# Patient Record
Sex: Male | Born: 1965
Health system: Southern US, Community
[De-identification: ages and names within clinical notes are randomized; demographics above are authoritative.]

## PROBLEM LIST (undated history)

## (undated) DIAGNOSIS — I517 Cardiomegaly: Secondary | ICD-10-CM

## (undated) DIAGNOSIS — I499 Cardiac arrhythmia, unspecified: Secondary | ICD-10-CM

## (undated) DIAGNOSIS — E785 Hyperlipidemia, unspecified: Secondary | ICD-10-CM

## (undated) DIAGNOSIS — K219 Gastro-esophageal reflux disease without esophagitis: Secondary | ICD-10-CM

## (undated) DIAGNOSIS — G4733 Obstructive sleep apnea (adult) (pediatric): Secondary | ICD-10-CM

## (undated) DIAGNOSIS — R7611 Nonspecific reaction to tuberculin skin test without active tuberculosis: Secondary | ICD-10-CM

## (undated) DIAGNOSIS — T7840XA Allergy, unspecified, initial encounter: Secondary | ICD-10-CM

## (undated) DIAGNOSIS — I10 Essential (primary) hypertension: Secondary | ICD-10-CM

## (undated) DIAGNOSIS — D649 Anemia, unspecified: Secondary | ICD-10-CM

## (undated) DIAGNOSIS — J069 Acute upper respiratory infection, unspecified: Secondary | ICD-10-CM

## (undated) DIAGNOSIS — I639 Cerebral infarction, unspecified: Secondary | ICD-10-CM

## (undated) DIAGNOSIS — E669 Obesity, unspecified: Secondary | ICD-10-CM

## (undated) DIAGNOSIS — K2960 Other gastritis without bleeding: Secondary | ICD-10-CM

## (undated) DIAGNOSIS — R51 Headache: Secondary | ICD-10-CM

## (undated) DIAGNOSIS — N289 Disorder of kidney and ureter, unspecified: Secondary | ICD-10-CM

## (undated) DIAGNOSIS — G4730 Sleep apnea, unspecified: Secondary | ICD-10-CM

## (undated) HISTORY — DX: Obesity, unspecified: E66.9

## (undated) HISTORY — PX: UPPER GASTROINTESTINAL ENDOSCOPY: SHX188

## (undated) HISTORY — DX: Gastro-esophageal reflux disease without esophagitis: K21.9

## (undated) HISTORY — DX: Acute upper respiratory infection, unspecified: J06.9

## (undated) HISTORY — DX: Obstructive sleep apnea (adult) (pediatric): G47.33

## (undated) HISTORY — DX: Disorder of kidney and ureter, unspecified: N28.9

## (undated) HISTORY — DX: Hyperlipidemia, unspecified: E78.5

## (undated) HISTORY — PX: HEMORRHOID SURGERY: SHX153

## (undated) HISTORY — DX: Other gastritis without bleeding: K29.60

## (undated) HISTORY — DX: Nonspecific reaction to tuberculin skin test without active tuberculosis: R76.11

## (undated) HISTORY — DX: Anemia, unspecified: D64.9

## (undated) HISTORY — DX: Essential (primary) hypertension: I10

## (undated) HISTORY — DX: Sleep apnea, unspecified: G47.30

## (undated) HISTORY — DX: Cardiomegaly: I51.7

## (undated) HISTORY — DX: Allergy, unspecified, initial encounter: T78.40XA

## (undated) HISTORY — PX: COLONOSCOPY: SHX174

---

## 1998-06-19 DIAGNOSIS — R7611 Nonspecific reaction to tuberculin skin test without active tuberculosis: Secondary | ICD-10-CM

## 1998-06-19 HISTORY — DX: Nonspecific reaction to tuberculin skin test without active tuberculosis: R76.11

## 2000-06-11 ENCOUNTER — Emergency Department (HOSPITAL_COMMUNITY): Admission: EM | Admit: 2000-06-11 | Discharge: 2000-06-11 | Payer: Self-pay | Admitting: Emergency Medicine

## 2000-08-06 ENCOUNTER — Emergency Department (HOSPITAL_COMMUNITY): Admission: EM | Admit: 2000-08-06 | Discharge: 2000-08-06 | Payer: Self-pay | Admitting: Emergency Medicine

## 2000-08-07 ENCOUNTER — Encounter: Payer: Self-pay | Admitting: Emergency Medicine

## 2002-03-23 ENCOUNTER — Emergency Department (HOSPITAL_COMMUNITY): Admission: EM | Admit: 2002-03-23 | Discharge: 2002-03-23 | Payer: Self-pay | Admitting: Emergency Medicine

## 2004-08-20 ENCOUNTER — Emergency Department (HOSPITAL_COMMUNITY): Admission: EM | Admit: 2004-08-20 | Discharge: 2004-08-20 | Payer: Self-pay | Admitting: Emergency Medicine

## 2005-01-25 ENCOUNTER — Encounter: Payer: Self-pay | Admitting: Internal Medicine

## 2007-11-12 ENCOUNTER — Encounter: Payer: Self-pay | Admitting: Internal Medicine

## 2007-12-13 ENCOUNTER — Ambulatory Visit: Payer: Self-pay | Admitting: Internal Medicine

## 2007-12-13 DIAGNOSIS — D509 Iron deficiency anemia, unspecified: Secondary | ICD-10-CM | POA: Insufficient documentation

## 2007-12-13 DIAGNOSIS — K648 Other hemorrhoids: Secondary | ICD-10-CM | POA: Insufficient documentation

## 2007-12-13 DIAGNOSIS — K625 Hemorrhage of anus and rectum: Secondary | ICD-10-CM | POA: Insufficient documentation

## 2007-12-13 DIAGNOSIS — K644 Residual hemorrhoidal skin tags: Secondary | ICD-10-CM | POA: Insufficient documentation

## 2007-12-17 ENCOUNTER — Telehealth: Payer: Self-pay | Admitting: Internal Medicine

## 2008-01-28 ENCOUNTER — Ambulatory Visit: Payer: Self-pay | Admitting: Internal Medicine

## 2008-03-02 ENCOUNTER — Ambulatory Visit: Payer: Self-pay | Admitting: Internal Medicine

## 2008-03-02 DIAGNOSIS — I10 Essential (primary) hypertension: Secondary | ICD-10-CM | POA: Insufficient documentation

## 2008-03-02 DIAGNOSIS — K222 Esophageal obstruction: Secondary | ICD-10-CM

## 2008-03-02 DIAGNOSIS — K219 Gastro-esophageal reflux disease without esophagitis: Secondary | ICD-10-CM

## 2010-01-31 ENCOUNTER — Ambulatory Visit: Payer: Self-pay | Admitting: Hematology & Oncology

## 2010-06-18 ENCOUNTER — Emergency Department (HOSPITAL_COMMUNITY)
Admission: EM | Admit: 2010-06-18 | Discharge: 2010-06-19 | Payer: Self-pay | Source: Home / Self Care | Admitting: Emergency Medicine

## 2010-08-29 LAB — POCT I-STAT, CHEM 8
BUN: 10 mg/dL (ref 6–23)
Calcium, Ion: 1.17 mmol/L (ref 1.12–1.32)
Chloride: 103 mEq/L (ref 96–112)
Creatinine, Ser: 1.5 mg/dL (ref 0.4–1.5)
Glucose, Bld: 91 mg/dL (ref 70–99)
HCT: 38 % — ABNORMAL LOW (ref 39.0–52.0)
Hemoglobin: 12.9 g/dL — ABNORMAL LOW (ref 13.0–17.0)
Potassium: 3.8 mEq/L (ref 3.5–5.1)
Sodium: 138 mEq/L (ref 135–145)
TCO2: 30 mmol/L (ref 0–100)

## 2010-11-04 ENCOUNTER — Emergency Department (HOSPITAL_COMMUNITY)
Admission: EM | Admit: 2010-11-04 | Discharge: 2010-11-05 | Disposition: A | Discharge status: Home / Self Care | Payer: BC Managed Care – PPO | Attending: Emergency Medicine | Admitting: Emergency Medicine

## 2010-11-04 DIAGNOSIS — M549 Dorsalgia, unspecified: Secondary | ICD-10-CM | POA: Insufficient documentation

## 2010-11-04 DIAGNOSIS — I10 Essential (primary) hypertension: Secondary | ICD-10-CM | POA: Insufficient documentation

## 2010-11-04 LAB — URINALYSIS, ROUTINE W REFLEX MICROSCOPIC
Bilirubin Urine: NEGATIVE
Hgb urine dipstick: NEGATIVE
Protein, ur: NEGATIVE mg/dL
Urobilinogen, UA: 0.2 mg/dL (ref 0.0–1.0)

## 2011-07-12 ENCOUNTER — Inpatient Hospital Stay: Admit: 2011-07-12 | Payer: Self-pay | Admitting: Otolaryngology

## 2011-07-12 SURGERY — UPPP (UVULOPALATOPHARYNGOPLASTY)
Anesthesia: General

## 2011-08-25 ENCOUNTER — Ambulatory Visit (INDEPENDENT_AMBULATORY_CARE_PROVIDER_SITE_OTHER): Payer: 59 | Admitting: Family Medicine

## 2011-08-25 VITALS — BP 173/92 | HR 51 | Temp 98.3°F | Resp 18 | Ht 71.75 in | Wt 254.8 lb

## 2011-08-25 DIAGNOSIS — D649 Anemia, unspecified: Secondary | ICD-10-CM

## 2011-08-25 DIAGNOSIS — Q649 Congenital malformation of urinary system, unspecified: Secondary | ICD-10-CM

## 2011-08-25 DIAGNOSIS — E785 Hyperlipidemia, unspecified: Secondary | ICD-10-CM

## 2011-08-25 DIAGNOSIS — I10 Essential (primary) hypertension: Secondary | ICD-10-CM

## 2011-08-25 LAB — POCT URINALYSIS DIPSTICK
Bilirubin, UA: NEGATIVE
Blood, UA: NEGATIVE
Glucose, UA: NEGATIVE
Ketones, UA: NEGATIVE
Leukocytes, UA: NEGATIVE
Nitrite, UA: NEGATIVE
Protein, UA: NEGATIVE
Spec Grav, UA: 1.015
Urobilinogen, UA: 0.2
pH, UA: 7

## 2011-08-25 LAB — COMPREHENSIVE METABOLIC PANEL WITH GFR
Albumin: 4.4 g/dL (ref 3.5–5.2)
Alkaline Phosphatase: 47 U/L (ref 39–117)
BUN: 15 mg/dL (ref 6–23)
CO2: 32 meq/L (ref 19–32)
Calcium: 10.6 mg/dL — ABNORMAL HIGH (ref 8.4–10.5)
Chloride: 100 meq/L (ref 96–112)
Glucose, Bld: 93 mg/dL (ref 70–99)
Potassium: 4.9 meq/L (ref 3.5–5.3)
Sodium: 137 meq/L (ref 135–145)
Total Protein: 7.4 g/dL (ref 6.0–8.3)

## 2011-08-25 LAB — COMPREHENSIVE METABOLIC PANEL
ALT: 17 U/L (ref 0–53)
AST: 20 U/L (ref 0–37)
Creat: 1.45 mg/dL — ABNORMAL HIGH (ref 0.50–1.35)
Total Bilirubin: 0.6 mg/dL (ref 0.3–1.2)

## 2011-08-25 LAB — POCT UA - MICROSCOPIC ONLY
Bacteria, U Microscopic: NEGATIVE
Casts, Ur, LPF, POC: NEGATIVE
Crystals, Ur, HPF, POC: NEGATIVE
Mucus, UA: NEGATIVE
WBC, Ur, HPF, POC: NEGATIVE
Yeast, UA: NEGATIVE

## 2011-08-25 LAB — LIPID PANEL
Cholesterol: 214 mg/dL — ABNORMAL HIGH (ref 0–200)
HDL: 45 mg/dL (ref >39)
LDL Cholesterol: 133 mg/dL — ABNORMAL HIGH (ref 0–99)
Total CHOL/HDL Ratio: 4.8 Ratio
Triglycerides: 178 mg/dL — ABNORMAL HIGH (ref <150)
VLDL: 36 mg/dL (ref 0–40)

## 2011-08-25 MED ORDER — HYDROCHLOROTHIAZIDE 12.5 MG PO CAPS: 25.0000 mg | ORAL_CAPSULE | Freq: Every day | ORAL | Status: DC

## 2011-08-25 MED ORDER — METOPROLOL TARTRATE 25 MG PO TABS: 100.0000 mg | ORAL_TABLET | Freq: Two times a day (BID) | ORAL | Status: DC

## 2011-08-25 NOTE — Progress Notes (Signed)
[facility]:  Office Visit  Chief Complaint:  Chief Complaint  Patient presents with  . Medication Check    Pt feels HTN medication is not working    HPI: Raymond Green is a 46 y.o. male who complains of  Medication refills/check. Patient thinks he is on Metoprolol 25 mg  And HCTZ 12.5 mg daily. BP at home is 150/100-110. No SEs.  He also here b/c urine has had foam in it and has bee bright yelllow color in last 1 week. NO UTI sxs.   Past Medical History  Diagnosis Date  . Hypertension   . OSA (obstructive sleep apnea)   . Anemia   . Positive PPD, treated 2000    INH  . Obesity   . Hemorrhoids   . LVH (left ventricular hypertrophy)     Dr. Donnie Aho ( cardiology)  . Kidney insufficiency   . Vitamin d deficiency    History reviewed. No pertinent past surgical history. History   Social History  . Marital Status: Legally Separated    Spouse Name: N/A    Number of Children: N/A  . Years of Education: N/A   Social History Main Topics  . Smoking status: Never Smoker   . Smokeless tobacco: None  . Alcohol Use: Yes     Sometimes  . Drug Use: No  . Sexually Active: Yes   Other Topics Concern  . None   Social History Narrative  . None   Family History  Problem Relation Age of Onset  . Diabetes Mother   . Heart disease Mother   . Hypertension Mother    Allergies  Allergen Reactions  . Lisinopril    Prior to Admission medications   Medication Sig Start Date End Date Taking? Authorizing Provider  calcium-vitamin D 250-100 MG-UNIT per tablet Take 1 tablet by mouth 2 (two) times daily.   Yes Historical Provider, MD  docusate sodium (COLACE) 100 MG capsule Take 100 mg by mouth 2 (two) times daily.   Yes Historical Provider, MD  ferrous gluconate (FERGON) 325 MG tablet Take 325 mg by mouth daily with breakfast.   Yes Historical Provider, MD  hydrochlorothiazide (MICROZIDE) 12.5 MG capsule Take 12.5 mg by mouth daily.   Yes Historical Provider, MD    metoprolol tartrate (LOPRESSOR) 25 MG tablet Take 25 mg by mouth 2 (two) times daily.   Yes Historical Provider, MD     ROS: The patient denies fevers, chills, night sweats, unintentional weight loss, chest pain, palpitations, wheezing, dyspnea on exertion, nausea, vomiting, abdominal pain, dysuria, hematuria, melena, numbness, weakness, or tingling.   All other systems have been reviewed and were otherwise negative with the exception of those mentioned in the HPI and as above.    PHYSICAL EXAM: Filed Vitals:   08/25/11 1010  BP: 173/92  Pulse: 51  Temp: 98.3 F (36.8 C)  Resp: 18   Filed Vitals:   08/25/11 1010  Height: 5' 11.75" (1.822 m)  Weight: 254 lb 12.8 oz (115.577 kg)   Body mass index is 34.80 kg/(m^2).  General: Alert, no acute distress HEENT:  Normocephalic, atraumatic, oropharynx patent.  Cardiovascular:  Regular rate and rhythm, no rubs murmurs or gallops.  No Carotid bruits, radial pulse intact. No pedal edema.  Respiratory: Clear to auscultation bilaterally.  No wheezes, rales, or rhonchi.  No cyanosis, no use of accessory musculature GI: No organomegaly, abdomen is soft and non-tender, positive bowel sounds.  No masses. Skin: No rashes. Neurologic: Facial musculature  symmetric. Psychiatric: Patient is appropriate throughout our interaction. Lymphatic: No cervical lymphadenopathy Musculoskeletal: Gait intact. No CVA tenderness   LABS: Results for orders placed in visit on 08/25/11  POCT URINALYSIS DIPSTICK      Component Value Range   Color, UA yellow     Clarity, UA clear     Glucose, UA negative     Bilirubin, UA negative     Ketones, UA negative     Spec Grav, UA 1.015     Blood, UA negative     pH, UA 7.0     Protein, UA negative     Urobilinogen, UA 0.2     Nitrite, UA negative     Leukocytes, UA Negative    POCT UA - MICROSCOPIC ONLY      Component Value Range   WBC, Ur, HPF, POC negative     RBC, urine, microscopic rare     Bacteria, U  Microscopic negative     Mucus, UA negative     Epithelial cells, urine per micros 0-1     Crystals, Ur, HPF, POC negative     Casts, Ur, LPF, POC negative     Yeast, UA negative       EKG/XRAY:   Primary read interpreted by Dr. Conley Rolls at [facility]   ASSESSMENT/PLAN: Encounter Diagnoses  Name Primary?  . HTN (hypertension) Yes  . Hyperlipidemia   . Urinary anomaly   . Anemia    Changed HTN meds to Metroprolo ER 100 mg BID and also refilled HCTZ 25 mg daily.  UA was normal. Lipid pending.  Check BP and pulse next 2weeks. IF > 140/90 need to f/u sooner. Otherwise f/u in 3 months    Jasmia Angst PHUONG, DO 08/25/2011 11:46 AM

## 2011-08-31 ENCOUNTER — Other Ambulatory Visit: Payer: Self-pay | Admitting: *Deleted

## 2011-08-31 MED ORDER — HYDROCHLOROTHIAZIDE 25 MG PO TABS: 25.0000 mg | ORAL_TABLET | Freq: Every day | ORAL | Status: DC

## 2011-08-31 MED ORDER — METOPROLOL SUCCINATE ER 100 MG PO TB24: 100.0000 mg | ORAL_TABLET | Freq: Every day | ORAL | Status: DC

## 2011-09-27 ENCOUNTER — Encounter (HOSPITAL_COMMUNITY)
Admission: RE | Admit: 2011-09-27 | Discharge: 2011-09-27 | Disposition: A | Discharge status: Home / Self Care | Payer: 59 | Source: Ambulatory Visit | Attending: Anesthesiology | Admitting: Anesthesiology

## 2011-09-27 ENCOUNTER — Encounter (HOSPITAL_COMMUNITY): Payer: Self-pay

## 2011-09-27 ENCOUNTER — Encounter (HOSPITAL_COMMUNITY)
Admission: RE | Admit: 2011-09-27 | Discharge: 2011-09-27 | Disposition: A | Discharge status: Home / Self Care | Payer: 59 | Source: Ambulatory Visit | Attending: Otolaryngology | Admitting: Otolaryngology

## 2011-09-27 ENCOUNTER — Other Ambulatory Visit: Payer: Self-pay | Admitting: Otolaryngology

## 2011-09-27 DIAGNOSIS — Z01810 Encounter for preprocedural cardiovascular examination: Secondary | ICD-10-CM | POA: Insufficient documentation

## 2011-09-27 DIAGNOSIS — Z01812 Encounter for preprocedural laboratory examination: Secondary | ICD-10-CM | POA: Insufficient documentation

## 2011-09-27 DIAGNOSIS — Z538 Procedure and treatment not carried out for other reasons: Secondary | ICD-10-CM | POA: Insufficient documentation

## 2011-09-27 DIAGNOSIS — Z01818 Encounter for other preprocedural examination: Secondary | ICD-10-CM | POA: Insufficient documentation

## 2011-09-27 HISTORY — DX: Headache: R51

## 2011-09-27 LAB — CBC
MCHC: 34.2 g/dL (ref 30.0–36.0)
MCV: 87.5 fL (ref 78.0–100.0)
Platelets: 168 10*3/uL (ref 150–400)
RDW: 12.8 % (ref 11.5–15.5)
WBC: 4.7 10*3/uL (ref 4.0–10.5)

## 2011-09-27 LAB — SURGICAL PCR SCREEN
MRSA, PCR: NEGATIVE
Staphylococcus aureus: NEGATIVE

## 2011-09-27 LAB — BASIC METABOLIC PANEL
CO2: 30 mEq/L (ref 19–32)
Calcium: 10 mg/dL (ref 8.4–10.5)
Creatinine, Ser: 1.46 mg/dL — ABNORMAL HIGH (ref 0.50–1.35)
GFR calc Af Amer: 65 mL/min — ABNORMAL LOW (ref >90)

## 2011-09-27 NOTE — Pre-Procedure Instructions (Addendum)
20 Raymond Green  09/27/2011   Your procedure is scheduled on:  October 04, 2011 (WED)  Report to [facility] at 6:30 AM.  Call this number if you have problems the morning of surgery: [phone]   Remember:   Do not eat food:After Midnight.  May have clear liquids: up to 4 Hours before arrival.  Clear liquids include soda, tea, black coffee, apple or grape juice, broth.  Take these medicines the morning of surgery with A SIP OF WATER: TOPROL 100MG , LOPRESSOR 25MG    Do not wear jewelry, make-up or nail polish.  Do not wear lotions, powders, or perfumes. You may wear deodorant.  Do not shave 48 hours prior to surgery.  Do not bring valuables to the hospital.  Contacts, dentures or bridgework may not be worn into surgery.  Leave suitcase in the car. After surgery it may be brought to your room.  For patients admitted to the hospital, checkout time is 11:00 AM the day of discharge.   Patients discharged the day of surgery will not be allowed to drive home.  Name and phone number of your driver: [name] [phone], [name] [phone]  Special Instructions: CHG Shower Use Special Wash: 1/2 bottle night before surgery and 1/2 bottle morning of surgery.   Please read over the following fact sheets that you were given: Pain Booklet, MRSA Information and Surgical Site Infection Prevention

## 2011-09-27 NOTE — Progress Notes (Addendum)
Called for orders, nurse not in until 0830 or 0900 per April in office, PAT @ 0800.  Labs drawn per anesthesia

## 2011-09-29 ENCOUNTER — Encounter (HOSPITAL_COMMUNITY): Payer: Self-pay | Admitting: Vascular Surgery

## 2011-09-29 NOTE — Consult Note (Addendum)
Anesthesia:  Patient is a 46 year old male posted for a UPPP, septoplasty, tonsillectomy on 10/04/11.  History includes OSA, HTN, anemia, non-smoker, +PDD s/p treatment, CRI, LVH, headaches.  In Epic, his medical visit was with Dr. Hamilton Capri on 3//03/13 for HTN follow-up.  His BP was 173/92 then and his medications were adjusted.  He was instructed to follow-up if his BP were running > 140/90.  His BP at PAT on 09/27/11 was 158/104.    Medications listed are Ca with VIt D, Colace, Fergon, HCTZ 25 mg daily, and metoprolol 100 mg BID.  EKG on 09/27/11 shows SB at 55 bpm, LVH with repolarization abnormality showing T wave inversion in inferior-lateral leads.  The interpreting Cardiologist felt it was not significantly changes, but I do not see any previous EKGs in [place] or Epic.   He was last evaluated by Dr. Viann Fish on 07/22/09 for HTN.  His EKG there done on 06/25/08 looks similar to his EKG done on 09/27/11.  Echo on 07/08/08 showed: Concentric LVH, EF 45-50%, LV function mildly depressed, LA mildly dilated, mild MR/TR.    CXR on 09/27/11 showed no acute cardiopulmonary disease.   Labs noted.  Cr 1.46 (was 1.45 on 08/25/11), CBC WNL.  Glucose 101.  I have left a message for him on his primary phone to call me.  I think he needs to be reevaluated by his PCP preoperatively since his BP appears to still not be well controlled.   Addendum:  09/29/11 1200    I have still not heard from Raymond Green.  He was not at work today.  I did review his Cardiac records and EKG with Anesthesiologist Dr. Jean Rosenthal.  If he is asymptomatic, then she does not feel he would need Cardiac evaluation preoperatively for this procedure.  He will need better BP control prior to proceeding, however.  Once I speak with him, hopefully, he can arrange to see his PCP early next week. Mandy at Dr. Thurmon Fair office updated.   Addendum: 09/29/11 1830  I was finally able to reach Raymond Green.  He reports his PCP is Dr. Robert Bellow, and had  [facility]s [facility].  He will call his PCP office on Monday regarding having his BP re-evaluated.  Of note, he did say that he had forgotten to take his BP medications prior to his PAT appointment.  Addendum: 10/03/11 1045  Left a message for Raymond Green to call me with an update.  However, noted he was seen by a Dr. Milus Glazier at [facility] on 09/30/11.  BP then was 158/94.  His HR was bradycardic at 49.  His BP regimen was changed to Bystolic 20mg  daily.  As before, he had not taken any antihypertensive meds before his PAT appointment, and his follow-up BP on 09/30/11 was reasonable. Dr. Milus Glazier also provided medical clearance.  Anticipate he can proceed.  I updated Mandy at Dr. Thurmon Fair office.

## 2011-09-30 ENCOUNTER — Ambulatory Visit (INDEPENDENT_AMBULATORY_CARE_PROVIDER_SITE_OTHER): Payer: 59 | Admitting: Family Medicine

## 2011-09-30 VITALS — BP 158/94 | HR 49 | Temp 98.2°F | Resp 16 | Ht 72.0 in | Wt 260.0 lb

## 2011-09-30 DIAGNOSIS — Z01818 Encounter for other preprocedural examination: Secondary | ICD-10-CM

## 2011-09-30 DIAGNOSIS — I10 Essential (primary) hypertension: Secondary | ICD-10-CM

## 2011-09-30 MED ORDER — NEBIVOLOL HCL 20 MG PO TABS: 20.0000 mg | ORAL_TABLET | Freq: Every morning | ORAL | Status: DC

## 2011-09-30 NOTE — Progress Notes (Addendum)
46 yo [employer] employee who works with Dr. Leone Payor and plans tonsillectomy with Dr. Annalee Genta.  No active problems, but blood pressure never was controlled adequately with Metoprolol which we tried because of price.  ROS:  No headache, vision prob, sinus congestion, active sorethroat, dysphagia, neck pain, chest pain or SOB, abdominal pain, back pain, difficulty passing water, weakness.  PMHx:  S/P hemorrhoidectomy   No allergies   Hypertension  O:  NAD, friendly and cooperative, alert and showing good judgment HEENT:  Enlarged tonsils, nosae patent, ears normal Neck supple no adenopathy Chest:  Clear Heart: reg, no murmur or gallop Abd:  Soft, no HSM, nontender, no masses Ext: no edema, FROM x 4 extrem, gait normal Results for orders placed during the hospital encounter of 09/27/11  SURGICAL PCR SCREEN      Component Value Range   MRSA, PCR NEGATIVE  NEGATIVE    Staphylococcus aureus NEGATIVE  NEGATIVE   BASIC METABOLIC PANEL      Component Value Range   Sodium 142  135 - 145 (mEq/L)   Potassium 3.8  3.5 - 5.1 (mEq/L)   Chloride 104  96 - 112 (mEq/L)   CO2 30  19 - 32 (mEq/L)   Glucose, Bld 101 (*) 70 - 99 (mg/dL)   BUN 16  6 - 23 (mg/dL)   Creatinine, Ser 1.61 (*) 0.50 - 1.35 (mg/dL)   Calcium 09.6  8.4 - 10.5 (mg/dL)   GFR calc non Af Amer 56 (*) >90 (mL/min)   GFR calc Af Amer 65 (*) >90 (mL/min)  CBC      Component Value Range   WBC 4.7  4.0 - 10.5 (K/uL)   RBC 4.65  4.22 - 5.81 (MIL/uL)   Hemoglobin 13.9  13.0 - 17.0 (g/dL)   HCT 04.5  40.9 - 81.1 (%)   MCV 87.5  78.0 - 100.0 (fL)   MCH 29.9  26.0 - 34.0 (pg)   MCHC 34.2  30.0 - 36.0 (g/dL)   RDW 91.4  78.2 - 95.6 (%)   Platelets 168  150 - 400 (K/uL)     A: cleared for surgery Slightly elevated creatinine of uncertain significance which we will check in 6 months  P: switch to bystolic 20 qd

## 2011-10-03 MED ORDER — DEXAMETHASONE SODIUM PHOSPHATE 10 MG/ML IJ SOLN: 10.0000 mg | Freq: Once | INTRAMUSCULAR | Status: DC

## 2011-10-03 MED ORDER — CEFAZOLIN SODIUM-DEXTROSE 2-3 GM-% IV SOLR: 2.0000 g | INTRAVENOUS | Status: DC

## 2011-10-04 ENCOUNTER — Encounter (HOSPITAL_COMMUNITY): Admission: RE | Payer: Self-pay | Source: Ambulatory Visit

## 2011-10-04 ENCOUNTER — Ambulatory Visit (HOSPITAL_COMMUNITY): Admission: RE | Admit: 2011-10-04 | Payer: 59 | Source: Ambulatory Visit | Admitting: Otolaryngology

## 2011-10-04 SURGERY — UPPP (UVULOPALATOPHARYNGOPLASTY)
Anesthesia: General

## 2011-11-20 ENCOUNTER — Ambulatory Visit (INDEPENDENT_AMBULATORY_CARE_PROVIDER_SITE_OTHER): Payer: 59 | Admitting: Family Medicine

## 2011-11-20 ENCOUNTER — Ambulatory Visit: Payer: 59 | Admitting: Internal Medicine

## 2011-11-20 VITALS — BP 166/103 | HR 64 | Temp 99.0°F | Resp 16 | Ht 71.5 in | Wt 263.0 lb

## 2011-11-20 DIAGNOSIS — M109 Gout, unspecified: Secondary | ICD-10-CM

## 2011-11-20 DIAGNOSIS — H109 Unspecified conjunctivitis: Secondary | ICD-10-CM

## 2011-11-20 DIAGNOSIS — I10 Essential (primary) hypertension: Secondary | ICD-10-CM

## 2011-11-20 MED ORDER — INDOMETHACIN ER 75 MG PO CPCR: 75.0000 mg | ORAL_CAPSULE | Freq: Two times a day (BID) | ORAL | Status: AC

## 2011-11-20 MED ORDER — TOBRAMYCIN 0.3 % OP SOLN: 1.0000 [drp] | OPHTHALMIC | Status: AC

## 2011-11-20 MED ORDER — AMLODIPINE BESYLATE 10 MG PO TABS: 10.0000 mg | ORAL_TABLET | Freq: Every day | ORAL | Status: DC

## 2011-11-20 NOTE — Progress Notes (Signed)
Is a 46 year old gentleman who works at: With Dr. Leone Payor. He comes in with several problems: High blood pressure, left great toe pain, and right pink eye.  He's had high blood pressure for a while and he thinks that the hydrochlorothiazide is drying his mouth out. He continues to take his bystolic.  His left great toe pain cervical last couple days it has gotten worse. Swollen and tender to touch or walk on. He has no history of gout.  He has 2 days of right sticky red eye. He has no change in his vision but he does have some mild discharge or film over his eye. Objective: No acute distress-patient has antalgic gait. Left toe: Swollen tender left great toe and TP joint. Right eye: Normal fundus, normal EOM: Physical and reactive: Injected right sclera with scant exudate at the lid margins of the right eye. Blood pressure recheck 160/110, chest clear, heart regular no murmur or gallop. There is no edema of the extremities.  Assessment: Conjunctivitis, gout, uncontrolled blood pressure  Plan: Check uric acid and metabolic profile Add Norvasc and stop the hydrochlorothiazide for blood pressure Indocin for gout.

## 2011-11-20 NOTE — Patient Instructions (Signed)
Gout Gout is an inflammatory condition (arthritis) caused by a buildup of uric acid crystals in the joints. Uric acid is a chemical that is normally present in the blood. Under some circumstances, uric acid can form into crystals in your joints. This causes joint redness, soreness, and swelling (inflammation). Repeat attacks are common. Over time, uric acid crystals can form into masses (tophi) near a joint, causing disfigurement. Gout is treatable and often preventable. CAUSES  The disease begins with elevated levels of uric acid in the blood. Uric acid is produced by your body when it breaks down a naturally found substance called purines. This also happens when you eat certain foods such as meats and fish. Causes of an elevated uric acid level include:  Being passed down from parent to child (heredity).   Diseases that cause increased uric acid production (obesity, psoriasis, some cancers).   Excessive alcohol use.   Diet, especially diets rich in meat and seafood.   Medicines, including certain cancer-fighting drugs (chemotherapy), diuretics, and aspirin.   Chronic kidney disease. The kidneys are no longer able to remove uric acid well.   Problems with metabolism.  Conditions strongly associated with gout include:  Obesity.   High blood pressure.   High cholesterol.   Diabetes.  Not everyone with elevated uric acid levels gets gout. It is not understood why some people get gout and others do not. Surgery, joint injury, and eating too much of certain foods are some of the factors that can lead to gout. SYMPTOMS   An attack of gout comes on quickly. It causes intense pain with redness, swelling, and warmth in a joint.   Fever can occur.   Often, only one joint is involved. Certain joints are more commonly involved:   Base of the big toe.   Knee.   Ankle.   Wrist.   Finger.  Without treatment, an attack usually goes away in a few days to weeks. Between attacks, you  usually will not have symptoms, which is different from many other forms of arthritis. DIAGNOSIS  Your caregiver will suspect gout based on your symptoms and exam. Removal of fluid from the joint (arthrocentesis) is done to check for uric acid crystals. Your caregiver will give you a medicine that numbs the area (local anesthetic) and use a needle to remove joint fluid for exam. Gout is confirmed when uric acid crystals are seen in joint fluid, using a special microscope. Sometimes, blood, urine, and X-ray tests are also used. TREATMENT  There are 2 phases to gout treatment: treating the sudden onset (acute) attack and preventing attacks (prophylaxis). Treatment of an Acute Attack  Medicines are used. These include anti-inflammatory medicines or steroid medicines.   An injection of steroid medicine into the affected joint is sometimes necessary.   The painful joint is rested. Movement can worsen the arthritis.   You may use warm or cold treatments on painful joints, depending which works best for you.   Discuss the use of coffee, vitamin C, or cherries with your caregiver. These may be helpful treatment options.  Treatment to Prevent Attacks After the acute attack subsides, your caregiver may advise prophylactic medicine. These medicines either help your kidneys eliminate uric acid from your body or decrease your uric acid production. You may need to stay on these medicines for a very long time. The early phase of treatment with prophylactic medicine can be associated with an increase in acute gout attacks. For this reason, during the first few months   of treatment, your caregiver may also advise you to take medicines usually used for acute gout treatment. Be sure you understand your caregiver's directions. You should also discuss dietary treatment with your caregiver. Certain foods such as meats and fish can increase uric acid levels. Other foods such as dairy can decrease levels. Your caregiver  can give you a list of foods to avoid. HOME CARE INSTRUCTIONS   Do not take aspirin to relieve pain. This raises uric acid levels.   Only take over-the-counter or prescription medicines for pain, discomfort, or fever as directed by your caregiver.   Rest the joint as much as possible. When in bed, keep sheets and blankets off painful areas.   Keep the affected joint raised (elevated).   Use crutches if the painful joint is in your leg.   Drink enough water and fluids to keep your urine clear or pale yellow. This helps your body get rid of uric acid. Do not drink alcoholic beverages. They slow the passage of uric acid.   Follow your caregiver's dietary instructions. Pay careful attention to the amount of protein you eat. Your daily diet should emphasize fruits, vegetables, whole grains, and fat-free or low-fat milk products.   Maintain a healthy body weight.  SEEK MEDICAL CARE IF:   You have an oral temperature above 102 F (38.9 C).   You develop diarrhea, vomiting, or any side effects from medicines.   You do not feel better in 24 hours, or you are getting worse.  SEEK IMMEDIATE MEDICAL CARE IF:   Your joint becomes suddenly more tender and you have:   Chills.   An oral temperature above 102 F (38.9 C), not controlled by medicine.  MAKE SURE YOU:   Understand these instructions.   Will watch your condition.   Will get help right away if you are not doing well or get worse.  Document Released: 06/02/2000 Document Revised: 05/25/2011 Document Reviewed: 09/13/2009 Exit[employer] Patient Information 2012 [employer]. 

## 2011-11-21 LAB — COMPREHENSIVE METABOLIC PANEL
ALT: 41 U/L (ref 0–53)
AST: 35 U/L (ref 0–37)
Albumin: 4 g/dL (ref 3.5–5.2)
Alkaline Phosphatase: 60 U/L (ref 39–117)
BUN: 16 mg/dL (ref 6–23)
CO2: 28 mEq/L (ref 19–32)
Calcium: 9.7 mg/dL (ref 8.4–10.5)
Chloride: 105 mEq/L (ref 96–112)
Creat: 1.29 mg/dL (ref 0.50–1.35)
Glucose, Bld: 87 mg/dL (ref 70–99)
Potassium: 4.5 mEq/L (ref 3.5–5.3)
Sodium: 140 mEq/L (ref 135–145)
Total Bilirubin: 0.6 mg/dL (ref 0.3–1.2)
Total Protein: 7.4 g/dL (ref 6.0–8.3)

## 2011-11-21 LAB — URIC ACID: Uric Acid, Serum: 8.3 mg/dL — ABNORMAL HIGH (ref 4.0–7.8)

## 2011-11-29 ENCOUNTER — Telehealth: Payer: Self-pay

## 2011-11-29 DIAGNOSIS — K625 Hemorrhage of anus and rectum: Secondary | ICD-10-CM

## 2011-11-29 NOTE — Telephone Encounter (Signed)
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  Pt wants early morning case.

## 2011-11-29 NOTE — Telephone Encounter (Signed)
Left message on machine to call back  

## 2011-11-30 ENCOUNTER — Other Ambulatory Visit: Payer: Self-pay

## 2011-11-30 MED ORDER — MOVIPREP 100 G PO SOLR: 1.0000 | ORAL | Status: DC

## 2011-11-30 NOTE — Telephone Encounter (Signed)
Pt has been notified and instructed, he was also mailed a copy of his instructions.  The prep was sent to the [facility] pharmacy in error and was resent to [facility].  I called  and cx the rx

## 2011-12-27 ENCOUNTER — Encounter (HOSPITAL_COMMUNITY): Payer: Self-pay

## 2011-12-28 ENCOUNTER — Encounter (HOSPITAL_COMMUNITY): Payer: Self-pay | Admitting: *Deleted

## 2011-12-28 ENCOUNTER — Ambulatory Visit (HOSPITAL_COMMUNITY)
Admission: RE | Admit: 2011-12-28 | Discharge: 2011-12-28 | Disposition: A | Discharge status: Home / Self Care | Payer: 59 | Source: Ambulatory Visit | Attending: Gastroenterology | Admitting: Gastroenterology

## 2011-12-28 ENCOUNTER — Encounter (HOSPITAL_COMMUNITY)
Admission: RE | Disposition: A | Discharge status: Home / Self Care | Payer: Self-pay | Source: Ambulatory Visit | Attending: Gastroenterology

## 2011-12-28 DIAGNOSIS — E669 Obesity, unspecified: Secondary | ICD-10-CM | POA: Insufficient documentation

## 2011-12-28 DIAGNOSIS — G4733 Obstructive sleep apnea (adult) (pediatric): Secondary | ICD-10-CM | POA: Insufficient documentation

## 2011-12-28 DIAGNOSIS — K644 Residual hemorrhoidal skin tags: Secondary | ICD-10-CM | POA: Insufficient documentation

## 2011-12-28 DIAGNOSIS — K625 Hemorrhage of anus and rectum: Secondary | ICD-10-CM | POA: Insufficient documentation

## 2011-12-28 DIAGNOSIS — I10 Essential (primary) hypertension: Secondary | ICD-10-CM | POA: Insufficient documentation

## 2011-12-28 HISTORY — PX: COLONOSCOPY: SHX5424

## 2011-12-28 SURGERY — COLONOSCOPY
Anesthesia: Moderate Sedation

## 2011-12-28 MED ORDER — SODIUM CHLORIDE 0.9 % IV SOLN
INTRAVENOUS | Status: DC
  Administered 2011-12-28: 500 mL via INTRAVENOUS

## 2011-12-28 MED ORDER — FENTANYL CITRATE 0.05 MG/ML IJ SOLN
INTRAMUSCULAR | Status: AC
  Filled 2011-12-28: qty 2

## 2011-12-28 MED ORDER — MIDAZOLAM HCL 10 MG/2ML IJ SOLN
INTRAMUSCULAR | Status: AC
  Filled 2011-12-28: qty 2

## 2011-12-28 MED ORDER — MIDAZOLAM HCL 5 MG/5ML IJ SOLN
INTRAMUSCULAR | Status: DC | PRN
  Administered 2011-12-28: 2 mg via INTRAVENOUS

## 2011-12-28 MED ORDER — FENTANYL CITRATE 0.05 MG/ML IJ SOLN
INTRAMUSCULAR | Status: DC | PRN
  Administered 2011-12-28: 25 ug via INTRAVENOUS

## 2011-12-28 NOTE — H&P (Signed)
  HPI: This is a man with recent rectal bleeding,  Had colonsocopy 2006 was normal    Past Medical History  Diagnosis Date  . Hypertension   . OSA (obstructive sleep apnea)   . Anemia   . Positive PPD, treated 2000    INH  . Obesity   . Hemorrhoids   . LVH (left ventricular hypertrophy)     Dr. Donnie Aho ( cardiology)  . Kidney insufficiency   . Vitamin d deficiency   . Headache     Past Surgical History  Procedure Date  . Hemorrhoid surgery     No current facility-administered medications for this encounter.    Allergies as of 11/30/2011 - Review Complete 11/20/2011  Allergen Reaction Noted  . Lisinopril  08/25/2011    Family History  Problem Relation Age of Onset  . Diabetes Mother   . Heart disease Mother   . Hypertension Mother     History   Social History  . Marital Status: Legally Separated    Spouse Name: N/A    Number of Children: N/A  . Years of Education: N/A   Occupational History  . Not on file.   Social History Main Topics  . Smoking status: Never Smoker   . Smokeless tobacco: Not on file  . Alcohol Use: Yes     Sometimes  . Drug Use: No  . Sexually Active: Yes   Other Topics Concern  . Not on file   Social History Narrative  . No narrative on file      Physical Exam: There were no vitals taken for this visit. Constitutional: generally well-appearing Psychiatric: alert and oriented x3 Abdomen: soft, nontender, nondistended, no obvious ascites, no peritoneal signs, normal bowel sounds     Assessment and plan: 46 y.o. male with normal 2006 colonoscopy, new rectal bleeding  colonsocoyp today

## 2011-12-28 NOTE — Op Note (Signed)
[facility] [address] [place], [place]  [address]  COLONOSCOPY PROCEDURE REPORT  PATIENT:  Raymond Green, Corp  MR#:  [id number] BIRTHDATE:  03-17-1966, 45 yrs. old  GENDER:  male ENDOSCOPIST:  [name], MD PROCEDURE DATE:  12/28/2011 PROCEDURE:  Colonoscopy [address] ASA CLASS:  Class II INDICATIONS:  minor rectal bleeding MEDICATIONS:   Fentanyl 25 mcg IV, Versed 2 mg IV  DESCRIPTION OF PROCEDURE:   After the risks benefits and alternatives of the procedure were thoroughly explained, informed consent was obtained.  Digital rectal exam was performed and revealed no abnormalities.   The Pentax Colonoscope [id number] endoscope was introduced through the anus and advanced to the cecum, which was identified by both the appendix and ileocecal valve, without limitations.  The quality of the prep was good.. The instrument was then slowly withdrawn as the colon was fully examined.<<PROCEDUREIMAGES>> FINDINGS:  A normal appearing cecum, ileocecal valve, and appendiceal orifice were identified. The ascending, hepatic flexure, transverse, splenic flexure, descending, sigmoid colon, and rectum appeared unremarkable (see image1 and image2). External Hemorrhoids were found.   Retroflexed views in the rectum revealed no abnormalities. COMPLICATIONS:  None  ENDOSCOPIC IMPRESSION: 1) Normal colon; no polyps or cancers 2) Small external hemorrhoid  RECOMMENDATIONS: 1) You should continue to follow colorectal cancer screening guidelines for "routine risk" patients with a repeat colonoscopy in 10 years. There is no need for FOBT (stool) testing for at least 5 years. 2) Use over the counter Preparation H as needed for hemorhoidal problems.  REPEAT EXAM:  10 years  ______________________________ [name], MD  n. eSIGNED:   [name] at 12/28/2011 09:58 AM  Kandice Robinsons, [phone]

## 2011-12-28 NOTE — Discharge Instructions (Signed)

## 2011-12-29 ENCOUNTER — Encounter (HOSPITAL_COMMUNITY): Payer: Self-pay

## 2011-12-31 ENCOUNTER — Encounter (HOSPITAL_COMMUNITY): Payer: Self-pay | Admitting: Gastroenterology

## 2012-11-04 ENCOUNTER — Ambulatory Visit (INDEPENDENT_AMBULATORY_CARE_PROVIDER_SITE_OTHER): Payer: 59 | Admitting: Emergency Medicine

## 2012-11-04 VITALS — BP 190/115 | HR 77 | Temp 98.7°F | Resp 18 | Wt 264.0 lb

## 2012-11-04 DIAGNOSIS — M5431 Sciatica, right side: Secondary | ICD-10-CM

## 2012-11-04 DIAGNOSIS — M5430 Sciatica, unspecified side: Secondary | ICD-10-CM

## 2012-11-04 DIAGNOSIS — I10 Essential (primary) hypertension: Secondary | ICD-10-CM

## 2012-11-04 MED ORDER — HYDROCODONE-ACETAMINOPHEN 5-325 MG PO TABS: 1.0000 | ORAL_TABLET | ORAL | Status: DC | PRN

## 2012-11-04 MED ORDER — NEBIVOLOL HCL 20 MG PO TABS: 20.0000 mg | ORAL_TABLET | Freq: Every morning | ORAL | Status: DC

## 2012-11-04 MED ORDER — CYCLOBENZAPRINE HCL 10 MG PO TABS: 10.0000 mg | ORAL_TABLET | Freq: Three times a day (TID) | ORAL | Status: DC | PRN

## 2012-11-04 MED ORDER — NAPROXEN SODIUM 550 MG PO TABS: 550.0000 mg | ORAL_TABLET | Freq: Two times a day (BID) | ORAL | Status: AC

## 2012-11-04 NOTE — Progress Notes (Signed)
[facility] [address] [place] [address] [phone]  Date:  11/04/2012   Name:  Raymond Green   DOB:  1965/10/14   MRN:  [id number]  PCP:  Tally Due, MD    Chief Complaint: Hypertension and Back Pain   History of Present Illness:  Raymond Green is a 47 y.o. very pleasant male patient who presents with the following:  1 week duration of pain.  Started in his low back and has migrated to his right sciatic notch.  Radiates into the lateral right calf. Not associated with numbness, tingling or weakness.  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  No history of injury or overuse.  History of prior back injury.  No history of disc disease or prior MRI.  Has history of HBP and is not on medication currently.  Had angioedema to lisinopril.  Was last on bistolic.  No improvement with over the counter medications or other home remedies. Denies other complaint or health concern today.   Patient Active Problem List   Diagnosis Date Noted  . HYPERTENSION 03/02/2008  . ESOPHAGEAL STRICTURE 03/02/2008  . GERD 03/02/2008  . ANEMIA-IRON DEFICIENCY 12/13/2007  . HEMORRHOIDS-INTERNAL 12/13/2007  . HEMORRHOIDS-EXTERNAL 12/13/2007  . RECTAL BLEEDING 12/13/2007    Past Medical History  Diagnosis Date  . Hypertension   . OSA (obstructive sleep apnea)   . Anemia   . Positive PPD, treated 2000    INH  . Obesity   . Hemorrhoids   . LVH (left ventricular hypertrophy)     Dr. Donnie Aho ( cardiology)  . Kidney insufficiency   . Vitamin D deficiency   . Headache     Past Surgical History  Procedure Laterality Date  . Hemorrhoid surgery    . Colonoscopy  12/28/2011    Procedure: COLONOSCOPY;  Surgeon: [name], MD;  Location: [facility];  Service: Endoscopy;  Laterality: N/A;    History  Substance Use Topics  . Smoking status: Never Smoker   . Smokeless tobacco: Not on file  . Alcohol Use: Yes     Comment: Sometimes     Family History  Problem Relation Age of Onset  . Diabetes Mother   . Heart disease Mother   . Hypertension Mother   . Hypertension Father     Allergies  Allergen Reactions  . Lisinopril     Medication list has been reviewed and updated.  Current Outpatient Prescriptions on File Prior to Visit  Medication Sig Dispense Refill  . amLODipine (NORVASC) 10 MG tablet Take 1 tablet (10 mg total) by mouth daily.  90 tablet  3  . Calcium Carbonate-Vitamin D (CALCIUM 600 + D PO) Take 2 tablets by mouth daily.      . ferrous gluconate (FERGON) 325 MG tablet Take 325 mg by mouth daily with breakfast.      . Nebivolol HCl (BYSTOLIC) 20 MG TABS Take 1 tablet (20 mg total) by mouth every morning.  90 tablet  3   No current facility-administered medications on file prior to visit.    Review of Systems:  As per HPI, otherwise negative.    Physical Examination: Filed Vitals:   11/04/12 1136  BP: 190/115  Pulse: 77  Temp: 98.7 F (37.1 C)  Resp: 18   Filed Vitals:   11/04/12 1136  Weight: 264 lb (119.75 kg)   Body mass index is 36.31 kg/(m^2). Ideal Body Weight:    GEN: WDWN, NAD, Non-toxic,  A & O x 3 HEENT: Atraumatic, Normocephalic. Neck supple. No masses, No LAD. Ears and Nose: No external deformity. CV: RRR, No M/G/R. No JVD. No thrill. No extra heart sounds. PULM: CTA B, no wheezes, crackles, rhonchi. No retractions. No resp. distress. No accessory muscle use. ABD: S, NT, ND, +BS. No rebound. No HSM. EXTR: No c/c/e NEURO Normal gait.  PSYCH: Normally interactive. Conversant. Not depressed or anxious appearing.  Calm demeanor.    Assessment and Plan: Hypertension not compliant with treatment Overdue health maintenance Sciatic neuritis  Resume bistolic Follow up in one month  Anaprox Flexeril Follow up if not improved and plan MRI Signed,  Phillips Odor, MD

## 2012-11-04 NOTE — Patient Instructions (Addendum)
Hypertension As your heart beats, it forces blood through your arteries. This force is your blood pressure. If the pressure is too high, it is called hypertension (HTN) or high blood pressure. HTN is dangerous because you may have it and not know it. High blood pressure may mean that your heart has to work harder to pump blood. Your arteries may be narrow or stiff. The extra work puts you at risk for heart disease, stroke, and other problems.  Blood pressure consists of two numbers, a higher number over a lower, 110/72, for example. It is stated as "110 over 72." The ideal is below 120 for the top number (systolic) and under 80 for the bottom (diastolic). Write down your blood pressure today. You should pay close attention to your blood pressure if you have certain conditions such as:  Heart failure.  Prior heart attack.  Diabetes  Chronic kidney disease.  Prior stroke.  Multiple risk factors for heart disease. To see if you have HTN, your blood pressure should be measured while you are seated with your arm held at the level of the heart. It should be measured at least twice. A one-time elevated blood pressure reading (especially in the Emergency Department) does not mean that you need treatment. There may be conditions in which the blood pressure is different between your right and left arms. It is important to see your caregiver soon for a recheck. Most people have essential hypertension which means that there is not a specific cause. This type of high blood pressure may be lowered by changing lifestyle factors such as:  Stress.  Smoking.  Lack of exercise.  Excessive weight.  Drug/tobacco/alcohol use.  Eating less salt. Most people do not have symptoms from high blood pressure until it has caused damage to the body. Effective treatment can often prevent, delay or reduce that damage. TREATMENT  When a cause has been identified, treatment for high blood pressure is directed at the  cause. There are a large number of medications to treat HTN. These fall into several categories, and your caregiver will help you select the medicines that are best for you. Medications may have side effects. You should review side effects with your caregiver. If your blood pressure stays high after you have made lifestyle changes or started on medicines,   Your medication(s) may need to be changed.  Other problems may need to be addressed.  Be certain you understand your prescriptions, and know how and when to take your medicine.  Be sure to follow up with your caregiver within the time frame advised (usually within two weeks) to have your blood pressure rechecked and to review your medications.  If you are taking more than one medicine to lower your blood pressure, make sure you know how and at what times they should be taken. Taking two medicines at the same time can result in blood pressure that is too low. SEEK IMMEDIATE MEDICAL CARE IF:  You develop a severe headache, blurred or changing vision, or confusion.  You have unusual weakness or numbness, or a faint feeling.  You have severe chest or abdominal pain, vomiting, or breathing problems. MAKE SURE YOU:   Understand these instructions.  Will watch your condition.  Will get help right away if you are not doing well or get worse. Document Released: 06/05/2005 Document Revised: 08/28/2011 Document Reviewed: 01/24/2008 [facility] Patient Information 2013 [place], [place]. Sciatica Sciatica is pain, weakness, numbness, or tingling along the path of the sciatic nerve. The  nerve starts in the lower back and runs down the back of each leg. The nerve controls the muscles in the lower leg and in the back of the knee, while also providing sensation to the back of the thigh, lower leg, and the sole of your foot. Sciatica is a symptom of another medical condition. For instance, nerve damage or certain conditions, such as a herniated disk or  bone spur on the spine, pinch or put pressure on the sciatic nerve. This causes the pain, weakness, or other sensations normally associated with sciatica. Generally, sciatica only affects one side of the body. CAUSES   Herniated or slipped disc.  Degenerative disk disease.  A pain disorder involving the narrow muscle in the buttocks (piriformis syndrome).  Pelvic injury or fracture.  Pregnancy.  Tumor (rare). SYMPTOMS  Symptoms can vary from mild to very severe. The symptoms usually travel from the low back to the buttocks and down the back of the leg. Symptoms can include:  Mild tingling or dull aches in the lower back, leg, or hip.  Numbness in the back of the calf or sole of the foot.  Burning sensations in the lower back, leg, or hip.  Sharp pains in the lower back, leg, or hip.  Leg weakness.  Severe back pain inhibiting movement. These symptoms may get worse with coughing, sneezing, laughing, or prolonged sitting or standing. Also, being overweight may worsen symptoms. DIAGNOSIS  Your caregiver will perform a physical exam to look for common symptoms of sciatica. He or she may ask you to do certain movements or activities that would trigger sciatic nerve pain. Other tests may be performed to find the cause of the sciatica. These may include:  Blood tests.  X-rays.  Imaging tests, such as an MRI or CT scan. TREATMENT  Treatment is directed at the cause of the sciatic pain. Sometimes, treatment is not necessary and the pain and discomfort goes away on its own. If treatment is needed, your caregiver may suggest:  Over-the-counter medicines to relieve pain.  Prescription medicines, such as anti-inflammatory medicine, muscle relaxants, or narcotics.  Applying heat or ice to the painful area.  Steroid injections to lessen pain, irritation, and inflammation around the nerve.  Reducing activity during periods of pain.  Exercising and stretching to strengthen your  abdomen and improve flexibility of your spine. Your caregiver may suggest losing weight if the extra weight makes the back pain worse.  Physical therapy.  Surgery to eliminate what is pressing or pinching the nerve, such as a bone spur or part of a herniated disk. HOME CARE INSTRUCTIONS   Only take over-the-counter or prescription medicines for pain or discomfort as directed by your caregiver.  Apply ice to the affected area for 20 minutes, 3 4 times a day for the first 48 72 hours. Then try heat in the same way.  Exercise, stretch, or perform your usual activities if these do not aggravate your pain.  Attend physical therapy sessions as directed by your caregiver.  Keep all follow-up appointments as directed by your caregiver.  Do not wear high heels or shoes that do not provide proper support.  Check your mattress to see if it is too soft. A firm mattress may lessen your pain and discomfort. SEEK IMMEDIATE MEDICAL CARE IF:   You lose control of your bowel or bladder (incontinence).  You have increasing weakness in the lower back, pelvis, buttocks, or legs.  You have redness or swelling of your back.  You  have a burning sensation when you urinate.  You have pain that gets worse when you lie down or awakens you at night.  Your pain is worse than you have experienced in the past.  Your pain is lasting longer than 4 weeks.  You are suddenly losing weight without reason. MAKE SURE YOU:  Understand these instructions.  Will watch your condition.  Will get help right away if you are not doing well or get worse. Document Released: 05/30/2001 Document Revised: 12/05/2011 Document Reviewed: 10/15/2011 [facility] Patient Information 2013 [place], [place].

## 2012-11-06 NOTE — Progress Notes (Signed)
Reviewed and agree.

## 2013-02-20 ENCOUNTER — Other Ambulatory Visit: Payer: Self-pay | Admitting: Emergency Medicine

## 2013-04-26 IMAGING — CR DG CHEST 2V
2 series · 2 of 2 positions shown · non-contrast
Comparison: None.

CLINICAL DATA: Preop for tonsillectomy.  Turbinate resection.
Sleep apnea.  Nonsmoker.

CHEST - 2 VIEW

[view not recorded (1 of 2)]
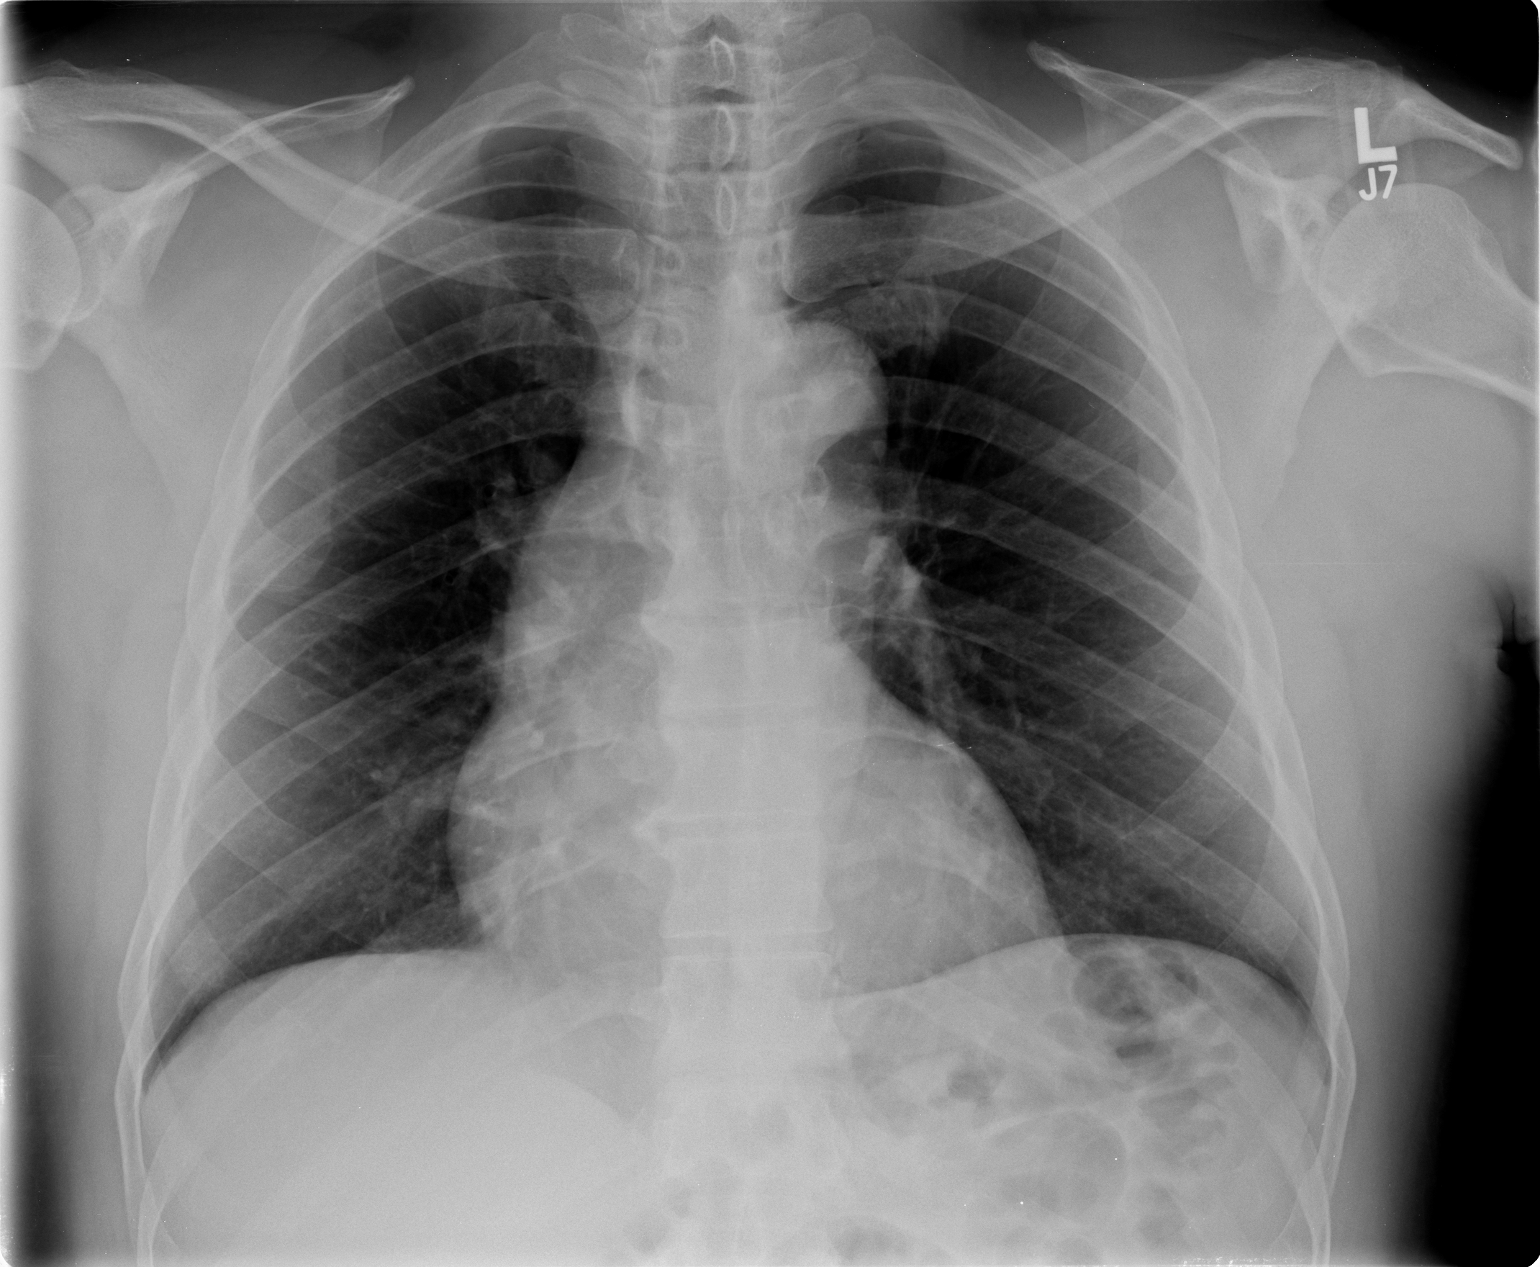

[view not recorded (2 of 2)]
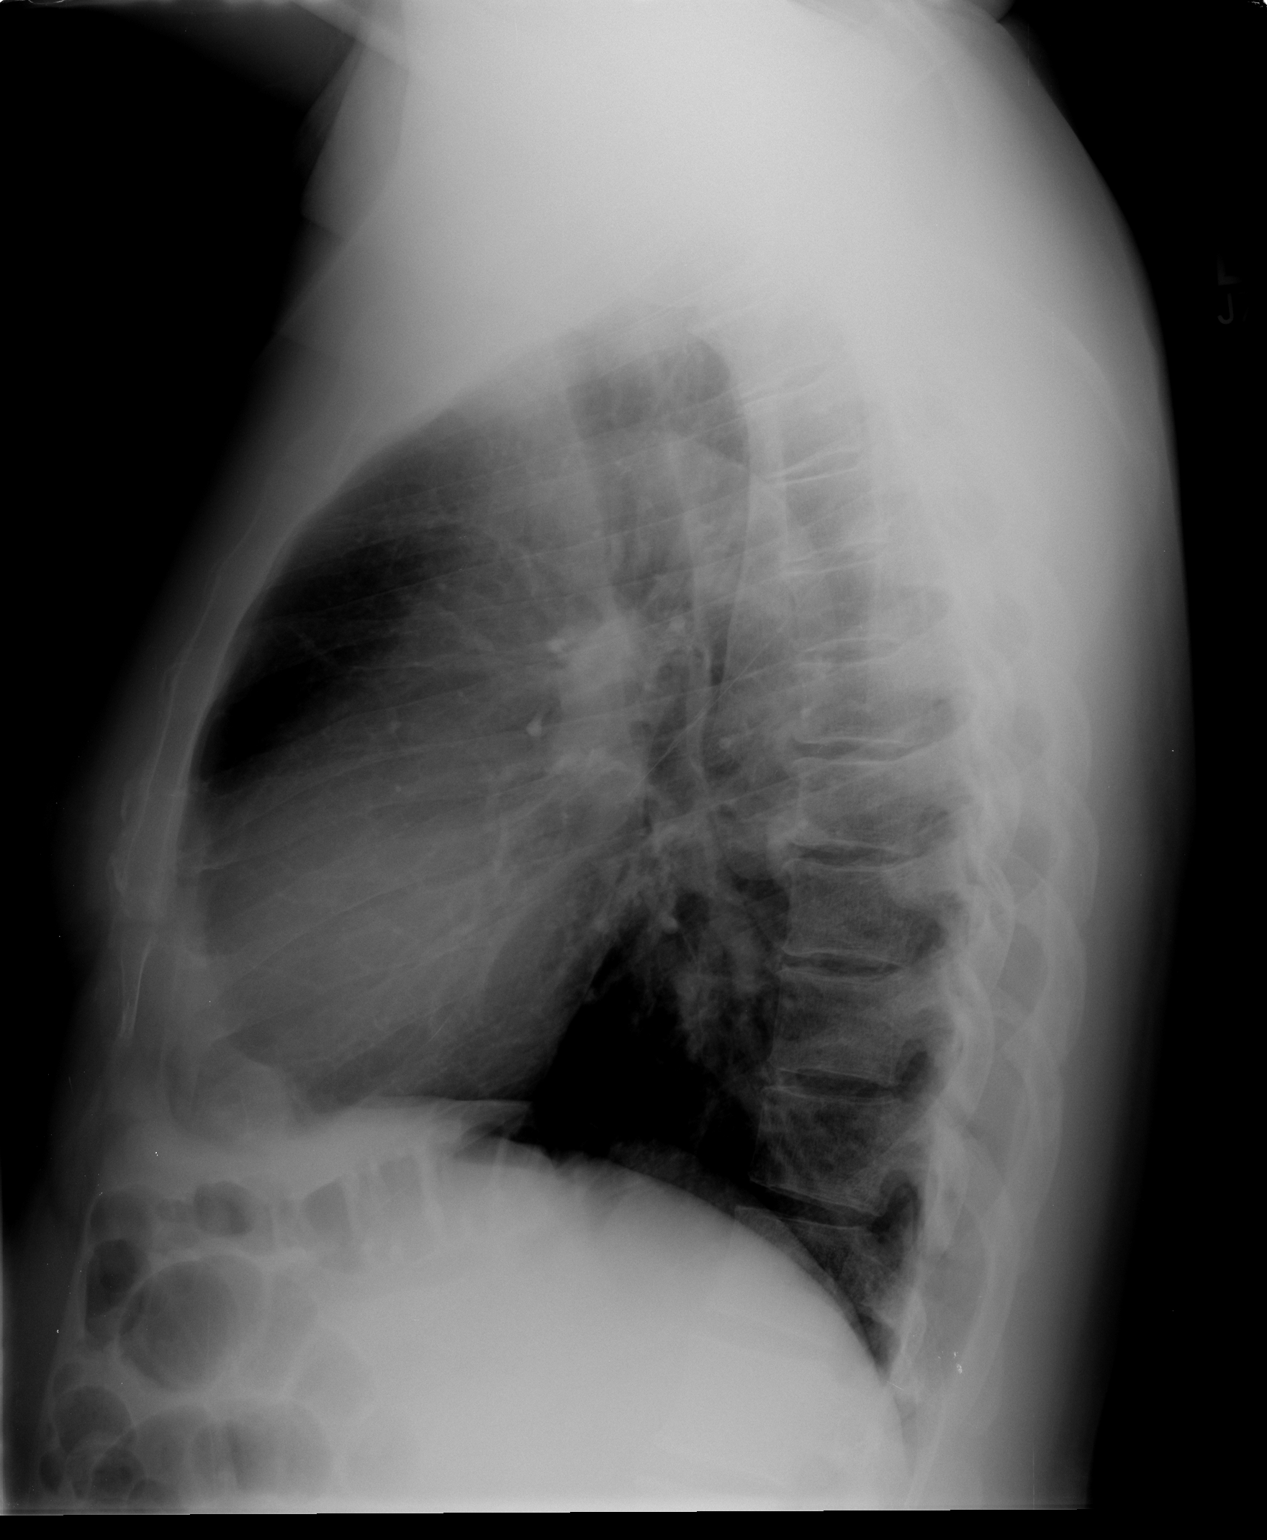

[2 of 2 positions shown; findings below may reference images not displayed]

FINDINGS: Midline trachea.  Normal heart size.  Mildly tortuous
thoracic aorta. No pleural effusion or pneumothorax.  Clear lungs.
IMPRESSION: No acute cardiopulmonary disease.

## 2013-05-12 ENCOUNTER — Ambulatory Visit (INDEPENDENT_AMBULATORY_CARE_PROVIDER_SITE_OTHER): Payer: 59 | Admitting: Emergency Medicine

## 2013-05-12 ENCOUNTER — Ambulatory Visit: Payer: 59

## 2013-05-12 VITALS — BP 146/70 | HR 66 | Temp 98.9°F | Resp 18 | Ht 72.0 in | Wt 272.6 lb

## 2013-05-12 DIAGNOSIS — S139XXA Sprain of joints and ligaments of unspecified parts of neck, initial encounter: Secondary | ICD-10-CM

## 2013-05-12 DIAGNOSIS — M25539 Pain in unspecified wrist: Secondary | ICD-10-CM

## 2013-05-12 DIAGNOSIS — M25532 Pain in left wrist: Secondary | ICD-10-CM

## 2013-05-12 DIAGNOSIS — S63509A Unspecified sprain of unspecified wrist, initial encounter: Secondary | ICD-10-CM

## 2013-05-12 MED ORDER — NAPROXEN SODIUM 550 MG PO TABS: 550.0000 mg | ORAL_TABLET | Freq: Two times a day (BID) | ORAL | Status: DC
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
## 2013-05-12 NOTE — Patient Instructions (Signed)
Wrist Sprain °with Rehab °A sprain is an injury in which a ligament that maintains the proper alignment of a joint is partially or completely torn. The ligaments of the wrist are susceptible to sprains. Sprains are classified into three categories. Grade 1 sprains cause pain, but the tendon is not lengthened. Grade 2 sprains include a lengthened ligament because the ligament is stretched or partially ruptured. With grade 2 sprains there is still function, although the function may be diminished. Grade 3 sprains are characterized by a complete tear of the tendon or muscle, and function is usually impaired. °SYMPTOMS  °· Pain tenderness, inflammation, and/or bruising (contusion) of the injury. °· A "pop" or tear felt and/or heard at the time of injury. °· Decreased wrist function. °CAUSES  °A wrist sprain occurs when a force is placed on one or more ligaments that is greater than it/they can withstand. Common mechanisms of injury include: °· Catching a ball with you hands. °· Repetitive and/ or strenuous extension or flexion of the wrist. °RISK INCREASES WITH: °· Previous wrist injury. °· Contact sports (boxing or wrestling). °· Activities in which falling is common. °· Poor strength and flexibility. °· Improperly fitted or padded protective equipment. °PREVENTION °· Warm up and stretch properly before activity. °· Allow for adequate recovery between workouts. °· Maintain physical fitness: °· Strength, flexibility, and endurance. °· Cardiovascular fitness. °· Protect the wrist joint by limiting its motion with the use of taping, braces, or splints. °· Protect the wrist after injury for 6 to 12 months. °PROGNOSIS  °The prognosis for wrist sprains depends on the degree of injury. Grade 1 sprains require 2 to 6 weeks of treatment. Grade 2 sprains require 6 to 8 weeks of treatment, and grade 3 sprains require up to 12 weeks.  °RELATED COMPLICATIONS  °· Prolonged healing time, if improperly treated or  re-injured. °· Recurrent symptoms that result in a chronic problem. °· Injury to nearby structures (bone, cartilage, nerves, or tendons). °· Arthritis of the wrist. °· Inability to compete in athletics at a high level. °· Wrist stiffness or weakness. °· Progression to a complete rupture of the ligament. °TREATMENT  °Treatment initially involves resting from any activities that aggravate the symptoms, and the use of ice and medications to help reduce pain and inflammation. Your caregiver may recommend immobilizing the wrist for a period of time in order to reduce stress on the ligament and allow for healing. After immobilization it is important to perform strengthening and stretching exercises to help regain strength and a full range of motion. These exercises may be completed at home or with a therapist. Surgery is not usually required for wrist sprains, unless the ligament has been ruptured (grade 3 sprain). °MEDICATION  °· If pain medication is necessary, then nonsteroidal anti-inflammatory medications, such as aspirin and ibuprofen, or other minor pain relievers, such as acetaminophen, are often recommended. °· Do not take pain medication for 7 days before surgery. °· Prescription pain relievers may be given if deemed necessary by your caregiver. Use only as directed and only as much as you need. °HEAT AND COLD °· Cold treatment (icing) relieves pain and reduces inflammation. Cold treatment should be applied for 10 to 15 minutes every 2 to 3 hours for inflammation and pain and immediately after any activity that aggravates your symptoms. Use ice packs or massage the area with a piece of ice (ice massage). °· Heat treatment may be used prior to performing the stretching and strengthening activities prescribed by your   caregiver, physical therapist, or athletic trainer. Use a heat pack or soak your injury in warm water. °SEEK MEDICAL CARE IF: °· Treatment seems to offer no benefit, or the condition worsens. °· Any  medications produce adverse side effects. °EXERCISES °RANGE OF MOTION (ROM) AND STRETCHING EXERCISES - Wrist Sprain  °These exercises may help you when beginning to rehabilitate your injury. Your symptoms may resolve with or without further involvement from your physician, physical therapist or athletic trainer. While completing these exercises, remember:  °· Restoring tissue flexibility helps normal motion to return to the joints. This allows healthier, less painful movement and activity. °· An effective stretch should be held for at least 30 seconds. °· A stretch should never be painful. You should only feel a gentle lengthening or release in the stretched tissue. °RANGE OF MOTION  Wrist Flexion, Active-Assisted °· Extend your right / left elbow with your fingers pointing down.* °· Gently pull the back of your hand towards you until you feel a gentle stretch on the top of your forearm. °· Hold this position for __________ seconds. °Repeat __________ times. Complete this exercise __________ times per day.  °*If directed by your physician, physical therapist or athletic trainer, complete this stretch with your elbow bent rather than extended. °RANGE OF MOTION  Wrist Extension, Active-Assisted °· Extend your right / left elbow and turn your palm upwards.* °· Gently pull your palm/fingertips back so your wrist extends and your fingers point more toward the ground. °· You should feel a gentle stretch on the inside of your forearm. °· Hold this position for __________ seconds. °Repeat __________ times. Complete this exercise __________ times per day. °*If directed by your physician, physical therapist or athletic trainer, complete this stretch with your elbow bent, rather than extended. °RANGE OF MOTION  Supination, Active °· Stand or sit with your elbows at your side. Bend your right / left elbow to 90 degrees. °· Turn your palm upward until you feel a gentle stretch on the inside of your forearm. °· Hold this position  for __________ seconds. Slowly release and return to the starting position. °Repeat __________ times. Complete this stretch __________ times per day.  °RANGE OF MOTION  Pronation, Active °· Stand or sit with your elbows at your side. Bend your right / left elbow to 90 degrees. °· Turn your palm downward until you feel a gentle stretch on the top of your forearm. °· Hold this position for __________ seconds. Slowly release and return to the starting position. °Repeat __________ times. Complete this stretch __________ times per day.  °STRETCH - Wrist Flexion °· Place the back of your right / left hand on a tabletop leaving your elbow slightly bent. Your fingers should point away from your body. °· Gently press the back of your hand down onto the table by straightening your elbow. You should feel a stretch on the top of your forearm. °· Hold this position for __________ seconds. °Repeat __________ times. Complete this stretch __________ times per day.  °STRETCH  Wrist Extension °· Place your right / left fingertips on a tabletop leaving your elbow slightly bent. Your fingers should point backwards. °· Gently press your fingers and palm down onto the table by straightening your elbow. You should feel a stretch on the inside of your forearm. °· Hold this position for __________ seconds. °Repeat __________ times. Complete this stretch __________ times per day.  °STRENGTHENING EXERCISES - Wrist Sprain °These exercises may help you when beginning to rehabilitate your injury.   They may resolve your symptoms with or without further involvement from your physician, physical therapist or athletic trainer. While completing these exercises, remember:  °· Muscles can gain both the endurance and the strength needed for everyday activities through controlled exercises. °· Complete these exercises as instructed by your physician, physical therapist or athletic trainer. Progress with the resistance and repetition exercises only as your  caregiver advises. °STRENGTH Wrist Flexors °· Sit with your right / left forearm palm-up and fully supported. Your elbow should be resting below the height of your shoulder. Allow your wrist to extend over the edge of the surface. °· Loosely holding a __________ weight or a piece of rubber exercise band/tubing, slowly curl your hand up toward your forearm. °· Hold this position for __________ seconds. Slowly lower the wrist back to the starting position in a controlled manner. °Repeat __________ times. Complete this exercise __________ times per day.  °STRENGTH  Wrist Extensors °· Sit with your right / left forearm palm-down and fully supported. Your elbow should be resting below the height of your shoulder. Allow your wrist to extend over the edge of the surface. °· Loosely holding a __________ weight or a piece of rubber exercise band/tubing, slowly curl your hand up toward your forearm. °· Hold this position for __________ seconds. Slowly lower the wrist back to the starting position in a controlled manner. °Repeat __________ times. Complete this exercise __________ times per day.  °STRENGTH - Ulnar Deviators °· Stand with a ____________________ weight in your right / left hand, or sit holding on to the rubber exercise band/tubing with your opposite arm supported. °· Move your wrist so that your pinkie travels toward your forearm and your thumb moves away from your forearm. °· Hold this position for __________ seconds and then slowly lower the wrist back to the starting position. °Repeat __________ times. Complete this exercise __________ times per day °STRENGTH - Radial Deviators °· Stand with a ____________________ weight in your °· right / left hand, or sit holding on to the rubber exercise band/tubing with your arm supported. °· Raise your hand upward in front of you or pull up on the rubber tubing. °· Hold this position for __________ seconds and then slowly lower the wrist back to the starting  position. °Repeat __________ times. Complete this exercise __________ times per day. °STRENGTH  Forearm Supinators °· Sit with your right / left forearm supported on a table, keeping your elbow below shoulder height. Rest your hand over the edge, palm down. °· Gently grip a hammer or a soup ladle. °· Without moving your elbow, slowly turn your palm and hand upward to a "thumbs-up" position. °· Hold this position for __________ seconds. Slowly return to the starting position. °Repeat __________ times. Complete this exercise __________ times per day.  °STRENGTH  Forearm Pronators °· Sit with your right / left forearm supported on a table, keeping your elbow below shoulder height. Rest your hand over the edge, palm up. °· Gently grip a hammer or a soup ladle. °· Without moving your elbow, slowly turn your palm and hand upward to a "thumbs-up" position. °· Hold this position for __________ seconds. Slowly return to the starting position. °Repeat __________ times. Complete this exercise __________ times per day.  °STRENGTH - Grip °· Grasp a tennis ball, a dense sponge, or a large, rolled sock in your hand. °· Squeeze as hard as you can without increasing any pain. °· Hold this position for __________ seconds. Release your grip slowly. °Repeat   __________ times. Complete this exercise __________ times per day.  Document Released: 06/05/2005 Document Revised: 08/28/2011 Document Reviewed: 09/17/2008 [facility] Patient Information 2014 [place], [place]. Cervical Sprain A cervical sprain is an injury in the neck in which the ligaments are stretched or torn. The ligaments are the tissues that hold the bones of the neck (vertebrae) in place.Cervical sprains can range from very mild to very severe. Most cervical sprains get better in 1 to 3 weeks, but it depends on the cause and extent of the injury. Severe cervical sprains can cause the neck vertebrae to be unstable. This can lead to damage of the spinal cord and can result  in serious nervous system problems. Your caregiver will determine whether your cervical sprain is mild or severe. CAUSES  Severe cervical sprains may be caused by:  Contact sport injuries (football, rugby, wrestling, hockey, auto racing, gymnastics, diving, martial arts, boxing).  Motor vehicle collisions.  Whiplash injuries. This means the neck is forcefully whipped backward and forward.  Falls. Mild cervical sprains may be caused by:   Awkward positions, such as cradling a telephone between your ear and shoulder.  Sitting in a chair that does not offer proper support.  Working at a poorly Marketing executive station.  Activities that require looking up or down for long periods of time. SYMPTOMS   Pain, soreness, stiffness, or a burning sensation in the front, back, or sides of the neck. This discomfort may develop immediately after injury or it may develop slowly and not begin for 24 hours or more after an injury.  Pain or tenderness directly in the middle of the back of the neck.  Shoulder or upper back pain.  Limited ability to move the neck.  Headache.  Dizziness.  Weakness, numbness, or tingling in the hands or arms.  Muscle spasms.  Difficulty swallowing or chewing.  Tenderness and swelling of the neck. DIAGNOSIS  Most of the time, your caregiver can diagnose this problem by taking your history and doing a physical exam. Your caregiver will ask about any known problems, such as arthritis in the neck or a previous neck injury. X-rays may be taken to find out if there are any other problems, such as problems with the bones of the neck. However, an X-ray often does not reveal the full extent of a cervical sprain. Other tests such as a computed tomography (CT) scan or magnetic resonance imaging (MRI) may be needed. TREATMENT  Treatment depends on the severity of the cervical sprain. Mild sprains can be treated with rest, keeping the neck in place (immobilization), and  pain medicines. Severe cervical sprains need immediate immobilization and an appointment with an orthopedist or neurosurgeon. Several treatment options are available to help with pain, muscle spasms, and other symptoms. Your caregiver may prescribe:  Medicines, such as pain relievers, numbing medicines, or muscle relaxants.  Physical therapy. This can include stretching exercises, strengthening exercises, and posture training. Exercises and improved posture can help stabilize the neck, strengthen muscles, and help stop symptoms from returning.  A neck collar to be worn for short periods of time. Often, these collars are worn for comfort. However, certain collars may be worn to protect the neck and prevent further worsening of a serious cervical sprain. HOME CARE INSTRUCTIONS   Put ice on the injured area.  Put ice in a plastic bag.  Place a towel between your skin and the bag.  Leave the ice on for 15-20 minutes, 03-04 times a day.  Only take over-the-counter  or prescription medicines for pain, discomfort, or fever as directed by your caregiver.  Keep all follow-up appointments as directed by your caregiver.  Keep all physical therapy appointments as directed by your caregiver.  If a neck collar is prescribed, wear it as directed by your caregiver.  Do not drive while wearing a neck collar.  Make any needed adjustments to your work station to promote good posture.  Avoid positions and activities that make your symptoms worse.  Warm up and stretch before being active to help prevent problems. SEEK MEDICAL CARE IF:   Your pain is not controlled with medicine.  You are unable to decrease your pain medicine over time as planned.  Your activity level is not improving as expected. SEEK IMMEDIATE MEDICAL CARE IF:   You develop any bleeding, stomach upset, or signs of an allergic reaction to your medicine.  Your symptoms get worse.  You develop new, unexplained symptoms.  You  have numbness, tingling, weakness, or paralysis in any part of your body. MAKE SURE YOU:   Understand these instructions.  Will watch your condition.  Will get help right away if you are not doing well or get worse. Document Released: 04/02/2007 Document Revised: 08/28/2011 Document Reviewed: 12/11/2012 [facility] Patient Information 2014 [place], [place].

## 2013-06-23 ENCOUNTER — Ambulatory Visit (INDEPENDENT_AMBULATORY_CARE_PROVIDER_SITE_OTHER): Payer: 59 | Admitting: Family Medicine

## 2013-06-23 VITALS — BP 168/100 | HR 92 | Temp 98.8°F | Resp 18 | Ht 71.0 in | Wt 268.0 lb

## 2013-06-23 DIAGNOSIS — H109 Unspecified conjunctivitis: Secondary | ICD-10-CM

## 2013-06-23 DIAGNOSIS — R05 Cough: Secondary | ICD-10-CM

## 2013-06-23 DIAGNOSIS — R059 Cough, unspecified: Secondary | ICD-10-CM

## 2013-06-23 LAB — POCT INFLUENZA A/B
Influenza A, POC: NEGATIVE
Influenza B, POC: NEGATIVE

## 2013-06-23 MED ORDER — POLYMYXIN B-TRIMETHOPRIM 10000-0.1 UNIT/ML-% OP SOLN: 1.0000 [drp] | OPHTHALMIC | Status: DC

## 2013-06-23 MED ORDER — DOXYCYCLINE HYCLATE 100 MG PO TABS: 100.0000 mg | ORAL_TABLET | Freq: Two times a day (BID) | ORAL | Status: DC

## 2013-06-23 NOTE — Patient Instructions (Signed)
Your flu test is negative, so we are going to treat you with doxycycline for bronchitis.   Your BP is high today- maybe due to your illness.  Take an extra 1/2 bystolic pill for the next week or so.  If your BP remains high we may need to increase your dose for good.    You can use your left- over pain pills as needed for cough Use the eye drops as directed.

## 2013-06-23 NOTE — Progress Notes (Signed)
[facility] [address] [place] [address] [phone]  Date:  06/23/2013   Name:  Raymond Green   DOB:  1966/01/31   MRN:  [id number]  PCP:  Tally Due, MD    Chief Complaint: Cough   History of Present Illness:  Raymond Green is a 48 y.o. very pleasant male patient who presents with the following:  Here with a concern about flu.  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  Generally healthy except for HTN He has had a cough for about one week.   He is coughing up some mucus.   He has not noted a fever.   He feels achy and tired He has pain when he coughs. He does have a ST and runny nose.    No GI symptoms.    He has tried some robitussin He is taking bystolic now for his BP.    He also notes that his eyes are crusty in the morning.  No pain, vision is ok.  He wears glasses.  Would like to have some drops for this.     Patient Active Problem List   Diagnosis Date Noted  . HYPERTENSION 03/02/2008  . ESOPHAGEAL STRICTURE 03/02/2008  . GERD 03/02/2008  . ANEMIA-IRON DEFICIENCY 12/13/2007  . HEMORRHOIDS-INTERNAL 12/13/2007  . HEMORRHOIDS-EXTERNAL 12/13/2007  . RECTAL BLEEDING 12/13/2007    Past Medical History  Diagnosis Date  . Hypertension   . OSA (obstructive sleep apnea)   . Anemia   . Positive PPD, treated 2000    INH  . Obesity   . Hemorrhoids   . LVH (left ventricular hypertrophy)     Dr. Donnie Aho ( cardiology)  . Kidney insufficiency   . Vitamin D deficiency   . GNFAOZHY(865.7)     Past Surgical History  Procedure Laterality Date  . Hemorrhoid surgery    . Colonoscopy  12/28/2011    Procedure: COLONOSCOPY;  Surgeon: [name], MD;  Location: [facility];  Service: Endoscopy;  Laterality: N/A;    History  Substance Use Topics  . Smoking status: Never Smoker   . Smokeless tobacco: Not on file  . Alcohol Use: Yes     Comment: Sometimes    Family History  Problem Relation Age of Onset  . Diabetes Mother    . Heart disease Mother   . Hypertension Mother   . Hypertension Father     Allergies  Allergen Reactions  . Lisinopril     Medication list has been reviewed and updated.  Current Outpatient Prescriptions on File Prior to Visit  Medication Sig Dispense Refill  . BYSTOLIC 20 MG TABS Take 1 tablet (20 mg total) by mouth daily. PATIENT NEEDS OFFICE VISIT FOR ADDITIONAL REFILLS  90 tablet  0  . Calcium Carbonate-Vitamin D (CALCIUM 600 + D PO) Take 2 tablets by mouth daily.      . cyclobenzaprine (FLEXERIL) 10 MG tablet Take 1 tablet (10 mg total) by mouth 3 (three) times daily as needed for muscle spasms.  30 tablet  0  . ferrous gluconate (FERGON) 325 MG tablet Take 325 mg by mouth daily with breakfast.      . naproxen sodium (ANAPROX DS) 550 MG tablet Take 1 tablet (550 mg total) by mouth 2 (two) times daily with a meal.  40 tablet  0  . amLODipine (NORVASC) 10 MG tablet Take 1 tablet (10 mg total) by mouth daily.  90 tablet  3  . HYDROcodone-acetaminophen (NORCO) 5-325  MG per tablet Take 1-2 tablets by mouth every 4 (four) hours as needed for pain.  30 tablet  0   No current facility-administered medications on file prior to visit.    Review of Systems:  As per HPI- otherwise negative.   Physical Examination: Filed Vitals:   06/23/13 1456  BP: 168/100  Pulse: 92  Temp: 98.8 F (37.1 C)  Resp: 18   Filed Vitals:   06/23/13 1456  Height: 5\' 11"  (1.803 m)  Weight: 268 lb (121.564 kg)   Body mass index is 37.39 kg/(m^2). Ideal Body Weight: Weight in (lb) to have BMI = 25: 178.9  GEN: WDWN, NAD, Non-toxic, A & O x 3, obese, looks well HEENT: Atraumatic, Normocephalic. Neck supple. No masses, No LAD.  Bilateral TM wnl, oropharynx normal.  PEERL,EOMI.   Ears and Nose: No external deformity. CV: RRR, No M/G/R. No JVD. No thrill. No extra heart sounds. PULM: CTA B, no wheezes, crackles, rhonchi. No retractions. No resp. distress. No accessory muscle use. ABD: S, NT,  ND EXTR: No c/c/e NEURO Normal gait.  PSYCH: Normally interactive. Conversant. Not depressed or anxious appearing.  Calm demeanor.   Results for orders placed in visit on 06/23/13  POCT INFLUENZA A/B      Result Value Range   Influenza A, POC Negative     Influenza B, POC Negative      Assessment and Plan: Cough - Plan: POCT Influenza A/B, doxycycline (VIBRA-TABS) 100 MG tablet  Conjunctivitis - Plan: trimethoprim-polymyxin b (POLYTRIM) ophthalmic solution  Negative flu test.  As his cough is now a week old will begin tx for bronchitis with doxycycine.   polytrim for possible pinkeye as well BP is elevated.  He will take an extra 1/2 bysolic pill and watch his BP for the next week.    Signed Abbe AmsterdamJessica Copland, MD

## 2013-07-16 DIAGNOSIS — Z0271 Encounter for disability determination: Secondary | ICD-10-CM

## 2013-10-29 ENCOUNTER — Ambulatory Visit (INDEPENDENT_AMBULATORY_CARE_PROVIDER_SITE_OTHER): Payer: 59 | Admitting: Family Medicine

## 2013-10-29 VITALS — BP 180/100 | HR 88 | Temp 98.7°F | Resp 18 | Ht 70.5 in | Wt 262.0 lb

## 2013-10-29 DIAGNOSIS — M109 Gout, unspecified: Secondary | ICD-10-CM

## 2013-10-29 MED ORDER — INDOMETHACIN 50 MG PO CAPS: 50.0000 mg | ORAL_CAPSULE | Freq: Three times a day (TID) | ORAL | Status: DC

## 2013-10-29 NOTE — Progress Notes (Signed)
This chart was scribed for Raymond SidleKurt Lauenstein, MD by Nicholos Johnsenise Iheanachor, Medical Scribe. This patient's care was started at 8:53 PM   Patient ID: Raymond Green MRN: [id number], DOB: 1966/04/16, 48 y.o. Date of Encounter: 10/29/2013, 8:52 PM  Primary Physician: Tally DueGUEST, CHRIS WARREN, MD  Chief Complaint: swollen left big toe  HPI: 48 y.o. year old male with history of gout presents with swollen, sore left big toe. Pain presented this morning. Pt is able to ambulate but pain is present and pace is decreased. States he had gout approximately 6 months ago. 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   Past Medical History  Diagnosis Date   Hypertension    OSA (obstructive sleep apnea)    Anemia    Positive PPD, treated 2000    INH   Obesity    Hemorrhoids    LVH (left ventricular hypertrophy)     Dr. Donnie Ahoilley ( cardiology)   Kidney insufficiency    Vitamin D deficiency    Headache(784.0)      Home Meds: Prior to Admission medications   Medication Sig Start Date End Date Taking? Authorizing Provider  Calcium Carbonate-Vitamin D (CALCIUM 600 + D PO) Take 2 tablets by mouth daily.   Yes Historical Provider, MD  ferrous gluconate (FERGON) 325 MG tablet Take 325 mg by mouth daily with breakfast.   Yes Historical Provider, MD  amLODipine (NORVASC) 10 MG tablet Take 1 tablet (10 mg total) by mouth daily. 11/20/11 11/19/12  Raymond SidleKurt Lauenstein, MD  BYSTOLIC 20 MG TABS Take 1 tablet (20 mg total) by mouth daily. PATIENT NEEDS OFFICE VISIT FOR ADDITIONAL REFILLS 02/20/13   Godfrey PickEleanore E Egan, PA-C  cyclobenzaprine (FLEXERIL) 10 MG tablet Take 1 tablet (10 mg total) by mouth 3 (three) times daily as needed for muscle spasms. 11/04/12   Phillips OdorJeffery Anderson, MD  doxycycline (VIBRA-TABS) 100 MG tablet Take 1 tablet (100 mg total) by mouth 2 (two) times daily. 06/23/13   Pearline CablesJessica C Copland, MD  HYDROcodone-acetaminophen (NORCO) 5-325 MG per tablet Take 1-2 tablets by mouth every 4 (four) hours as needed for pain. 11/04/12    Phillips OdorJeffery Anderson, MD  naproxen sodium (ANAPROX DS) 550 MG tablet Take 1 tablet (550 mg total) by mouth 2 (two) times daily with a meal. 11/04/12 11/04/13  Phillips OdorJeffery Anderson, MD  trimethoprim-polymyxin b (POLYTRIM) ophthalmic solution Place 1 drop into both eyes every 4 (four) hours. 06/23/13   Pearline CablesJessica C Copland, MD    Allergies:  Allergies  Allergen Reactions   Lisinopril     History   Social History   Marital Status: Legally Separated    Spouse Name: N/A    Number of Children: N/A   Years of Education: N/A   Occupational History   Not on file.   Social History Main Topics   Smoking status: Never Smoker    Smokeless tobacco: Not on file   Alcohol Use: Yes     Comment: Sometimes   Drug Use: No   Sexual Activity: Yes   Other Topics Concern   Not on file   Social History Narrative   No narrative on file     Review of Systems: Constitutional: negative for chills, fever, night sweats, weight changes, or fatigue  HEENT: negative for vision changes, hearing loss, congestion, rhinorrhea, ST, epistaxis, or sinus pressure Cardiovascular: negative for chest pain or palpitations Respiratory: negative for hemoptysis, wheezing, shortness of breath, or cough Abdominal: negative for abdominal pain, nausea, vomiting, diarrhea, or constipation Dermatological: negative for rash Neurologic: negative for  headache, dizziness, or syncope All other systems reviewed and are otherwise negative with the exception to those above and in the HPI.   Physical Exam: Blood pressure 180/100, pulse 88, temperature 98.7 F (37.1 C), temperature source Oral, resp. rate 18, height 5' 10.5" (1.791 m), weight 262 lb (118.842 kg), SpO2 97.00%., Body mass index is 37.05 kg/(m^2). General: Well developed, well nourished, in no acute distress. Head: Normocephalic, atraumatic, eyes without discharge, sclera non-icteric, nares are without discharge. Bilateral auditory canals clear, TM's are without  perforation, pearly grey and translucent with reflective cone of light bilaterally. Oral cavity moist, posterior pharynx without exudate, erythema, peritonsillar abscess, or post nasal drip.  Neck: Supple. No thyromegaly. Full ROM. No lymphadenopathy. Lungs: Clear bilaterally to auscultation without wheezes, rales, or rhonchi. Breathing is unlabored. Heart: RRR with S1 S2. No murmurs, rubs, or gallops appreciated. Abdomen: Soft, non-tender, non-distended with normoactive bowel sounds. No hepatomegaly. No rebound/guarding. No obvious abdominal masses. Msk:  Strength and tone normal for age. Extremities/Skin: Warm and dry. No clubbing or cyanosis. No edema. No rashes or suspicious lesions. Tenderness and swelling at the head of the left 1st metatarsal Neuro: Alert and oriented X 3. Moves all extremities spontaneously. Gait is normal. CNII-XII grossly in tact. Psych:  Responds to questions appropriately with a normal affect.     ASSESSMENT AND PLAN:  48 y.o. year old male with Gout - Plan: indomethacin (INDOCIN) 50 MG capsule     Signed, Raymond SidleKurt Lauenstein, MD 10/29/2013 8:52 PM

## 2013-10-29 NOTE — Patient Instructions (Addendum)

## 2013-12-03 ENCOUNTER — Encounter: Payer: Self-pay | Admitting: Family Medicine

## 2013-12-26 ENCOUNTER — Ambulatory Visit (INDEPENDENT_AMBULATORY_CARE_PROVIDER_SITE_OTHER): Payer: 59 | Admitting: Emergency Medicine

## 2013-12-26 VITALS — BP 168/100 | HR 71 | Temp 98.6°F | Resp 16 | Ht 72.0 in | Wt 257.0 lb

## 2013-12-26 DIAGNOSIS — N3000 Acute cystitis without hematuria: Secondary | ICD-10-CM

## 2013-12-26 DIAGNOSIS — I10 Essential (primary) hypertension: Secondary | ICD-10-CM

## 2013-12-26 LAB — COMPREHENSIVE METABOLIC PANEL
ALK PHOS: 50 U/L (ref 39–117)
ALT: 12 U/L (ref 0–53)
AST: 16 U/L (ref 0–37)
Albumin: 4.1 g/dL (ref 3.5–5.2)
BILIRUBIN TOTAL: 0.5 mg/dL (ref 0.2–1.2)
BUN: 14 mg/dL (ref 6–23)
CO2: 28 mEq/L (ref 19–32)
Calcium: 9.7 mg/dL (ref 8.4–10.5)
Chloride: 104 mEq/L (ref 96–112)
Creat: 1.38 mg/dL — ABNORMAL HIGH (ref 0.50–1.35)
GLUCOSE: 80 mg/dL (ref 70–99)
Potassium: 4.8 mEq/L (ref 3.5–5.3)
SODIUM: 141 meq/L (ref 135–145)
TOTAL PROTEIN: 7.4 g/dL (ref 6.0–8.3)

## 2013-12-26 LAB — CBC WITH DIFFERENTIAL/PLATELET
BASOS ABS: 0 10*3/uL (ref 0.0–0.1)
BASOS PCT: 1 % (ref 0–1)
EOS ABS: 0.1 10*3/uL (ref 0.0–0.7)
Eosinophils Relative: 3 % (ref 0–5)
HCT: 39.9 % (ref 39.0–52.0)
Hemoglobin: 13.6 g/dL (ref 13.0–17.0)
Lymphocytes Relative: 49 % — ABNORMAL HIGH (ref 12–46)
Lymphs Abs: 2.2 10*3/uL (ref 0.7–4.0)
MCH: 29.6 pg (ref 26.0–34.0)
MCHC: 34.1 g/dL (ref 30.0–36.0)
MCV: 86.9 fL (ref 78.0–100.0)
MONOS PCT: 5 % (ref 3–12)
Monocytes Absolute: 0.2 10*3/uL (ref 0.1–1.0)
NEUTROS ABS: 1.8 10*3/uL (ref 1.7–7.7)
NEUTROS PCT: 42 % — AB (ref 43–77)
PLATELETS: 159 10*3/uL (ref 150–400)
RBC: 4.59 MIL/uL (ref 4.22–5.81)
RDW: 14.1 % (ref 11.5–15.5)
WBC: 4.4 10*3/uL (ref 4.0–10.5)

## 2013-12-26 LAB — POCT UA - MICROSCOPIC ONLY
CASTS, UR, LPF, POC: NEGATIVE
Crystals, Ur, HPF, POC: NEGATIVE
EPITHELIAL CELLS, URINE PER MICROSCOPY: NEGATIVE
MUCUS UA: POSITIVE
RBC, urine, microscopic: NEGATIVE
YEAST UA: NEGATIVE

## 2013-12-26 LAB — LIPID PANEL
Cholesterol: 199 mg/dL (ref 0–200)
HDL: 45 mg/dL (ref >39)
LDL CALC: 116 mg/dL — AB (ref 0–99)
TRIGLYCERIDES: 190 mg/dL — AB (ref <150)
Total CHOL/HDL Ratio: 4.4 Ratio
VLDL: 38 mg/dL (ref 0–40)

## 2013-12-26 LAB — POCT URINALYSIS DIPSTICK
Bilirubin, UA: NEGATIVE
Glucose, UA: NEGATIVE
Ketones, UA: NEGATIVE
LEUKOCYTES UA: NEGATIVE
NITRITE UA: NEGATIVE
PH UA: 5.5
PROTEIN UA: NEGATIVE
RBC UA: NEGATIVE
Spec Grav, UA: 1.02
UROBILINOGEN UA: 0.2

## 2013-12-26 LAB — TSH: TSH: 1.857 u[IU]/mL (ref 0.350–4.500)

## 2013-12-26 MED ORDER — BYSTOLIC 20 MG PO TABS: 1.0000 | ORAL_TABLET | Freq: Every day | ORAL | Status: DC

## 2013-12-26 MED ORDER — SULFAMETHOXAZOLE-TMP DS 800-160 MG PO TABS: 1.0000 | ORAL_TABLET | Freq: Two times a day (BID) | ORAL | Status: DC

## 2013-12-26 MED ORDER — LOSARTAN POTASSIUM 50 MG PO TABS: 50.0000 mg | ORAL_TABLET | Freq: Every day | ORAL | Status: DC

## 2013-12-26 NOTE — Progress Notes (Signed)
[facility] [address], [place] [address] [phone]  Date:  12/26/2013   Name:  Raymond Green   DOB:  [birth year]   MRN:  [id number]  PCP:  Tally DueGUEST, CHRIS WARREN, MD    Chief Complaint: Back Pain and Medication Refill   History of Present Illness:  Raymond Green is a 48 y.o. very pleasant male patient who presents with the following:  Has one month history of pain in left lower back.  Worse with sitting and straining to stool.  No radiation into extremities.  No dysuria, urgency or frequency or hematuria.  Says pain radiates around the side into the left abdomen.  No nausea, vomiting, or stool change.  Current on colonoscopy, no history of injury or overuse.  No neuro symptoms.   Hypertension not apparently well controlled on brevibloc.  Asymptomatic.  No labs in over two years.  Denies other complaint or health concern today.   Patient Active Problem List   Diagnosis Date Noted  . Gout 10/29/2013  . HYPERTENSION 03/02/2008  . ESOPHAGEAL STRICTURE 03/02/2008  . GERD 03/02/2008  . ANEMIA-IRON DEFICIENCY 12/13/2007  . HEMORRHOIDS-INTERNAL 12/13/2007  . HEMORRHOIDS-EXTERNAL 12/13/2007  . RECTAL BLEEDING 12/13/2007    Past Medical History  Diagnosis Date  . Hypertension   . OSA (obstructive sleep apnea)   . Anemia   . Positive PPD, treated 2000    INH  . Obesity   . Hemorrhoids   . LVH (left ventricular hypertrophy)     Dr. Donnie Ahoilley ( cardiology)  . Kidney insufficiency   . Vitamin D deficiency   . MVHQIONG(295.2Headache(784.0)     Past Surgical History  Procedure Laterality Date  . Hemorrhoid surgery    . Colonoscopy  12/28/2011    Procedure: COLONOSCOPY;  Surgeon: Rachael Feeaniel P Jacobs, MD;  Location: [facility];  Service: Endoscopy;  Laterality: N/A;    History  Substance Use Topics  . Smoking status: Never Smoker   . Smokeless tobacco: Not on file  . Alcohol Use: Yes     Comment: Sometimes    Family History  Problem Relation Age of Onset  .  Diabetes Mother   . Heart disease Mother   . Hypertension Mother   . Hypertension Father     Allergies  Allergen Reactions  . Lisinopril     Medication list has been reviewed and updated.  Current Outpatient Prescriptions on File Prior to Visit  Medication Sig Dispense Refill  . BYSTOLIC 20 MG TABS Take 1 tablet (20 mg total) by mouth daily. PATIENT NEEDS OFFICE VISIT FOR ADDITIONAL REFILLS  90 tablet  0  . amLODipine (NORVASC) 10 MG tablet Take 1 tablet (10 mg total) by mouth daily.  90 tablet  3  . cyclobenzaprine (FLEXERIL) 10 MG tablet Take 1 tablet (10 mg total) by mouth 3 (three) times daily as needed for muscle spasms.  30 tablet  0  . HYDROcodone-acetaminophen (NORCO) 5-325 MG per tablet Take 1-2 tablets by mouth every 4 (four) hours as needed for pain.  30 tablet  0  . indomethacin (INDOCIN) 50 MG capsule Take 1 capsule (50 mg total) by mouth 3 (three) times daily with meals.  30 capsule  1  . trimethoprim-polymyxin b (POLYTRIM) ophthalmic solution Place 1 drop into both eyes every 4 (four) hours.  10 mL  0   No current facility-administered medications on file prior to visit.    Review of Systems:  As per HPI, otherwise negative.  Physical Examination: Filed Vitals:   12/26/13 1000  BP: 168/100  Pulse: 71  Temp: 98.6 F (37 C)  Resp: 16   Filed Vitals:   12/26/13 1000  Height: 6' (1.829 m)  Weight: 257 lb (116.574 kg)   Body mass index is 34.85 kg/(m^2). Ideal Body Weight: Weight in (lb) to have BMI = 25: 183.9  GEN: WDWN, NAD, Non-toxic, A & O x 3 HEENT: Atraumatic, Normocephalic. Neck supple. No masses, No LAD. Ears and Nose: No external deformity. CV: RRR, No M/G/R. No JVD. No thrill. No extra heart sounds. PULM: CTA B, no wheezes, crackles, rhonchi. No retractions. No resp. distress. No accessory muscle use. ABD: S, NT, ND, +BS. No rebound. No HSM. EXTR: No c/c/e NEURO Normal gait.  PSYCH: Normally interactive. Conversant. Not depressed or  anxious appearing.  Calm demeanor.    Assessment and Plan: Hypertension  Add lisinopril Labs UTI Septra  Signed,  Phillips Odor, MD   Results for orders placed in visit on 12/26/13  POCT UA - MICROSCOPIC ONLY      Result Value Ref Range   WBC, Ur, HPF, POC 1-3     RBC, urine, microscopic neg     Bacteria, U Microscopic trace     Mucus, UA pos     Epithelial cells, urine per micros neg     Crystals, Ur, HPF, POC neg     Casts, Ur, LPF, POC neg     Yeast, UA neg    POCT URINALYSIS DIPSTICK      Result Value Ref Range   Color, UA yellow     Clarity, UA clear     Glucose, UA neg     Bilirubin, UA neg     Ketones, UA neg     Spec Grav, UA 1.020     Blood, UA neg     pH, UA 5.5     Protein, UA neg     Urobilinogen, UA 0.2     Nitrite, UA neg     Leukocytes, UA Negative

## 2013-12-26 NOTE — Patient Instructions (Signed)
Hypertension Hypertension, commonly called high blood pressure, is when the force of blood pumping through your arteries is too strong. Your arteries are the blood vessels that carry blood from your heart throughout your body. A blood pressure reading consists of a higher number over a lower number, such as 110/72. The higher number (systolic) is the pressure inside your arteries when your heart pumps. The lower number (diastolic) is the pressure inside your arteries when your heart relaxes. Ideally you want your blood pressure below 120/80. Hypertension forces your heart to work harder to pump blood. Your arteries may become narrow or stiff. Having hypertension puts you at risk for heart disease, stroke, and other problems.  RISK FACTORS Some risk factors for high blood pressure are controllable. Others are not.  Risk factors you cannot control include:   Race. You may be at higher risk if you are African American.  Age. Risk increases with age.  Gender. Men are at higher risk than women before age 45 years. After age 65, women are at higher risk than men. Risk factors you can control include:  Not getting enough exercise or physical activity.  Being overweight.  Getting too much fat, sugar, calories, or salt in your diet.  Drinking too much alcohol. SIGNS AND SYMPTOMS Hypertension does not usually cause signs or symptoms. Extremely high blood pressure (hypertensive crisis) may cause headache, anxiety, shortness of breath, and nosebleed. DIAGNOSIS  To check if you have hypertension, your health care provider will measure your blood pressure while you are seated, with your arm held at the level of your heart. It should be measured at least twice using the same arm. Certain conditions can cause a difference in blood pressure between your right and left arms. A blood pressure reading that is higher than normal on one occasion does not mean that you need treatment. If one blood pressure reading  is high, ask your health care provider about having it checked again. TREATMENT  Treating high blood pressure includes making lifestyle changes and possibly taking medication. Living a healthy lifestyle can help lower high blood pressure. You may need to change some of your habits. Lifestyle changes may include:  Following the DASH diet. This diet is high in fruits, vegetables, and whole grains. It is low in salt, red meat, and added sugars.  Getting at least 2 1/2 hours of brisk physical activity every week.  Losing weight if necessary.  Not smoking.  Limiting alcoholic beverages.  Learning ways to reduce stress. If lifestyle changes are not enough to get your blood pressure under control, your health care provider may prescribe medicine. You may need to take more than one. Work closely with your health care provider to understand the risks and benefits. HOME CARE INSTRUCTIONS  Have your blood pressure rechecked as directed by your health care provider.   Only take medicine as directed by your health care provider. Follow the directions carefully. Blood pressure medicines must be taken as prescribed. The medicine does not work as well when you skip doses. Skipping doses also puts you at risk for problems.   Do not smoke.   Monitor your blood pressure at home as directed by your health care provider. SEEK MEDICAL CARE IF:   You think you are having a reaction to medicines taken.  You have recurrent headaches or feel dizzy.  You have swelling in your ankles.  You have trouble with your vision. SEEK IMMEDIATE MEDICAL CARE IF:  You develop a severe headache or   confusion.  You have unusual weakness, numbness, or feel faint.  You have severe chest or abdominal pain.  You vomit repeatedly.  You have trouble breathing. MAKE SURE YOU:   Understand these instructions.  Will watch your condition.  Will get help right away if you are not doing well or get  worse. Document Released: 06/05/2005 Document Revised: 06/10/2013 Document Reviewed: 03/28/2013 Exit[employer] Patient Information 2015 [employer]. This information is not intended to replace advice given to you by your health care provider. Make sure you discuss any questions you have with your health care provider.  

## 2013-12-27 LAB — PSA: PSA: 0.27 ng/mL (ref <=4.00)

## 2014-01-08 ENCOUNTER — Other Ambulatory Visit: Payer: Self-pay | Admitting: Emergency Medicine

## 2014-01-12 ENCOUNTER — Ambulatory Visit (INDEPENDENT_AMBULATORY_CARE_PROVIDER_SITE_OTHER): Payer: 59 | Admitting: Family Medicine

## 2014-01-12 VITALS — BP 140/86 | HR 50 | Temp 97.5°F | Resp 16 | Ht 71.5 in | Wt 257.6 lb

## 2014-01-12 DIAGNOSIS — Z7189 Other specified counseling: Secondary | ICD-10-CM

## 2014-01-12 DIAGNOSIS — Z23 Encounter for immunization: Secondary | ICD-10-CM

## 2014-01-12 DIAGNOSIS — Z7184 Encounter for health counseling related to travel: Secondary | ICD-10-CM

## 2014-01-12 DIAGNOSIS — I10 Essential (primary) hypertension: Secondary | ICD-10-CM

## 2014-01-12 MED ORDER — BYSTOLIC 20 MG PO TABS: 1.0000 | ORAL_TABLET | Freq: Every day | ORAL | Status: DC

## 2014-01-12 NOTE — Progress Notes (Signed)
[facility] [address], [place] [address] [phone]  Date:  01/12/2014   Name:  Raymond RobinsonsGuillaume Voorhees   DOB:  1965-10-07   MRN:  [id number]  PCP:  Tally DueGUEST, CHRIS WARREN, MD    Chief Complaint: Medication Refill and Immunizations   History of Present Illness:  Raymond Green is a 48 y.o. very pleasant male patient who presents with the following:  [place] be [place] for a month. Would like to get a meningitis shot.   His tetanus is up to date per his report.  [facility] and has an rx for malaria meds and will get his yellow fever shot- they just did not have the meningitis vaccine available.   He has never had a meningitis vaccine and would like to do this today  Added losartan to his meds at last visit.  He has not noted any issues with allergy to this medication.  He is also taking bystolic for his BP  Patient Active Problem List   Diagnosis Date Noted  . Gout 10/29/2013  . HYPERTENSION 03/02/2008  . ESOPHAGEAL STRICTURE 03/02/2008  . GERD 03/02/2008  . ANEMIA-IRON DEFICIENCY 12/13/2007  . HEMORRHOIDS-INTERNAL 12/13/2007  . HEMORRHOIDS-EXTERNAL 12/13/2007  . RECTAL BLEEDING 12/13/2007    Past Medical History  Diagnosis Date  . Hypertension   . OSA (obstructive sleep apnea)   . Anemia   . Positive PPD, treated 2000    INH  . Obesity   . Hemorrhoids   . LVH (left ventricular hypertrophy)     Dr. Donnie Ahoilley ( cardiology)  . Kidney insufficiency   . Vitamin D deficiency   . PPIRJJOA(416.6Headache(784.0)     Past Surgical History  Procedure Laterality Date  . Hemorrhoid surgery    . Colonoscopy  12/28/2011    Procedure: COLONOSCOPY;  Surgeon: Rachael Feeaniel P Jacobs, MD;  Location: [facility];  Service: Endoscopy;  Laterality: N/A;    History  Substance Use Topics  . Smoking status: Never Smoker   . Smokeless tobacco: Not on file  . Alcohol Use: Yes     Comment: Sometimes    Family  History  Problem Relation Age of Onset  . Diabetes Mother   . Heart disease Mother   . Hypertension Mother   . Hypertension Father     Allergies  Allergen Reactions  . Lisinopril     Medication list has been reviewed and updated.  Current Outpatient Prescriptions on File Prior to Visit  Medication Sig Dispense Refill  . cyclobenzaprine (FLEXERIL) 10 MG tablet Take 1 tablet (10 mg total) by mouth 3 (three) times daily as needed for muscle spasms.  30 tablet  0  . losartan (COZAAR) 50 MG tablet Take 1 tablet (50 mg total) by mouth daily.  90 tablet  3  . sulfamethoxazole-trimethoprim (BACTRIM DS) 800-160 MG per tablet Take 1 tablet by mouth 2 (two) times daily.  20 tablet  0  . amLODipine (NORVASC) 10 MG tablet Take 1 tablet (10 mg total) by mouth daily.  90 tablet  3  . BYSTOLIC 20 MG TABS Take 1 tablet (20 mg total) by mouth daily. PATIENT NEEDS OFFICE VISIT FOR ADDITIONAL REFILLS  90 tablet  1  . HYDROcodone-acetaminophen (NORCO) 5-325 MG per tablet Take 1-2 tablets by mouth every 4 (four) hours as needed for pain.  30 tablet  0  . indomethacin (INDOCIN) 50 MG capsule Take 1 capsule (  50 mg total) by mouth 3 (three) times daily with meals.  30 capsule  1  . trimethoprim-polymyxin b (POLYTRIM) ophthalmic solution Place 1 drop into both eyes every 4 (four) hours.  10 mL  0   No current facility-administered medications on file prior to visit.    Review of Systems:  As per HPI- otherwise negative.   Physical Examination: Filed Vitals:   01/12/14 1252  BP: 154/90  Pulse: 50  Temp: 97.5 F (36.4 C)  Resp: 16   Filed Vitals:   01/12/14 1252  Height: 5' 11.5" (1.816 m)  Weight: 257 lb 9.6 oz (116.847 kg)   Body mass index is 35.43 kg/(m^2). Ideal Body Weight: Weight in (lb) to have BMI = 25: 181.4  GEN: WDWN, NAD, Non-toxic, A & O x 3, overweight, looks well HEENT: Atraumatic, Normocephalic. Neck supple. No masses, No LAD. Ears and Nose: No external deformity. CV: RRR,  No M/G/R. No JVD. No thrill. No extra heart sounds. PULM: CTA B, no wheezes, crackles, rhonchi. No retractions. No resp. distress. No accessory muscle use. EXTR: No c/c/e NEURO Normal gait.  PSYCH: Normally interactive. Conversant. Not depressed or anxious appearing.  Calm demeanor.    Assessment and Plan: Essential hypertension - Plan: BYSTOLIC 20 MG TABS  Travel advice encounter - Plan: Meningococcal conjugate vaccine 4-valent IM  menveo today.  Refilled his bystolic He has enough of his other meds Follow-up at [facility] for yellow fever, etc.    Signed Abbe [place], MD

## 2014-01-12 NOTE — Patient Instructions (Signed)
Be sure to get your yellow fever vaccine and malaria medication from the health department.   I send in a year's rx for your bystolic today.  Take care and have a wonderful trip to visit your family!

## 2014-01-21 DIAGNOSIS — Z0271 Encounter for disability determination: Secondary | ICD-10-CM

## 2014-03-19 ENCOUNTER — Ambulatory Visit (INDEPENDENT_AMBULATORY_CARE_PROVIDER_SITE_OTHER): Payer: 59 | Admitting: Physician Assistant

## 2014-03-19 VITALS — BP 162/102 | HR 56 | Temp 98.5°F | Resp 18 | Ht 71.0 in | Wt 261.4 lb

## 2014-03-19 DIAGNOSIS — B9789 Other viral agents as the cause of diseases classified elsewhere: Principal | ICD-10-CM

## 2014-03-19 DIAGNOSIS — J069 Acute upper respiratory infection, unspecified: Secondary | ICD-10-CM

## 2014-03-19 DIAGNOSIS — I10 Essential (primary) hypertension: Secondary | ICD-10-CM

## 2014-03-19 MED ORDER — BENZONATATE 100 MG PO CAPS: 100.0000 mg | ORAL_CAPSULE | Freq: Three times a day (TID) | ORAL | Status: DC | PRN

## 2014-03-19 MED ORDER — GUAIFENESIN ER 1200 MG PO TB12: 1.0000 | ORAL_TABLET | Freq: Two times a day (BID) | ORAL | Status: DC | PRN

## 2014-03-19 MED ORDER — LOSARTAN POTASSIUM 100 MG PO TABS: 100.0000 mg | ORAL_TABLET | Freq: Every day | ORAL | Status: DC

## 2014-03-19 NOTE — Progress Notes (Signed)
   Subjective:    Patient ID: Raymond Green, male    DOB: December 21, 1965, 48 y.o.   MRN: [id number]  Cough Associated symptoms include a sore throat. Pertinent negatives include no chest pain, chills, ear pain, fever or headaches.  This is a 48 year old male with a PMH of hypertension and LVH here today for a chief complaint of cough.  He states the symptoms began with sneezing two days ago while he was at [place] after [place].  This has progressed to a runny-nose, fatigue, cough, and sore throat.  The cough is worse during the day, producing yellow sputum from the back of his throat.  He is unaware of any sick contacts.  He denies fever, headache, photophobia, sinus pressure, ear pain, chills, nausea, vomiting, or diarrhea. He is getting adequate rest and taking his anti-hypertensive medication as prescribed.    Review of Systems  Constitutional: Positive for fatigue. Negative for fever and chills.  HENT: Positive for sneezing and sore throat. Negative for ear pain, facial swelling and sinus pressure.   Respiratory: Positive for cough.   Cardiovascular: Negative for chest pain.  Gastrointestinal: Negative for abdominal pain and diarrhea.  Neurological: Negative for light-headedness and headaches.       Objective:   Physical Exam  Constitutional: He is oriented to person, place, and time.  HENT:  Head: Normocephalic and atraumatic.  Right Ear: Tympanic membrane is not erythematous and not bulging.  Left Ear: Tympanic membrane is not erythematous and not bulging.  Nose: Mucosal edema and rhinorrhea present. Right sinus exhibits no maxillary sinus tenderness and no frontal sinus tenderness. Left sinus exhibits no maxillary sinus tenderness and no frontal sinus tenderness.  Mouth/Throat: No posterior oropharyngeal edema or posterior oropharyngeal erythema.  Eyes: Conjunctivae are normal. Pupils are equal, round, and reactive to light.  Right eye exhibits no discharge. Left eye exhibits no discharge.  Neck: Normal range of motion. Neck supple. No thyromegaly present.  Cardiovascular: Normal rate, regular rhythm and normal heart sounds.   Pulmonary/Chest: Effort normal and breath sounds normal. No respiratory distress. He has no wheezes.  Neurological: He is alert and oriented to person, place, and time.  Skin: Skin is warm and dry.  Psychiatric: He has a normal mood and affect. His behavior is normal. Judgment and thought content normal.   BP 162/102  Pulse 56  Temp(Src) 98.5 F (36.9 C) (Oral)  Resp 18  Ht 5\' 11"  (1.803 m)  Wt 261 lb 6.4 oz (118.57 kg)  BMI 36.47 kg/m2  SpO2 98%       Assessment & Plan:  Viral URI with cough - Plan: Guaifenesin (MUCINEX MAXIMUM STRENGTH) 1200 MG TB12, benzonatate (TESSALON PERLES) 100 MG capsule Symptomatic care discussed with patient.  He will follow up if his symptoms worsen or do not resolve in one week.  Essential hypertension - Patient's HTN is uncontrolled, he is asymptomatic though.  Will increase the losartan tonight.  Patient will recheck in 1 month.  Plan: losartan (COZAAR) 100 MG tablet  Trena PlattStephanie English, PA-C [facility] 10/1/20158:27 PM

## 2014-03-19 NOTE — Progress Notes (Signed)
I was directly involved with the patient's care and agree with the physical, diagnosis and treatment plan.  

## 2014-03-19 NOTE — Patient Instructions (Signed)
Stay hydrated and get plenty of rest. Tylenol or ibuprofen as needed.   We increased your blood pressure medicine today (cozaar).  We want to see you in a month to make sure it is working.

## 2014-03-23 ENCOUNTER — Ambulatory Visit (INDEPENDENT_AMBULATORY_CARE_PROVIDER_SITE_OTHER): Payer: 59 | Admitting: *Deleted

## 2014-03-23 DIAGNOSIS — Z23 Encounter for immunization: Secondary | ICD-10-CM

## 2014-03-30 ENCOUNTER — Ambulatory Visit (INDEPENDENT_AMBULATORY_CARE_PROVIDER_SITE_OTHER): Payer: 59 | Admitting: Internal Medicine

## 2014-03-30 ENCOUNTER — Ambulatory Visit (INDEPENDENT_AMBULATORY_CARE_PROVIDER_SITE_OTHER): Payer: 59

## 2014-03-30 VITALS — BP 136/90 | HR 57 | Temp 98.7°F | Resp 16 | Ht 72.0 in | Wt 259.8 lb

## 2014-03-30 DIAGNOSIS — M10072 Idiopathic gout, left ankle and foot: Secondary | ICD-10-CM

## 2014-03-30 DIAGNOSIS — M79675 Pain in left toe(s): Secondary | ICD-10-CM

## 2014-03-30 DIAGNOSIS — M109 Gout, unspecified: Secondary | ICD-10-CM

## 2014-03-30 LAB — POCT CBC
GRANULOCYTE PERCENT: 63.9 % (ref 37–80)
HEMATOCRIT: 38.3 % — AB (ref 43.5–53.7)
Hemoglobin: 12.9 g/dL — AB (ref 14.1–18.1)
Lymph, poc: 2.4 (ref 0.6–3.4)
MCH, POC: 29.8 pg (ref 27–31.2)
MCHC: 33.7 g/dL (ref 31.8–35.4)
MCV: 88.3 fL (ref 80–97)
MID (cbc): 0.5 (ref 0–0.9)
MPV: 8.7 fL (ref 0–99.8)
POC GRANULOCYTE: 5.2 (ref 2–6.9)
POC LYMPH PERCENT: 30.2 %L (ref 10–50)
POC MID %: 5.9 %M (ref 0–12)
Platelet Count, POC: 170 10*3/uL (ref 142–424)
RBC: 4.34 M/uL — AB (ref 4.69–6.13)
RDW, POC: 14 %
WBC: 8.1 10*3/uL (ref 4.6–10.2)

## 2014-03-30 LAB — POCT SEDIMENTATION RATE: POCT SED RATE: 46 mm/h — AB (ref 0–22)

## 2014-03-30 LAB — URIC ACID: Uric Acid, Serum: 9 mg/dL — ABNORMAL HIGH (ref 4.0–7.8)

## 2014-03-30 MED ORDER — PREDNISONE 20 MG PO TABS: ORAL_TABLET | ORAL | Status: DC

## 2014-03-30 MED ORDER — INDOMETHACIN 50 MG PO CAPS: 50.0000 mg | ORAL_CAPSULE | Freq: Three times a day (TID) | ORAL | Status: DC

## 2014-03-30 NOTE — Progress Notes (Signed)
Subjective:    Patient ID: Raymond Green, male    DOB: [birth year], 48 y.o.   MRN: [id number] This chart was scribed for Ellamae Siaobert Shakai Dolley, MD by Julian HyMorgan Graham, ED Scribe. The patient was seen in Room 4. The patient's care was started at 2:23 PM.   03/30/2014  Toe Pain   Toe Pain    HPI Comments: Raymond Green is a 48 y.o. male who presents to the [facility] complaining of acute, moderate, gradually worsening left, great toe pain onset 8 hours ago. Pt has associated swelling. Pt denies injury or trauma. Pt notes his pain is worsened with bearing weight. Pt has a hx of Gout with his last episode approximately 6 months ago in the left, great toe as well. [facility] and treated with Indocin 50 MG. He denies receiving imaging of his toe. Pt denies family hx of gout. He notes his Gout began when he moved [place] the [place]. Pt denies any other symptoms at this time.   curr prob 1)HTN 2)anemia 3)hemorrhoids bystolic and cozaar  Review of Systems  Constitutional: Negative for fever and chills.  Gastrointestinal: Negative for nausea and vomiting.  Musculoskeletal: Positive for arthralgias and joint swelling.  Skin: Negative for color change.   Works in endoscopy standing  Past Medical History  Diagnosis Date  . Hypertension   . OSA (obstructive sleep apnea)   . Anemia   . Positive PPD, treated 2000    INH  . Obesity   . Hemorrhoids   . LVH (left ventricular hypertrophy)     Dr. Donnie Ahoilley ( cardiology)  . Kidney insufficiency   . Vitamin D deficiency   . WUJWJXBJ(478.2Headache(784.0)    Past Surgical History  Procedure Laterality Date  . Hemorrhoid surgery    . Colonoscopy  12/28/2011    Procedure: COLONOSCOPY;  Surgeon: Rachael Feeaniel P Jacobs, MD;  Location: [facility];  Service: Endoscopy;  Laterality: N/A;   Allergies  Allergen Reactions  . Lisinopril     angioedema        Objective:  Triage Vitals: BP 136/90  Pulse 57  Temp(Src)  98.7 F (37.1 C) (Oral)  Resp 16  Ht 6' (1.829 m)  Wt 259 lb 12.8 oz (117.845 kg)  BMI 35.23 kg/m2  SpO2 100%  Physical Exam  Nursing note and vitals reviewed. Constitutional: He is oriented to person, place, and time. He appears well-developed and well-nourished. No distress.  HENT:  Head: Normocephalic and atraumatic.  Eyes: Conjunctivae and EOM are normal.  Neck: Neck supple.  Cardiovascular: Normal rate.   Pulmonary/Chest: Effort normal. No respiratory distress.  Musculoskeletal: Normal range of motion.  He is swollen and tender over left, great toe PIP. No erythema.  Neurological: He is alert and oriented to person, place, and time.  Skin: Skin is warm and dry.  Psychiatric: He has a normal mood and affect. His behavior is normal.    Results for orders placed in visit on 03/30/14  POCT CBC      Result Value Ref Range   WBC 8.1  4.6 - 10.2 K/uL   Lymph, poc 2.4  0.6 - 3.4   POC LYMPH PERCENT 30.2  10 - 50 %L   MID (cbc) 0.5  0 - 0.9   POC MID % 5.9  0 - 12 %M   POC Granulocyte 5.2  2 - 6.9   Granulocyte percent 63.9  37 - 80 %G   RBC 4.34 (*)  4.69 - 6.13 M/uL   Hemoglobin 12.9 (*) 14.1 - 18.1 g/dL   HCT, POC 16.138.3 (*) 09.643.5 - 53.7 %   MCV 88.3  80 - 97 fL   MCH, POC 29.8  27 - 31.2 pg   MCHC 33.7  31.8 - 35.4 g/dL   RDW, POC 04.514.0     Platelet Count, POC 170  142 - 424 K/uL   MPV 8.7  0 - 99.8 fL     [facility] reading (PRIMARY) by  Dr. Molly Maduroobert Junius Faucett=mild degen changes at MTP and PIP    Assessment & Plan:  2:29 PM- Patient informed of current plan for treatment and evaluation and agrees with plan at this time. I have completed the patient encounter in its entirety as documented by the scribe, with editing by me where necessary. Bellarose Burtt P. Merla Richesoolittle, M.D. Great toe pain, left - Plan: DG Toe Great Left  Gout, unspecified cause, unspecified chronicity, unspecified site - Plan: POCT CBC, POCT SEDIMENTATION RATE, Uric acid  Will check uric acid and then refresh diet  ideas vs meds  Meds ordered this encounter  Medications  . indomethacin (INDOCIN) 50 MG capsule    Sig: Take 1 capsule (50 mg total) by mouth 3 (three) times daily with meals.    Dispense:  30 capsule    Refill:  2  . predniSONE (DELTASONE) 20 MG tablet    Sig: Take 3 tabs today as single dose    Dispense:  3 tablet    Refill:  0

## 2014-04-06 ENCOUNTER — Encounter: Payer: Self-pay | Admitting: Internal Medicine

## 2014-04-22 ENCOUNTER — Ambulatory Visit (INDEPENDENT_AMBULATORY_CARE_PROVIDER_SITE_OTHER): Payer: 59 | Admitting: Physician Assistant

## 2014-04-22 VITALS — BP 142/84 | HR 55 | Temp 98.4°F | Resp 16 | Ht 72.0 in | Wt 254.8 lb

## 2014-04-22 DIAGNOSIS — J069 Acute upper respiratory infection, unspecified: Secondary | ICD-10-CM

## 2014-04-22 MED ORDER — HYDROCOD POLST-CHLORPHEN POLST 10-8 MG/5ML PO LQCR
5.0000 mL | Freq: Two times a day (BID) | ORAL | Status: DC | PRN
Start: 2014-04-22 — End: 2014-07-13

## 2014-04-22 MED ORDER — BENZONATATE 100 MG PO CAPS: 100.0000 mg | ORAL_CAPSULE | Freq: Three times a day (TID) | ORAL | Status: DC | PRN

## 2014-04-22 NOTE — Progress Notes (Signed)
Subjective:    Patient ID: Raymond Green, male    DOB: 01/28/1966, 48 y.o.   MRN: [id number]  HPI Patient presents with  8 days of hacking cough that is productive with clear phlegm. Cough is worse at night. Throat is irritated and feels like something is stuck in it. Has nasal congestion without rhinorrhea. 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 Denies fever, myalgias, HA, or wheezing. Has seasonal allergies, but no h/o asthma. [place] ago. No URI sx at that time. Has not tried any meds at this time.    Review of Systems  Constitutional: Negative for fever, chills, diaphoresis, activity change, appetite change and fatigue.  HENT: Positive for congestion and sore throat (stuck in throat). Negative for ear discharge, ear pain, postnasal drip, rhinorrhea, sinus pressure and sneezing.   Respiratory: Positive for cough (productive). Negative for choking, chest tightness, shortness of breath and wheezing.   Cardiovascular: Negative for chest pain and palpitations.  Gastrointestinal: Negative for nausea, vomiting and abdominal pain.  Musculoskeletal: Negative for myalgias, neck pain and neck stiffness.  Skin: Negative for rash.  Allergic/Immunologic: Positive for environmental allergies. Negative for food allergies.  Neurological: Negative for light-headedness and headaches.  Hematological: Negative for adenopathy.       Objective:   Physical Exam  Constitutional: He is oriented to person, place, and time. He appears well-developed and well-nourished. No distress.  Blood pressure 142/84, pulse 55, temperature 98.4 F (36.9 C), temperature source Oral, resp. rate 16, height 6' (1.829 m), weight 254 lb 12.8 oz (115.577 kg), SpO2 100 %.   HENT:  Head: Normocephalic and atraumatic.  Right Ear: External ear normal. No drainage, swelling or tenderness. Right ear foreign body: cerumen present.  Left Ear: External ear normal. No drainage,  swelling or tenderness. Tympanic membrane is not injected, not erythematous and not bulging.  Nose: Mucosal edema and rhinorrhea present. No sinus tenderness. Right sinus exhibits no maxillary sinus tenderness and no frontal sinus tenderness. Left sinus exhibits no maxillary sinus tenderness and no frontal sinus tenderness.  Mouth/Throat: Uvula is midline and mucous membranes are normal. Posterior oropharyngeal erythema (minimal) present. No oropharyngeal exudate or posterior oropharyngeal edema.  Eyes: Conjunctivae and EOM are normal. Pupils are equal, round, and reactive to light. Right eye exhibits no discharge. Left eye exhibits no discharge. No scleral icterus.  Neck: Normal range of motion. Neck supple.  Cardiovascular: Normal rate, regular rhythm and normal heart sounds.  Exam reveals no gallop and no friction rub.   No murmur heard. Pulmonary/Chest: Effort normal and breath sounds normal. No respiratory distress. He has no wheezes. He has no rales. He exhibits no tenderness.  Abdominal: Soft. Bowel sounds are normal. There is no tenderness.  Musculoskeletal: He exhibits no edema.  Lymphadenopathy:    He has no cervical adenopathy.  Neurological: He is alert and oriented to person, place, and time.  Skin: Skin is warm and dry. No rash noted. He is not diaphoretic. No erythema. No pallor.        Assessment & Plan:  1. Viral URI with cough - benzonatate (TESSALON PERLES) 100 MG capsule; Take 1-2 capsules (100-200 mg total) by mouth 3 (three) times daily as needed for cough.  Dispense: 40 capsule; Refill: 0 - chlorpheniramine-HYDROcodone (TUSSIONEX PENNKINETIC ER) 10-8 MG/5ML LQCR; Take 5 mLs by mouth every 12 (twelve) hours as needed for cough (cough).  Dispense: 50 mL; Refill: 0 - Mucinex over the counter of congestion. -  Plenty of fluids and rest.   Janan Ridgeishira Danyael Alipio PA-C  [facility] 04/22/2014 5:58 PM

## 2014-04-22 NOTE — Patient Instructions (Signed)
1. Purchase Mucinex over the counter. 2. Get plenty of fluid and rest.  Upper Respiratory Infection, Adult An upper respiratory infection (URI) is also sometimes known as the common cold. The upper respiratory tract includes the nose, sinuses, throat, trachea, and bronchi. Bronchi are the airways leading to the lungs. Most people improve within 1 week, but symptoms can last up to 2 weeks. A residual cough may last even longer.  CAUSES Many different viruses can infect the tissues lining the upper respiratory tract. The tissues become irritated and inflamed and often become very moist. Mucus production is also common. A cold is contagious. You can easily spread the virus to others by oral contact. This includes kissing, sharing a glass, coughing, or sneezing. Touching your mouth or nose and then touching a surface, which is then touched by another person, can also spread the virus. SYMPTOMS  Symptoms typically develop 1 to 3 days after you come in contact with a cold virus. Symptoms vary from person to person. They may include:  Runny nose.  Sneezing.  Nasal congestion.  Sinus irritation.  Sore throat.  Loss of voice (laryngitis).  Cough.  Fatigue.  Muscle aches.  Loss of appetite.  Headache.  Low-grade fever. DIAGNOSIS  You might diagnose your own cold based on familiar symptoms, since most people get a cold 2 to 3 times a year. Your caregiver can confirm this based on your exam. Most importantly, your caregiver can check that your symptoms are not due to another disease such as strep throat, sinusitis, pneumonia, asthma, or epiglottitis. Blood tests, throat tests, and X-rays are not necessary to diagnose a common cold, but they may sometimes be helpful in excluding other more serious diseases. Your caregiver will decide if any further tests are required. RISKS AND COMPLICATIONS  You may be at risk for a more severe case of the common cold if you smoke cigarettes, have chronic  heart disease (such as heart failure) or lung disease (such as asthma), or if you have a weakened immune system. The very young and very old are also at risk for more serious infections. Bacterial sinusitis, middle ear infections, and bacterial pneumonia can complicate the common cold. The common cold can worsen asthma and chronic obstructive pulmonary disease (COPD). Sometimes, these complications can require emergency medical care and may be life-threatening. PREVENTION  The best way to protect against getting a cold is to practice good hygiene. Avoid oral or hand contact with people with cold symptoms. Wash your hands often if contact occurs. There is no clear evidence that vitamin C, vitamin E, echinacea, or exercise reduces the chance of developing a cold. However, it is always recommended to get plenty of rest and practice good nutrition. TREATMENT  Treatment is directed at relieving symptoms. There is no cure. Antibiotics are not effective, because the infection is caused by a virus, not by bacteria. Treatment may include:  Increased fluid intake. Sports drinks offer valuable electrolytes, sugars, and fluids.  Breathing heated mist or steam (vaporizer or shower).  Eating chicken soup or other clear broths, and maintaining good nutrition.  Getting plenty of rest.  Using gargles or lozenges for comfort.  Controlling fevers with ibuprofen or acetaminophen as directed by your caregiver.  Increasing usage of your inhaler if you have asthma. Zinc gel and zinc lozenges, taken in the first 24 hours of the common cold, can shorten the duration and lessen the severity of symptoms. Pain medicines may help with fever, muscle aches, and throat pain. A  variety of non-prescription medicines are available to treat congestion and runny nose. Your caregiver can make recommendations and may suggest nasal or lung inhalers for other symptoms.  HOME CARE INSTRUCTIONS   Only take over-the-counter or  prescription medicines for pain, discomfort, or fever as directed by your caregiver.  Use a warm mist humidifier or inhale steam from a shower to increase air moisture. This may keep secretions moist and make it easier to breathe.  Drink enough water and fluids to keep your urine clear or pale yellow.  Rest as needed.  Return to work when your temperature has returned to normal or as your caregiver advises. You may need to stay home longer to avoid infecting others. You can also use a face mask and careful hand washing to prevent spread of the virus. SEEK MEDICAL CARE IF:   After the first few days, you feel you are getting worse rather than better.  You need your caregiver's advice about medicines to control symptoms.  You develop chills, worsening shortness of breath, or brown or red sputum. These may be signs of pneumonia.  You develop yellow or brown nasal discharge or pain in the face, especially when you bend forward. These may be signs of sinusitis.  You develop a fever, swollen neck glands, pain with swallowing, or white areas in the back of your throat. These may be signs of strep throat. SEEK IMMEDIATE MEDICAL CARE IF:   You have a fever.  You develop severe or persistent headache, ear pain, sinus pain, or chest pain.  You develop wheezing, a prolonged cough, cough up blood, or have a change in your usual mucus (if you have chronic lung disease).  You develop sore muscles or a stiff neck. Document Released: 11/29/2000 Document Revised: 08/28/2011 Document Reviewed: 09/10/2013 [facility] Patient Information 2015 [employer] [place]. This information is not intended to replace advice given to you by your health care provider. Make sure you discuss any questions you have with your health care provider.

## 2014-04-22 NOTE — Progress Notes (Signed)
I have discussed this case with Ms. Mitzi DavenportBrewington, PA-C and agree. If sensation of something stuck in the throat persists, consider evaluation for LPR.

## 2014-06-19 DIAGNOSIS — K2960 Other gastritis without bleeding: Secondary | ICD-10-CM

## 2014-06-19 HISTORY — DX: Other gastritis without bleeding: K29.60

## 2014-07-13 ENCOUNTER — Ambulatory Visit (INDEPENDENT_AMBULATORY_CARE_PROVIDER_SITE_OTHER): Payer: 59

## 2014-07-13 ENCOUNTER — Ambulatory Visit (INDEPENDENT_AMBULATORY_CARE_PROVIDER_SITE_OTHER): Payer: 59 | Admitting: Family Medicine

## 2014-07-13 VITALS — BP 158/100 | HR 75 | Temp 98.2°F | Resp 16 | Ht 72.0 in | Wt 257.0 lb

## 2014-07-13 DIAGNOSIS — R1012 Left upper quadrant pain: Secondary | ICD-10-CM

## 2014-07-13 DIAGNOSIS — G8929 Other chronic pain: Secondary | ICD-10-CM

## 2014-07-13 DIAGNOSIS — I10 Essential (primary) hypertension: Secondary | ICD-10-CM

## 2014-07-13 DIAGNOSIS — R109 Unspecified abdominal pain: Principal | ICD-10-CM

## 2014-07-13 DIAGNOSIS — K5909 Other constipation: Secondary | ICD-10-CM

## 2014-07-13 LAB — POCT CBC
Granulocyte percent: 52 %G (ref 37–80)
HCT, POC: 41.2 % — AB (ref 43.5–53.7)
Hemoglobin: 13.5 g/dL — AB (ref 14.1–18.1)
Lymph, poc: 2.9 (ref 0.6–3.4)
MCH, POC: 29.2 pg (ref 27–31.2)
MCHC: 32.8 g/dL (ref 31.8–35.4)
MCV: 88.9 fL (ref 80–97)
MID (cbc): 0.5 (ref 0–0.9)
MPV: 9.1 fL (ref 0–99.8)
POC Granulocyte: 3.7 (ref 2–6.9)
POC LYMPH PERCENT: 41.2 %L (ref 10–50)
POC MID %: 6.8 %M (ref 0–12)
Platelet Count, POC: 160 10*3/uL (ref 142–424)
RBC: 4.63 M/uL — AB (ref 4.69–6.13)
RDW, POC: 14 %
WBC: 7.1 10*3/uL (ref 4.6–10.2)

## 2014-07-13 LAB — POCT UA - MICROSCOPIC ONLY
Casts, Ur, LPF, POC: NEGATIVE
Crystals, Ur, HPF, POC: NEGATIVE
Mucus, UA: NEGATIVE
Yeast, UA: NEGATIVE

## 2014-07-13 LAB — POCT URINALYSIS DIPSTICK
Bilirubin, UA: NEGATIVE
Blood, UA: NEGATIVE
Glucose, UA: NEGATIVE
Ketones, UA: NEGATIVE
Leukocytes, UA: NEGATIVE
Nitrite, UA: NEGATIVE
Spec Grav, UA: 1.025
Urobilinogen, UA: 0.2
pH, UA: 5.5

## 2014-07-13 MED ORDER — POLYETHYLENE GLYCOL 3350 17 GM/SCOOP PO POWD: 17.0000 g | Freq: Two times a day (BID) | ORAL | Status: DC | PRN

## 2014-07-13 MED ORDER — BYSTOLIC 20 MG PO TABS: 1.0000 | ORAL_TABLET | Freq: Every day | ORAL | Status: DC

## 2014-07-13 NOTE — Addendum Note (Signed)
Addended by: Elvina SidleLAUENSTEIN, Xzayvion Vaeth on: 07/13/2014 06:23 PM   Modules accepted: Orders

## 2014-07-13 NOTE — Progress Notes (Addendum)
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  Cannot lie on left side.  No blood in urine No change in appetitie.  Originally from [place]  Objective:  NAD Chest:  Clear Heart: reg, no murmur Abdomen:  Soft, nontender with no HSM or mass, no CVAT  Results for orders placed or performed in visit on 07/13/14  POCT CBC  Result Value Ref Range   WBC 7.1 4.6 - 10.2 K/uL   Lymph, poc 2.9 0.6 - 3.4   POC LYMPH PERCENT 41.2 10 - 50 %L   MID (cbc) 0.5 0 - 0.9   POC MID % 6.8 0 - 12 %M   POC Granulocyte 3.7 2 - 6.9   Granulocyte percent 52.0 37 - 80 %G   RBC 4.63 (A) 4.69 - 6.13 M/uL   Hemoglobin 13.5 (A) 14.1 - 18.1 g/dL   HCT, POC 96.041.2 (A) 45.443.5 - 53.7 %   MCV 88.9 80 - 97 fL   MCH, POC 29.2 27 - 31.2 pg   MCHC 32.8 31.8 - 35.4 g/dL   RDW, POC 09.814.0 %   Platelet Count, POC 160 142 - 424 K/uL   MPV 9.1 0 - 99.8 fL  POCT urinalysis dipstick  Result Value Ref Range   Color, UA yellow    Clarity, UA clear    Glucose, UA neg    Bilirubin, UA neg    Ketones, UA neg    Spec Grav, UA 1.025    Blood, UA neg    pH, UA 5.5    Protein, UA trace    Urobilinogen, UA 0.2    Nitrite, UA neg    Leukocytes, UA Negative   POCT UA - Microscopic Only  Result Value Ref Range   WBC, Ur, HPF, POC 1-3    RBC, urine, microscopic 0-2    Bacteria, U Microscopic trace    Mucus, UA neg    Epithelial cells, urine per micros 1-3    Crystals, Ur, HPF, POC neg    Casts, Ur, LPF, POC neg    Yeast, UA neg     [facility] reading (PRIMARY) by  Dr. Milus GlazierLauenstein: Moderate stool burden in both ascending and descending colon.  This chart was scribed in my presence and reviewed by me personally.    ICD-9-CM ICD-10-CM   1. Left flank pain, chronic 789.09 R10.12 POCT CBC   338.29 G89.29 Comprehensive metabolic panel     POCT urinalysis dipstick     POCT UA - Microscopic Only     DG Abd Acute W/Chest     polyethylene glycol powder  (GLYCOLAX/MIRALAX) powder  2. Other constipation 564.09 K59.09 polyethylene glycol powder (GLYCOLAX/MIRALAX) powder  3. Essential hypertension 401.9 I10 BYSTOLIC 20 MG TABS     Signed, Elvina SidleKurt Khoen Genet, MD .

## 2014-07-13 NOTE — Patient Instructions (Signed)

## 2014-07-14 LAB — COMPREHENSIVE METABOLIC PANEL
ALT: 14 U/L (ref 0–53)
AST: 19 U/L (ref 0–37)
Albumin: 4.2 g/dL (ref 3.5–5.2)
Alkaline Phosphatase: 47 U/L (ref 39–117)
BUN: 15 mg/dL (ref 6–23)
CO2: 27 mEq/L (ref 19–32)
Calcium: 9.7 mg/dL (ref 8.4–10.5)
Chloride: 104 mEq/L (ref 96–112)
Creat: 1.59 mg/dL — ABNORMAL HIGH (ref 0.50–1.35)
Glucose, Bld: 82 mg/dL (ref 70–99)
Potassium: 4.2 mEq/L (ref 3.5–5.3)
Sodium: 139 mEq/L (ref 135–145)
Total Bilirubin: 0.5 mg/dL (ref 0.2–1.2)
Total Protein: 7.2 g/dL (ref 6.0–8.3)

## 2014-07-30 ENCOUNTER — Encounter: Payer: Self-pay | Admitting: Family Medicine

## 2014-07-30 ENCOUNTER — Ambulatory Visit (INDEPENDENT_AMBULATORY_CARE_PROVIDER_SITE_OTHER): Payer: 59 | Admitting: Family Medicine

## 2014-07-30 VITALS — BP 183/103 | HR 57 | Temp 98.0°F | Resp 16 | Ht 71.0 in | Wt 256.0 lb

## 2014-07-30 DIAGNOSIS — I10 Essential (primary) hypertension: Secondary | ICD-10-CM

## 2014-07-30 DIAGNOSIS — J0140 Acute pansinusitis, unspecified: Secondary | ICD-10-CM

## 2014-07-30 DIAGNOSIS — K921 Melena: Secondary | ICD-10-CM

## 2014-07-30 MED ORDER — AMOXICILLIN 875 MG PO TABS: 875.0000 mg | ORAL_TABLET | Freq: Two times a day (BID) | ORAL | Status: DC

## 2014-07-30 NOTE — Progress Notes (Signed)
This chart was scribed for Elvina Sidle, MD by Tonye Royalty, ED Scribe.  Patient ID: [name], DOB: 1965/09/29, 49 y.o. Date of Encounter: 07/30/2014, 4:07 PM  Primary Physician: Tally Due, MD  Chief Complaint:  Chief Complaint  Patient presents with   Follow-up   Constipation   Hypertension     HPI: 49 y.o. year old male with history below presents for follow up for constipation. He states it has improved after using Miralax every day and he no longer has pain. He states the size of his bowel movements have not increased and are normal. He states he saw some blood in his stool recently, not just on wiping. He states he had hemorrhoid removal in 2001 with Dr. Magnus Ivan. He denies new problems and states his appetite is good.   He also complains of "cold" with onset 1 week ago with sinus congestion. He states he has had associated epistaxis.  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  Past Medical History  Diagnosis Date   Hypertension    OSA (obstructive sleep apnea)    Anemia    Positive PPD, treated 2000    INH   Obesity    Hemorrhoids    LVH (left ventricular hypertrophy)     Dr. Donnie Aho ( cardiology)   Kidney insufficiency    Vitamin D deficiency    Headache(784.0)    Allergy      Home Meds: Prior to Admission medications   Medication Sig Start Date End Date Taking? Authorizing Provider  BYSTOLIC 20 MG TABS Take 1 tablet (20 mg total) by mouth daily. 07/13/14   Elvina Sidle, MD  Guaifenesin [facility] MAXIMUM STRENGTH) 1200 MG TB12 Take 1 tablet (1,200 mg total) by mouth every 12 (twelve) hours as needed. 03/19/14   Collie Siad English, PA  losartan (COZAAR) 100 MG tablet Take 1 tablet (100 mg total) by mouth daily. 03/19/14   Collie Siad English, PA  polyethylene glycol powder (GLYCOLAX/MIRALAX) powder Take 17 g by mouth 2 (two) times daily as needed. 07/13/14   Elvina Sidle, MD    Allergies:  Allergies  Allergen  Reactions   Lisinopril     angioedema    History   Social History   Marital Status: Legally Separated    Spouse Name: N/A   Number of Children: N/A   Years of Education: N/A   Occupational History   Not on file.   Social History Main Topics   Smoking status: Never Smoker    Smokeless tobacco: Never Used   Alcohol Use: Yes     Comment: Sometimes   Drug Use: No   Sexual Activity: Yes   Other Topics Concern   Not on file   Social History Narrative     Review of Systems: Constitutional: negative for chills, fever, night sweats, weight changes, or fatigue  HEENT: negative for vision changes, hearing loss, rhinorrhea, ST, or sinus pressure, positive sinus congestion and epistaxis Cardiovascular: negative for chest pain or palpitations Respiratory: negative for hemoptysis, wheezing, shortness of breath, or cough Abdominal: negative for abdominal pain, nausea, vomiting, diarrhea, or constipation, positive blood in stool Dermatological: negative for rash Neurologic: negative for headache, dizziness, or syncope All other systems reviewed and are otherwise negative with the exception to those above and in the HPI.   Physical Exam: There were no vitals taken for this visit., There is no weight on file to calculate BMI. General: Well developed, well nourished, in no acute distress.  Head: Normocephalic, atraumatic, eyes without discharge, sclera non-icteric, nares are without discharge. Bilateral auditory canals clear, TM's are without perforation, pearly grey and translucent with reflective cone of light bilaterally. Oral cavity moist, posterior pharynx without exudate, erythema, peritonsillar abscess, or post nasal drip. nasal passages are swollen  Neck: Supple. No thyromegaly. Full ROM. No lymphadenopathy. Lungs: Clear bilaterally to auscultation without wheezes, rales, or rhonchi. Breathing is unlabored. Heart: RRR with S1 S2. No murmurs, rubs, or gallops appreciated.  Blood pressured measured at 160/90 manually. Abdomen: Soft, non-tender, non-distended with normoactive bowel sounds. No hepatomegaly. No rebound/guarding. No obvious abdominal masses. Msk:  Strength and tone normal for age. Extremities/Skin: Warm and dry. No clubbing or cyanosis. No edema. No rashes or suspicious lesions. Neuro: Alert and oriented X 3. Moves all extremities spontaneously. Gait is normal. CNII-XII grossly in tact. Psych:  Responds to questions appropriately with a normal affect.    ASSESSMENT AND PLAN:  49 y.o. year old male with  This chart was scribed in my presence and reviewed by me personally.    ICD-9-CM ICD-10-CM   1. Hematochezia 578.1 K92.1 Ambulatory referral to Gastroenterology  2. Subacute pansinusitis 461.8 J01.40 amoxicillin (AMOXIL) 875 MG tablet  3. Essential hypertension 401.9 I10    I'll hold off on changing BP meds until after colonoscopy to avoid hypotensive episode.  Signed, [name], MD 07/30/2014 3:55 PM

## 2014-07-30 NOTE — Patient Instructions (Signed)
Colonoscopy A colonoscopy is an exam to look at the entire large intestine (colon). This exam can help find problems such as tumors, polyps, inflammation, and areas of bleeding. The exam takes about 1 hour.  LET [facility] CARE PROVIDER KNOW ABOUT:   Any allergies you have.  All medicines you are taking, including vitamins, herbs, eye drops, creams, and over-the-counter medicines.  Previous problems you or members of your family have had with the use of anesthetics.  Any blood disorders you have.  Previous surgeries you have had.  Medical conditions you have. RISKS AND COMPLICATIONS  Generally, this is a safe procedure. However, as with any procedure, complications can occur. Possible complications include:  Bleeding.  Tearing or rupture of the colon wall.  Reaction to medicines given during the exam.  Infection (rare). BEFORE THE PROCEDURE   Ask your health care provider about changing or stopping your regular medicines.  You may be prescribed an oral bowel prep. This involves drinking a large amount of medicated liquid, starting the day before your procedure. The liquid will cause you to have multiple loose stools until your stool is almost clear or light green. This cleans out your colon in preparation for the procedure.  Do not eat or drink anything else once you have started the bowel prep, unless your health care provider tells you it is safe to do so.  Arrange for someone to drive you home after the procedure. PROCEDURE   You will be given medicine to help you relax (sedative).  You will lie on your side with your knees bent.  A long, flexible tube with a light and camera on the end (colonoscope) will be inserted through the rectum and into the colon. The camera sends video back to a computer screen as it moves through the colon. The colonoscope also releases carbon dioxide gas to inflate the colon. This helps your health care provider see the area better.  During  the exam, your health care provider may take a small tissue sample (biopsy) to be examined under a microscope if any abnormalities are found.  The exam is finished when the entire colon has been viewed. AFTER THE PROCEDURE   Do not drive for 24 hours after the exam.  You may have a small amount of blood in your stool.  You may pass moderate amounts of gas and have mild abdominal cramping or bloating. This is caused by the gas used to inflate your colon during the exam.  Ask when your test results will be ready and how you will get your results. Make sure you get your test results. Document Released: 06/02/2000 Document Revised: 03/26/2013 Document Reviewed: 02/10/2013 [facility] Patient Information 2015 [place], [place]. This information is not intended to replace advice given to you by your health care provider. Make sure you discuss any questions you have with your health care provider. Sinusitis Sinusitis is redness, soreness, and inflammation of the paranasal sinuses. Paranasal sinuses are air pockets within the bones of your face (beneath the eyes, the middle of the forehead, or above the eyes). In healthy paranasal sinuses, mucus is able to drain out, and air is able to circulate through them by way of your nose. However, when your paranasal sinuses are inflamed, mucus and air can become trapped. This can allow bacteria and other germs to grow and cause infection. Sinusitis can develop quickly and last only a short time (acute) or continue over a long period (chronic). Sinusitis that lasts for more than 12  weeks is considered chronic.  CAUSES  Causes of sinusitis include:  Allergies.  Structural abnormalities, such as displacement of the cartilage that separates your nostrils (deviated septum), which can decrease the air flow through your nose and sinuses and affect sinus drainage.  Functional abnormalities, such as when the small hairs (cilia) that line your sinuses and help remove mucus  do not work properly or are not present. SIGNS AND SYMPTOMS  Symptoms of acute and chronic sinusitis are the same. The primary symptoms are pain and pressure around the affected sinuses. Other symptoms include:  Upper toothache.  Earache.  Headache.  Bad breath.  Decreased sense of smell and taste.  A cough, which worsens when you are lying flat.  Fatigue.  Fever.  Thick drainage from your nose, which often is green and may contain pus (purulent).  Swelling and warmth over the affected sinuses. DIAGNOSIS  Your health care provider will perform a physical exam. During the exam, your health care provider may:  Look in your nose for signs of abnormal growths in your nostrils (nasal polyps).  Tap over the affected sinus to check for signs of infection.  View the inside of your sinuses (endoscopy) using an imaging device that has a light attached (endoscope). If your health care provider suspects that you have chronic sinusitis, one or more of the following tests may be recommended:  Allergy tests.  Nasal culture. A sample of mucus is taken from your nose, sent to a lab, and screened for bacteria.  Nasal cytology. A sample of mucus is taken from your nose and examined by your health care provider to determine if your sinusitis is related to an allergy. TREATMENT  Most cases of acute sinusitis are related to a viral infection and will resolve on their own within 10 days. Sometimes medicines are prescribed to help relieve symptoms (pain medicine, decongestants, nasal steroid sprays, or saline sprays).  However, for sinusitis related to a bacterial infection, your health care provider will prescribe antibiotic medicines. These are medicines that will help kill the bacteria causing the infection.  Rarely, sinusitis is caused by a fungal infection. In theses cases, your health care provider will prescribe antifungal medicine. For some cases of chronic sinusitis, surgery is needed.  Generally, these are cases in which sinusitis recurs more than 3 times per year, despite other treatments. HOME CARE INSTRUCTIONS   Drink plenty of water. Water helps thin the mucus so your sinuses can drain more easily.  Use a humidifier.  Inhale steam 3 to 4 times a day (for example, sit in the bathroom with the shower running).  Apply a warm, moist washcloth to your face 3 to 4 times a day, or as directed by your health care provider.  Use saline nasal sprays to help moisten and clean your sinuses.  Take medicines only as directed by your health care provider.  If you were prescribed either an antibiotic or antifungal medicine, finish it all even if you start to feel better. SEEK IMMEDIATE MEDICAL CARE IF:  You have increasing pain or severe headaches.  You have nausea, vomiting, or drowsiness.  You have swelling around your face.  You have vision problems.  You have a stiff neck.  You have difficulty breathing. MAKE SURE YOU:   Understand these instructions.  Will watch your condition.  Will get help right away if you are not doing well or get worse. Document Released: 06/05/2005 Document Revised: 10/20/2013 Document Reviewed: 06/20/2011 [facility] Patient Information 2015 [place], [place].  This information is not intended to replace advice given to you by your health care provider. Make sure you discuss any questions you have with your health care provider. Hypertension Hypertension, commonly called high blood pressure, is when the force of blood pumping through your arteries is too strong. Your arteries are the blood vessels that carry blood from your heart throughout your body. A blood pressure reading consists of a higher number over a lower number, such as 110/72. The higher number (systolic) is the pressure inside your arteries when your heart pumps. The lower number (diastolic) is the pressure inside your arteries when your heart relaxes. Ideally you want your blood  pressure below 120/80. Hypertension forces your heart to work harder to pump blood. Your arteries may become narrow or stiff. Having hypertension puts you at risk for heart disease, stroke, and other problems.  RISK FACTORS Some risk factors for high blood pressure are controllable. Others are not.  Risk factors you cannot control include:   Race. You may be at higher risk if you are African American.  Age. Risk increases with age.  Gender. Men are at higher risk than women before age 81 years. After age 83, women are at higher risk than men. Risk factors you can control include:  Not getting enough exercise or physical activity.  Being overweight.  Getting too much fat, sugar, calories, or salt in your diet.  Drinking too much alcohol. SIGNS AND SYMPTOMS Hypertension does not usually cause signs or symptoms. Extremely high blood pressure (hypertensive crisis) may cause headache, anxiety, shortness of breath, and nosebleed. DIAGNOSIS  To check if you have hypertension, your health care provider will measure your blood pressure while you are seated, with your arm held at the level of your heart. It should be measured at least twice using the same arm. Certain conditions can cause a difference in blood pressure between your right and left arms. A blood pressure reading that is higher than normal on one occasion does not mean that you need treatment. If one blood pressure reading is high, ask your health care provider about having it checked again. TREATMENT  Treating high blood pressure includes making lifestyle changes and possibly taking medicine. Living a healthy lifestyle can help lower high blood pressure. You may need to change some of your habits. Lifestyle changes may include:  Following the DASH diet. This diet is high in fruits, vegetables, and whole grains. It is low in salt, red meat, and added sugars.  Getting at least 2 hours of brisk physical activity every week.  Losing  weight if necessary.  Not smoking.  Limiting alcoholic beverages.  Learning ways to reduce stress. If lifestyle changes are not enough to get your blood pressure under control, your health care provider may prescribe medicine. You may need to take more than one. Work closely with your health care provider to understand the risks and benefits. HOME CARE INSTRUCTIONS  Have your blood pressure rechecked as directed by your health care provider.   Take medicines only as directed by your health care provider. Follow the directions carefully. Blood pressure medicines must be taken as prescribed. The medicine does not work as well when you skip doses. Skipping doses also puts you at risk for problems.   Do not smoke.   Monitor your blood pressure at home as directed by your health care provider. SEEK MEDICAL CARE IF:   You think you are having a reaction to medicines taken.  You have recurrent headaches  or feel dizzy.  You have swelling in your ankles.  You have trouble with your vision. SEEK IMMEDIATE MEDICAL CARE IF:  You develop a severe headache or confusion.  You have unusual weakness, numbness, or feel faint.  You have severe chest or abdominal pain.  You vomit repeatedly.  You have trouble breathing. MAKE SURE YOU:   Understand these instructions.  Will watch your condition.  Will get help right away if you are not doing well or get worse. Document Released: 06/05/2005 Document Revised: 10/20/2013 Document Reviewed: 03/28/2013 [facility] Patient Information 2015 [place], [place]. This information is not intended to replace advice given to you by your health care provider. Make sure you discuss any questions you have with your health care provider.

## 2014-09-19 ENCOUNTER — Ambulatory Visit (INDEPENDENT_AMBULATORY_CARE_PROVIDER_SITE_OTHER): Payer: 59 | Admitting: Family Medicine

## 2014-09-19 VITALS — BP 146/88 | HR 51 | Temp 98.0°F | Resp 18 | Ht 72.0 in | Wt 253.8 lb

## 2014-09-19 DIAGNOSIS — R51 Headache: Secondary | ICD-10-CM

## 2014-09-19 DIAGNOSIS — R519 Headache, unspecified: Secondary | ICD-10-CM

## 2014-09-19 MED ORDER — AMOXICILLIN 875 MG PO TABS: 875.0000 mg | ORAL_TABLET | Freq: Two times a day (BID) | ORAL | Status: DC

## 2014-09-19 MED ORDER — BUTALBITAL-APAP-CAFFEINE 50-325-40 MG PO TABS: 1.0000 | ORAL_TABLET | Freq: Four times a day (QID) | ORAL | Status: DC | PRN

## 2014-09-19 NOTE — Patient Instructions (Signed)
I believe that the headache you're experiencing results from the recent dental work and a mild infection. It's an unusual history to develop migraines at your age and gender. You do have some redness on the left side of your mouth which would go along with the dental infection.  Please let he know if this is not responding to her medicines

## 2014-09-19 NOTE — Progress Notes (Signed)
Patient ID: Raymond Green, male   DOB: 1965-11-08, 49 y.o.   MRN: [id number]   This chart was scribed for Elvina Sidle, MD by Haywood Pao, ED Scribe at [facility].The patient was seen in exam room 08 and the patient's care was started at 9:36 AM.  Patient ID: Raymond Green MRN: [id number], DOB: 1966-04-26, 49 y.o. Date of Encounter: 09/19/2014, 9:36 AM  Primary Physician: Tally Due, MD  Chief Complaint:  Chief Complaint  Patient presents with  . Headache    Not sure if its a sinus infection or migraine x2 weeks   HPI:  Raymond Green is a 49 y.o. male who presents to [facility] complaining of a new headache ongoing  for 2 weeks. Pt states he has had a headache everyday for the past two weeks. The headache is localized primarily  to the left side. He has trouble sleeping due to the pain, and uses an ice pack for relief. He has recently had dental work and he is unsure if he has an infection. He works in endoscopy. He denies fever.  Past Medical History  Diagnosis Date  . Hypertension   . OSA (obstructive sleep apnea)   . Anemia   . Positive PPD, treated 2000    INH  . Obesity   . Hemorrhoids   . LVH (left ventricular hypertrophy)     Dr. Donnie Aho ( cardiology)  . Kidney insufficiency   . Vitamin D deficiency   . Headache(784.0)   . Allergy     Home Meds: Prior to Admission medications   Medication Sig Start Date End Date Taking? Authorizing Provider  BYSTOLIC 20 MG TABS Take 1 tablet (20 mg total) by mouth daily. 07/13/14  Yes Elvina Sidle, MD  losartan (COZAAR) 100 MG tablet Take 1 tablet (100 mg total) by mouth daily. 03/19/14  Yes Stephanie D English, PA  polyethylene glycol powder (GLYCOLAX/MIRALAX) powder Take 17 g by mouth 2 (two) times daily as needed. 07/13/14  Yes Elvina Sidle, MD  amoxicillin (AMOXIL) 875 MG tablet Take 1 tablet (875 mg total) by mouth 2 (two) times daily. Patient not taking: Reported  on 09/19/2014 07/30/14   Elvina Sidle, MD  Guaifenesin [facility] MAXIMUM STRENGTH) 1200 MG TB12 Take 1 tablet (1,200 mg total) by mouth every 12 (twelve) hours as needed. Patient not taking: Reported on 09/19/2014 03/19/14   Collie Siad English, PA   Allergies:  Allergies  Allergen Reactions  . Lisinopril     angioedema   History   Social History  . Marital Status: Legally Separated    Spouse Name: N/A  . Number of Children: N/A  . Years of Education: N/A   Occupational History  . Not on file.   Social History Main Topics  . Smoking status: Never Smoker   . Smokeless tobacco: Never Used  . Alcohol Use: Yes     Comment: Sometimes  . Drug Use: No  . Sexual Activity: Yes   Other Topics Concern  . Not on file   Social History Narrative    Review of Systems: Constitutional: negative for chills, fever, night sweats, weight changes, or fatigue  HEENT: negative for vision changes, hearing loss, congestion, rhinorrhea, ST, epistaxis, or sinus pressure Cardiovascular: negative for chest pain or palpitations Respiratory: negative for hemoptysis, wheezing, shortness of breath, or cough Abdominal: negative for abdominal pain, nausea, vomiting, diarrhea, or constipation Dermatological: negative for rash Neurologic: negative for dizziness, or syncope.  Positive for headache. All other systems reviewed and are otherwise negative with the exception to those above and in the HPI.  Physical Exam: Blood pressure 146/88, pulse 51, temperature 98 F (36.7 C), temperature source Oral, resp. rate 18, height 6' (1.829 m), weight 253 lb 12.8 oz (115.123 kg), SpO2 100 %., Body mass index is 34.41 kg/(m^2). General: Well developed, well nourished, in no acute distress. Head: Normocephalic, atraumatic, eyes without discharge, sclera non-icteric, nares are without discharge. Bilateral auditory canals clear, TM's are without perforation, pearly grey and translucent with reflective cone of light  bilaterally. Oral cavity moist, posterior pharynx without exudate, erythema, peritonsillar abscess, or post nasal drip.  Neck: Supple. No thyromegaly. Full ROM. No lymphadenopathy. Lungs: Clear bilaterally to auscultation without wheezes, rales, or rhonchi. Breathing is unlabored. Heart: RRR with S1 S2. No murmurs, rubs, or gallops appreciated. Abdomen: Soft, non-tender, non-distended with normoactive bowel sounds. No hepatomegaly. No rebound/guarding. No obvious abdominal masses. Msk:  Strength and tone normal for age. Extremities/Skin: Warm and dry. No clubbing or cyanosis. No edema. No rashes or suspicious lesions. Neuro: Alert and oriented X 3. Moves all extremities spontaneously. Gait is normal. CNII-XII grossly in tact. Psych:  Responds to questions appropriately with a normal affect.    ASSESSMENT AND PLAN:  49 y.o. year old male with headache possibly related to recent dental work  This chart was scribed in my presence and reviewed by me personally.    ICD-9-CM ICD-10-CM   1. Headache, unspecified headache type 784.0 R51 butalbital-acetaminophen-caffeine (FIORICET) 50-325-40 MG per tablet     amoxicillin (AMOXIL) 875 MG tablet     Signed, Elvina SidleKurt Marylou Wages, MD

## 2014-10-06 ENCOUNTER — Other Ambulatory Visit: Payer: Self-pay | Admitting: Gastroenterology

## 2014-10-06 DIAGNOSIS — R1032 Left lower quadrant pain: Secondary | ICD-10-CM

## 2014-10-10 ENCOUNTER — Ambulatory Visit (INDEPENDENT_AMBULATORY_CARE_PROVIDER_SITE_OTHER): Payer: 59 | Admitting: Family Medicine

## 2014-10-10 VITALS — BP 158/102 | HR 73 | Temp 97.5°F | Resp 16 | Ht 71.25 in | Wt 252.6 lb

## 2014-10-10 DIAGNOSIS — M10072 Idiopathic gout, left ankle and foot: Secondary | ICD-10-CM

## 2014-10-10 MED ORDER — INDOMETHACIN 50 MG PO CAPS: 50.0000 mg | ORAL_CAPSULE | Freq: Two times a day (BID) | ORAL | Status: DC

## 2014-10-10 MED ORDER — PREDNISONE 20 MG PO TABS: ORAL_TABLET | ORAL | Status: DC

## 2014-10-10 NOTE — Progress Notes (Signed)
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 He comes in with gout in his left great toe. This began last night. He's had a gout attack in the past, the most recent being 8 months ago.  Patient's past tooth infection has healed but he still hasn't seen a dentist.  Objective: Blood great toe is tender and swollen No acute distress Rest the foot appears normal Oropharynx: No swelling Skin: Mild erythema of the left great toe      ICD-9-CM ICD-10-CM   1. Acute idiopathic gout of left foot 274.01 M10.072 predniSONE (DELTASONE) 20 MG tablet     indomethacin (INDOCIN) 50 MG capsule     Signed, Elvina SidleKurt Amena Dockham, MD

## 2014-10-10 NOTE — Patient Instructions (Signed)

## 2014-10-19 ENCOUNTER — Ambulatory Visit
Admission: RE | Admit: 2014-10-19 | Discharge: 2014-10-19 | Disposition: A | Discharge status: Home / Self Care | Payer: 59 | Source: Ambulatory Visit | Attending: Gastroenterology | Admitting: Gastroenterology

## 2014-10-19 DIAGNOSIS — R1032 Left lower quadrant pain: Secondary | ICD-10-CM

## 2014-10-19 MED ORDER — IOPAMIDOL (ISOVUE-300) INJECTION 61%
100.0000 mL | Freq: Once | INTRAVENOUS | Status: AC | PRN
  Administered 2014-10-19: 100 mL via INTRAVENOUS

## 2014-10-19 MED ORDER — IOPAMIDOL (ISOVUE-300) INJECTION 61%
25.0000 mL | Freq: Once | INTRAVENOUS | Status: AC | PRN
  Administered 2014-10-19: 25 mL via INTRAVENOUS

## 2015-01-02 ENCOUNTER — Ambulatory Visit (INDEPENDENT_AMBULATORY_CARE_PROVIDER_SITE_OTHER): Payer: 59 | Admitting: Family Medicine

## 2015-01-02 VITALS — BP 172/102 | HR 72 | Temp 98.3°F | Resp 17 | Ht 71.5 in | Wt 247.0 lb

## 2015-01-02 DIAGNOSIS — R1012 Left upper quadrant pain: Secondary | ICD-10-CM

## 2015-01-02 DIAGNOSIS — I10 Essential (primary) hypertension: Secondary | ICD-10-CM | POA: Diagnosis not present

## 2015-01-02 DIAGNOSIS — D508 Other iron deficiency anemias: Secondary | ICD-10-CM | POA: Diagnosis not present

## 2015-01-02 DIAGNOSIS — K429 Umbilical hernia without obstruction or gangrene: Secondary | ICD-10-CM

## 2015-01-02 DIAGNOSIS — M79644 Pain in right finger(s): Secondary | ICD-10-CM

## 2015-01-02 DIAGNOSIS — K5901 Slow transit constipation: Secondary | ICD-10-CM | POA: Diagnosis not present

## 2015-01-02 LAB — POCT CBC
Granulocyte percent: 53.4 %G (ref 37–80)
HCT, POC: 40.9 % — AB (ref 43.5–53.7)
Hemoglobin: 13.7 g/dL — AB (ref 14.1–18.1)
Lymph, poc: 2 (ref 0.6–3.4)
MCH, POC: 28.8 pg (ref 27–31.2)
MCHC: 33.5 g/dL (ref 31.8–35.4)
MCV: 85.9 fL (ref 80–97)
MID (cbc): 0.3 (ref 0–0.9)
MPV: 8.5 fL (ref 0–99.8)
POC Granulocyte: 2.6 (ref 2–6.9)
POC LYMPH PERCENT: 41.4 %L (ref 10–50)
POC MID %: 5.2 %M (ref 0–12)
Platelet Count, POC: 172 10*3/uL (ref 142–424)
RBC: 4.76 M/uL (ref 4.69–6.13)
RDW, POC: 13.3 %
WBC: 4.9 10*3/uL (ref 4.6–10.2)

## 2015-01-02 LAB — COMPLETE METABOLIC PANEL WITH GFR
ALT: 12 U/L (ref 0–53)
AST: 20 U/L (ref 0–37)
Albumin: 4.2 g/dL (ref 3.5–5.2)
Alkaline Phosphatase: 49 U/L (ref 39–117)
BUN: 13 mg/dL (ref 6–23)
CO2: 28 mEq/L (ref 19–32)
Calcium: 10 mg/dL (ref 8.4–10.5)
Chloride: 104 mEq/L (ref 96–112)
Creat: 1.49 mg/dL — ABNORMAL HIGH (ref 0.50–1.35)
GFR, Est African American: 63 mL/min
GFR, Est Non African American: 55 mL/min — ABNORMAL LOW
Glucose, Bld: 81 mg/dL (ref 70–99)
Potassium: 4.7 mEq/L (ref 3.5–5.3)
Sodium: 140 mEq/L (ref 135–145)
Total Bilirubin: 0.7 mg/dL (ref 0.2–1.2)
Total Protein: 7.4 g/dL (ref 6.0–8.3)

## 2015-01-02 LAB — POCT URINALYSIS DIPSTICK
Bilirubin, UA: NEGATIVE
Blood, UA: NEGATIVE
Glucose, UA: NEGATIVE
Ketones, UA: NEGATIVE
Leukocytes, UA: NEGATIVE
Nitrite, UA: NEGATIVE
Protein, UA: NEGATIVE
Spec Grav, UA: 1.015
Urobilinogen, UA: 0.2
pH, UA: 7

## 2015-01-02 LAB — POCT UA - MICROSCOPIC ONLY
Casts, Ur, LPF, POC: NEGATIVE
Crystals, Ur, HPF, POC: NEGATIVE
Mucus, UA: NEGATIVE
Yeast, UA: NEGATIVE

## 2015-01-02 LAB — AMYLASE: Amylase: 81 U/L (ref 0–105)

## 2015-01-02 MED ORDER — BYSTOLIC 20 MG PO TABS: 1.0000 | ORAL_TABLET | Freq: Two times a day (BID) | ORAL | Status: DC

## 2015-01-02 MED ORDER — MELOXICAM 7.5 MG PO TABS: 7.5000 mg | ORAL_TABLET | Freq: Every day | ORAL | Status: DC

## 2015-01-02 MED ORDER — RANITIDINE HCL 150 MG PO TABS: 150.0000 mg | ORAL_TABLET | Freq: Two times a day (BID) | ORAL | Status: DC

## 2015-01-02 NOTE — Progress Notes (Signed)
This chart was scribed for Dr. Elvina SidleKurt Pami Wool, MD by Jarvis Morganaylor Ferguson, Medical Scribe. This patient was seen in Room 5 and the patient's care was started at 12:43 PM.   Patient ID: Raymond Green MRN: [id number], DOB: 1966/03/04, 49 y.o. Date of Encounter: 01/02/2015, 1:38 PM  Primary Physician: Tally DueGUEST, CHRIS WARREN, MD  Chief Complaint:  Chief Complaint  Patient presents with  . Follow-up    review test results egd, colonscopy     HPI: 49 y.o. year old male with history below presents to discuss results from his endoscopy and colonscopy. Pt was shown to have gastric ulcer but otherwise normal colonscopy. Pt also had a CT scan done in May. He states he is still having LUQ abdominal pain with no relief. Pt suffers from intermittent constipation and takes Dulcolax with relief.   Pt has had recent stress in the home. He states he is having problems with his wife for about 1 week.   He denies any recent gout flare ups except for possible gout flare up in right ring finger.  Past Medical History  Diagnosis Date  . Hypertension   . OSA (obstructive sleep apnea)   . Anemia   . Positive PPD, treated 2000    INH  . Obesity   . Hemorrhoids   . LVH (left ventricular hypertrophy)     Dr. Donnie Ahoilley ( cardiology)  . Kidney insufficiency   . Vitamin D deficiency   . Headache(784.0)   . Allergy      Home Meds: Prior to Admission medications   Medication Sig Start Date End Date Taking? Authorizing Provider  butalbital-acetaminophen-caffeine (FIORICET) 50-325-40 MG per tablet Take 1-2 tablets by mouth every 6 (six) hours as needed for headache. 09/19/14 09/19/15 Yes Elvina SidleKurt Ashton Sabine, MD  BYSTOLIC 20 MG TABS Take 1 tablet (20 mg total) by mouth daily. 07/13/14  Yes Elvina SidleKurt Tereasa Yilmaz, MD  losartan (COZAAR) 100 MG tablet Take 1 tablet (100 mg total) by mouth daily. 03/19/14  Yes Collie SiadStephanie D English, PA  omeprazole (PRILOSEC) 10 MG capsule Take 10 mg by mouth daily.   Yes Historical Provider, MD    polyethylene glycol powder (GLYCOLAX/MIRALAX) powder Take 17 g by mouth 2 (two) times daily as needed. 07/13/14  Yes Elvina SidleKurt Adanya Sosinski, MD  indomethacin (INDOCIN) 50 MG capsule Take 1 capsule (50 mg total) by mouth 2 (two) times daily with a meal. Patient not taking: Reported on 01/02/2015 10/10/14   Elvina SidleKurt Bubber Rothert, MD    Allergies:  Allergies  Allergen Reactions  . Lisinopril     angioedema    History   Social History  . Marital Status: Married    Spouse Name: N/A  . Number of Children: N/A  . Years of Education: N/A   Occupational History  . Not on file.   Social History Main Topics  . Smoking status: Never Smoker   . Smokeless tobacco: Never Used  . Alcohol Use: Yes     Comment: Sometimes  . Drug Use: No  . Sexual Activity: Yes   Other Topics Concern  . Not on file   Social History Narrative     Review of Systems: Constitutional: negative for chills, fever, night sweats, weight changes, or fatigue  HEENT: negative for vision changes, hearing loss, congestion, rhinorrhea, ST, epistaxis, or sinus pressure Cardiovascular: negative for chest pain or palpitations Respiratory: negative for hemoptysis, wheezing, shortness of breath, or cough Abdominal: positive for abdominal pain and consipation. Negative for nausea, vomiting, or diarrhea. Dermatological: negative for  rash Neurologic: negative for headache, dizziness, or syncope Musk: positive for arthralgias and swelling to right ring finger All other systems reviewed and are otherwise negative with the exception to those above and in the HPI.   Physical Exam: Blood pressure 172/102, pulse 72, temperature 98.3 F (36.8 C), temperature source Oral, resp. rate 17, height 5' 11.5" (1.816 m), weight 247 lb (112.038 kg), SpO2 97 %., Body mass index is 33.97 kg/(m^2). General: Well developed, well nourished, in no acute distress. Head: Normocephalic, atraumatic, eyes without discharge, sclera non-icteric, nares are without  discharge. Bilateral auditory canals clear, TM's are without perforation, pearly grey and translucent with reflective cone of light bilaterally. Oral cavity moist, posterior pharynx without exudate, erythema, peritonsillar abscess, or post nasal drip.  Neck: Supple. No thyromegaly. Full ROM. No lymphadenopathy. Lungs: Clear bilaterally to auscultation without wheezes, rales, or rhonchi. Breathing is unlabored. Heart: RRR with S1 S2. No murmurs, rubs, or gallops appreciated. Abdomen: Soft, RUQ tenderness, non-distended with normoactive bowel sounds. No hepatomegaly. No rebound/guarding. No obvious abdominal masses. Msk:  Strength and tone normal for age. Extremities/Skin: Warm and dry. No clubbing or cyanosis. No edema. No rashes or suspicious lesions. Mild swelling of the right DIP joint ring finger Neuro: Alert and oriented X 3. Moves all extremities spontaneously. Gait is normal. CNII-XII grossly in tact. Psych:  Responds to questions appropriately with a normal affect. BP Recheck: 160/102   Labs: Results for orders placed or performed in visit on 01/02/15  POCT urinalysis dipstick  Result Value Ref Range   Color, UA yellow    Clarity, UA clear    Glucose, UA negative    Bilirubin, UA negative    Ketones, UA negative    Spec Grav, UA 1.015    Blood, UA negative    pH, UA 7.0    Protein, UA negative    Urobilinogen, UA 0.2    Nitrite, UA negative    Leukocytes, UA Negative Negative  POCT UA - Microscopic Only  Result Value Ref Range   WBC, Ur, HPF, POC 0-1    RBC, urine, microscopic 0-1    Bacteria, U Microscopic 1+    Mucus, UA negative    Epithelial cells, urine per micros 0-1    Crystals, Ur, HPF, POC negative    Casts, Ur, LPF, POC negative    Yeast, UA negative   POCT CBC  Result Value Ref Range   WBC 4.9 4.6 - 10.2 K/uL   Lymph, poc 2.0 0.6 - 3.4   POC LYMPH PERCENT 41.4 10 - 50 %L   MID (cbc) 0.3 0 - 0.9   POC MID % 5.2 0 - 12 %M   POC Granulocyte 2.6 2 - 6.9    Granulocyte percent 53.4 37 - 80 %G   RBC 4.76 4.69 - 6.13 M/uL   Hemoglobin 13.7 (A) 14.1 - 18.1 g/dL   HCT, POC 11.9 (A) 14.7 - 53.7 %   MCV 85.9 80 - 97 fL   MCH, POC 28.8 27 - 31.2 pg   MCHC 33.5 31.8 - 35.4 g/dL   RDW, POC 82.9 %   Platelet Count, POC 172 142 - 424 K/uL   MPV 8.5 0 - 99.8 fL      ASSESSMENT AND PLAN:  49 y.o. year old male with persistent left upper quadrant pain   This chart was scribed in my presence and reviewed by me personally.    ICD-9-CM ICD-10-CM   1. LUQ abdominal pain 789.02 R10.12 POCT  urinalysis dipstick     POCT UA - Microscopic Only     COMPLETE METABOLIC PANEL WITH GFR     POCT CBC     Amylase     meloxicam (MOBIC) 7.5 MG tablet     ranitidine (ZANTAC) 150 MG tablet  2. Other iron deficiency anemias 280.8 D50.8   3. Finger pain, right 729.5 M79.644   4. Slow transit constipation 564.01 K59.01   5. Essential hypertension 401.9 I10 BYSTOLIC 20 MG TABS  6. Periumbilical hernia 553.1 K42.9      Signed, Elvina Sidle, MD  Signed, Elvina Sidle, MD 01/02/2015 1:38 PM

## 2015-01-02 NOTE — Patient Instructions (Addendum)
I want you to stop the Nexium. I am switching her medications so that you take Zantac 150 mg twice a day and meloxicam for inflammation. I want you to come back in one week to recheck your pain.  Your blood pressure is elevated so I will you to take the by systolic twice a day now.

## 2015-01-10 ENCOUNTER — Ambulatory Visit (INDEPENDENT_AMBULATORY_CARE_PROVIDER_SITE_OTHER): Payer: 59 | Admitting: Family Medicine

## 2015-01-10 VITALS — BP 140/94 | HR 56 | Temp 98.2°F | Resp 16 | Ht 71.5 in | Wt 251.2 lb

## 2015-01-10 DIAGNOSIS — R1032 Left lower quadrant pain: Secondary | ICD-10-CM

## 2015-01-10 MED ORDER — AMOXICILLIN-POT CLAVULANATE 875-125 MG PO TABS: 1.0000 | ORAL_TABLET | Freq: Two times a day (BID) | ORAL | Status: DC

## 2015-01-10 MED ORDER — HYOSCYAMINE SULFATE 0.125 MG SL SUBL: 0.1250 mg | SUBLINGUAL_TABLET | Freq: Every evening | SUBLINGUAL | Status: DC | PRN

## 2015-01-10 MED ORDER — AMOXICILLIN-POT CLAVULANATE 875-125 MG PO TABS
1.0000 | ORAL_TABLET | Freq: Two times a day (BID) | ORAL | Status: DC
Start: 2015-01-10 — End: 2015-01-10

## 2015-01-10 NOTE — Patient Instructions (Signed)
I want you to let me know by Tuesday if the pain continues to wake you up at night. Take the levsin at night and the augmentin twice a day. If the pain persists, we'll go to an MRI for imaging.

## 2015-01-10 NOTE — Progress Notes (Addendum)
This chart was scribed for Raymond Sidle, MD by [name], medical scribe at [facility].The patient was seen in exam room 2 and the patient's care was started at 1:39 PM.  Patient ID: Raymond Green MRN: [id number], DOB: [birth year], 49 y.o. Date of Encounter: 01/10/2015  Primary Physician: Tally Due, MD  Chief Complaint:  Chief Complaint  Patient presents with   Follow-up    discuss how he is doing on medication    HPI:  Raymond Green is a 49 y.o. male who presents to [facility] for follow-up about his medication.  Pt states that he feels pain on his LUQ after an hour laying down. It's becoming difficult for him to go to sleep at night. He denies any pain when he sits down.  He has been taking the mobic and was wondering if it takes time to kick in. He was taking antibiotics a few months ago for a headache.  His GI doctor, Dr. Dorena Cookey, says to come in to see me first before going back to him.   His right ring finger has been feeling better.   He denies constipation and his BM is regular.   He mentions that things are better with his wife.    Past Medical History  Diagnosis Date   Hypertension    OSA (obstructive sleep apnea)    Anemia    Positive PPD, treated 2000    INH   Obesity    Hemorrhoids    LVH (left ventricular hypertrophy)     Dr. Donnie Aho ( cardiology)   Kidney insufficiency    Vitamin D deficiency    Headache(784.0)    Allergy      Home Meds: Prior to Admission medications   Medication Sig Start Date End Date Taking? Authorizing Provider  butalbital-acetaminophen-caffeine (FIORICET) 50-325-40 MG per tablet Take 1-2 tablets by mouth every 6 (six) hours as needed for headache. 09/19/14 09/19/15  Raymond Sidle, MD  BYSTOLIC 20 MG TABS Take 1 tablet (20 mg total) by mouth 2 (two) times daily. 01/02/15   Raymond Sidle, MD  indomethacin (INDOCIN) 50 MG capsule Take 1 capsule (50 mg  total) by mouth 2 (two) times daily with a meal. Patient not taking: Reported on 01/02/2015 10/10/14   Raymond Sidle, MD  losartan (COZAAR) 100 MG tablet Take 1 tablet (100 mg total) by mouth daily. 03/19/14   Collie Siad English, PA  meloxicam (MOBIC) 7.5 MG tablet Take 1 tablet (7.5 mg total) by mouth daily. 01/02/15   Raymond Sidle, MD  polyethylene glycol powder (GLYCOLAX/MIRALAX) powder Take 17 g by mouth 2 (two) times daily as needed. 07/13/14   Raymond Sidle, MD  ranitidine (ZANTAC) 150 MG tablet Take 1 tablet (150 mg total) by mouth 2 (two) times daily. 01/02/15   Raymond Sidle, MD    Allergies:  Allergies  Allergen Reactions   Lisinopril     angioedema    History   Social History   Marital Status: Married    Spouse Name: N/A   Number of Children: N/A   Years of Education: N/A   Occupational History   Not on file.   Social History Main Topics   Smoking status: Never Smoker    Smokeless tobacco: Never Used   Alcohol Use: Yes     Comment: Sometimes   Drug Use: No   Sexual Activity: Yes   Other Topics Concern   Not on file   Social  History Narrative     Review of Systems: Constitutional: negative for chills, fever, night sweats, weight changes, or fatigue  HEENT: negative for vision changes, hearing loss, congestion, rhinorrhea, ST, epistaxis, or sinus pressure Cardiovascular: negative for chest pain or palpitations Respiratory: negative for hemoptysis, wheezing, shortness of breath, or cough Abdominal: negative for nausea, vomiting, diarrhea, or constipation; positive for abdominal pain (LUQ) Dermatological: negative for rash Neurologic: negative for headache, dizziness, or syncope All other systems reviewed and are otherwise negative with the exception to those above and in the HPI.  Physical Exam: Blood pressure 140/94, pulse 56, temperature 98.2 F (36.8 C), temperature source Oral, resp. rate 16, height 5' 11.5" (1.816 m), weight 251 lb 3.2  oz (113.944 kg), SpO2 98 %., Body mass index is 34.55 kg/(m^2). General: Well developed, well nourished, in no acute distress. Head: Normocephalic, atraumatic, eyes without discharge, sclera non-icteric, nares are without discharge. Bilateral auditory canals clear, TM's are without perforation, pearly grey and translucent with reflective cone of light bilaterally. Oral cavity moist, posterior pharynx without exudate, erythema, peritonsillar abscess, or post nasal drip.  Neck: Supple. No thyromegaly. Full ROM. No lymphadenopathy. Lungs: Clear bilaterally to auscultation without wheezes, rales, or rhonchi. Breathing is unlabored. Heart: RRR with S1 S2. No murmurs, rubs, or gallops appreciated. Abdomen: Soft, minimally tender LLQ, non-distended with normoactive bowel sounds. No hepatomegaly. No rebound/guarding. No obvious abdominal masses. Msk:  Strength and tone normal for age. Extremities/Skin: Warm and dry. No clubbing or cyanosis. No edema. No rashes or suspicious lesions. Neuro: Alert and oriented X 3. Moves all extremities spontaneously. Gait is normal. CNII-XII grossly in tact. Psych:  Responds to questions appropriately with a normal affect.    ASSESSMENT AND PLAN:  49 y.o. year old male with  This chart was scribed in my presence and reviewed by me personally.    ICD-9-CM ICD-10-CM   1. Abdominal pain, left lower quadrant 789.04 R10.32 amoxicillin-clavulanate (AUGMENTIN) 875-125 MG per tablet     hyoscyamine (LEVSIN/SL) 0.125 MG SL tablet     DISCONTINUED: hyoscyamine (LEVSIN/SL) 0.125 MG SL tablet     DISCONTINUED: amoxicillin-clavulanate (AUGMENTIN) 875-125 MG per tablet    I want you to let me know by Tuesday if the pain continues to wake you up at night. Take the levsin at night and the augmentin twice a day. If the pain persists, we'll go to an MRI for imaging.  Signed, Raymond Sidle, MD 01/10/2015 1:39 PM

## 2015-01-12 ENCOUNTER — Telehealth: Payer: Self-pay

## 2015-01-12 DIAGNOSIS — R1012 Left upper quadrant pain: Secondary | ICD-10-CM

## 2015-01-12 NOTE — Telephone Encounter (Signed)
Order for MRI placed

## 2015-01-12 NOTE — Telephone Encounter (Signed)
Patient is requesting MRI for his pain.   [phone]

## 2015-01-12 NOTE — Telephone Encounter (Signed)
Can someone help for Dr. Milus Glazier?

## 2015-01-13 NOTE — Telephone Encounter (Signed)
Called pt to let him know. No vm to leave message.

## 2015-01-19 ENCOUNTER — Ambulatory Visit
Admission: RE | Admit: 2015-01-19 | Discharge: 2015-01-19 | Disposition: A | Discharge status: Home / Self Care | Payer: 59 | Source: Ambulatory Visit | Attending: Physician Assistant | Admitting: Physician Assistant

## 2015-01-19 DIAGNOSIS — R1012 Left upper quadrant pain: Secondary | ICD-10-CM

## 2015-01-19 MED ORDER — GADOBENATE DIMEGLUMINE 529 MG/ML IV SOLN
20.0000 mL | Freq: Once | INTRAVENOUS | Status: AC | PRN
  Administered 2015-01-19: 20 mL via INTRAVENOUS

## 2015-01-20 ENCOUNTER — Encounter: Payer: Self-pay | Admitting: Internal Medicine

## 2015-02-19 ENCOUNTER — Telehealth: Payer: Self-pay

## 2015-02-19 DIAGNOSIS — I10 Essential (primary) hypertension: Secondary | ICD-10-CM

## 2015-02-19 NOTE — Telephone Encounter (Signed)
Spoke with pt, advised him to RTC. Pt agreed.

## 2015-02-19 NOTE — Telephone Encounter (Signed)
Patient is calling to request a refill for his blood pressure medication sent to [facility]. He did say the medication name

## 2015-02-19 NOTE — Telephone Encounter (Signed)
He needs to come in for office visit. I cannot refill this medication because of previous angioedema listed for lisinopril. We need further clarification on this and in general follow up every 6 months for his blood pressure.

## 2015-02-19 NOTE — Telephone Encounter (Signed)
Pt is calling for refill on Cozaar, he states he takes this everyday but it has not been Rx's since 03/2014. Please advise.

## 2015-02-19 NOTE — Telephone Encounter (Signed)
losartan (COZAAR) 100 MG tablet [[id number]]      Order Details    Dose: 100 mg Route: Oral Frequency: Daily   Dispense Quantity:  30 tablet Refills:  0 Fills Remaining:  0          Sig: Take 1 tablet (100 mg total) by mouth daily.         Written Date:  03/19/14 Expiration Date:  03/19/15     Start Date:  03/19/14 End Date:  --     Ordering Provider:  -- Authorizing Provider:  Garnetta Buddy, PA Ordering User:  Garnetta Buddy, PA

## 2015-02-22 ENCOUNTER — Ambulatory Visit (INDEPENDENT_AMBULATORY_CARE_PROVIDER_SITE_OTHER): Payer: 59 | Admitting: Family Medicine

## 2015-02-22 VITALS — BP 140/92 | HR 43 | Temp 98.1°F | Resp 20 | Ht 71.75 in | Wt 245.2 lb

## 2015-02-22 DIAGNOSIS — G8929 Other chronic pain: Secondary | ICD-10-CM | POA: Diagnosis not present

## 2015-02-22 DIAGNOSIS — Z7189 Other specified counseling: Secondary | ICD-10-CM

## 2015-02-22 DIAGNOSIS — R1032 Left lower quadrant pain: Secondary | ICD-10-CM

## 2015-02-22 DIAGNOSIS — Z7185 Encounter for immunization safety counseling: Secondary | ICD-10-CM

## 2015-02-22 DIAGNOSIS — Z23 Encounter for immunization: Secondary | ICD-10-CM | POA: Diagnosis not present

## 2015-02-22 DIAGNOSIS — R059 Cough, unspecified: Secondary | ICD-10-CM

## 2015-02-22 DIAGNOSIS — R05 Cough: Secondary | ICD-10-CM

## 2015-02-22 DIAGNOSIS — R1012 Left upper quadrant pain: Secondary | ICD-10-CM

## 2015-02-22 DIAGNOSIS — R109 Unspecified abdominal pain: Secondary | ICD-10-CM

## 2015-02-22 LAB — COMPLETE METABOLIC PANEL WITH GFR
ALT: 13 U/L (ref 9–46)
AST: 17 U/L (ref 10–40)
Albumin: 4.3 g/dL (ref 3.6–5.1)
Alkaline Phosphatase: 41 U/L (ref 40–115)
BUN: 17 mg/dL (ref 7–25)
CO2: 30 mmol/L (ref 20–31)
Calcium: 10 mg/dL (ref 8.6–10.3)
Chloride: 102 mmol/L (ref 98–110)
Creat: 1.43 mg/dL — ABNORMAL HIGH (ref 0.60–1.35)
GFR, Est African American: 66 mL/min (ref >=60)
GFR, Est Non African American: 57 mL/min — ABNORMAL LOW (ref >=60)
Glucose, Bld: 87 mg/dL (ref 65–99)
Potassium: 4.6 mmol/L (ref 3.5–5.3)
Sodium: 141 mmol/L (ref 135–146)
Total Bilirubin: 0.9 mg/dL (ref 0.2–1.2)
Total Protein: 7.5 g/dL (ref 6.1–8.1)

## 2015-02-22 LAB — POCT URINALYSIS DIPSTICK
Bilirubin, UA: NEGATIVE
Blood, UA: NEGATIVE
Glucose, UA: NEGATIVE
Ketones, UA: NEGATIVE
Leukocytes, UA: NEGATIVE
Nitrite, UA: NEGATIVE
Protein, UA: NEGATIVE
Spec Grav, UA: 1.015
Urobilinogen, UA: 0.2
pH, UA: 7

## 2015-02-22 LAB — POCT UA - MICROSCOPIC ONLY
Bacteria, U Microscopic: NEGATIVE
Casts, Ur, LPF, POC: NEGATIVE
Crystals, Ur, HPF, POC: NEGATIVE
Epithelial cells, urine per micros: NEGATIVE
Mucus, UA: NEGATIVE
RBC, urine, microscopic: NEGATIVE
WBC, Ur, HPF, POC: NEGATIVE
Yeast, UA: NEGATIVE

## 2015-02-22 MED ORDER — AZITHROMYCIN 250 MG PO TABS: ORAL_TABLET | ORAL | Status: DC

## 2015-02-22 NOTE — Patient Instructions (Signed)
Try to limit the Mobic and other anti-inflammatory medicines in the hopes that he'll have less negative effect on the kidneys.

## 2015-02-22 NOTE — Progress Notes (Addendum)
Patient ID: Raymond Green, male   DOB: 02-20-1966, 49 y.o.   MRN: [id number]  This chart was scribed for Elvina Sidle, MD by Charline Bills, ED Scribe. The patient was seen in room 14. Patient's care was started at 11:53 AM.  Patient ID: Raymond Green MRN: [id number], DOB: [birth year], 49 y.o. Date of Encounter: 02/22/2015, 12:13 PM  Primary Physician: Tally Due, MD  Chief Complaint  Patient presents with   Follow-up    follow up with Dr. Elbert Ewings.  flu shot today   Medication Refill    will need refills of cozaar   Cough    cough x 3 weeks---dry cough--at times with yellow mucus    HPI: 49 y.o. year old male with history below presents for a medication refill for cozaar. Pt states that he feels well overall.   Flank Pain Pt reports intermittent left flank pain at night for the past 2 months. Pain is exacerbated with lying on his back. He denies back pain at this time. He reports associated urinary frequency at night as well. He requests a referral to a specialist at this time.   Cough Pt reports intermittent productive cough with yellow mucous for the past 3 weeks. He reports that he had cold-like symptoms a few weeks ago but the cough is still lingering. He denies chest pain or SOB.   Immunizations Pt requests a flu vaccine during this visit.   Past Medical History  Diagnosis Date   Hypertension    OSA (obstructive sleep apnea)    Anemia    Positive PPD, treated 2000    INH   Obesity    Hemorrhoids    LVH (left ventricular hypertrophy)     Dr. Donnie Aho ( cardiology)   Kidney insufficiency    Vitamin D deficiency    Headache(784.0)    Allergy      Home Meds: Prior to Admission medications   Medication Sig Start Date End Date Taking? Authorizing Provider  BYSTOLIC 20 MG TABS Take 1 tablet (20 mg total) by mouth 2 (two) times daily. 01/02/15  Yes Elvina Sidle, MD  hyoscyamine (LEVSIN/SL) 0.125 MG SL tablet Place 1 tablet (0.125 mg total) under  the tongue at bedtime and may repeat dose one time if needed. 01/10/15  Yes Elvina Sidle, MD  losartan (COZAAR) 100 MG tablet Take 1 tablet (100 mg total) by mouth daily. 03/19/14  Yes Stephanie D English, PA  meloxicam (MOBIC) 7.5 MG tablet Take 1 tablet (7.5 mg total) by mouth daily. 01/02/15  Yes Elvina Sidle, MD  polyethylene glycol powder (GLYCOLAX/MIRALAX) powder Take 17 g by mouth 2 (two) times daily as needed. 07/13/14  Yes Elvina Sidle, MD  ranitidine (ZANTAC) 150 MG tablet Take 1 tablet (150 mg total) by mouth 2 (two) times daily. 01/02/15  Yes Elvina Sidle, MD  amoxicillin-clavulanate (AUGMENTIN) 875-125 MG per tablet Take 1 tablet by mouth 2 (two) times daily. Patient not taking: Reported on 02/22/2015 01/10/15   Elvina Sidle, MD  butalbital-acetaminophen-caffeine (FIORICET) [phone] MG per tablet Take 1-2 tablets by mouth every 6 (six) hours as needed for headache. Patient not taking: Reported on 01/10/2015 09/19/14 09/19/15  Elvina Sidle, MD  indomethacin (INDOCIN) 50 MG capsule Take 1 capsule (50 mg total) by mouth 2 (two) times daily with a meal. Patient not taking: Reported on 01/02/2015 10/10/14   Elvina Sidle, MD    Allergies:  Allergies  Allergen Reactions   Lisinopril     angioedema    Social  History   Social History   Marital Status: Married    Spouse Name: N/A   Number of Children: N/A   Years of Education: N/A   Occupational History   Not on file.   Social History Main Topics   Smoking status: Never Smoker    Smokeless tobacco: Never Used   Alcohol Use: 0.0 oz/week    0 Standard drinks or equivalent per week     Comment: Sometimes   Drug Use: No   Sexual Activity: Yes   Other Topics Concern   Not on file   Social History Narrative     Review of Systems: Constitutional: negative for chills, fever, night sweats, weight changes, or fatigue  HEENT: negative for vision changes, hearing loss, congestion, rhinorrhea, ST, epistaxis, or  sinus pressure Cardiovascular: negative for chest pain or palpitations Respiratory: negative for shortness of breath, hemoptysis, wheezing, or cough Abdominal: negative for abdominal pain, nausea, vomiting, diarrhea, or constipation GU: + urinary frequency, + flank pain Dermatological: negative for rash Neurologic: negative for headache, dizziness, or syncope All other systems reviewed and are otherwise negative with the exception to those above and in the HPI.  Physical Exam: Blood pressure 140/92, manual BP during examination: 150/95, pulse 43, temperature 98.1 F (36.7 C), temperature source Oral, resp. rate 20, height 5' 11.75" (1.822 m), weight 245 lb 4 oz (111.245 kg), SpO2 98 %., Body mass index is 33.51 kg/(m^2). General: Well developed, well nourished, in no acute distress. Head: Normocephalic, atraumatic, eyes without discharge, sclera non-icteric, nares are without discharge. Bilateral auditory canals clear, TM's are without perforation, pearly grey and translucent with reflective cone of light bilaterally. Oral cavity moist, posterior pharynx without exudate, erythema, peritonsillar abscess, or post nasal drip.  Neck: Supple. No thyromegaly. Full ROM. No lymphadenopathy. Lungs: Clear bilaterally to auscultation without wheezes, rales, or rhonchi. Breathing is unlabored. Heart: RRR with S1 S2. No murmurs, rubs, or gallops appreciated. Abdomen: Soft, non-distended with normoactive bowel sounds. No hepatomegaly. No rebound/guarding. No obvious abdominal masses. Mildly tender L flank.  Msk:  Strength and tone normal for age.  Extremities/Skin: Warm and dry. No clubbing or cyanosis. No edema. No rashes or suspicious lesions. Neuro: Alert and oriented X 3. Moves all extremities spontaneously. Gait is normal. CNII-XII grossly in tact. Psych:  Responds to questions appropriately with a normal affect.   Labs: Results for orders placed or performed in visit on 02/22/15  POCT urinalysis  dipstick  Result Value Ref Range   Color, UA yellow    Clarity, UA clear    Glucose, UA neg    Bilirubin, UA neg    Ketones, UA neg    Spec Grav, UA 1.015    Blood, UA neg    pH, UA 7.0    Protein, UA neg    Urobilinogen, UA 0.2    Nitrite, UA neg    Leukocytes, UA Negative Negative  POCT UA - Microscopic Only  Result Value Ref Range   WBC, Ur, HPF, POC neg    RBC, urine, microscopic neg    Bacteria, U Microscopic neg    Mucus, UA neg    Epithelial cells, urine per micros neg    Crystals, Ur, HPF, POC neg    Casts, Ur, LPF, POC neg    Yeast, UA neg      ASSESSMENT AND PLAN:  49 y.o. year old male with  1. Abdominal pain, left lower quadrant   2. Left flank pain, chronic   3. Immunization  counseling   4. Cough    This chart was scribed in my presence and reviewed by me personally.    ICD-9-CM ICD-10-CM   1. Abdominal pain, left lower quadrant 789.04 R10.32 POCT urinalysis dipstick     POCT UA - Microscopic Only     COMPLETE METABOLIC PANEL WITH GFR  2. Left flank pain, chronic 789.09 R10.12 POCT urinalysis dipstick   338.29 G89.29 POCT UA - Microscopic Only     COMPLETE METABOLIC PANEL WITH GFR  3. Immunization counseling V65.49 Z71.89 Flu Vaccine QUAD 36+ mos IM  4. Cough 786.2 R05 azithromycin (ZITHROMAX) 250 MG tablet     Signed, Elvina Sidle, MD 02/22/2015 12:13 PM

## 2015-02-22 NOTE — Addendum Note (Signed)
Addended by: Elvina Sidle on: 02/22/2015 12:25 PM   Modules accepted: Orders

## 2015-02-23 ENCOUNTER — Telehealth: Payer: Self-pay

## 2015-02-23 ENCOUNTER — Other Ambulatory Visit: Payer: Self-pay | Admitting: Physician Assistant

## 2015-02-23 DIAGNOSIS — I10 Essential (primary) hypertension: Secondary | ICD-10-CM

## 2015-02-23 MED ORDER — LOSARTAN POTASSIUM 100 MG PO TABS: 100.0000 mg | ORAL_TABLET | Freq: Every day | ORAL | Status: DC

## 2015-02-23 NOTE — Telephone Encounter (Signed)
Dr. Elbert Ewings, pt came in last night for a Rx RF as well as the cough and abd pain. Wants a RF of Losartan. Was just going to go ahead and RF it but pt has Angioedema with  Lisinopril, so was uncomfortable with that. Does he need to RTC again for HTN discussion?

## 2015-02-24 ENCOUNTER — Encounter: Payer: Self-pay | Admitting: Family Medicine

## 2015-02-24 NOTE — Telephone Encounter (Signed)
Spoke with pt, advised message from Dr. L. Pt understood. 

## 2015-02-24 NOTE — Telephone Encounter (Signed)
Losartan is used instead of lisinopril precisely because of the problem of angioedema with lisinopril.  Losartan should be safe and effective.

## 2015-03-04 ENCOUNTER — Telehealth: Payer: Self-pay

## 2015-03-04 NOTE — Telephone Encounter (Signed)
Returned pt cll for lab results. Advised all lab results were normal. Pt stated he received copies of lab results in the mail already. Pt stated he understood all lab results.

## 2015-03-30 ENCOUNTER — Other Ambulatory Visit: Payer: Self-pay

## 2015-03-30 DIAGNOSIS — R1012 Left upper quadrant pain: Secondary | ICD-10-CM

## 2015-03-30 NOTE — Telephone Encounter (Signed)
Dr L, do you want to give pt RFs on his meloxicam?

## 2015-03-31 MED ORDER — MELOXICAM 7.5 MG PO TABS: 7.5000 mg | ORAL_TABLET | Freq: Every day | ORAL | Status: DC

## 2015-04-27 ENCOUNTER — Ambulatory Visit: Payer: 59

## 2015-05-29 ENCOUNTER — Ambulatory Visit (INDEPENDENT_AMBULATORY_CARE_PROVIDER_SITE_OTHER): Payer: 59 | Admitting: Internal Medicine

## 2015-05-29 VITALS — BP 175/90 | HR 41 | Temp 98.2°F | Resp 16 | Ht 71.0 in | Wt 252.4 lb

## 2015-05-29 DIAGNOSIS — R1032 Left lower quadrant pain: Secondary | ICD-10-CM

## 2015-05-29 DIAGNOSIS — M25562 Pain in left knee: Secondary | ICD-10-CM

## 2015-05-29 DIAGNOSIS — R748 Abnormal levels of other serum enzymes: Secondary | ICD-10-CM | POA: Diagnosis not present

## 2015-05-29 DIAGNOSIS — R7989 Other specified abnormal findings of blood chemistry: Secondary | ICD-10-CM | POA: Insufficient documentation

## 2015-05-29 MED ORDER — DICLOFENAC SODIUM 3 % TD GEL: 1.0000 "application " | Freq: Two times a day (BID) | TRANSDERMAL | Status: DC

## 2015-05-29 MED ORDER — CILIDINIUM-CHLORDIAZEPOXIDE 2.5-5 MG PO CAPS: 1.0000 | ORAL_CAPSULE | Freq: Every day | ORAL | Status: DC

## 2015-05-29 NOTE — Patient Instructions (Signed)
Patellar Tendinitis With Rehab  Tendinitis is inflammation of a tendon. Tendonitis of the tendon below the kneecap (patella) is known as patellar tendonitis. Patellar tendonitis is also called jumper's knee. Jumper's knee is a common cause of pain below the kneecap (infrapatellar). Jumper's knee may involve a tear (strain) in the ligament. Strains are classified into three categories. Grade 1 strains cause pain, but the tendon is not lengthened. Grade 2 strains include a lengthened ligament, due to the ligament being stretched or partially ruptured. With grade 2 strains there is still function, although function may be decreased. Grade 3 strains involve a complete tear of the tendon or muscle, and function is usually impaired. Patellar tendon strains are usually grade 1 or 2.   SYMPTOMS   · Pain, tenderness, swelling, warmth, or redness over the patellar tendon (just below the kneecap).  · Pain and loss of strength (sometimes), with forcefully straightening the knee (especially when jumping or rising from a seated or squatting position), or bending the knee completely (squatting or kneeling).  · Crackling sound (crepitation) when the tendon is moved or touched.  CAUSES   Patellar tendonitis is caused by injury to the patellar tendon. The inflammation is the body's healing response. Common causes of injury include:  · Stress from a sudden increase in intensity, frequency, or duration of training.  · Overuse of the thigh muscles (quadriceps) and patellar tendon.  · Direct hit (trauma) to the knee or patellar tendon.  RISK INCREASES WITH:  · Sports that require sudden, explosive quadriceps contraction, such as jumping, quick starts, or kicking.  · Running sports, especially running down hills.  · Poor strength and flexibility of the thigh and knee.  · Flat feet.  PREVENTION  · Warm up and stretch properly before activity.  · Allow for adequate recovery between workouts.  · Maintain physical fitness:    Strength,  flexibility, and endurance.    Cardiovascular fitness.  · Protect the knee joint with taping, protective strapping, bracing, or elastic compression bandage.  · Wear arch supports (orthotics).  PROGNOSIS   If treated properly, patellar tendonitis usually heals within 6 weeks.   RELATED COMPLICATIONS   · Longer healing time if not properly treated or if not given enough time to heal.  · Recurring symptoms if activity is resumed too soon, with overuse, with a direct blow, or when using poor technique.  · If untreated, tendon rupture requiring surgery.  TREATMENT  Treatment first involves the use of ice and medicine to reduce pain and inflammation. The use of strengthening and stretching exercises may help reduce pain with activity. These exercises may be performed at home or with a therapist. Serious cases of tendonitis may require restraining the knee for 10 to 14 days to prevent stress on the tendon and to promote healing. Crutches may be used (uncommon) until you can walk without a limp. For cases in which nonsurgical treatment is unsuccessful, surgery may be advised to remove the inflamed tendon lining (sheath). Surgery is rare, and is only advised after at least 6 months of nonsurgical treatment.  MEDICATION   · If pain medicine is needed, nonsteroidal anti-inflammatory medicines (aspirin and ibuprofen), or other minor pain relievers (acetaminophen), are often advised.  · Do not take pain medicine for 7 days before surgery.  · Prescription pain relievers may be given if your caregiver thinks they are needed. Use only as directed and only as much as you need.  HEAT AND COLD  · Cold treatment (icing)   should be applied for 10 to 15 minutes every 2 to 3 hours for inflammation and pain, and immediately after activity that aggravates your symptoms. Use ice packs or an ice massage.  · Heat treatment may be used before performing stretching and strengthening activities prescribed by your caregiver, physical therapist, or  athletic trainer. Use a heat pack or a warm water soak.  SEEK MEDICAL CARE IF:  · Symptoms get worse or do not improve in 2 weeks, despite treatment.  · New, unexplained symptoms develop. (Drugs used in treatment may produce side effects.)  EXERCISES  RANGE OF MOTION (ROM) AND STRETCHING EXERCISES - Patellar Tendinitis (Jumper's Knee)  These are some of the initial exercises with which you may start your rehabilitation program, until you see your caregiver again or until your symptoms are resolved. Remember:   · Flexible tissue is more tolerant of the stresses placed on it during activities.  · Each stretch should be held for 20 to 30 seconds.  · A gentle stretching sensation should be felt.  STRETCH - Hamstrings, Supine  · Lie on your back. Loop a belt or towel over the ball of your right / left foot.  · Straighten your right / left knee and slowly pull on the belt to raise your leg. Do not allow the right / left knee to bend. Keep your opposite leg flat on the floor.  · Raise the leg until you feel a gentle stretch behind your right / left knee or thigh. Hold this position for __________ seconds.  Repeat __________ times. Complete this stretch __________ times per day.   STRETCH - Hamstrings, Doorway  · Lie on your back with your right / left leg extended and resting on the wall, and the opposite leg flat on the ground through the door. At first, position your bottom farther away from the wall.  · Keep your right / left knee straight. If you feel a stretch behind your knee or thigh, hold this position for __________ seconds.  · If you do not feel a stretch, scoot your bottom closer to the door, and hold __________ seconds.  Repeat __________ times. Complete this stretch __________ times per day.   STRETCH - Hamstrings, Standing  · Stand or sit and extend your right / left leg, placing your foot on a chair or foot stool.  · Keep a slight arch in your low back and your hips straight forward.  · Lead with your chest  and lean forward at the waist until you feel a gentle stretch in the back of your right / left knee or thigh. (When done correctly, this exercise requires leaning only a small distance.)  · Hold this position for __________ seconds.  Repeat __________ times. Complete this stretch __________ times per day.  STRETCH - Adductors, Lunge  · While standing, spread your legs, with your right / left leg behind you.  · Lean away from your right / left leg by bending your opposite knee. You may rest your hands on your thigh for balance.  · You should feel a stretch in your right / left inner thigh. Hold for __________ seconds.  Repeat __________ times. Complete this exercise __________ times per day.   STRENGTHENING EXERCISES - Patellar Tendinitis (Jumper's Knee)  These exercises may help you when beginning to rehabilitate your injury. They may resolve your symptoms with or without further involvement from your physician, physical therapist or athletic trainer. While completing these exercises, remember:   · Muscles   can gain both the endurance and the strength needed for everyday activities through controlled exercises.  · Complete these exercises as instructed by your physician, physical therapist or athletic trainer. Increase the resistance and repetitions only as guided by your caregiver.  STRENGTH - Quadriceps, Isometrics  · Lie on your back with your right / left leg extended and your opposite knee bent.  · Gradually tense the muscles in the front of your right / left thigh. You should see either your kneecap slide up toward your hip or increased dimpling just above the knee. This motion will push the back of the knee down toward the floor, mat, or bed on which you are lying.  · Hold the muscle as tight as you can, without increasing your pain, for __________ seconds.  · Relax the muscles slowly and completely in between each repetition.  Repeat __________ times. Complete this exercise __________ times per day.      STRENGTH - Quadriceps, Short Arcs  · Lie on your back. Place a __________ inch towel roll under your right / left knee, so that the knee bends slightly.  · Raise only your lower leg by tightening the muscles in the front of your thigh. Do not allow your thigh to rise.  · Hold this position for __________ seconds.  Repeat __________ times. Complete this exercise __________ times per day.   OPTIONAL ANKLE WEIGHTS: Begin with ____________________, but DO NOT exceed ____________________. Increase in 1 pound/ 0.5 kilogram increments.  STRENGTH - Quadriceps, Straight Leg Raises   Quality counts! Watch for signs that the quadriceps muscle is working, to be sure you are strengthening the correct muscles and not "cheating" by substituting with healthier muscles.  · Lay on your back with your right / left leg extended and your opposite knee bent.  · Tense the muscles in the front of your right / left thigh. You should see either your kneecap slide up or increased dimpling just above the knee. Your thigh may even shake a bit.  · Tighten these muscles even more and raise your leg 4 to 6 inches off the floor. Hold for __________ seconds.  · Keeping these muscles tense, lower your leg.  · Relax the muscles slowly and completely between each repetition.  Repeat __________ times. Complete this exercise __________ times per day.   STRENGTH - Quadriceps, Squats  · Stand in a door frame so that your feet and knees are in line with the frame.  · Use your hands for balance, not support, on the frame.  · Slowly lower your weight, bending at the hips and knees. Keep your lower legs upright so that they are parallel with the door frame. Squat only within the range that does not increase your knee pain. Never let your hips drop below your knees.  · Slowly return upright, pushing with your legs, not pulling with your hands.  Repeat __________ times. Complete this exercise __________ times per day.   STRENGTH - Quadriceps,  Step-Downs  · Stand on the edge of a step stool or stair. Be prepared to use a countertop or wall for balance, if needed.  · Keeping your right / left knee directly over the middle of your foot, slowly touch your opposite heel to the floor or lower step. Do not go all the way to the floor if your knee pain increases; just go as far as you can without increased discomfort. Use your right / left leg muscles, not gravity to lower your   body weight.  · Slowly push your body weight back up to the starting position.  Repeat __________ times. Complete this exercise __________ times per day.      This information is not intended to replace advice given to you by your health care provider. Make sure you discuss any questions you have with your health care provider.     Document Released: 06/05/2005 Document Revised: 10/20/2014 Document Reviewed: 09/17/2008  Elsevier Interactive Patient Education ©2016 [employer]

## 2015-05-29 NOTE — Progress Notes (Signed)
Subjective:  This chart was scribed for Raymond Siaobert Hannah Strader, MD by [name], Medical Scribe. This patient was seen in Room 1 and the patient's care was started at 11:49 AM.    Patient ID: Raymond Green, male    DOB: [birth year], 49 y.o.   MRN: [id number] Chief Complaint  Patient presents with  . Abdominal Pain    LLQ-radiates to back-occurs mostly a night  . Knee Pain    HPI Raymond Green A Knisley is a 49 y.o. male who [facility] complaining of gradual onset left knee pain that was noticed recently. He informs that he was doing spin classes at [employer]. He states that it hurts when he stands up. It's not too swollen versus the right knee. No past knee problems. No give way. No early morning swelling. All the pain is anteriorly.  He also notes some LLQ abd pain that radiates to his back. When he lays down at night to sleep, the pain keeps recurring. He was previously seen by Dr. Milus GlazierLauenstein for this symptom. [facility] and they didn't find anything significant. He is taking colace currently.  Labs and scans reviewed - underlying disease has been considered and ruled out. He has no new symptoms of hematochezia, diarrhea, genitourinary complaints, lumbar radiculopathy complaints.  Patient Active Problem List   Diagnosis Date Noted  . Gout 10/29/2013  . HYPERTENSION 03/02/2008  . GERD 03/02/2008  . ANEMIA-IRON DEFICIENCY 12/13/2007  . HEMORRHOIDS-EXTERNAL 12/13/2007    Current outpatient prescriptions:  .  BYSTOLIC 20 MG TABS, Take 1 tablet (20 mg total) by mouth 2 (two) times daily., Disp: 180 tablet, Rfl: 3 .  docusate sodium (COLACE) 100 MG capsule, Take 100 mg by mouth daily., Disp: , Rfl:  .  hyoscyamine (LEVSIN/SL) 0.125 MG SL tablet, Place 1 tablet (0.125 mg total) under the tongue at bedtime and may repeat dose one time if needed., Disp: 30 tablet, Rfl: 0 .  losartan (COZAAR) 100 MG tablet, Take 1 tablet (100 mg total) by mouth daily., Disp: 30  tablet, Rfl: 5    Review of Systems  Constitutional: Negative for fever, chills, diaphoresis and fatigue.  Cardiovascular: Negative for chest pain.  Gastrointestinal: Positive for abdominal pain (LLQ). Negative for nausea and vomiting.  Musculoskeletal: Positive for arthralgias (left knee). Negative for myalgias, joint swelling and gait problem.  Skin: Negative for rash and wound.       Objective:   Physical Exam  Constitutional: He is oriented to person, place, and time. He appears well-developed and well-nourished. No distress.  HENT:  Head: Normocephalic and atraumatic.  Eyes: EOM are normal. Pupils are equal, round, and reactive to light.  Neck: Neck supple.  Cardiovascular: Normal rate.   Pulmonary/Chest: Effort normal. No respiratory distress.  Abdominal:  Discomfort in LLQ cannot be caused by exam, there's no lumbar ROM or flank rotation to create discomfort   Musculoskeletal: Normal range of motion.  Left knee: no swelling, but is tender over the infrapatellar tendon with pain in full flexion, patella ballottes freely, no ligament tenderness, or jointline tenderness; palatial  tenderness, no quadriceps tenderness  Neurological: He is alert and oriented to person, place, and time.  Skin: Skin is warm and dry.  Psychiatric: He has a normal mood and affect. His behavior is normal.  Nursing note and vitals reviewed.   BP 175/90 mmHg  Pulse 41  Temp(Src) 98.2 F (36.8 C) (Oral)  Resp 16  Ht 5\' 11"  (1.803 m)  Wt 252 lb 6 oz (114.477 kg)  BMI 35.21 kg/m2  SpO2 98%     Assessment & Plan:  Knee pain, left anterior--infrapatellar tendinitis -Exercises given -Continue working out [employer] after exercise  Elevated serum creatinine since 2013 so will use topical nsaid  LLQ abdominal pain-he has been evaluated extensively without a clear etiology. Because his pain is waking him at night and is on the left side this most likely represents gas collection in the distal colon  with distention and he will be given a trial of Librax at bedtime.  Meds ordered this encounter  Medications  . docusate sodium (COLACE) 100 MG capsule---he has this for 2 weeks    Sig: Take 100 mg by mouth daily.  . Diclofenac Sodium 3 % GEL    Sig: Place 1 application onto the skin 2 (two) times daily.    Dispense:  50 g    Refill:  2  . clidinium-chlordiazePOXIDE (LIBRAX) 5-2.5 MG capsule    Sig: Take 1 capsule by mouth at bedtime. To prevent abdominal pain    Dispense:  30 capsule    Refill:  2      By signing my name below, I, [name], attest that this documentation has been prepared under the direction and in the presence of Raymond Sia, MD. Electronically Signed: [name], Scribe. 05/29/2015 , 11:55 AM .  I have completed the patient encounter in its entirety as documented by the scribe, with editing by me where necessary. Dailey Alberson P. Merla Riches, M.D.

## 2015-06-22 MED FILL — OMEPRAZOLE DR 40 MG CAPSULE: 40 | 30 days supply | Qty: 60 | Fill #0

## 2015-06-30 MED FILL — LOSARTAN POTASSIUM 100 MG T: 100 | 90 days supply | Qty: 90 | Fill #1

## 2015-09-01 ENCOUNTER — Ambulatory Visit (INDEPENDENT_AMBULATORY_CARE_PROVIDER_SITE_OTHER): Payer: 59 | Admitting: Family Medicine

## 2015-09-01 ENCOUNTER — Encounter: Payer: Self-pay | Admitting: Family Medicine

## 2015-09-01 ENCOUNTER — Encounter: Payer: 59 | Admitting: Family Medicine

## 2015-09-01 VITALS — BP 140/100 | HR 61 | Temp 98.1°F | Resp 16 | Ht 71.0 in | Wt 255.6 lb

## 2015-09-01 DIAGNOSIS — G4733 Obstructive sleep apnea (adult) (pediatric): Secondary | ICD-10-CM | POA: Diagnosis not present

## 2015-09-01 DIAGNOSIS — Z0000 Encounter for general adult medical examination without abnormal findings: Secondary | ICD-10-CM | POA: Diagnosis not present

## 2015-09-01 DIAGNOSIS — Z114 Encounter for screening for human immunodeficiency virus [HIV]: Secondary | ICD-10-CM | POA: Diagnosis not present

## 2015-09-01 DIAGNOSIS — Z13220 Encounter for screening for lipoid disorders: Secondary | ICD-10-CM | POA: Diagnosis not present

## 2015-09-01 DIAGNOSIS — I10 Essential (primary) hypertension: Secondary | ICD-10-CM | POA: Diagnosis not present

## 2015-09-01 DIAGNOSIS — Z23 Encounter for immunization: Secondary | ICD-10-CM | POA: Diagnosis not present

## 2015-09-01 DIAGNOSIS — Z125 Encounter for screening for malignant neoplasm of prostate: Secondary | ICD-10-CM

## 2015-09-01 DIAGNOSIS — K219 Gastro-esophageal reflux disease without esophagitis: Secondary | ICD-10-CM | POA: Diagnosis not present

## 2015-09-01 DIAGNOSIS — R7989 Other specified abnormal findings of blood chemistry: Secondary | ICD-10-CM | POA: Diagnosis not present

## 2015-09-01 DIAGNOSIS — R748 Abnormal levels of other serum enzymes: Secondary | ICD-10-CM | POA: Diagnosis not present

## 2015-09-01 DIAGNOSIS — K625 Hemorrhage of anus and rectum: Secondary | ICD-10-CM | POA: Diagnosis not present

## 2015-09-01 DIAGNOSIS — R1032 Left lower quadrant pain: Secondary | ICD-10-CM | POA: Diagnosis not present

## 2015-09-01 DIAGNOSIS — Z1159 Encounter for screening for other viral diseases: Secondary | ICD-10-CM | POA: Diagnosis not present

## 2015-09-01 LAB — LIPID PANEL
CHOLESTEROL: 197 mg/dL (ref 125–200)
HDL: 46 mg/dL (ref >=40)
LDL Cholesterol: 120 mg/dL (ref <130)
Total CHOL/HDL Ratio: 4.3 Ratio (ref <=5.0)
Triglycerides: 155 mg/dL — ABNORMAL HIGH (ref <150)
VLDL: 31 mg/dL — AB (ref <30)

## 2015-09-01 LAB — COMPLETE METABOLIC PANEL WITH GFR
ALT: 15 U/L (ref 9–46)
AST: 21 U/L (ref 10–40)
Albumin: 4.2 g/dL (ref 3.6–5.1)
Alkaline Phosphatase: 54 U/L (ref 40–115)
BUN: 16 mg/dL (ref 7–25)
CHLORIDE: 103 mmol/L (ref 98–110)
CO2: 29 mmol/L (ref 20–31)
Calcium: 10 mg/dL (ref 8.6–10.3)
Creat: 1.5 mg/dL — ABNORMAL HIGH (ref 0.60–1.35)
GFR, Est African American: 62 mL/min (ref >=60)
GFR, Est Non African American: 54 mL/min — ABNORMAL LOW (ref >=60)
GLUCOSE: 96 mg/dL (ref 65–99)
POTASSIUM: 4.5 mmol/L (ref 3.5–5.3)
Sodium: 140 mmol/L (ref 135–146)
Total Bilirubin: 0.5 mg/dL (ref 0.2–1.2)
Total Protein: 7.1 g/dL (ref 6.1–8.1)

## 2015-09-01 MED ORDER — NEBIVOLOL HCL 20 MG PO TABS: 1.0000 | ORAL_TABLET | Freq: Two times a day (BID) | ORAL | Status: DC

## 2015-09-01 MED ORDER — HYDROCHLOROTHIAZIDE 12.5 MG PO CAPS: 12.5000 mg | ORAL_CAPSULE | Freq: Every day | ORAL | Status: DC

## 2015-09-01 MED ORDER — OMEPRAZOLE 40 MG PO CPDR: 40.0000 mg | DELAYED_RELEASE_CAPSULE | Freq: Every day | ORAL | Status: DC

## 2015-09-01 MED ORDER — LOSARTAN POTASSIUM 100 MG PO TABS: 100.0000 mg | ORAL_TABLET | Freq: Every day | ORAL | Status: DC

## 2015-09-01 MED FILL — BYSTOLIC 20 MG TABLET: 20 | 90 days supply | Qty: 180 | Fill #0

## 2015-09-01 MED FILL — HYDROCHLOROTHIAZIDE 12.5 MG: 12.5 | 90 days supply | Qty: 90 | Fill #0

## 2015-09-01 MED FILL — OMEPRAZOLE DR 40 MG CAPSULE: 40 | 90 days supply | Qty: 90 | Fill #0

## 2015-09-01 NOTE — Progress Notes (Signed)
   Subjective:    Patient ID: Raymond Green, male    DOB: [birth year], [age] y.o.   MRN: [id number]  HPI    Review of Systems  Constitutional: Negative.   HENT: Positive for rhinorrhea and sinus pressure.   Eyes: Negative.   Respiratory: Negative.   Cardiovascular: Negative.   Gastrointestinal: Positive for abdominal pain.  Endocrine: Negative.   Genitourinary: Positive for flank pain.  Musculoskeletal: Negative.   Skin: Negative.   Allergic/Immunologic: Positive for environmental allergies and food allergies.  Neurological: Negative.   Hematological: Negative.   Psychiatric/Behavioral: Negative.        Objective:   Physical Exam        Assessment & Plan:

## 2015-09-01 NOTE — Patient Instructions (Addendum)
Continue colace once per day, miralax as needed to keep from becoming constipated. Drink plenty of fluids and fiber in the diet. I will refer you to Dr. Ewing SchleinMagod, but can also follow up with me to determine other workup if needed after visit with Dr. Ewing SchleinMagod. Tylenol if needed.    Start new blood pressure medicine and follow up in next 6 weeks.   I will refer you to sleep studies to discuss sleep apnea repeat testing or treatment options.   Return to the clinic or go to the nearest emergency room if any of your symptoms worsen or new symptoms occur.  Keeping you healthy  Get these tests  Blood pressure- Have your blood pressure checked once a year by your healthcare provider.  Normal blood pressure is 120/80.  Weight- Have your body mass index (BMI) calculated to screen for obesity.  BMI is a measure of body fat based on height and weight. You can also calculate your own BMI at [email or web address]-[email or web address]/.  Cholesterol- Have your cholesterol checked regularly starting at age 50, sooner may be necessary if you have diabetes, high blood pressure, if a family member developed heart diseases at an early age or if you smoke.   Chlamydia, HIV, and other sexual transmitted disease- Get screened each year until the age of 50 then within three months of each new sexual partner.  Diabetes- Have your blood sugar checked regularly if you have high blood pressure, high cholesterol, a family history of diabetes or if you are overweight.  Get these vaccines  Flu shot- Every fall.  Tetanus shot- Every 10 years.  Menactra- Single dose; prevents meningitis.  Take these steps  Don't smoke- If you do smoke, ask your healthcare provider about quitting. For tips on how to quit, go to www.smokefree.gov or call 1-800-QUIT-NOW.  Be physically active- Exercise 5 days a week for at least 30 minutes.  If you are not already physically active start slow and gradually work up to 30 minutes of moderate physical  activity.  Examples of moderate activity include walking briskly, mowing the yard, dancing, swimming bicycling, etc.  Eat a healthy diet- Eat a variety of healthy foods such as fruits, vegetables, low fat milk, low fat cheese, yogurt, lean meats, poultry, fish, beans, tofu, etc.  For more information on healthy eating, go to www.thenutritionsource.org  Drink alcohol in moderation- Limit alcohol intake two drinks or less a day.  Never drink and drive.  Dentist- Brush and floss teeth twice daily; visit your dentis twice a year.  Depression-Your emotional health is as important as your physical health.  If you're feeling down, losing interest in things you normally enjoy please talk with your healthcare provider.  Gun Safety- If you keep a gun in your home, keep it unloaded and with the safety lock on.  Bullets should be stored separately.  Helmet use- Always wear a helmet when riding a motorcycle, bicycle, rollerblading or skateboarding.  Safe sex- If you may be exposed to a sexually transmitted infection, use a condom  Seat belts- Seat bels can save your life; always wear one.  Smoke/Carbon Monoxide detectors- These detectors need to be installed on the appropriate level of your home.  Replace batteries at least once a year.  Skin Cancer- When out in the sun, cover up and use sunscreen SPF 15 or higher.  Violence- If anyone is threatening or hurting you, please tell your healthcare provider.

## 2015-09-01 NOTE — Progress Notes (Signed)
Subjective:  By signing my name below, I, Raven Small, attest that this documentation has been prepared under the direction and in the presence of [name], MD.  Electronically Signed: Andrew Au, ED Scribe. 09/01/2015. 2:40 PM.   Patient ID: Raymond Green, male    DOB: January 06, 1966, 50 y.o.   MRN: [id number]  HPI Chief Complaint  Patient presents with  . Annual Exam  . Medication Refill    Omeprazole 40 mg    HPI Comments: Raymond Green is a 50 y.o. male who presents to the [facility] Annual Exam.  This is a new pt to me. He has been seen by multiple provider here but identified Dr. Perrin Maltese as PCP.   Hx of HTN, GERD, anemia and eval for LLQ abdominal pain and flank pain last year.  Hypertension He takes losartan 100 mg qd and bystolic 50 mg. Pt has been compliant with medication. He does not check BP outside of office.   GERD He is on prilosec 40 mg qd. He has had recurrent LLQ abdominal pain last year. He had CT abdomen by Dr. Madilyn Fireman in 10/2014 without an explanation of pain. An MRI of abdomen in 01/2015 that was unremarkable and did not indicated cause of pain. He was evaluated by GI per note by Dr. Merla Riches on 12/10. He takes Librex at bedtime for possible gas collection with pain at night.   He states Librex prescribed by Dr. Merla Riches did not help with pain. He is still having pain to LLQ. He believes pain is in abdominal muscles. He takes ibuprofen about once week for abdominal pain.   He denies LLQ pain radiating down legs. No back pain or urinary symptoms. He would like a second opinion, specifically to Dr. Doneta Public. He has not been seen by general surgeon.   Pt also reports rectal bleeding consisting of bright red blood. He has spoke to GI about this issue and was prescribed a lotion that he applies to anus. He notices bleeding after BM. He states he is constipated about 1 day every 2 weeks. He denies rectal pain.   Elevated creatinine  Most  recent creatinine 02/2015 1.43. Lower than previous readings in 2016. He has been 1.4 range since 2014.   Cancer screening Colonoscopy: 09/2014 by Dr. Madilyn Fireman single 4mm polyp repeat in 5-10 years Prostate:   Lab Results  Component Value Date   PSA 0.27 12/26/2013   Pt agrees to prostate testing today.   Immunizations  Immunization History  Administered Date(s) Administered  . Influenza,inj,Quad PF,36+ Mos 03/23/2014, 02/22/2015  . Meningococcal Conjugate 01/12/2014   TDAP- pt agrees to getting this today.  Depression Depression screen [facility] 2/9 09/01/2015 05/29/2015 02/22/2015 01/10/2015 01/02/2015  Decreased Interest 0 0 0 0 0  Down, Depressed, Hopeless 0 0 0 0 0  PHQ - 2 Score 0 0 0 0 0   Fall screening: no falls in the past year  Vision  Visual Acuity Screening   Right eye Left eye Both eyes  Without correction:     With correction:   Pt was last seen by optho last years  Dentist He was last seen by dentist 3 years ago.  Exercise Pt does not exercise due to knee pain. He states he is trying to get back into it.   Hx of Sleep apnea He has hx of sleep apnea. He stopped using cpap 5 years ago. He did not like sleeping with mask.  HIV and STI testing Pt agrees to HIV testing today. He does not feel the need fo STI testing.   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   Patient Active Problem List   Diagnosis Date Noted  . Elevated serum creatinine 05/29/2015  . Gout 10/29/2013  . HYPERTENSION 03/02/2008  . GERD 03/02/2008  . ANEMIA-IRON DEFICIENCY 12/13/2007  . HEMORRHOIDS-EXTERNAL 12/13/2007   Past Medical History  Diagnosis Date  . Hypertension   . OSA (obstructive sleep apnea)   . Anemia   . Positive PPD, treated 2000    INH  . Obesity   . Hemorrhoids   . LVH (left ventricular hypertrophy)     Dr. Donnie Ahoilley ( cardiology)  . Kidney insufficiency   . Vitamin D deficiency   . Headache(784.0)   . Allergy    Past Surgical History    Procedure Laterality Date  . Hemorrhoid surgery    . Colonoscopy  12/28/2011    Procedure: COLONOSCOPY;  Surgeon: Rachael Feeaniel P Jacobs, MD;  Location: [facility];  Service: Endoscopy;  Laterality: N/A;   Allergies  Allergen Reactions  . Lisinopril     angioedema   Prior to Admission medications   Medication Sig Start Date End Date Taking? Authorizing Provider  BYSTOLIC 20 MG TABS Take 1 tablet (20 mg total) by mouth 2 (two) times daily. 01/02/15  Yes [name], MD  docusate sodium (COLACE) 100 MG capsule Take 100 mg by mouth daily.   Yes Historical Provider, MD  losartan (COZAAR) 100 MG tablet Take 1 tablet (100 mg total) by mouth daily. 02/23/15  Yes Ofilia NeasMichael L Clark, PA-C  omeprazole (PRILOSEC) 40 MG capsule Take 40 mg by mouth daily.   Yes Historical Provider, MD  clidinium-chlordiazePOXIDE (LIBRAX) 5-2.5 MG capsule Take 1 capsule by mouth at bedtime. To prevent abdominal pain Patient not taking: Reported on 09/01/2015 05/29/15   Tonye Pearsonobert P Doolittle, MD  Diclofenac Sodium 3 % GEL Place 1 application onto the skin 2 (two) times daily. Patient not taking: Reported on 09/01/2015 05/29/15   Tonye Pearsonobert P Doolittle, MD  hyoscyamine (LEVSIN/SL) 0.125 MG SL tablet Place 1 tablet (0.125 mg total) under the tongue at bedtime and may repeat dose one time if needed. Patient not taking: Reported on 09/01/2015 01/10/15   [name], MD   Social History   Social History  . Marital Status: Married    Spouse Name: N/A  . Number of Children: N/A  . Years of Education: N/A   Occupational History  . Endoscopy Technician    Social History Main Topics  . Smoking status: Never Smoker   . Smokeless tobacco: Never Used  . Alcohol Use: No     Comment: Sometimes  . Drug Use: No  . Sexual Activity: Yes   Other Topics Concern  . Not on file   Social History Narrative   Married   Education: College   Exercise: Yes    Review of Systems 13 point ROS reviewed. Positive for rhinorrhea, sinus  pressure, allergy symptoms, abdominal pain and flank pain. Otherwise negative  Objective:   Physical Exam  Constitutional: He is oriented to person, place, and time. He appears well-developed and well-nourished. No distress.  HENT:  Head: Normocephalic and atraumatic.  Eyes: Conjunctivae and EOM are normal.  Neck: Neck supple.  Cardiovascular: Normal rate.   Pulmonary/Chest: Effort normal.  Genitourinary:  One small hemorrhoid at 12 o'clock. no active bleeding. No thrombus.  Prostate: no nodules palpated.   Musculoskeletal: Normal range of  motion.  Neurological: He is alert and oriented to person, place, and time.  Skin: Skin is warm and dry.  Psychiatric: He has a normal mood and affect. His behavior is normal.  Nursing note and vitals reviewed.   Filed Vitals:   09/01/15 1430 09/01/15 1442  BP: 130/100 140/100  Pulse: 61   Temp: 98.1 F (36.7 C)   TempSrc: Oral   Resp: 16   Height:  (1.803 m)   Weight: 255 lb 9.6 oz (115.939 kg)   SpO2: 98%    Assessment & Plan:   NIKO PENSON is a 50 y.o. male Annual physical exam  -anticipatory guidance as below in AVS, screening labs above. Health maintenance items as above in HPI discussed/recommended as applicable.   LLQ abdominal pain - Plan: Ambulatory referral to Gastroenterology, PSA  -Chronic, recurrent, has had significant workup to this point without acute cause. We'll refer to second gastroenterologist for second opinion. RTC/ER precautions if worsening sooner. Discussed bowel regimen including MiraLAX to lessen constipation as this may be partial cause of his symptoms.  Elevated serum creatinine - Plan: COMPLETE METABOLIC PANEL WITH GFR  - Repeat CMP. Avoid nephrotoxins such as NSAIDs as much as possible.  Essential hypertension - Plan: hydrochlorothiazide (MICROZIDE) 12.5 MG capsule, Lipid panel, Nebivolol HCl (BYSTOLIC) 20 MG TABS, losartan (COZAAR) 100 MG tablet  -Decreased control, will add HCTZ 12.5 mg  daily, continue losartan and by bystolic at same doses. Recheck in 6 weeks.   Need for Tdap vaccination - Plan: Tdap vaccine greater than or equal to 7yo IM  Screening for hyperlipidemia  - Lipid panel  BRBPR (bright red blood per rectum) - Plan: Ambulatory referral to Gastroenterology  -Eval with GI as above.  Screening for prostate cancer - Plan: PSA  -We discussed pros and cons of prostate cancer screening, and after this discussion, he chose to have screening done. PSA obtained, and no concerning findings on DRE.   OSA (obstructive sleep apnea) - Plan: Ambulatory referral to Sleep Studies  -Has not been on CPAP for some time. We'll refer back for sleep studies to consider possible repeat sleep test versus restarting cpap.   Screening for HIV (human immunodeficiency virus) - Plan: HIV antibody  Gastroesophageal reflux disease, esophagitis presence not specified - Plan: omeprazole (PRILOSEC) 40 MG capsule  -Continue Prilosec 40 mg daily, eval with GI as above.  Meds ordered this encounter  Medications  . DISCONTD: omeprazole (PRILOSEC) 40 MG capsule    Sig: Take 40 mg by mouth daily.  . hydrochlorothiazide (MICROZIDE) 12.5 MG capsule    Sig: Take 1 capsule (12.5 mg total) by mouth daily.    Dispense:  30 capsule    Refill:  2  . Nebivolol HCl (BYSTOLIC) 20 MG TABS    Sig: Take 1 tablet (20 mg total) by mouth 2 (two) times daily.    Dispense:  180 tablet    Refill:  1  . losartan (COZAAR) 100 MG tablet    Sig: Take 1 tablet (100 mg total) by mouth daily.    Dispense:  30 tablet    Refill:  5  . omeprazole (PRILOSEC) 40 MG capsule    Sig: Take 1 capsule (40 mg total) by mouth daily.    Dispense:  90 capsule    Refill:  1   Patient Instructions  Continue colace once per day, miralax as needed to keep from becoming constipated. Drink plenty of fluids and fiber in the diet. I will refer  you to Dr. [name], but can also follow up with me to determine other workup if needed after  visit with Dr. [name]. Tylenol if needed.    Start new blood pressure medicine and follow up in next 6 weeks.   I will refer you to sleep studies to discuss sleep apnea repeat testing or treatment options.   Return to the clinic or go to the nearest emergency room if any of your symptoms worsen or new symptoms occur.  Keeping you healthy  Get these tests  Blood pressure- Have your blood pressure checked once a year by your healthcare provider.  Normal blood pressure is 120/80.  Weight- Have your body mass index (BMI) calculated to screen for obesity.  BMI is a measure of body fat based on height and weight. You can also calculate your own BMI at https://www.west-esparza.com/.  Cholesterol- Have your cholesterol checked regularly starting at age 13, sooner may be necessary if you have diabetes, high blood pressure, if a family member developed heart diseases at an early age or if you smoke.   Chlamydia, HIV, and other sexual transmitted disease- Get screened each year until the age of 46 then within three months of each new sexual partner.  Diabetes- Have your blood sugar checked regularly if you have high blood pressure, high cholesterol, a family history of diabetes or if you are overweight.  Get these vaccines  Flu shot- Every fall.  Tetanus shot- Every 10 years.  Menactra- Single dose; prevents meningitis.  Take these steps  Don't smoke- If you do smoke, ask your healthcare provider about quitting. For tips on how to quit, go to www.smokefree.gov or call 1-800-QUIT-NOW.  Be physically active- Exercise 5 days a week for at least 30 minutes.  If you are not already physically active start slow and gradually work up to 30 minutes of moderate physical activity.  Examples of moderate activity include walking briskly, mowing the yard, dancing, swimming bicycling, etc.  Eat a healthy diet- Eat a variety of healthy foods such as fruits, vegetables, low fat milk, low fat cheese, yogurt, lean  meats, poultry, fish, beans, tofu, etc.  For more information on healthy eating, go to www.thenutritionsource.org  Drink alcohol in moderation- Limit alcohol intake two drinks or less a day.  Never drink and drive.  Dentist- Brush and floss teeth twice daily; visit your dentis twice a year.  Depression-Your emotional health is as important as your physical health.  If you're feeling down, losing interest in things you normally enjoy please talk with your healthcare provider.  Gun Safety- If you keep a gun in your home, keep it unloaded and with the safety lock on.  Bullets should be stored separately.  Helmet use- Always wear a helmet when riding a motorcycle, bicycle, rollerblading or skateboarding.  Safe sex- If you may be exposed to a sexually transmitted infection, use a condom  Seat belts- Seat bels can save your life; always wear one.  Smoke/Carbon Monoxide detectors- These detectors need to be installed on the appropriate level of your home.  Replace batteries at least once a year.  Skin Cancer- When out in the sun, cover up and use sunscreen SPF 15 or higher.  Violence- If anyone is threatening or hurting you, please tell your healthcare provider.

## 2015-09-02 LAB — HIV ANTIBODY (ROUTINE TESTING W REFLEX): HIV: NONREACTIVE

## 2015-09-02 LAB — PSA: PSA: 0.37 ng/mL (ref <=4.00)

## 2015-09-11 ENCOUNTER — Encounter: Payer: Self-pay | Admitting: *Deleted

## 2015-10-04 ENCOUNTER — Encounter: Payer: Self-pay | Admitting: Neurology

## 2015-10-04 ENCOUNTER — Ambulatory Visit (INDEPENDENT_AMBULATORY_CARE_PROVIDER_SITE_OTHER): Payer: 59 | Admitting: Neurology

## 2015-10-04 VITALS — BP 152/108 | HR 68 | Resp 20 | Ht 72.0 in | Wt 249.0 lb

## 2015-10-04 DIAGNOSIS — G4710 Hypersomnia, unspecified: Secondary | ICD-10-CM | POA: Diagnosis not present

## 2015-10-04 DIAGNOSIS — G4730 Sleep apnea, unspecified: Secondary | ICD-10-CM | POA: Diagnosis not present

## 2015-10-04 DIAGNOSIS — R351 Nocturia: Secondary | ICD-10-CM | POA: Diagnosis not present

## 2015-10-04 DIAGNOSIS — G4726 Circadian rhythm sleep disorder, shift work type: Secondary | ICD-10-CM

## 2015-10-04 DIAGNOSIS — R0683 Snoring: Secondary | ICD-10-CM

## 2015-10-04 NOTE — Patient Instructions (Signed)
Obesity Obesity is having too much body fat and a body mass index (BMI) of 30 or more. BMI is a number that is based on your height and weight. BMI is usually figured out by your doctor during regular wellness visits. The number is an estimate of how much body fat you have. Obesity can happen if you eat more calories than you can burn with exercise or other activity. It can cause major health problems or emergencies.  HOME CARE  Exercise and be active as told by your doctor. Try:  Using stairs when you can.  Parking farther away from store doors.  Gardening, biking, or walking.  Eat healthy foods and drinks that are low in calories. Eat more fruits and vegetables.  Limit fast food, sweets, and snack foods that are made with ingredients that are not natural (processed food).  Eat smaller amounts of food.  Keep a journal and write down what you eat every day. Websites can help with this.  Avoid drinking alcohol. Drink more water and drinks that have no calories.  Take vitamins and dietary pills (supplements) only as told by your doctor.  Try going to weight-loss support groups or classes. These can help to lessen stress. Dietitians and counselors may also help. GET HELP RIGHT AWAY IF:  You have pain or tightness in your chest.  You have trouble breathing or feel short of breath.  You feel weak or have loss of feeling (numbness) in your legs.  You feel confused or have trouble talking.  You have sudden changes in your vision.   This information is not intended to replace advice given to you by your health care provider. Make sure you discuss any questions you have with your health care provider.   Document Released: 08/28/2011 Document Revised: 06/26/2014 Document Reviewed: 08/28/2011 Elsevier Interactive Patient Education 2016 [employer]  

## 2015-10-04 NOTE — Progress Notes (Signed)
SLEEP MEDICINE CLINIC   Provider:  Melvyn Novas, M D  Referring Provider: Jonita Albee, MD Primary Care Physician:  Tally Due, MD  Chief Complaint  Patient presents with  . New Patient (Initial Visit)    has had a sleep study but doesn't remember where or when, snores at night,, was on cpap long time ago, rm 11, alone    HPI:  Raymond Green is a 50 y.o. male , seen here as a referral from Dr Neva Seat, from [facility].  Chief complaint according to patient : Raymond Green reports that about 10 years ago he had a sleep study at the [facility] on [facility], He has recently seen several of sleep [facility] doctors including Dr. Perrin Maltese, and Dr. Merla Riches. Last year he underwent an evaluation for abdominal pain with Dr. Madilyn Fireman. A cause of pain was never identified. He has been evaluated for rectal bleeding by Dr. Merla Riches. Lower left quadrant pain. He asked specifically for second opinion from Dr. Darlen Round. He has a chronically slight elevated creatinine level, he had a colonoscopy in April 2016 with a single 4 mm polyp removal. PSA was normal range, depression score was 0, he is up to date with all his vaccinations.  He has a history of sleep apnea diagnosed about 10 years ago but stopped using CPAP about 5 years ago he never liked sleeping with the mask very much. His wife has remarked upon his tendency to still snore loudly and to stop to breathe at night. He sometimes wakes up with a headache. He does not report waking up with a dry mouth.  Sleep habits are as follows: He usually goes to bed between 11:30 and 12 midnight, but has to rise again at 5:30 AM. He has nocturia about 3 times at night, but reports no trouble going back to sleep. He usually falls asleep on his side but when he wakes up he finds himself in the supine reposition. He sleeps on one pillow only. His bedroom is dark, cool and quiet.  He shares a bedroom with his wife. There are no pets in the household, no children. 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 His usual work hours are from 7 AM to 5:30 PM. He often falls asleep watching TV after returning home from work, but he does not schedule any naps. He has noted that he gets very drowsy during his lunch breaks. There is no possibility for him to use his lunch break for nap however. While  he wakes sometimes with a headache he has never been woken by headaches. He feels chronically sleep deprived. He tried to go to bed at 10 PM he won't sleep through the night.   Sleep medical history and family sleep history:  Father has been a loud snorer, In childhood he was snoring.   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    Review of Systems: Out of a complete 14 system review, the patient complains of only the following symptoms, and all other reviewed systems are negative. Shift worker , snoring, headaches,  Nocturia. Fatigue.   Epworth score  13, Fatigue severity score n/a   , depression score 2   Social History   Social History  . Marital Status: Married    Spouse Name: N/A  . Number of Children:  N/A  . Years of Education: N/A   Occupational History  . Endoscopy Technician    Social History Main Topics  . Smoking status: Never Smoker   . Smokeless tobacco: Never Used  . Alcohol Use: No     Comment: Sometimes  . Drug Use: No  . Sexual Activity: Yes   Other Topics Concern  . Not on file   Social History Narrative   Married   Education: College   Exercise: Yes    Family History  Problem Relation Age of Onset  . Diabetes Mother   . Heart disease Mother   . Hypertension Mother   . Hypertension Father   . Diabetes Sister     Past Medical History  Diagnosis Date  . Hypertension   . OSA (obstructive sleep apnea)   . Anemia   . Positive PPD,  treated 2000    INH  . Obesity   . Hemorrhoids   . LVH (left ventricular hypertrophy)     Dr. Donnie Aho ( cardiology)  . Kidney insufficiency   . Vitamin D deficiency   . Headache(784.0)   . Allergy     Past Surgical History  Procedure Laterality Date  . Hemorrhoid surgery    . Colonoscopy  12/28/2011    Procedure: COLONOSCOPY;  Surgeon: [name], MD;  Location: [facility];  Service: Endoscopy;  Laterality: N/A;    Current Outpatient Prescriptions  Medication Sig Dispense Refill  . docusate sodium (COLACE) 100 MG capsule Take 100 mg by mouth daily.    . hydrochlorothiazide (MICROZIDE) 12.5 MG capsule Take 1 capsule (12.5 mg total) by mouth daily. 30 capsule 2  . losartan (COZAAR) 100 MG tablet Take 1 tablet (100 mg total) by mouth daily. 30 tablet 5  . Nebivolol HCl (BYSTOLIC) 20 MG TABS Take 1 tablet (20 mg total) by mouth 2 (two) times daily. 180 tablet 1  . omeprazole (PRILOSEC) 40 MG capsule Take 1 capsule (40 mg total) by mouth daily. 90 capsule 1   No current facility-administered medications for this visit.    Allergies as of 10/04/2015 - Review Complete 10/04/2015  Allergen Reaction Noted  . Lisinopril  08/25/2011    Vitals: BP 152/108 mmHg  Pulse 68  Resp 20  Ht 6' (1.829 m)  Wt 249 lb (112.946 kg)  BMI 33.76 kg/m2 Last Weight:  Wt Readings from Last 1 Encounters:  10/04/15 249 lb (112.946 kg)   WUJ:WJXB mass index is 33.76 kg/(m^2).     Last Height:   Ht Readings from Last 1 Encounters:  10/04/15 6' (1.829 m)    Physical exam:  General: The patient is awake, alert and appears not in acute distress. The patient is well groomed. Head: Normocephalic, atraumatic. Neck is supple. Mallampati  5,  neck circumference:17. Nasal airflow- right nasion is obstructed,site patent for airflow on the left , TMJ is not  evident . Retrognathia is seen.  Cardiovascular:  Regular rate and rhythm, without  murmurs or carotid bruit, and without distended neck  veins. Respiratory: Lungs are clear to auscultation. Skin:  Without evidence of edema, or rash Trunk: BMI is elevated . The patient's posture is erect .  Neurologic exam : The patient is awake and alert, oriented to place and time.   Memory subjective described as intact.  Attention span & concentration ability appears normal.  Speech is fluent,  without  dysarthria, dysphonia or aphasia.  Mood and affect are appropriate.  Cranial nerves: Pupils are equal and briskly reactive  to light. Funduscopic exam without  evidence of pallor or edema. Extraocular movements  in vertical and horizontal planes intact and without nystagmus. Visual fields by finger perimetry are intact. Hearing to finger rub intact.   Facial sensation intact to fine touch.  Facial motor strength is symmetric and tongue and uvula move midline. Shoulder shrug was symmetrical.   Motor exam: Normal tone, muscle bulk and symmetric strength in all extremities.  Sensory:  Fine touch, pinprick and vibration were tested in all extremities.  Proprioception tested in the upper extremities was normal.  Coordination: Rapid alternating movements in the fingers/hands was normal. Finger-to-nose maneuver  normal without evidence of ataxia, dysmetria or tremor.  Gait and station: Patient walks without assistive device and is able unassisted to climb up to the exam table. Strength within normal limits.  Stance is stable and normal.  Deep tendon reflexes: in the  upper and lower extremities are symmetric and intact. Babinski maneuver response is  downgoing.  The patient was advised of the nature of the diagnosed sleep disorder , the treatment options and risks for general a health and wellness arising from not treating the condition.  I spent more than 45 minutes of face to face time with the patient. Greater than 50% of time was spent in counseling and coordination of care. We have discussed the diagnosis and differential and I answered  the patient's questions.     Assessment:  After physical and neurologic examination, review of laboratory studies,  Personal review of imaging studies, reports of other /same  Imaging studies ,  Results of polysomnography/ neurophysiology testing and pre-existing records as far as provided in visit., my assessment is   My patients spouse has witnessed apneas and snoring to be an ongoing problem. But it seems to be less severe when he sleeps on his side he invariably will turn at one time during the night into the supine sleep position and snoring and apnea resume.  1) Raymond Green have an elevated body mass index, but is not morbidly obese. He is muscular as well. He has a high-grade Mallampati and his uvula is not easily visible. He also has retrognathia. All these conditions are contributing to a higher risk of having obstructive sleep apnea just as he was diagnosed about 10 years ago.  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 This contributes to poor sleep consistency when waking up at night by working day shifts. He has trouble to initiate a sustainable sleep earlier than 11 PM -and the remaining 6 hour of possible sleep time is still fragmented by bathroom breaks.  3) nocturia.    Plan:  Treatment plan and additional workup : Raymond Green needs to undergo a split night polysomnography, we should try to see sleep in supine. To improve nocturnal sleep, I recommend use of melatonin , less than 5 mg OTC, and to begin his bedtime at 11 Pm. Gradually advancing the bed time by 30 minutes week after week may allow him to sleep longer.  CPAP will be fitted and I prefer a nasal pillow for a patient with retrognahia.  Please remember to try to maintain good sleep hygiene, which means: Keep a regular sleep and wake schedule, try not to exercise or have a meal within 2 hours of your bedtime, try to keep your bedroom conducive for sleep, that  is, cool and dark, without light distractors such as an illuminated alarm clock, and  refrain from watching TV right before sleep or in the middle of the night and do not keep the TV or radio on during the night. Also, try not to use or play on electronic devices at bedtime, such as your cell phone, tablet PC or laptop.  If you like to read at bedtime on an electronic device, try to dim the background light as much as possible. Do not eat in the middle of the night.   We will request a sleep study.    We will look for leg twitching and snoring / sleep apnea.   We will call you with the sleep study results and make a follow up appointment if needed.       Porfirio Mylar Natalija Mavis MD  10/04/2015   CC: Dr. [name]

## 2015-10-13 ENCOUNTER — Ambulatory Visit: Payer: 59 | Admitting: Family Medicine

## 2015-10-19 ENCOUNTER — Ambulatory Visit (INDEPENDENT_AMBULATORY_CARE_PROVIDER_SITE_OTHER): Payer: 59 | Admitting: Family Medicine

## 2015-10-19 VITALS — BP 136/94 | HR 60 | Temp 98.5°F | Resp 18 | Wt 252.0 lb

## 2015-10-19 DIAGNOSIS — M62838 Other muscle spasm: Secondary | ICD-10-CM

## 2015-10-19 DIAGNOSIS — M6248 Contracture of muscle, other site: Secondary | ICD-10-CM

## 2015-10-19 DIAGNOSIS — M542 Cervicalgia: Secondary | ICD-10-CM

## 2015-10-19 DIAGNOSIS — I10 Essential (primary) hypertension: Secondary | ICD-10-CM | POA: Diagnosis not present

## 2015-10-19 MED ORDER — HYDROCHLOROTHIAZIDE 12.5 MG PO CAPS: 12.5000 mg | ORAL_CAPSULE | Freq: Every day | ORAL | Status: DC

## 2015-10-19 MED ORDER — CYCLOBENZAPRINE HCL 5 MG PO TABS: ORAL_TABLET | ORAL | Status: DC

## 2015-10-19 NOTE — Patient Instructions (Addendum)
     IF you received an x-ray today, you will [facility] an [facility]. Please contact [facility] at [phone] with questions or concerns regarding your invoice.   IF you received labwork today, you will receive an invoice from [facility]. Please contact [facility] at [phone] with questions or concerns regarding your invoice.   Our billing staff will not be able to assist you with questions regarding bills from these companies.  You will be contacted with the lab results as soon as they are available. The fastest way to get your results is to activate your My Chart account. Instructions are located on the last page of this paperwork. If you have not heard from us regarding the results in 2 weeks, please contact this office.    You likely have a pinched nerve or muscle spasm in the neck. As there is no bony tenderness, we can hold off on x-rays today. Try Tylenol over-the-counter, heat or ice, and gentle range of motion throughout the day, and Flexeril up to every 8 hours. This medicine does cause sedation, see may want to start with it at night. If not improving in the next 1 week, or worsening sooner, return for recheck.  Blood pressures improved today, but still slightly elevated on lower number. Keep a record of your blood pressures outside of the office and if remaining over 140/90, return to discuss further changes. Otherwise follow-up with me in the next 3 months to recheck blood pressure and kidney function.   Return to the clinic or go to the nearest emergency room if any of your symptoms worsen or new symptoms occur.

## 2015-10-19 NOTE — Progress Notes (Signed)
Subjective:    Patient ID: Raymond Green, male    DOB: 19-Nov-1965, 50 y.o.   MRN: [id number]  HPI Chief Complaint  Patient presents with  . Neck Pain    HPI Comments: Raymond Green is a 50 y.o. male who [facility] complaining of right neck pain that began in the morning 5 days ago. The pain is located in top frontal part of his right shoulder. He mentions the pain escalating a day ago. He doesn't know where the pain came from. Pt reports going to the gym in the afternoon 5 days ago. He denies taking any thing to relieve his pain.  He mentions having chronic bloating.  He's here with neck pain. I saw him for physical March 15th. I've noted he had some chronic abdominal pain. With hx of BRBPR but does take PPI QD.  HTN: Has hx of HTN with decreased control last visit and advisted 6 weeks after adding HCTZ 12 mg QD. Additionally has a slightly elevated creatinine that appears overall stable; in March.  Filed Vitals:   10/19/15 1351  BP: 136/94  Pulse: 60  Temp: 98.5 F (36.9 C)  TempSrc: Oral  Resp: 18  Weight: 252 lb (114.306 kg)  SpO2: 100%   Patient Active Problem List   Diagnosis Date Noted  . Elevated serum creatinine 05/29/2015  . Gout 10/29/2013  . HYPERTENSION 03/02/2008  . GERD 03/02/2008  . ANEMIA-IRON DEFICIENCY 12/13/2007  . HEMORRHOIDS-EXTERNAL 12/13/2007   Past Medical History  Diagnosis Date  . Hypertension   . OSA (obstructive sleep apnea)   . Anemia   . Positive PPD, treated 2000    INH  . Obesity   . Hemorrhoids   . LVH (left ventricular hypertrophy)     Dr. Donnie Aho ( cardiology)  . Kidney insufficiency   . Vitamin D deficiency   . Headache(784.0)   . Allergy    Past Surgical History  Procedure Laterality Date  . Hemorrhoid surgery    . Colonoscopy  12/28/2011    Procedure: COLONOSCOPY;  Surgeon: [name], MD;  Location: [facility];  Service: Endoscopy;  Laterality: N/A;   Allergies    Allergen Reactions  . Lisinopril     angioedema   Prior to Admission medications   Medication Sig Start Date End Date Taking? Authorizing Provider  docusate sodium (COLACE) 100 MG capsule Take 100 mg by mouth daily.   Yes Historical Provider, MD  hydrochlorothiazide (MICROZIDE) 12.5 MG capsule Take 1 capsule (12.5 mg total) by mouth daily. 09/01/15  Yes Shade Flood, MD  losartan (COZAAR) 100 MG tablet Take 1 tablet (100 mg total) by mouth daily. 09/01/15  Yes Shade Flood, MD  Nebivolol HCl (BYSTOLIC) 20 MG TABS Take 1 tablet (20 mg total) by mouth 2 (two) times daily. 09/01/15  Yes Shade Flood, MD  omeprazole (PRILOSEC) 40 MG capsule Take 1 capsule (40 mg total) by mouth daily. 09/01/15  Yes Shade Flood, MD   Social History   Social History  . Marital Status: Married    Spouse Name: N/A  . Number of Children: N/A  . Years of Education: N/A   Occupational History  . Endoscopy Technician    Social History Main Topics  . Smoking status: Never Smoker   . Smokeless tobacco: Never Used  . Alcohol Use: No     Comment: Sometimes  . Drug Use: No  . Sexual Activity: Yes  Other Topics Concern  . Not on file   Social History Narrative   Married   Education: College   Exercise: Yes   Review of Systems  Constitutional: Negative for fatigue and unexpected weight change.  Eyes: Negative for visual disturbance.  Respiratory: Negative for cough, chest tightness and shortness of breath.   Cardiovascular: Negative for chest pain, palpitations and leg swelling.  Gastrointestinal: Positive for abdominal pain (chronic abdominal pain). Negative for blood in stool.  Neurological: Negative for dizziness, light-headedness and headaches.    Objective:   Physical Exam  Constitutional: He is oriented to person, place, and time. He appears well-developed and well-nourished.  HENT:  Head: Normocephalic and atraumatic.  Eyes: EOM are normal. Pupils are equal, round, and reactive  to light.  Neck: No JVD present. Carotid bruit is not present.  Tender along right paraspinal muscles into the right trapezius with spasm Minimal tenderness in the left paraspinal muscle No midline bony tenderness in the cervical spine C-Spine ROM with guarding with flexion and extention. Minimal right rotation, aprrox 60 degrees of left rotation. Slight decreased right lateral flexion  Upper strength equal. Right shoulder FROM Full RTC strength  Cardiovascular: Normal rate, regular rhythm and normal heart sounds.   No murmur heard. Pulmonary/Chest: Effort normal and breath sounds normal. He has no rales.  Musculoskeletal: He exhibits no edema.  Neurological: He is alert and oriented to person, place, and time.  Reflex Scores:      Tricep reflexes are 1+ on the right side and 1+ on the left side.      Bicep reflexes are 2+ on the right side and 2+ on the left side.      Brachioradialis reflexes are 2+ on the right side and 2+ on the left side. Reflexes are equal with 2+ at the biceps  Skin: Skin is warm and dry.  Psychiatric: He has a normal mood and affect.  Vitals reviewed.   Assessment & Plan:   Raymond Green is a 50 y.o. male Neck pain on right side - Plan: cyclobenzaprine (FLEXERIL) 5 MG tablet Muscle spasms of neck - Plan: cyclobenzaprine (FLEXERIL) 5 MG tablet  - Suspected neuronal impingement with secondary spasm and cervical spine. Strength intact, reflexes normal/equal. Trial of heat or ice, Flexeril up to every 8 hours, but side effects discussed, range of motion, and RTC precautions.  Essential hypertension - Plan: hydrochlorothiazide (MICROZIDE) 12.5 MG capsule  -Borderline, but overall improved readings. Continue same regimen for now, check outside readings, and RTC precautions if persistently elevated.   Meds ordered this encounter  Medications  . cyclobenzaprine (FLEXERIL) 5 MG tablet    Sig: 1 pill by mouth up to every 8 hours as needed. Start with one pill  by mouth each bedtime as needed due to sedation    Dispense:  15 tablet    Refill:  0  . hydrochlorothiazide (MICROZIDE) 12.5 MG capsule    Sig: Take 1 capsule (12.5 mg total) by mouth daily.    Dispense:  90 capsule    Refill:  1   Patient Instructions       IF you received an x-ray today, you will receive an invoice from [facility]. Please contact [facility] at [phone] with questions or concerns regarding your invoice.   IF you received labwork today, you will receive an invoice from [facility]. Please contact [facility] at [phone] with questions or concerns regarding your invoice.   Our billing staff will not be  able to assist you with questions regarding bills from these companies.  You will be contacted with the lab results as soon as they are available. The fastest way to get your results is to activate your My Chart account. Instructions are located on the last page of this paperwork. If you have not heard from us regarding the results in 2 weeks, please contact this office.    You likely have a pinched nerve or muscle spasm in the neck. As there is no bony tenderness, we can hold off on x-rays today. Try Tylenol over-the-counter, heat or ice, and gentle range of motion throughout the day, and Flexeril up to every 8 hours. This medicine does cause sedation, see may want to start with it at night. If not improving in the next 1 week, or worsening sooner, return for recheck.  Blood pressures improved today, but still slightly elevated on lower number. Keep a record of your blood pressures outside of the office and if remaining over 140/90, return to discuss further changes. Otherwise follow-up with me in the next 3 months to recheck blood pressure and kidney function.   Return to the clinic or go to the nearest emergency room if any of your symptoms worsen or new symptoms occur.     I personally performed the services  described in this documentation, which was scribed in my presence. The recorded information has been reviewed and considered, and addended by me as needed.

## 2015-11-01 ENCOUNTER — Other Ambulatory Visit: Payer: Self-pay | Admitting: Family Medicine

## 2015-11-01 ENCOUNTER — Encounter: Payer: Self-pay | Admitting: Radiology

## 2015-11-01 ENCOUNTER — Ambulatory Visit (INDEPENDENT_AMBULATORY_CARE_PROVIDER_SITE_OTHER): Payer: 59 | Admitting: Family Medicine

## 2015-11-01 VITALS — BP 142/90 | HR 59 | Temp 98.5°F | Resp 16 | Ht 71.0 in | Wt 250.0 lb

## 2015-11-01 DIAGNOSIS — M109 Gout, unspecified: Secondary | ICD-10-CM

## 2015-11-01 LAB — COMPLETE METABOLIC PANEL WITH GFR
ALT: 19 U/L (ref 9–46)
AST: 25 U/L (ref 10–40)
Albumin: 4.6 g/dL (ref 3.6–5.1)
Alkaline Phosphatase: 56 U/L (ref 40–115)
BILIRUBIN TOTAL: 0.7 mg/dL (ref 0.2–1.2)
BUN: 14 mg/dL (ref 7–25)
CHLORIDE: 101 mmol/L (ref 98–110)
CO2: 29 mmol/L (ref 20–31)
CREATININE: 1.62 mg/dL — AB (ref 0.60–1.35)
Calcium: 10.4 mg/dL — ABNORMAL HIGH (ref 8.6–10.3)
GFR, Est African American: 57 mL/min — ABNORMAL LOW (ref >=60)
GFR, Est Non African American: 49 mL/min — ABNORMAL LOW (ref >=60)
GLUCOSE: 92 mg/dL (ref 65–99)
Potassium: 4.7 mmol/L (ref 3.5–5.3)
SODIUM: 139 mmol/L (ref 135–146)
TOTAL PROTEIN: 8 g/dL (ref 6.1–8.1)

## 2015-11-01 LAB — URIC ACID: Uric Acid, Serum: 8.4 mg/dL — ABNORMAL HIGH (ref 4.0–7.8)

## 2015-11-01 MED ORDER — PREDNISONE 20 MG PO TABS: ORAL_TABLET | ORAL | Status: DC

## 2015-11-01 MED ORDER — ALLOPURINOL 100 MG PO TABS: 100.0000 mg | ORAL_TABLET | Freq: Every day | ORAL | Status: DC

## 2015-11-01 MED ORDER — COLCHICINE 0.6 MG PO TABS: ORAL_TABLET | ORAL | Status: DC

## 2015-11-01 MED FILL — COLCHICINE 0.6 MG TABLET: 0.6 | 20 days supply | Qty: 20 | Fill #0

## 2015-11-01 MED FILL — LOSARTAN POTASSIUM 100 MG T: 100 | 90 days supply | Qty: 90 | Fill #0

## 2015-11-01 MED FILL — predniSONE 20 MG TABS: 20 | 6 days supply | Qty: 12 | Fill #0

## 2015-11-01 NOTE — Progress Notes (Signed)
Patient ID: Raymond Green, Raymond Green    DOB: [birth year]  Age: 50 y.o. MRN: [id number]  Chief Complaint  Patient presents with  . 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 He has had several hours of gout in the past, not on any chronic medication for this. Starting Friday he had pain in his left great toe. He is in here for that today. He is scheduled to work Advertising account executive. He does not eat shellfish since he is allergic to it. He is not a beer drinker.  Current allergies, medications, problem list, past/family and social histories reviewed.  Objective:  BP 142/90 mmHg  Pulse 59  Temp(Src) 98.5 F (36.9 C) (Oral)  Resp 16  Ht 5\' 11"  (1.803 m)  Wt 250 lb (113.399 kg)  BMI 34.88 kg/m2  SpO2 99%  No major acute distress. Reviewed his old records and his recent kidney functions were good. He does walk with a limp, and is very tender in his left great toe. It is hot to touch. The MTP joint is swollen.  Assessment & Plan:   Assessment: 1. Acute gouty arthritis       Plan: See instructions.  Orders Placed This Encounter  Procedures  . COMPLETE METABOLIC PANEL WITH GFR  . Uric acid    Meds ordered this encounter  Medications  . colchicine 0.6 MG tablet    Sig: Take 2 initially, then one tonight, then one daily until gout improved    Dispense:  20 tablet    Refill:  1  . predniSONE (DELTASONE) 20 MG tablet    Sig: Take 3 pills today and tomorrow, then 2 daily for 2 days, then 1 daily for 2 days    Dispense:  12 tablet    Refill:  0         Patient Instructions   We will let you know the results of your labs in a few days  Take prednisone 3 pills daily for 2 days, then 2 daily for 2 days, then 1 daily for 2 days take the initial pills today, then take each day after breakfast.  Take colchicine 2 pills initially now, then one tonight, and 1 daily thereafter. This may make your stools a little loose. You can  discontinue these within the painful flare of gout has resolved.  Read the article below on gout. You can look online to find a list of foods that are known to worsen gout. However, shellfish such as shrimp and oysters and clams seem to be one of the worst things.  If the uric acid comes back extremely high, I will be recommending that you begin taking allopurinol 1 daily to try and keep the uric acid lower.  Weight loss would help  Gout Gout is an inflammatory arthritis caused by a buildup of uric acid crystals in the joints. Uric acid is a chemical that is normally present in the blood. When the level of uric acid in the blood is too high it can form crystals that deposit in your joints and tissues. This causes joint redness, soreness, and swelling (inflammation). Repeat attacks are common. Over time, uric acid crystals can form into masses (tophi) near a joint, destroying bone and causing disfigurement. Gout is treatable and often preventable. CAUSES  The disease begins with elevated levels of uric acid in the blood. Uric acid is produced by your body when it breaks  down a naturally found substance called purines. Certain foods you eat, such as meats and fish, contain high amounts of purines. Causes of an elevated uric acid level include:  Being passed down from parent to child (heredity).  Diseases that cause increased uric acid production (such as obesity, psoriasis, and certain cancers).  Excessive alcohol use.  Diet, especially diets rich in meat and seafood.  Medicines, including certain cancer-fighting medicines (chemotherapy), water pills (diuretics), and aspirin.  Chronic kidney disease. The kidneys are no longer able to remove uric acid well.  Problems with metabolism. Conditions strongly associated with gout include:  Obesity.  High blood pressure.  High cholesterol.  Diabetes. Not everyone with elevated uric acid levels gets gout. It is not understood why some people  get gout and others do not. Surgery, joint injury, and eating too much of certain foods are some of the factors that can lead to gout attacks. SYMPTOMS   An attack of gout comes on quickly. It causes intense pain with redness, swelling, and warmth in a joint.  Fever can occur.  Often, only one joint is involved. Certain joints are more commonly involved:  Base of the big toe.  Knee.  Ankle.  Wrist.  Finger. Without treatment, an attack usually goes away in a few days to weeks. Between attacks, you usually will not have symptoms, which is different from many other forms of arthritis. DIAGNOSIS  Your caregiver will suspect gout based on your symptoms and exam. In some cases, tests may be recommended. The tests may include:  Blood tests.  Urine tests.  X-rays.  Joint fluid exam. This exam requires a needle to remove fluid from the joint (arthrocentesis). Using a microscope, gout is confirmed when uric acid crystals are seen in the joint fluid. TREATMENT  There are two phases to gout treatment: treating the sudden onset (acute) attack and preventing attacks (prophylaxis).  Treatment of an Acute Attack.  Medicines are used. These include anti-inflammatory medicines or steroid medicines.  An injection of steroid medicine into the affected joint is sometimes necessary.  The painful joint is rested. Movement can worsen the arthritis.  You may use warm or cold treatments on painful joints, depending which works best for you.  Treatment to Prevent Attacks.  If you suffer from frequent gout attacks, your caregiver may advise preventive medicine. These medicines are started after the acute attack subsides. These medicines either help your kidneys eliminate uric acid from your body or decrease your uric acid production. You may need to stay on these medicines for a very long time.  The early phase of treatment with preventive medicine can be associated with an increase in acute gout  attacks. For this reason, during the first few months of treatment, your caregiver may also advise you to take medicines usually used for acute gout treatment. Be sure you understand your caregiver's directions. Your caregiver may make several adjustments to your medicine dose before these medicines are effective.  Discuss dietary treatment with your caregiver or dietitian. Alcohol and drinks high in sugar and fructose and foods such as meat, poultry, and seafood can increase uric acid levels. Your caregiver or dietitian can advise you on drinks and foods that should be limited. HOME CARE INSTRUCTIONS   Do not take aspirin to relieve pain. This raises uric acid levels.  Only take over-the-counter or prescription medicines for pain, discomfort, or fever as directed by your caregiver.  Rest the joint as much as possible. When in bed, keep sheets  and blankets off painful areas.  Keep the affected joint raised (elevated).  Apply warm or cold treatments to painful joints. Use of warm or cold treatments depends on which works best for you.  Use crutches if the painful joint is in your leg.  Drink enough fluids to keep your urine clear or pale yellow. This helps your body get rid of uric acid. Limit alcohol, sugary drinks, and fructose drinks.  Follow your dietary instructions. Pay careful attention to the amount of protein you eat. Your daily diet should emphasize fruits, vegetables, whole grains, and fat-free or low-fat milk products. Discuss the use of coffee, vitamin C, and cherries with your caregiver or dietitian. These may be helpful in lowering uric acid levels.  Maintain a healthy body weight. SEEK MEDICAL CARE IF:   You develop diarrhea, vomiting, or any side effects from medicines.  You do not feel better in 24 hours, or you are getting worse. SEEK IMMEDIATE MEDICAL CARE IF:   Your joint becomes suddenly more tender, and you have chills or a fever. MAKE SURE YOU:   Understand  these instructions.  Will watch your condition.  Will get help right away if you are not doing well or get worse.   This information is not intended to replace advice given to you by your health care provider. Make sure you discuss any questions you have with your health care provider.   Document Released: 06/02/2000 Document Revised: 06/26/2014 Document Reviewed: 01/17/2012 Elsevier Interactive Patient Education 2016 [employer].     IF you received an x-ray today, you will receive an invoice from [facility]. Please contact [facility] at [phone] with questions or concerns regarding your invoice.   IF you received labwork today, you will receive an invoice from [facility]. Please contact [facility] at [phone] with questions or concerns regarding your invoice.   Our billing staff will not be able to assist you with questions regarding bills from these companies.  You will be contacted with the lab results as soon as they are available. The fastest way to get your results is to activate your My Chart account. Instructions are located on the last page of this paperwork. If you have not heard from [place] regarding the results in 2 weeks, please contact this office.          Return in about 3 weeks (around 11/22/2015).   HOPPER,DAVID, MD 11/01/2015

## 2015-11-01 NOTE — Patient Instructions (Addendum)
We will let you know the results of your labs in a few days  Take prednisone 3 pills daily for 2 days, then 2 daily for 2 days, then 1 daily for 2 days take the initial pills today, then take each day after breakfast.  Take colchicine 2 pills initially now, then one tonight, and 1 daily thereafter. This may make your stools a little loose. You can discontinue these within the painful flare of gout has resolved.  Read the article below on gout. You can look online to find a list of foods that are known to worsen gout. However, shellfish such as shrimp and oysters and clams seem to be one of the worst things.  If the uric acid comes back extremely high, I will be recommending that you begin taking allopurinol 1 daily to try and keep the uric acid lower.  Weight loss would help  Gout Gout is an inflammatory arthritis caused by a buildup of uric acid crystals in the joints. Uric acid is a chemical that is normally present in the blood. When the level of uric acid in the blood is too high it can form crystals that deposit in your joints and tissues. This causes joint redness, soreness, and swelling (inflammation). Repeat attacks are common. Over time, uric acid crystals can form into masses (tophi) near a joint, destroying bone and causing disfigurement. Gout is treatable and often preventable. CAUSES  The disease begins with elevated levels of uric acid in the blood. Uric acid is produced by your body when it breaks down a naturally found substance called purines. Certain foods you eat, such as meats and fish, contain high amounts of purines. Causes of an elevated uric acid level include:  Being passed down from parent to child (heredity).  Diseases that cause increased uric acid production (such as obesity, psoriasis, and certain cancers).  Excessive alcohol use.  Diet, especially diets rich in meat and seafood.  Medicines, including certain cancer-fighting medicines (chemotherapy), water pills  (diuretics), and aspirin.  Chronic kidney disease. The kidneys are no longer able to remove uric acid well.  Problems with metabolism. Conditions strongly associated with gout include:  Obesity.  High blood pressure.  High cholesterol.  Diabetes. Not everyone with elevated uric acid levels gets gout. It is not understood why some people get gout and others do not. Surgery, joint injury, and eating too much of certain foods are some of the factors that can lead to gout attacks. SYMPTOMS   An attack of gout comes on quickly. It causes intense pain with redness, swelling, and warmth in a joint.  Fever can occur.  Often, only one joint is involved. Certain joints are more commonly involved:  Base of the big toe.  Knee.  Ankle.  Wrist.  Finger. Without treatment, an attack usually goes away in a few days to weeks. Between attacks, you usually will not have symptoms, which is different from many other forms of arthritis. DIAGNOSIS  Your caregiver will suspect gout based on your symptoms and exam. In some cases, tests may be recommended. The tests may include:  Blood tests.  Urine tests.  X-rays.  Joint fluid exam. This exam requires a needle to remove fluid from the joint (arthrocentesis). Using a microscope, gout is confirmed when uric acid crystals are seen in the joint fluid. TREATMENT  There are two phases to gout treatment: treating the sudden onset (acute) attack and preventing attacks (prophylaxis).  Treatment of an Acute Attack.  Medicines are used.  These include anti-inflammatory medicines or steroid medicines.  An injection of steroid medicine into the affected joint is sometimes necessary.  The painful joint is rested. Movement can worsen the arthritis.  You may use warm or cold treatments on painful joints, depending which works best for you.  Treatment to Prevent Attacks.  If you suffer from frequent gout attacks, your caregiver may advise preventive  medicine. These medicines are started after the acute attack subsides. These medicines either help your kidneys eliminate uric acid from your body or decrease your uric acid production. You may need to stay on these medicines for a very long time.  The early phase of treatment with preventive medicine can be associated with an increase in acute gout attacks. For this reason, during the first few months of treatment, your caregiver may also advise you to take medicines usually used for acute gout treatment. Be sure you understand your caregiver's directions. Your caregiver may make several adjustments to your medicine dose before these medicines are effective.  Discuss dietary treatment with your caregiver or dietitian. Alcohol and drinks high in sugar and fructose and foods such as meat, poultry, and seafood can increase uric acid levels. Your caregiver or dietitian can advise you on drinks and foods that should be limited. HOME CARE INSTRUCTIONS   Do not take aspirin to relieve pain. This raises uric acid levels.  Only take over-the-counter or prescription medicines for pain, discomfort, or fever as directed by your caregiver.  Rest the joint as much as possible. When in bed, keep sheets and blankets off painful areas.  Keep the affected joint raised (elevated).  Apply warm or cold treatments to painful joints. Use of warm or cold treatments depends on which works best for you.  Use crutches if the painful joint is in your leg.  Drink enough fluids to keep your urine clear or pale yellow. This helps your body get rid of uric acid. Limit alcohol, sugary drinks, and fructose drinks.  Follow your dietary instructions. Pay careful attention to the amount of protein you eat. Your daily diet should emphasize fruits, vegetables, whole grains, and fat-free or low-fat milk products. Discuss the use of coffee, vitamin C, and cherries with your caregiver or dietitian. These may be helpful in lowering uric  acid levels.  Maintain a healthy body weight. SEEK MEDICAL CARE IF:   You develop diarrhea, vomiting, or any side effects from medicines.  You do not feel better in 24 hours, or you are getting worse. SEEK IMMEDIATE MEDICAL CARE IF:   Your joint becomes suddenly more tender, and you have chills or a fever. MAKE SURE YOU:   Understand these instructions.  Will watch your condition.  Will get help right away if you are not doing well or get worse.   This information is not intended to replace advice given to you by your health care provider. Make sure you discuss any questions you have with your health care provider.   Document Released: 06/02/2000 Document Revised: 06/26/2014 Document Reviewed: 01/17/2012 Elsevier Interactive Patient Education 2016 [employer]     IF you received an x-ray today, you will receive an invoice from [facility]. Please contact [facility] at [phone] with questions or concerns regarding your invoice.   IF you received labwork today, you will receive an invoice from [facility]. Please contact [facility] at [phone] with questions or concerns regarding your invoice.   Our billing staff will not be able to assist you with questions  regarding bills from these companies.  You will be contacted with the lab results as soon as they are available. The fastest way to get your results is to activate your My Chart account. Instructions are located on the last page of this paperwork. If you have not heard from [place] regarding the results in 2 weeks, please contact this office.

## 2015-11-02 MED FILL — ALLOPURINOL 100 MG TABLET: 100 | 30 days supply | Qty: 30 | Fill #0

## 2015-11-04 DIAGNOSIS — H524 Presbyopia: Secondary | ICD-10-CM | POA: Diagnosis not present

## 2015-11-04 DIAGNOSIS — H5213 Myopia, bilateral: Secondary | ICD-10-CM | POA: Diagnosis not present

## 2015-11-04 DIAGNOSIS — H52223 Regular astigmatism, bilateral: Secondary | ICD-10-CM | POA: Diagnosis not present

## 2015-11-11 ENCOUNTER — Ambulatory Visit (INDEPENDENT_AMBULATORY_CARE_PROVIDER_SITE_OTHER): Payer: 59 | Admitting: Neurology

## 2015-11-11 DIAGNOSIS — R0683 Snoring: Secondary | ICD-10-CM

## 2015-11-11 DIAGNOSIS — R351 Nocturia: Secondary | ICD-10-CM

## 2015-11-11 DIAGNOSIS — G4710 Hypersomnia, unspecified: Secondary | ICD-10-CM

## 2015-11-11 DIAGNOSIS — G4726 Circadian rhythm sleep disorder, shift work type: Secondary | ICD-10-CM

## 2015-11-11 DIAGNOSIS — G4730 Sleep apnea, unspecified: Secondary | ICD-10-CM | POA: Diagnosis not present

## 2015-11-18 ENCOUNTER — Ambulatory Visit: Payer: 59 | Admitting: Family Medicine

## 2015-11-24 ENCOUNTER — Telehealth: Payer: Self-pay

## 2015-11-24 DIAGNOSIS — G4733 Obstructive sleep apnea (adult) (pediatric): Secondary | ICD-10-CM

## 2015-11-24 NOTE — Telephone Encounter (Signed)
I called pt to discuss sleep study results. No answer, left a message asking him to call me back.    

## 2015-11-29 NOTE — Telephone Encounter (Signed)
I spoke to pt regarding his sleep study results. I advised him that his sleep study revealed severe osa and Dr. Vickey Hugerohmeier recommended starting cpap. Pt says that he already has a cpap at home and wants to use that if he can, and he needs a new mask. I advised him that I would send the order to a DME and they could see if they can set his machine to 9 cm H2O with 1 cm EPR. If they cannot set his machine to this pressure, he will need a new cpap. Pt verbalized understanding. A follow up appt was scheduled for 02/17/16 at 8:30. Pt verbalized understanding. Will send to [facility]. [facility] will not set cpap machines other than the ones that they give to pts.

## 2015-11-29 NOTE — Telephone Encounter (Signed)
Patient is calling back to get the results of his sleep study.

## 2015-12-14 ENCOUNTER — Ambulatory Visit (INDEPENDENT_AMBULATORY_CARE_PROVIDER_SITE_OTHER): Payer: 59 | Admitting: Family Medicine

## 2015-12-14 VITALS — BP 152/92 | HR 49 | Temp 98.5°F | Resp 18 | Ht 71.0 in | Wt 248.0 lb

## 2015-12-14 DIAGNOSIS — J029 Acute pharyngitis, unspecified: Secondary | ICD-10-CM

## 2015-12-14 DIAGNOSIS — R590 Localized enlarged lymph nodes: Secondary | ICD-10-CM

## 2015-12-14 DIAGNOSIS — R599 Enlarged lymph nodes, unspecified: Secondary | ICD-10-CM | POA: Diagnosis not present

## 2015-12-14 LAB — POCT RAPID STREP A (OFFICE): Rapid Strep A Screen: NEGATIVE

## 2015-12-14 MED ORDER — AMOXICILLIN 500 MG PO CAPS: 500.0000 mg | ORAL_CAPSULE | Freq: Three times a day (TID) | ORAL | Status: DC

## 2015-12-14 NOTE — Patient Instructions (Addendum)
IF you received an x-ray today, you will receive an invoice from [facility]. Please contact [facility] at [phone] with questions or concerns regarding your invoice.   IF you received labwork today, you will receive an invoice from [facility]. Please contact [facility] at [phone] with questions or concerns regarding your invoice.   Our billing staff will not be able to assist you with questions regarding bills from these companies.  You will be contacted with the lab results as soon as they are available. The fastest way to get your results is to activate your My Chart account. Instructions are located on the last page of this paperwork. If you have not heard from us regarding the results in 2 weeks, please contact this office.     Your strep test in the office was negative, but can start antibiotics for now until the throat culture returns, as you do appear to have some swollen lymph nodes that may be due to infection. If you're not improving in next 3-4 days, or any worsening sooner, return for recheck. If symptoms are persisting into next week, and may be helpful to see your gastroenterologist. Again return if symptoms are worsening.  Sore Throat A sore throat is pain, burning, irritation, or scratchiness of the throat. There is often pain or tenderness when swallowing or talking. A sore throat may be accompanied by other symptoms, such as coughing, sneezing, fever, and swollen neck glands. A sore throat is often the first sign of another sickness, such as a cold, flu, strep throat, or mononucleosis (commonly known as mono). Most sore throats go away without medical treatment. CAUSES  The most common causes of a sore throat include:  A viral infection, such as a cold, flu, or mono.  A bacterial infection, such as strep throat, tonsillitis, or whooping cough.  Seasonal allergies.  Dryness in the air.  Irritants, such as smoke or  pollution.  Gastroesophageal reflux disease (GERD). HOME CARE INSTRUCTIONS   Only take over-the-counter medicines as directed by your caregiver.  Drink enough fluids to keep your urine clear or pale yellow.  Rest as needed.  Try using throat sprays, lozenges, or sucking on hard candy to ease any pain (if older than 4 years or as directed).  Sip warm liquids, such as broth, herbal tea, or warm water with honey to relieve pain temporarily. You may also eat or drink cold or frozen liquids such as frozen ice pops.  Gargle with salt water (mix 1 tsp salt with 8 oz of water).  Do not smoke and avoid secondhand smoke.  Put a cool-mist humidifier in your bedroom at night to moisten the air. You can also turn on a hot shower and sit in the bathroom with the door closed for 5-10 minutes. SEEK IMMEDIATE MEDICAL CARE IF:  You have difficulty breathing.  You are unable to swallow fluids, soft foods, or your saliva.  You have increased swelling in the throat.  Your sore throat does not get better in 7 days.  You have nausea and vomiting.  You have a fever or persistent symptoms for more than 2-3 days.  You have a fever and your symptoms suddenly get worse. MAKE SURE YOU:   Understand these instructions.  Will watch your condition.  Will get help right away if you are not doing well or get worse.   This information is not intended to replace advice given to you by your health care provider. Make sure  you discuss any questions you have with your health care provider.   Document Released: 07/13/2004 Document Revised: 06/26/2014 Document Reviewed: 02/11/2012 Elsevier Interactive Patient Education [employer] Inc[employer]

## 2015-12-14 NOTE — Progress Notes (Addendum)
Subjective:  By signing my name below, I, Raymond Green, attest that this documentation has been prepared under the direction and in the presence of [name], MD. Electronically Signed: Stann Oresung-Kai Green, Scribe. 12/14/2015 , 6:23 PM .  Patient was seen in Room 3 .   Patient ID: Raymond Green, male    DOB: 15-Apr-1966, 50 y.o.   MRN: [id number] Chief Complaint  Patient presents with  . Dysphagia    Since last week   HPI Raymond Green is a 50 y.o. male   Gout He has history of gout with flare up in May. He was treated with prednisone and colchicine. His last uric acid was 8.4 in May. He was advised to start allopurinol after that flare.   Dysphagia He has a history of GERD and is evaluated by GI in the past for abdominal pain. He is on prilosec 40mg  qd.   He's followed by Dr. Madilyn FiremanHayes and is scheduled for a capsule endoscopy.   Trouble Swallowing Patient states he's been having trouble swallowing since 5 days ago, like a sore throat infection. He has pain when trying to swallow solids and liquids. The pain has not worsened or improved since it started. He is still taking his prilosec daily and denies missing any doses. He denies food getting stuck. He denies any sick contact at home. He's tried drinking tea with lemon and honey without relief. He denies taking any medication for this issue.   Patient Active Problem List   Diagnosis Date Noted  . Elevated serum creatinine 05/29/2015  . Gout 10/29/2013  . HYPERTENSION 03/02/2008  . GERD 03/02/2008  . ANEMIA-IRON DEFICIENCY 12/13/2007  . HEMORRHOIDS-EXTERNAL 12/13/2007   Past Medical History  Diagnosis Date  . Hypertension   . OSA (obstructive sleep apnea)   . Anemia   . Positive PPD, treated 2000    INH  . Obesity   . Hemorrhoids   . LVH (left ventricular hypertrophy)     Dr. Donnie Ahoilley ( cardiology)  . Kidney insufficiency   . Vitamin D deficiency   . Headache(784.0)   . Allergy    Past Surgical History    Procedure Laterality Date  . Hemorrhoid surgery    . Colonoscopy  12/28/2011    Procedure: COLONOSCOPY;  Surgeon: Rachael Feeaniel P Jacobs, MD;  Location: [facility];  Service: Endoscopy;  Laterality: N/A;   Allergies  Allergen Reactions  . Lisinopril     angioedema   Prior to Admission medications   Medication Sig Start Date End Date Taking? Authorizing Provider  allopurinol (ZYLOPRIM) 100 MG tablet Take 1 tablet (100 mg total) by mouth daily. 11/01/15  Yes Peyton Najjaravid H Hopper, MD  docusate sodium (COLACE) 100 MG capsule Take 100 mg by mouth daily.   Yes Historical Provider, MD  hydrochlorothiazide (MICROZIDE) 12.5 MG capsule Take 1 capsule (12.5 mg total) by mouth daily. 10/19/15  Yes Shade FloodJeffrey R Tenzin Edelman, MD  losartan (COZAAR) 100 MG tablet Take 1 tablet (100 mg total) by mouth daily. 09/01/15  Yes Shade FloodJeffrey R Ricki Vanhandel, MD  Nebivolol HCl (BYSTOLIC) 20 MG TABS Take 1 tablet (20 mg total) by mouth 2 (two) times daily. 09/01/15  Yes Shade FloodJeffrey R Tenika Keeran, MD  omeprazole (PRILOSEC) 40 MG capsule Take 1 capsule (40 mg total) by mouth daily. 09/01/15  Yes Shade FloodJeffrey R Sultana Tierney, MD  colchicine 0.6 MG tablet Take 2 initially, then one tonight, then one daily until gout improved Patient not taking: Reported on 12/14/2015 11/01/15   Peyton Najjaravid H Hopper, MD  cyclobenzaprine (FLEXERIL) 5 MG tablet 1 pill by mouth up to every 8 hours as needed. Start with one pill by mouth each bedtime as needed due to sedation Patient not taking: Reported on 11/01/2015 10/19/15   Shade Flood, MD  predniSONE (DELTASONE) 20 MG tablet Take 3 pills today and tomorrow, then 2 daily for 2 days, then 1 daily for 2 days Patient not taking: Reported on 12/14/2015 11/01/15   Peyton Najjar, MD   Social History   Social History  . Marital Status: Married    Spouse Name: N/A  . Number of Children: N/A  . Years of Education: N/A   Occupational History  . Endoscopy Technician    Social History Main Topics  . Smoking status: Never Smoker   . Smokeless tobacco:  Never Used  . Alcohol Use: No     Comment: Sometimes  . Drug Use: No  . Sexual Activity: Yes   Other Topics Concern  . Not on file   Social History Narrative   Married   Education: College   Exercise: Yes   Review of Systems  Constitutional: Negative for fever, chills and fatigue.  HENT: Positive for sore throat and trouble swallowing. Negative for congestion.   Respiratory: Negative for cough, shortness of breath and wheezing.   Gastrointestinal: Negative for nausea, vomiting, diarrhea and constipation.       Objective:   Physical Exam  Constitutional: He is oriented to person, place, and time. He appears well-developed and well-nourished. No distress.  HENT:  Head: Normocephalic and atraumatic.  Mouth/Throat: Posterior oropharyngeal erythema present. No oropharyngeal exudate.  slight erythema in post oropharynx without exudate, somewhat difficult exam  Eyes: EOM are normal. Pupils are equal, round, and reactive to light.  Neck: Neck supple.  Cardiovascular: Normal rate.   Pulmonary/Chest: Effort normal. No respiratory distress.  Musculoskeletal: Normal range of motion.  Lymphadenopathy:       Head (left side): Submandibular adenopathy present.  Left and right anterior nodes both tender, positive submandibular on left  Neurological: He is alert and oriented to person, place, and time.  Skin: Skin is warm and dry.  Psychiatric: He has a normal mood and affect. His behavior is normal.  Nursing note and vitals reviewed.   Filed Vitals:   12/14/15 1722  BP: 152/92  Pulse: 49  Temp: 98.5 F (36.9 C)  TempSrc: Oral  Resp: 18  Height:  (1.803 m)  Weight: 248 lb (112.492 kg)  SpO2: 100%   Results for orders placed or performed in visit on 12/14/15  POCT rapid strep A  Result Value Ref Range   Rapid Strep A Screen Negative Negative      Assessment & Plan:   Raymond Green is a 50 y.o. male Sore throat - Plan: POCT rapid strep A, Culture, Group A Strep,  amoxicillin (AMOXIL) 500 MG capsule  Lymphadenopathy, anterior cervical - Plan: POCT rapid strep A, Culture, Group A Strep, amoxicillin (AMOXIL) 500 MG capsule  Start amoxicillin for now with lymphadenopathy and pharyngitis, but watch for culture. If negative, and improving, consider stopping amoxicillin. Symptomatic care discussed and RTC precautions given.  Meds ordered this encounter  Medications  . amoxicillin (AMOXIL) 500 MG capsule    Sig: Take 1 capsule (500 mg total) by mouth 3 (three) times daily.    Dispense:  30 capsule    Refill:  0   Patient Instructions       IF you received an x-ray today, you will  receive an Econom[facility]voice from [facility]. Please contact [facility] at [phone] with questions or concerns regarding your invoice.   IF you received labwork today, you will receive an invoice from [facility]. Please contact [facility] at [phone] with questions or concerns regarding your invoice.   Our billing staff will not be able to assist you with questions regarding bills from these companies.  You will be contacted with the lab results as soon as they are available. The fastest way to get your results is to activate your My Chart account. Instructions are located on the last page of this paperwork. If you have not heard from us regarding the results in 2 weeks, please contact this office.     Your strep test in the office was negative, but can start antibiotics for now until the throat culture returns, as you do appear to have some swollen lymph nodes that may be due to infection. If you're not improving in next 3-4 days, or any worsening sooner, return for recheck. If symptoms are persisting into next week, and may be helpful to see your gastroenterologist. Again return if symptoms are worsening.  Sore Throat A sore throat is pain, burning, irritation, or scratchiness of the throat. There is often pain or tenderness when  swallowing or talking. A sore throat may be accompanied by other symptoms, such as coughing, sneezing, fever, and swollen neck glands. A sore throat is often the first sign of another sickness, such as a cold, flu, strep throat, or mononucleosis (commonly known as mono). Most sore throats go away without medical treatment. CAUSES  The most common causes of a sore throat include:  A viral infection, such as a cold, flu, or mono.  A bacterial infection, such as strep throat, tonsillitis, or whooping cough.  Seasonal allergies.  Dryness in the air.  Irritants, such as smoke or pollution.  Gastroesophageal reflux disease (GERD). HOME CARE INSTRUCTIONS   Only take over-the-counter medicines as directed by your caregiver.  Drink enough fluids to keep your urine clear or pale yellow.  Rest as needed.  Try using throat sprays, lozenges, or sucking on hard candy to ease any pain (if older than 4 years or as directed).  Sip warm liquids, such as broth, herbal tea, or warm water with honey to relieve pain temporarily. You may also eat or drink cold or frozen liquids such as frozen ice pops.  Gargle with salt water (mix 1 tsp salt with 8 oz of water).  Do not smoke and avoid secondhand smoke.  Put a cool-mist humidifier in your bedroom at night to moisten the air. You can also turn on a hot shower and sit in the bathroom with the door closed for 5-10 minutes. SEEK IMMEDIATE MEDICAL CARE IF:  You have difficulty breathing.  You are unable to swallow fluids, soft foods, or your saliva.  You have increased swelling in the throat.  Your sore throat does not get better in 7 days.  You have nausea and vomiting.  You have a fever or persistent symptoms for more than 2-3 days.  You have a fever and your symptoms suddenly get worse. MAKE SURE YOU:   Understand these instructions.  Will watch your condition.  Will get help right away if you are not doing well or get worse.   This  information is not intended to replace advice given to you by your health care provider. Make sure you discuss any questions you have with your health care  provider.   Document Released: 07/13/2004 Document Revised: 06/26/2014 Document Reviewed: 02/11/2012 Elsevier Interactive Patient Education [employer]     I personally performed the services described in this documentation, which was scribed in my presence. The recorded information has been reviewed and considered, and addended by me as needed.   Signed,   [name], MD [facility].  12/14/2015 6:41 PM

## 2015-12-15 MED FILL — AMOXICILLIN 500 MG CAPSULE: 500 | 10 days supply | Qty: 30 | Fill #0

## 2015-12-16 LAB — CULTURE, GROUP A STREP: ORGANISM ID, BACTERIA: NORMAL

## 2015-12-20 MED FILL — ALLOPURINOL 100 MG TABLET: 100 | 30 days supply | Qty: 30 | Fill #1

## 2015-12-30 ENCOUNTER — Encounter: Payer: Self-pay | Admitting: Family Medicine

## 2015-12-30 ENCOUNTER — Ambulatory Visit (INDEPENDENT_AMBULATORY_CARE_PROVIDER_SITE_OTHER): Payer: 59 | Admitting: Family Medicine

## 2015-12-30 VITALS — BP 135/80 | HR 50 | Temp 98.7°F | Resp 16 | Ht 71.0 in | Wt 249.4 lb

## 2015-12-30 DIAGNOSIS — K219 Gastro-esophageal reflux disease without esophagitis: Secondary | ICD-10-CM

## 2015-12-30 DIAGNOSIS — E663 Overweight: Secondary | ICD-10-CM

## 2015-12-30 DIAGNOSIS — I10 Essential (primary) hypertension: Secondary | ICD-10-CM | POA: Diagnosis not present

## 2015-12-30 DIAGNOSIS — M1009 Idiopathic gout, multiple sites: Secondary | ICD-10-CM | POA: Diagnosis not present

## 2015-12-30 DIAGNOSIS — M109 Gout, unspecified: Secondary | ICD-10-CM

## 2015-12-30 LAB — BASIC METABOLIC PANEL
BUN: 19 mg/dL (ref 7–25)
CALCIUM: 9.4 mg/dL (ref 8.6–10.3)
CO2: 27 mmol/L (ref 20–31)
Chloride: 106 mmol/L (ref 98–110)
Creat: 1.82 mg/dL — ABNORMAL HIGH (ref 0.60–1.35)
GLUCOSE: 65 mg/dL (ref 65–99)
POTASSIUM: 4.1 mmol/L (ref 3.5–5.3)
SODIUM: 140 mmol/L (ref 135–146)

## 2015-12-30 LAB — URIC ACID: URIC ACID, SERUM: 6.9 mg/dL (ref 4.0–8.0)

## 2015-12-30 NOTE — Progress Notes (Signed)
By signing my name below, I, Mesha Guinyard, attest that this documentation has been prepared under the direction and in the presence of [name], MD.  Electronically Signed: Arvilla Market, Medical Scribe. 12/30/2015. 2:48 PM.  Subjective:    Patient ID: Raymond Green, male    DOB: Apr 06, 1966, 50 y.o.   MRN: [id number]  HPI  Chief Complaint  Patient presents with  . Follow-up  . Hypertension  . Medication Refill    HCTZ 12.5 mg, Bystolic 20 mg, Omeprazole 40 mg    HPI Comments: TORRES HARDENBROOK is a 50 y.o. male who presents to the [facility] for follow-up for HTN, and GERD. Pt is concerned about how much weight he could lose.  HTN: Takes Bystolic and HCTZ. Pt denies side affects on this medication- light-headedness, chest pain, dizziness, Pt checks his bp at home and it ranges around 140(sometimes lower)/85 at home.    Lab Results  Component Value Date   CREATININE 1.62* 11/01/2015  This was slightly up from 1.5 in March  GERD: Pt takes 40 mg of Prilosec everyday. Pt reports occasional abdominal pain when he lays on his left side after eating spicy foods. Pt's gastroenterologist is Dr. Madilyn Fireman  Gout: Pt was put on Allopurinol back in May. Pt denies experiencing a recent flare up.Tolerating allopurinol without new side effects.  Patient Active Problem List   Diagnosis Date Noted  . Elevated serum creatinine 05/29/2015  . Gout 10/29/2013  . HYPERTENSION 03/02/2008  . GERD 03/02/2008  . ANEMIA-IRON DEFICIENCY 12/13/2007  . HEMORRHOIDS-EXTERNAL 12/13/2007   Past Medical History  Diagnosis Date  . Hypertension   . OSA (obstructive sleep apnea)   . Anemia   . Positive PPD, treated 2000    INH  . Obesity   . Hemorrhoids   . LVH (left ventricular hypertrophy)     Dr. Donnie Aho ( cardiology)  . Kidney insufficiency   . Vitamin D deficiency   . Headache(784.0)   . Allergy    Past Surgical History  Procedure Laterality Date  . Hemorrhoid  surgery    . Colonoscopy  12/28/2011    Procedure: COLONOSCOPY;  Surgeon: [name], MD;  Location: [facility];  Service: Endoscopy;  Laterality: N/A;   Allergies  Allergen Reactions  . Lisinopril     angioedema   Prior to Admission medications   Medication Sig Start Date End Date Taking? Authorizing Provider  allopurinol (ZYLOPRIM) 100 MG tablet Take 1 tablet (100 mg total) by mouth daily. 11/01/15  Yes Peyton Najjar, MD  amoxicillin (AMOXIL) 500 MG capsule Take 1 capsule (500 mg total) by mouth 3 (three) times daily. 12/14/15  Yes Shade Flood, MD  docusate sodium (COLACE) 100 MG capsule Take 100 mg by mouth daily.   Yes Historical Provider, MD  hydrochlorothiazide (MICROZIDE) 12.5 MG capsule Take 1 capsule (12.5 mg total) by mouth daily. 10/19/15  Yes Shade Flood, MD  losartan (COZAAR) 100 MG tablet Take 1 tablet (100 mg total) by mouth daily. 09/01/15  Yes Shade Flood, MD  Nebivolol HCl (BYSTOLIC) 20 MG TABS Take 1 tablet (20 mg total) by mouth 2 (two) times daily. 09/01/15  Yes Shade Flood, MD  omeprazole (PRILOSEC) 40 MG capsule Take 1 capsule (40 mg total) by mouth daily. 09/01/15  Yes Shade Flood, MD  colchicine 0.6 MG tablet  11/01/15   Historical Provider, MD   Social History   Social History  . Marital Status: Married  Spouse Name: N/A  . Number of Children: N/A  . Years of Education: N/A   Occupational History  . Endoscopy Technician    Social History Main Topics  . Smoking status: Never Smoker   . Smokeless tobacco: Never Used  . Alcohol Use: No     Comment: Sometimes  . Drug Use: No  . Sexual Activity: Yes   Other Topics Concern  . Not on file   Social History Narrative   Married   Education: College   Exercise: Yes   Review of Systems  Constitutional: Negative for fatigue and unexpected weight change.  Eyes: Negative for visual disturbance.  Respiratory: Negative for cough, chest tightness and shortness of breath.     Cardiovascular: Negative for chest pain, palpitations and leg swelling.  Gastrointestinal: Negative for abdominal pain and blood in stool.  Neurological: Negative for dizziness, light-headedness and headaches.   Objective:  BP 135/80 mmHg  Pulse 50  Temp(Src) 98.7 F (37.1 C) (Oral)  Resp 16  Ht  (1.803 m)  Wt 249 lb 6.4 oz (113.127 kg)  BMI 34.80 kg/m2  SpO2 99%  Physical Exam  Constitutional: He is oriented to person, place, and time. He appears well-developed and well-nourished.  HENT:  Head: Normocephalic and atraumatic.  Eyes: EOM are normal. Pupils are equal, round, and reactive to light.  Neck: No JVD present. Carotid bruit is not present.  Cardiovascular: Normal rate, regular rhythm and normal heart sounds.   No murmur heard. Pulmonary/Chest: Effort normal and breath sounds normal. He has no rales.  Musculoskeletal: He exhibits no edema.  Neurological: He is alert and oriented to person, place, and time.  Skin: Skin is warm and dry.  Psychiatric: He has a normal mood and affect.  Vitals reviewed.  Assessment & Plan:   Raymond Green is a 50 y.o. male Essential hypertension - Plan: Basic metabolic panel  -Controlled. No change in medications needed this time.  -DASH diet information given to help with sodium and healthy eating.  Gouty arthritis - Plan: Uric Acid  -Tolerating allopurinol. Check uric acid level, has colchicine if needed for acute flair.  Gastroesophageal reflux disease, esophagitis presence not specified  continue PPI as needed, avoid trigger foods, continue follow-up with gastroenterologist.  Overweight  -Reasonable goals discussed, diet, exercise, can recheck status in 6 months. Consider nutritionist if needed  Meds ordered this encounter  Medications  . colchicine 0.6 MG tablet    Sig:     Refill:  1   Patient Instructions       IF you received an x-ray today, you will receive an invoice from [facility]. Please  contact [facility] at [phone] with questions or concerns regarding your invoice.   IF you received labwork today, you will receive an invoice from [employer]. Please contact [facility] at [phone] with questions or concerns regarding your invoice.   Our billing staff will not be able to assist you with questions regarding bills from these companies.  You will be contacted with the lab results as soon as they are available. The fastest way to get your results is to activate your My Chart account. Instructions are located on the last page of this paperwork. If you have not heard from [place] regarding the results in 2 weeks, please contact this office.    Work on diet changes and activity for weight loss. A goal of 20-25 pounds would be reasonable. Check your blood pressures outside of the office, and  if they remain over 140/90, return for discussion of medication changes.  Continue allopurinol at same dose for gout, if you have flares, return to discuss possible change in medications.  Avoid foods known to cause heartburn as below, and only use heartburn medicine as needed or instructed by your gastroenterologist. I have also included information on the DASH diet to help with salt in the diet to help with blood pressure management. Follow-up with me in 6 months.  Food Choices for Gastroesophageal Reflux Disease, Adult When you have gastroesophageal reflux disease (GERD), the foods you eat and your eating habits are very important. Choosing the right foods can help ease the discomfort of GERD. WHAT GENERAL GUIDELINES DO I NEED TO FOLLOW?  Choose fruits, vegetables, whole grains, low-fat dairy products, and low-fat meat, fish, and poultry.  Limit fats such as oils, salad dressings, butter, nuts, and avocado.  Keep a food diary to identify foods that cause symptoms.  Avoid foods that cause reflux. These may be different for different people.  Eat frequent  small meals instead of three large meals each day.  Eat your meals slowly, in a relaxed setting.  Limit fried foods.  Cook foods using methods other than frying.  Avoid drinking alcohol.  Avoid drinking large amounts of liquids with your meals.  Avoid bending over or lying down until 2-3 hours after eating. WHAT FOODS ARE NOT RECOMMENDED? The following are some foods and drinks that may worsen your symptoms: Vegetables Tomatoes. Tomato juice. Tomato and spaghetti sauce. Chili peppers. Onion and garlic. Horseradish. Fruits Oranges, grapefruit, and lemon (fruit and juice). Meats High-fat meats, fish, and poultry. This includes hot dogs, ribs, ham, sausage, salami, and bacon. Dairy Whole milk and chocolate milk. Sour cream. Cream. Butter. Ice cream. Cream cheese.  Beverages Coffee and tea, with or without caffeine. Carbonated beverages or energy drinks. Condiments Hot sauce. Barbecue sauce.  Sweets/Desserts Chocolate and cocoa. Donuts. Peppermint and spearmint. Fats and Oils High-fat foods, including [place] fries and potato chips. Other Vinegar. Strong spices, such as black pepper, white pepper, red pepper, cayenne, curry powder, cloves, ginger, and chili powder. The items listed above may not be a complete list of foods and beverages to avoid. Contact your dietitian for more information.   This information is not intended to replace advice given to you by your health care provider. Make sure you discuss any questions you have with your health care provider.   Document Released: 06/05/2005 Document Revised: 06/26/2014 Document Reviewed: 04/09/2013 Elsevier Interactive Patient Education 2016 Elsevier Inc. DASH Eating Plan DASH stands for "Dietary Approaches to Stop Hypertension." The DASH eating plan is a healthy eating plan that has been shown to reduce high blood pressure (hypertension). Additional health benefits may include reducing the risk of type 2 diabetes mellitus, heart  disease, and stroke. The DASH eating plan may also help with weight loss. WHAT DO I NEED TO KNOW ABOUT THE DASH EATING PLAN? For the DASH eating plan, you will follow these general guidelines:  Choose foods with a percent daily value for sodium of less than 5% (as listed on the food label).  Use salt-free seasonings or herbs instead of table salt or sea salt.  Check with your health care provider or pharmacist before using salt substitutes.  Eat lower-sodium products, often labeled as "lower sodium" or "no salt added."  Eat fresh foods.  Eat more vegetables, fruits, and low-fat dairy products.  Choose whole grains. Look for the word "whole" as the first word in  the ingredient list.  Choose fish and skinless chicken or [place] more often than red meat. Limit fish, poultry, and meat to 6 oz (170 g) each day.  Limit sweets, desserts, sugars, and sugary drinks.  Choose heart-healthy fats.  Limit cheese to 1 oz (28 g) per day.  Eat more home-cooked food and less restaurant, buffet, and fast food.  Limit fried foods.  Cook foods using methods other than frying.  Limit canned vegetables. If you do use them, rinse them well to decrease the sodium.  When eating at a restaurant, ask that your food be prepared with less salt, or no salt if possible. WHAT FOODS CAN I EAT? Seek help from a dietitian for individual calorie needs. Grains Whole grain or whole wheat bread. Brown rice. Whole grain or whole wheat pasta. Quinoa, bulgur, and whole grain cereals. Low-sodium cereals. Corn or whole wheat flour tortillas. Whole grain cornbread. Whole grain crackers. Low-sodium crackers. Vegetables Fresh or frozen vegetables (raw, steamed, roasted, or grilled). Low-sodium or reduced-sodium tomato and vegetable juices. Low-sodium or reduced-sodium tomato sauce and paste. Low-sodium or reduced-sodium canned vegetables.  Fruits All fresh, canned (in natural juice), or frozen fruits. Meat and Other  Protein Products Ground beef (85% or leaner), grass-fed beef, or beef trimmed of fat. Skinless chicken or [place]. Ground chicken or [place]. Pork trimmed of fat. All fish and seafood. Eggs. Dried beans, peas, or lentils. Unsalted nuts and seeds. Unsalted canned beans. Dairy Low-fat dairy products, such as skim or 1% milk, 2% or reduced-fat cheeses, low-fat ricotta or cottage cheese, or plain low-fat yogurt. Low-sodium or reduced-sodium cheeses. Fats and Oils Tub margarines without trans fats. Light or reduced-fat mayonnaise and salad dressings (reduced sodium). Avocado. Safflower, olive, or canola oils. Natural peanut or almond butter. Other Unsalted popcorn and pretzels. The items listed above may not be a complete list of recommended foods or beverages. Contact your dietitian for more options. WHAT FOODS ARE NOT RECOMMENDED? Grains White bread. White pasta. White rice. Refined cornbread. Bagels and croissants. Crackers that contain trans fat. Vegetables Creamed or fried vegetables. Vegetables in a cheese sauce. Regular canned vegetables. Regular canned tomato sauce and paste. Regular tomato and vegetable juices. Fruits Dried fruits. Canned fruit in light or heavy syrup. Fruit juice. Meat and Other Protein Products Fatty cuts of meat. Ribs, chicken wings, bacon, sausage, bologna, salami, chitterlings, fatback, hot dogs, bratwurst, and packaged luncheon meats. Salted nuts and seeds. Canned beans with salt. Dairy Whole or 2% milk, cream, half-and-half, and cream cheese. Whole-fat or sweetened yogurt. Full-fat cheeses or blue cheese. Nondairy creamers and whipped toppings. Processed cheese, cheese spreads, or cheese curds. Condiments Onion and garlic salt, seasoned salt, table salt, and sea salt. Canned and packaged gravies. Worcestershire sauce. Tartar sauce. Barbecue sauce. Teriyaki sauce. Soy sauce, including reduced sodium. Steak sauce. Fish sauce. Oyster sauce. Cocktail sauce. Horseradish.  Ketchup and mustard. Meat flavorings and tenderizers. Bouillon cubes. Hot sauce. Tabasco sauce. Marinades. Taco seasonings. Relishes. Fats and Oils Butter, stick margarine, lard, shortening, ghee, and bacon fat. Coconut, palm kernel, or palm oils. Regular salad dressings. Other Pickles and olives. Salted popcorn and pretzels. The items listed above may not be a complete list of foods and beverages to avoid. Contact your dietitian for more information. WHERE CAN I FIND MORE INFORMATION? National Heart, Lung, and Blood Institute: CableProm[employer].itwww.nhlbi.nih.gov/health/health-topics/topics/dash/   This information is not intended to replace advice given to you by your health care provider. Make sure you discuss any questions you have with your health  care provider.   Document Released: 05/25/2011 Document Revised: 06/26/2014 Document Reviewed: 04/09/2013 Elsevier Interactive Patient Education [employer]     I personally performed the services described in this documentation, which was scribed in my presence. The recorded information has been reviewed and considered, and addended by me as needed.   Signed,   [name], MD [facility].  01/01/2016 10:02 PM

## 2015-12-30 NOTE — Patient Instructions (Addendum)
IF you received an x-ray today, you will receive an invoice from [facility]. Please contact [facility] at [phone] with questions or concerns regarding your invoice.   IF you received labwork today, you will receive an invoice from [facility]. Please contact [facility] at [phone] with questions or concerns regarding your invoice.   Our billing staff will not be able to assist you with questions regarding bills from these companies.  You will be contacted with the lab results as soon as they are available. The fastest way to get your results is to activate your My Chart account. Instructions are located on the last page of this paperwork. If you have not heard from us regarding the results in 2 weeks, please contact this office.    Work on diet changes and activity for weight loss. A goal of 20-25 pounds would be reasonable. Check your blood pressures outside of the office, and if they remain over 140/90, return for discussion of medication changes.  Continue allopurinol at same dose for gout, if you have flares, return to discuss possible change in medications.  Avoid foods known to cause heartburn as below, and only use heartburn medicine as needed or instructed by your gastroenterologist. I have also included information on the DASH diet to help with salt in the diet to help with blood pressure management. Follow-up with me in 6 months.  Food Choices for Gastroesophageal Reflux Disease, Adult When you have gastroesophageal reflux disease (GERD), the foods you eat and your eating habits are very important. Choosing the right foods can help ease the discomfort of GERD. WHAT GENERAL GUIDELINES DO I NEED TO FOLLOW?  Choose fruits, vegetables, whole grains, low-fat dairy products, and low-fat meat, fish, and poultry.  Limit fats such as oils, salad dressings, butter, nuts, and avocado.  Keep a food diary to identify foods that cause  symptoms.  Avoid foods that cause reflux. These may be different for different people.  Eat frequent small meals instead of three large meals each day.  Eat your meals slowly, in a relaxed setting.  Limit fried foods.  Cook foods using methods other than frying.  Avoid drinking alcohol.  Avoid drinking large amounts of liquids with your meals.  Avoid bending over or lying down until 2-3 hours after eating. WHAT FOODS ARE NOT RECOMMENDED? The following are some foods and drinks that may worsen your symptoms: Vegetables Tomatoes. Tomato juice. Tomato and spaghetti sauce. Chili peppers. Onion and garlic. Horseradish. Fruits Oranges, grapefruit, and lemon (fruit and juice). Meats High-fat meats, fish, and poultry. This includes hot dogs, ribs, ham, sausage, salami, and bacon. Dairy Whole milk and chocolate milk. Sour cream. Cream. Butter. Ice cream. Cream cheese.  Beverages Coffee and tea, with or without caffeine. Carbonated beverages or energy drinks. Condiments Hot sauce. Barbecue sauce.  Sweets/Desserts Chocolate and cocoa. Donuts. Peppermint and spearmint. Fats and Oils High-fat foods, including [place] fries and potato chips. Other Vinegar. Strong spices, such as black pepper, white pepper, red pepper, cayenne, curry powder, cloves, ginger, and chili powder. The items listed above may not be a complete list of foods and beverages to avoid. Contact your dietitian for more information.   This information is not intended to replace advice given to you by your health care provider. Make sure you discuss any questions you have with your health care provider.   Document Released: 06/05/2005 Document Revised: 06/26/2014 Document Reviewed: 04/09/2013 Elsevier Interactive Patient Education 2016 [employer] CareDASH Eating Plan  DASH stands for "Dietary Approaches to Stop Hypertension." The DASH eating plan is a healthy eating plan that has been shown to reduce high blood pressure  (hypertension). Additional health benefits may include reducing the risk of type 2 diabetes mellitus, heart disease, and stroke. The DASH eating plan may also help with weight loss. WHAT DO I NEED TO KNOW ABOUT THE DASH EATING PLAN? For the DASH eating plan, you will follow these general guidelines:  Choose foods with a percent daily value for sodium of less than 5% (as listed on the food label).  Use salt-free seasonings or herbs instead of table salt or sea salt.  Check with your health care provider or pharmacist before using salt substitutes.  Eat lower-sodium products, often labeled as "lower sodium" or "no salt added."  Eat fresh foods.  Eat more vegetables, fruits, and low-fat dairy products.  Choose whole grains. Look for the word "whole" as the first word in the ingredient list.  Choose fish and skinless chicken or [place] more often than red meat. Limit fish, poultry, and meat to 6 oz (170 g) each day.  Limit sweets, desserts, sugars, and sugary drinks.  Choose heart-healthy fats.  Limit cheese to 1 oz (28 g) per day.  Eat more home-cooked food and less restaurant, buffet, and fast food.  Limit fried foods.  Cook foods using methods other than frying.  Limit canned vegetables. If you do use them, rinse them well to decrease the sodium.  When eating at a restaurant, ask that your food be prepared with less salt, or no salt if possible. WHAT FOODS CAN I EAT? Seek help from a dietitian for individual calorie needs. Grains Whole grain or whole wheat bread. Brown rice. Whole grain or whole wheat pasta. Quinoa, bulgur, and whole grain cereals. Low-sodium cereals. Corn or whole wheat flour tortillas. Whole grain cornbread. Whole grain crackers. Low-sodium crackers. Vegetables Fresh or frozen vegetables (raw, steamed, roasted, or grilled). Low-sodium or reduced-sodium tomato and vegetable juices. Low-sodium or reduced-sodium tomato sauce and paste. Low-sodium or  reduced-sodium canned vegetables.  Fruits All fresh, canned (in natural juice), or frozen fruits. Meat and Other Protein Products Ground beef (85% or leaner), grass-fed beef, or beef trimmed of fat. Skinless chicken or [place]. Ground chicken or [place]. Pork trimmed of fat. All fish and seafood. Eggs. Dried beans, peas, or lentils. Unsalted nuts and seeds. Unsalted canned beans. Dairy Low-fat dairy products, such as skim or 1% milk, 2% or reduced-fat cheeses, low-fat ricotta or cottage cheese, or plain low-fat yogurt. Low-sodium or reduced-sodium cheeses. Fats and Oils Tub margarines without trans fats. Light or reduced-fat mayonnaise and salad dressings (reduced sodium). Avocado. Safflower, olive, or canola oils. Natural peanut or almond butter. Other Unsalted popcorn and pretzels. The items listed above may not be a complete list of recommended foods or beverages. Contact your dietitian for more options. WHAT FOODS ARE NOT RECOMMENDED? Grains White bread. White pasta. White rice. Refined cornbread. Bagels and croissants. Crackers that contain trans fat. Vegetables Creamed or fried vegetables. Vegetables in a cheese sauce. Regular canned vegetables. Regular canned tomato sauce and paste. Regular tomato and vegetable juices. Fruits Dried fruits. Canned fruit in light or heavy syrup. Fruit juice. Meat and Other Protein Products Fatty cuts of meat. Ribs, chicken wings, bacon, sausage, bologna, salami, chitterlings, fatback, hot dogs, bratwurst, and packaged luncheon meats. Salted nuts and seeds. Canned beans with salt. Dairy Whole or 2% milk, cream, half-and-half, and cream cheese. Whole-fat or sweetened yogurt. Full-fat cheeses or  blue cheese. Nondairy creamers and whipped toppings. Processed cheese, cheese spreads, or cheese curds. Condiments Onion and garlic salt, seasoned salt, table salt, and sea salt. Canned and packaged gravies. Worcestershire sauce. Tartar sauce. Barbecue sauce. Teriyaki  sauce. Soy sauce, including reduced sodium. Steak sauce. Fish sauce. Oyster sauce. Cocktail sauce. Horseradish. Ketchup and mustard. Meat flavorings and tenderizers. Bouillon cubes. Hot sauce. Tabasco sauce. Marinades. Taco seasonings. Relishes. Fats and Oils Butter, stick margarine, lard, shortening, ghee, and bacon fat. Coconut, palm kernel, or palm oils. Regular salad dressings. Other Pickles and olives. Salted popcorn and pretzels. The items listed above may not be a complete list of foods and beverages to avoid. Contact your dietitian for more information. WHERE CAN I FIND MORE INFORMATION? National Heart, Lung, and Blood Institute: CableProm[employer].it   This information is not intended to replace advice given to you by your health care provider. Make sure you discuss any questions you have with your health care provider.   Document Released: 05/25/2011 Document Revised: 06/26/2014 Document Reviewed: 04/09/2013 Elsevier Interactive Patient Education [employer]

## 2016-01-11 ENCOUNTER — Encounter: Payer: Self-pay | Admitting: Emergency Medicine

## 2016-01-11 ENCOUNTER — Telehealth: Payer: Self-pay | Admitting: Emergency Medicine

## 2016-01-11 NOTE — Telephone Encounter (Signed)
-----   Message from Shade Flood, MD sent at 01/10/2016  4:32 PM EDT ----- Call patient.  Gout tests was normal. Kidney function was slightly lower than readings in the past (creatinine was elevated).  Follow-up in the next 4-6 weeks to recheck kidney function tests, make sure he is staying well hydrated and avoid medicines like Advil, Aleve or other NSAIDs in the meantime.

## 2016-01-12 ENCOUNTER — Telehealth: Payer: Self-pay

## 2016-01-12 DIAGNOSIS — I10 Essential (primary) hypertension: Secondary | ICD-10-CM

## 2016-01-12 NOTE — Telephone Encounter (Signed)
Patient is requesting a refill for actz 12.5mg . [facility]

## 2016-01-13 MED ORDER — HYDROCHLOROTHIAZIDE 12.5 MG PO CAPS: 12.5000 mg | ORAL_CAPSULE | Freq: Every day | ORAL | 0 refills | Status: DC

## 2016-01-13 NOTE — Telephone Encounter (Signed)
Rx sent 

## 2016-02-10 IMAGING — CR DG ABDOMEN ACUTE W/ 1V CHEST
3 series · 3 of 3 positions shown · non-contrast
Comparison: 04/29/2009.

CLINICAL DATA: LEFT flank pain.  Initial encounter.

EXAM:
ACUTE ABDOMEN SERIES (ABDOMEN 2 VIEW & CHEST 1 VIEW)

[PA]
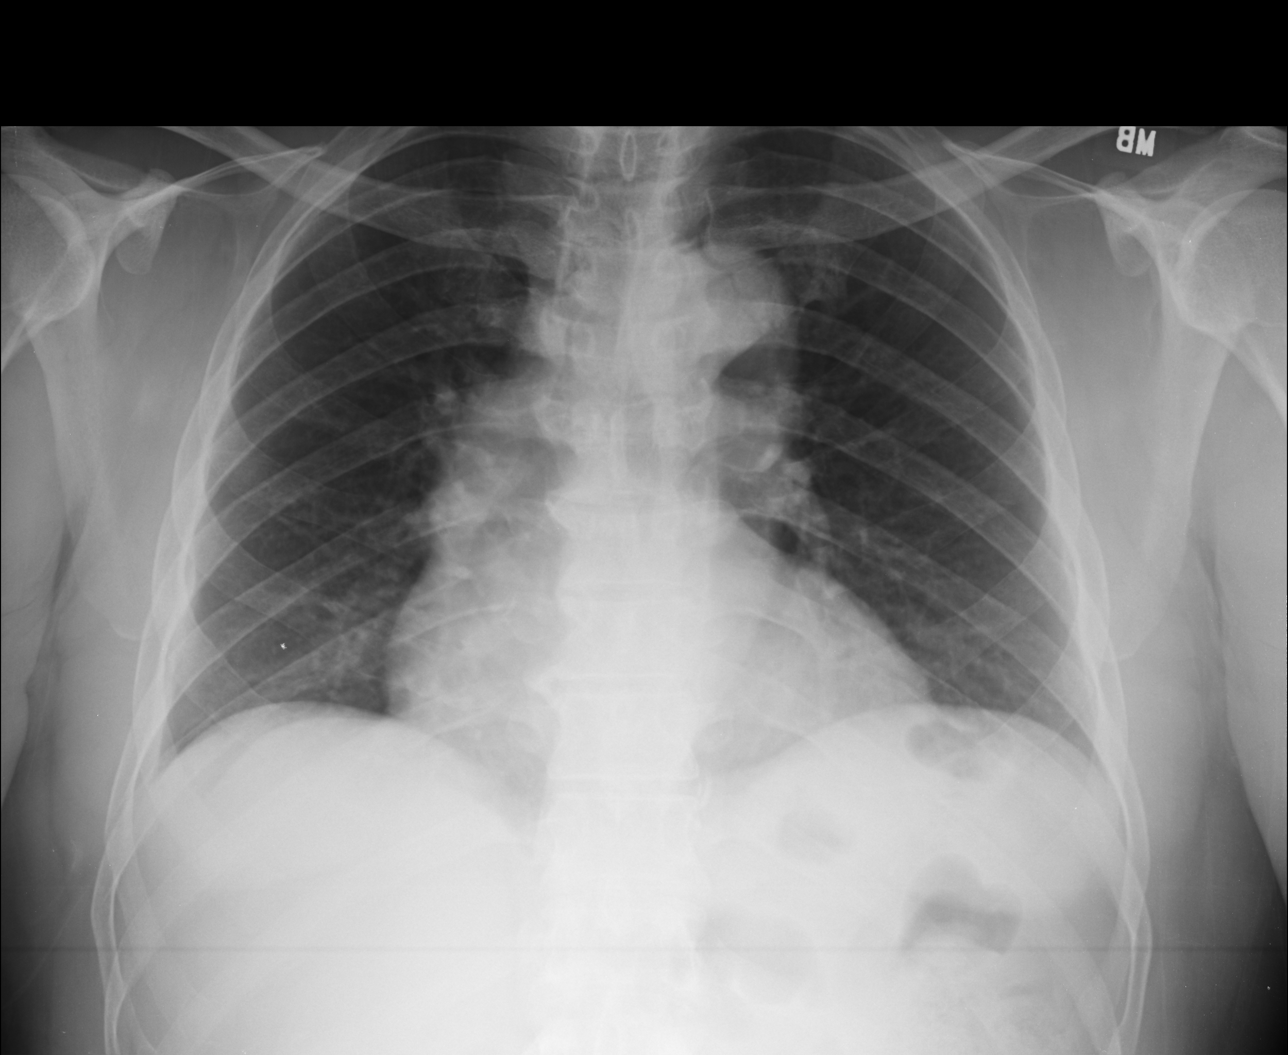

[AP (1 of 2)]
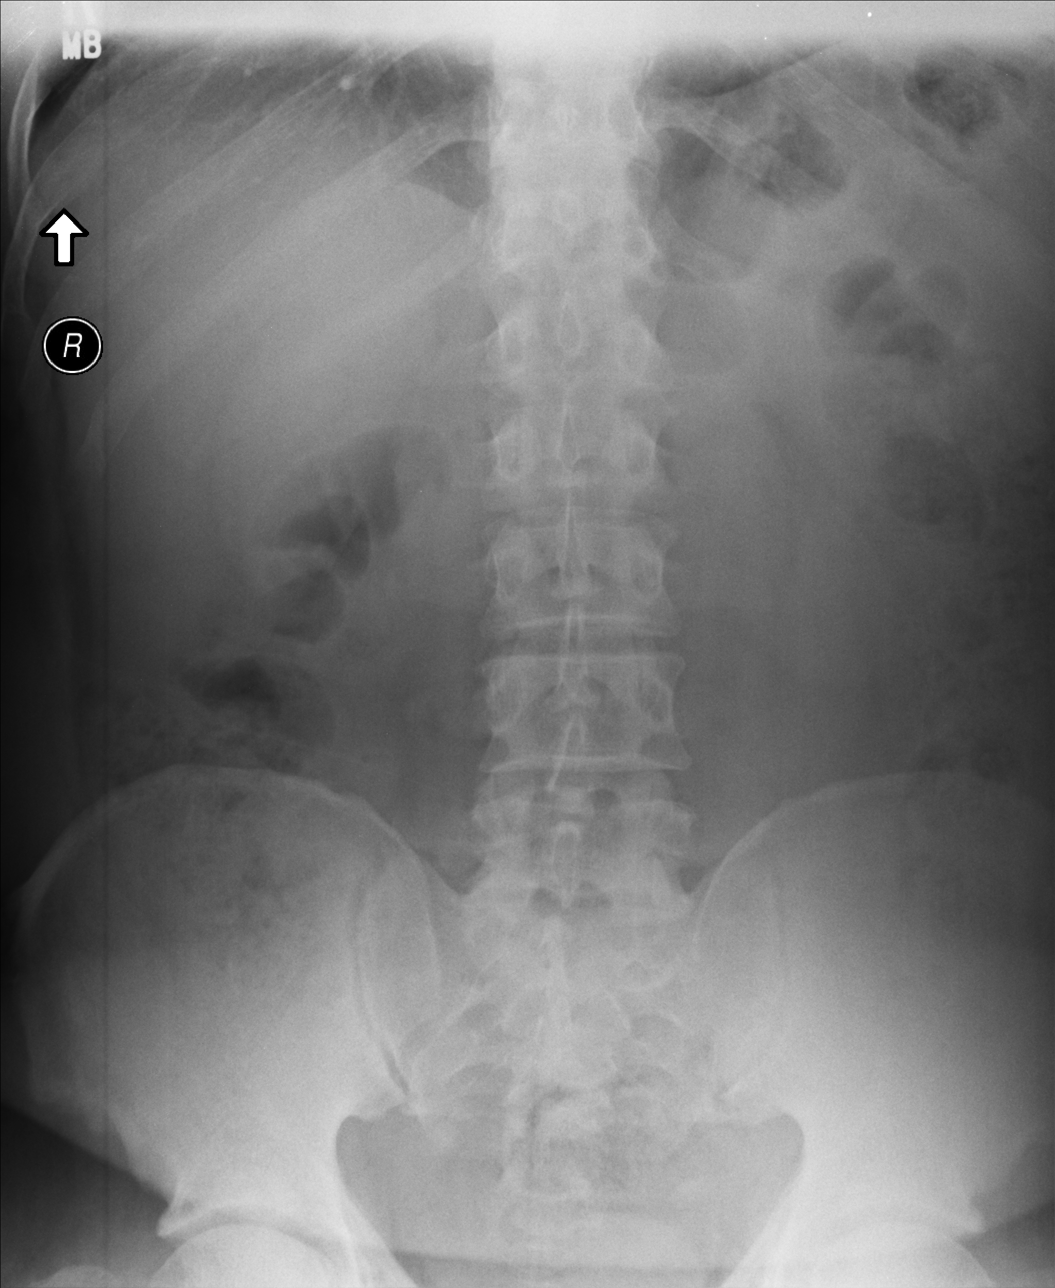

[AP (2 of 2)]
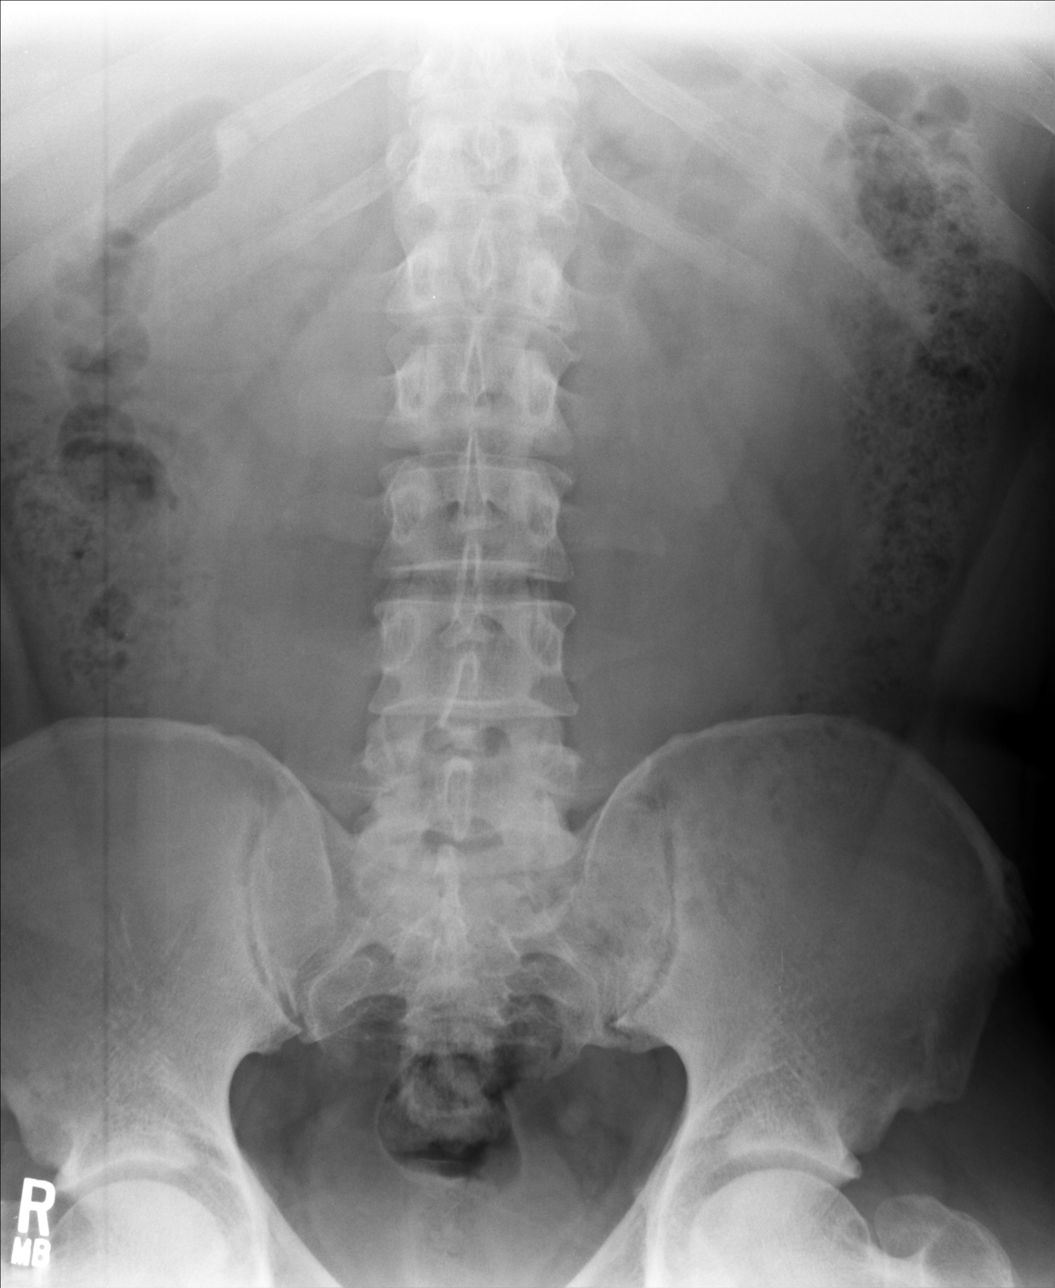

[3 of 3 positions shown; findings below may reference images not displayed]

FINDINGS: There is no evidence of dilated bowel loops or free intraperitoneal
air. No radiopaque calculi or other significant radiographic
abnormality is seen. Both lungs appear clear. Tortuous thoracic
aorta appears unchanged compared to prior exam. Moderate stool
burden. This report agrees with the preliminary assessment.
IMPRESSION: Negative abdominal radiographs.  No acute cardiopulmonary disease.

## 2016-02-11 MED FILL — HYDROCHLOROTHIAZIDE 12.5 MG: 12.5 | 90 days supply | Qty: 90 | Fill #0

## 2016-02-11 MED FILL — ALLOPURINOL 100 MG TABLET: 100 | 30 days supply | Qty: 30 | Fill #2

## 2016-02-17 ENCOUNTER — Ambulatory Visit (INDEPENDENT_AMBULATORY_CARE_PROVIDER_SITE_OTHER): Payer: 59 | Admitting: Neurology

## 2016-02-17 ENCOUNTER — Encounter: Payer: Self-pay | Admitting: Neurology

## 2016-02-17 VITALS — BP 140/88 | HR 48 | Resp 20 | Ht 72.0 in | Wt 246.0 lb

## 2016-02-17 DIAGNOSIS — G4733 Obstructive sleep apnea (adult) (pediatric): Secondary | ICD-10-CM | POA: Diagnosis not present

## 2016-02-17 NOTE — Patient Instructions (Signed)

## 2016-02-17 NOTE — Progress Notes (Signed)
SLEEP MEDICINE CLINIC   Provider:  Melvyn Novasarmen  Tichina Koebel, M D  Referring Provider: Jonita Green, Raymond Green, Green Primary Care Physician:  Raymond Green, Raymond Green  Chief Complaint  Patient presents with  . Follow-up    pt says he never received a new cpap. i called [facility], [facility] has called him 4 times but he has never responded. i again told pt he must call [employer] to get his new cpap.    HPI:  Raymond Green is a 50 y.o. male , seen here as a referral from Raymond Green, from [facility].  Chief complaint according to patient : Raymond Green reports that about 10 years ago he had a sleep study at [facility] on [address], He has recently seen several of [facility] doctors including Raymond Green, and Raymond Green. Last year he underwent an evaluation for abdominal pain with Raymond Green. A cause of pain was never identified. He has been evaluated for rectal bleeding by Raymond Green. Lower left quadrant pain. He asked specifically for second opinion from Raymond Green. He has a chronically slight elevated creatinine level, he had a colonoscopy in April 2016 with a single 4 mm polyp removal. PSA was normal range, depression score was 0, he is up to date with all his vaccinations.  He has a history of sleep apnea diagnosed about 10 years ago but stopped using CPAP about 5 years ago he never liked sleeping with the mask very much. His wife has remarked upon his tendency to still snore loudly and to stop to breathe at night. He sometimes wakes up with a headache. He does not report waking up with a dry mouth.  Sleep habits are as follows: He usually goes to bed between 11:30 and 12 midnight, but has to rise again at 5:30 AM. He has nocturia about 3 times at night, but reports no trouble going back to sleep. He usually falls asleep on his side but when he wakes up he finds himself in the supine reposition. He sleeps on one pillow  only. His bedroom is dark, cool and quiet. He shares a bedroom with his wife. There are no pets in the household, no children. 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 His usual work hours are from 7 AM to 5:30 PM. He often falls asleep watching TV after returning home from work, but he does not schedule any naps. He has noted that he gets very drowsy during his lunch breaks. There is no possibility for him to use his lunch break for nap however. While  he wakes sometimes with a headache he has never been woken by headaches. He feels chronically sleep deprived. He tried to go to bed at 10 PM he won't sleep through the night.   Sleep medical history and family sleep history:  Raymond Green has been a loud snorer, In childhood he was snoring.   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     Interval history from 02/17/2016; see below patient has been diagnosed with severe sleep apnea and is expected to call his durable medical equipment company for supplies and the machine. His AHI was 48.5 and increased during REM sleep to 76.2. 9 cm water was his titration result. This may help HTN, nocturia ( 4-5 times at night ) .   Review of  Systems: Out of a complete 14 system review, the patient complains of only the following symptoms, and all other reviewed systems are negative. Shift worker , snoring, headaches,  Nocturia. Fatigue.   Epworth score  10, Fatigue severity score n/a   , depression score 2   Social History   Social History  . Marital status: Married    Spouse name: N/A  . Number of children: N/A  . Years of education: N/A   Occupational History  . Endoscopy Technician    Social History Main Topics  . Smoking status: Never Smoker  . Smokeless tobacco: Never Used  . Alcohol use No     Comment: Sometimes  . Drug use: No  . Sexual activity: Yes   Other Topics Concern  . Not on file    Social History Narrative   Married   Education: College   Exercise: Yes    Family History  Problem Relation Age of Onset  . Diabetes Mother   . Heart disease Mother   . Hypertension Mother   . Hypertension Raymond Green   . Diabetes Sister     Past Medical History:  Diagnosis Date  . Allergy   . Anemia   . Headache(784.0)   . Hemorrhoids   . Hypertension   . Kidney insufficiency   . LVH (left ventricular hypertrophy)    Raymond. Donnie Aho ( cardiology)  . Obesity   . OSA (obstructive sleep apnea)   . Positive PPD, treated 2000   INH  . Vitamin D deficiency     Past Surgical History:  Procedure Laterality Date  . COLONOSCOPY  12/28/2011   Procedure: COLONOSCOPY;  Surgeon: [name], Green;  Location: [facility];  Service: Endoscopy;  Laterality: N/A;  . HEMORRHOID SURGERY      Current Outpatient Prescriptions  Medication Sig Dispense Refill  . allopurinol (ZYLOPRIM) 100 MG tablet Take 1 tablet (100 mg total) by mouth daily. 30 tablet 6  . docusate sodium (COLACE) 100 MG capsule Take 100 mg by mouth daily.    . hydrochlorothiazide (MICROZIDE) 12.5 MG capsule Take 1 capsule (12.5 mg total) by mouth daily. 90 capsule 0  . losartan (COZAAR) 100 MG tablet Take 1 tablet (100 mg total) by mouth daily. 30 tablet 5  . Nebivolol HCl (BYSTOLIC) 20 MG TABS Take 1 tablet (20 mg total) by mouth 2 (two) times daily. 180 tablet 1  . omeprazole (PRILOSEC) 40 MG capsule Take 1 capsule (40 mg total) by mouth daily. 90 capsule 1   No current facility-administered medications for this visit.     Allergies as of 02/17/2016 - Review Complete 02/17/2016  Allergen Reaction Noted  . Lisinopril  08/25/2011    Vitals: BP 140/88   Pulse (!) 48   Resp 20   Ht 6' (1.829 m)   Wt 246 lb (111.6 kg)   BMI 33.36 kg/m  Last Weight:  Wt Readings from Last 1 Encounters:  02/17/16 246 lb (111.6 kg)   ZOX:WRUE mass index is 33.36 kg/m.     Last Height:   Ht Readings from Last 1 Encounters:   02/17/16 6' (1.829 m)    Physical exam:  General: The patient is awake, alert and appears not in acute distress. The patient is well groomed. Head: Normocephalic, atraumatic. Neck is supple. Mallampati  5,  neck circumference:17. Nasal airflow- right nasion is obstructed,site patent for airflow on the left , TMJ is not  evident . Retrognathia is seen.  Cardiovascular:  Regular rate and  rhythm, without  murmurs or carotid bruit, and without distended neck veins. Respiratory: Lungs are clear to auscultation. Skin:  Without evidence of edema, or rash Trunk: BMI is elevated . The patient's posture is erect .  Neurologic exam : The patient is awake and alert, oriented to place and time.   Speech is fluent,  without  dysarthria, dysphonia or aphasia.  Mood and affect are appropriate.  Cranial nerves: Pupils are equal and briskly reactive to light. Funduscopic exam without  evidence of pallor or edema. Extraocular movements  in vertical and horizontal planes intact and without nystagmus. Visual fields by finger perimetry are intact. Hearing to finger rub intact.   Facial sensation intact to fine touch.  Facial motor strength is symmetric and tongue and uvula move midline. Shoulder shrug was symmetrical.    The patient was advised of the nature of the diagnosed sleep disorder , the treatment options and risks for general a health and wellness arising from not treating the condition.  I spent more than 15 minutes of face to face time with the patient. Greater than 50% of time was spent in counseling and coordination of care. We have discussed the diagnosis and differential and I answered the patient's questions.     Assessment:  After physical and neurologic examination, review of laboratory studies,  Personal review of imaging studies, reports of other /same  Imaging studies ,  Results of polysomnography/ neurophysiology testing and pre-existing records as far as provided in visit., my  assessment is    1) Mr. Marrow underwent a sleep study on 11/11/2015 and was diagnosed with severe apnea at an AHI of 48.5, REM AHI 76.2, oxygen nadir of 81% but no significant prolonged desaturations. He also did not retain CO2. He was titrated to 8 cm water pressure with a resolution of the AHI down to 0.8. He was referred to  [employer] company, and was unable to return several phone calls because of his work schedule. I have asked him today to onto the phone call and get the new machine. Our next visit will only be scheduled after he has used the machine for at least 30 days. Sincerely,  Raymond. Jacklynn Ganong M.D.     Plan:  Treatment plan and additional workup :  Melvyn Novas Green  02/17/2016   CC: Raymond. [name]

## 2016-03-02 MED FILL — BYSTOLIC 20 MG TABLET: 20 | 90 days supply | Qty: 180 | Fill #1

## 2016-03-02 MED FILL — OMEPRAZOLE DR 40 MG CAPSULE: 40 | 90 days supply | Qty: 90 | Fill #1

## 2016-03-08 DIAGNOSIS — G4733 Obstructive sleep apnea (adult) (pediatric): Secondary | ICD-10-CM | POA: Diagnosis not present

## 2016-03-20 ENCOUNTER — Ambulatory Visit: Payer: 59 | Admitting: Adult Health

## 2016-03-22 MED FILL — LOSARTAN POTASSIUM 100 MG T: 100 | 90 days supply | Qty: 90 | Fill #1

## 2016-04-03 ENCOUNTER — Ambulatory Visit (INDEPENDENT_AMBULATORY_CARE_PROVIDER_SITE_OTHER): Payer: 59 | Admitting: Family Medicine

## 2016-04-03 VITALS — BP 120/70 | HR 52 | Temp 98.3°F | Resp 18 | Ht 72.0 in | Wt 246.5 lb

## 2016-04-03 DIAGNOSIS — R7989 Other specified abnormal findings of blood chemistry: Secondary | ICD-10-CM

## 2016-04-03 LAB — COMPLETE METABOLIC PANEL WITH GFR
ALBUMIN: 4.1 g/dL (ref 3.6–5.1)
ALT: 12 U/L (ref 9–46)
AST: 16 U/L (ref 10–40)
Alkaline Phosphatase: 46 U/L (ref 40–115)
BILIRUBIN TOTAL: 0.4 mg/dL (ref 0.2–1.2)
BUN: 21 mg/dL (ref 7–25)
CALCIUM: 10 mg/dL (ref 8.6–10.3)
CHLORIDE: 104 mmol/L (ref 98–110)
CO2: 29 mmol/L (ref 20–31)
CREATININE: 1.48 mg/dL — AB (ref 0.60–1.35)
GFR, EST AFRICAN AMERICAN: 63 mL/min (ref >=60)
GFR, Est Non African American: 55 mL/min — ABNORMAL LOW (ref >=60)
Glucose, Bld: 91 mg/dL (ref 65–99)
Potassium: 5 mmol/L (ref 3.5–5.3)
Sodium: 140 mmol/L (ref 135–146)
TOTAL PROTEIN: 7.1 g/dL (ref 6.1–8.1)

## 2016-04-03 LAB — POC MICROSCOPIC URINALYSIS (UMFC): Mucus: ABSENT

## 2016-04-03 LAB — POCT URINALYSIS DIP (MANUAL ENTRY)
BILIRUBIN UA: NEGATIVE
Blood, UA: NEGATIVE
Glucose, UA: NEGATIVE
Ketones, POC UA: NEGATIVE
LEUKOCYTES UA: NEGATIVE
NITRITE UA: NEGATIVE
PH UA: 6.5
PROTEIN UA: NEGATIVE
Spec Grav, UA: 1.01
Urobilinogen, UA: 0.2

## 2016-04-03 MED FILL — ALLOPURINOL 100 MG TABLET: 100 | 30 days supply | Qty: 30 | Fill #3

## 2016-04-03 NOTE — Patient Instructions (Addendum)
Will follow-up with you regarding lab results.  Continue taking blood pressure medications as prescribed.   IF you received an x-ray today, you will receive an invoice from [facility]. Please contact [facility] at [phone] with questions or concerns regarding your invoice.   IF you received labwork today, you will receive an invoice from [facility]. Please contact [facility] at [phone] with questions or concerns regarding your invoice.   Our billing staff will not be able to assist you with questions regarding bills from these companies.  You will be contacted with the lab results as soon as they are available. The fastest way to get your results is to activate your My Chart account. Instructions are located on the last page of this paperwork. If you have not heard from us regarding the results in 2 weeks, please contact this office.

## 2016-04-03 NOTE — Progress Notes (Signed)
Patient ID: Raymond Green, male    DOB: 26-Sep-1965, 50 y.o.   MRN: [id number]  PCP: Shade Flood, MD  Chief Complaint  Patient presents with  . Labs    Wants to have urine checked     Subjective:   HPI 50 year old presents for follow-up evaluation of kidney function following an elevated creatinine level in July, 2017. He is concerned regarding his kidney function and expresses that he would like to ensure that his kidney are working appropriately.  Denies urinary frequency, weak stream, or poor volume output. Reports a large amount of foam bubbles when urinating. Overall he reports good blood pressure control and denies any side effects of antihypertensive medications.  Denies headaches, dizziness, chest pain.   Social History   Social History  . Marital status: Married    Spouse name: N/A  . Number of children: N/A  . Years of education: N/A   Occupational History  . Endoscopy Technician    Social History Main Topics  . Smoking status: Never Smoker  . Smokeless tobacco: Never Used  . Alcohol use No     Comment: Sometimes  . Drug use: No  . Sexual activity: Yes   Other Topics Concern  . Not on file   Social History Narrative   Married   Education: College   Exercise: Yes    Family History  Problem Relation Age of Onset  . Diabetes Mother   . Heart disease Mother   . Hypertension Mother   . Hypertension Father   . Diabetes Sister    Review of Systems  See HPI   Patient Active Problem List   Diagnosis Date Noted  . Elevated serum creatinine 05/29/2015  . Gout 10/29/2013  . HYPERTENSION 03/02/2008  . GERD 03/02/2008  . ANEMIA-IRON DEFICIENCY 12/13/2007  . HEMORRHOIDS-EXTERNAL 12/13/2007     Prior to Admission medications   Medication Sig Start Date End Date Taking? Authorizing Provider  allopurinol (ZYLOPRIM) 100 MG tablet Take 1 tablet (100 mg total) by mouth daily. 11/01/15  Yes Peyton Najjar, MD  docusate sodium (COLACE) 100 MG  capsule Take 100 mg by mouth daily.   Yes Historical Provider, MD  hydrochlorothiazide (MICROZIDE) 12.5 MG capsule Take 1 capsule (12.5 mg total) by mouth daily. 01/13/16  Yes Shade Flood, MD  losartan (COZAAR) 100 MG tablet Take 1 tablet (100 mg total) by mouth daily. 09/01/15  Yes Shade Flood, MD  Nebivolol HCl (BYSTOLIC) 20 MG TABS Take 1 tablet (20 mg total) by mouth 2 (two) times daily. 09/01/15  Yes Shade Flood, MD  omeprazole (PRILOSEC) 40 MG capsule Take 1 capsule (40 mg total) by mouth daily. 09/01/15  Yes Shade Flood, MD    Allergies  Allergen Reactions  . Lisinopril     angioedema       Objective:  Physical Exam  Constitutional: He is oriented to person, place, and time. He appears well-developed and well-nourished.  HENT:  Head: Normocephalic and atraumatic.  Right Ear: External ear normal.  Left Ear: External ear normal.  Eyes: Conjunctivae and EOM are normal. Pupils are equal, round, and reactive to light.  Neck: Normal range of motion. Neck supple.  Cardiovascular: Normal rate, regular rhythm, normal heart sounds and intact distal pulses.   Pulmonary/Chest: Effort normal and breath sounds normal.  Musculoskeletal: Normal range of motion.  Neurological: He is alert and oriented to person, place, and time.  Skin: Skin is warm and dry.  Psychiatric:  He has a normal mood and affect. His behavior is normal. Judgment and thought content normal.    Vitals:   04/03/16 1215  BP: 120/70  Pulse: (!) 52  Resp: 18  Temp: 98.3 F (36.8 C)   Assessment & Plan:  1. Elevated serum creatinine - COMPLETE METABOLIC PANEL WITH GFR - POCT Microscopic Urinalysis ([facility]) - POCT urinalysis dipstick  Plan:  If creatinine remains the same or is significantly elevated, will refer to nephrology for further evaluation.  Godfrey PickKimberly S. Tiburcio PeaHarris, MSN, FNP-C [facility]

## 2016-04-10 ENCOUNTER — Ambulatory Visit: Payer: 59 | Admitting: Adult Health

## 2016-05-02 ENCOUNTER — Ambulatory Visit (INDEPENDENT_AMBULATORY_CARE_PROVIDER_SITE_OTHER): Payer: 59 | Admitting: Physician Assistant

## 2016-05-02 ENCOUNTER — Encounter: Payer: Self-pay | Admitting: Physician Assistant

## 2016-05-02 VITALS — BP 136/90 | HR 60 | Ht 72.0 in | Wt 238.4 lb

## 2016-05-02 DIAGNOSIS — R109 Unspecified abdominal pain: Secondary | ICD-10-CM

## 2016-05-02 DIAGNOSIS — K5909 Other constipation: Secondary | ICD-10-CM | POA: Diagnosis not present

## 2016-05-02 NOTE — Patient Instructions (Addendum)
  We got an appointment with [facility], [address]  Dr. Antoine PrimasZachary Smith. 05-17-2016 at 2:15 PM.  Arrive at 2:00 PM for a 2:15 PM.   Bring your insurance card, list of medications.    Follow up with Dr. Rob Buntinganiel Jacobs as needed.

## 2016-05-02 NOTE — Progress Notes (Signed)
I agree with the above note, plan 

## 2016-05-02 NOTE — Progress Notes (Signed)
Chief Complaint: Left sided abdominal pain  HPI:  [name] is a 50 year old African-American male with a past medical history of hemorrhoids, hypertension, obstructive sleep apnea and vitamin D deficiency,  who was referred to me by Shade FloodGreene, Jeffrey R, MD for a complaint of ongoing left-sided abdominal pain. The patient has followed with Dr. Christella HartiganJacobs in the past, most recently for a screening colonoscopy on 12/28/11. This was normal other than a small external hemorrhoid.    According to chart review patient had an EGD performed 10/14/14 by Dr. Madilyn FiremanHayes at [facility] which showed a small hiatal hernia, gastric ulcer and gastritis. Patient was started on Omeprazole 40mg  qd. Colonoscopy performed 09/24/14 revealed a 4 mm polyp in the proximal ascending colon and internal hemorrhoids. Repeat was recommended in 5 years. Patient also had an MRI of the abdomen with and without contrast on 01/19/15 which showed no explanation for left upper quadrant pain and was unremarkable. CT of the abdomen and pelvis with contrast on 10/19/14 showed no findings to explain the patient's left lower quadrant pain and a small periumbilical hernia which contained fat. Patient had recent labs completed 04/03/16 which included a CMP revealing a slightly elevated creatinine at 1.48, which is actually improved over the past 3 months.   Today, the patient tells me that he has been experiencing this pain for a year now. He describes that he only feels this pain after going to sleep at night, it will wake him 2-3 hours later if he is laying on his left side. He describes this as a sharp pain which radiates up from his left hip area into his abdomen and around to his back. At that time he will get up and sit by the side of his bed for a while until this pain goes away, he will then get back in bed and sleep on his right side through the rest of the night. Patient has had a negative workup including EGD, colonoscopy, MRI and CT over the past year. The  patient tells me that he has also had a change towards constipation over the past year or more for which he uses a daily Colace. He still tells me he has to strain at least one day a week and will occasionally see some bright red blood with this when wiping. He does have past history of a hemorrhoidectomy in 2013. He denies any changes in activity level or specific activities, exercise, medications, diet or daily routine over the past year since this pain started.   Patient denies fever, chills, melena, weight loss, fatigue, anorexia, nausea, vomiting, heartburn, reflux, dysphagia or increasing gas or bloating.  Past Medical History:  Diagnosis Date  . Allergy   . Anemia   . Headache(784.0)   . Hemorrhoids   . Hypertension   . Kidney insufficiency   . LVH (left ventricular hypertrophy)    Dr. Donnie Ahoilley ( cardiology)  . Obesity   . OSA (obstructive sleep apnea)   . Positive PPD, treated 2000   INH  . Vitamin D deficiency     Past Surgical History:  Procedure Laterality Date  . COLONOSCOPY  12/28/2011   Procedure: COLONOSCOPY;  Surgeon: Rachael Feeaniel P Jacobs, MD;  Location: [facility];  Service: Endoscopy;  Laterality: N/A;  . HEMORRHOID SURGERY      Current Outpatient Prescriptions  Medication Sig Dispense Refill  . allopurinol (ZYLOPRIM) 100 MG tablet Take 1 tablet (100 mg total) by mouth daily. 30 tablet 6  . docusate sodium (COLACE)  100 MG capsule Take 100 mg by mouth daily.    . hydrochlorothiazide (MICROZIDE) 12.5 MG capsule Take 1 capsule (12.5 mg total) by mouth daily. 90 capsule 0  . losartan (COZAAR) 100 MG tablet Take 1 tablet (100 mg total) by mouth daily. 30 tablet 5  . Nebivolol HCl (BYSTOLIC) 20 MG TABS Take 1 tablet (20 mg total) by mouth 2 (two) times daily. 180 tablet 1  . omeprazole (PRILOSEC) 40 MG capsule Take 1 capsule (40 mg total) by mouth daily. 90 capsule 1   No current facility-administered medications for this visit.     Allergies as of 05/02/2016 - Review  Complete 05/02/2016  Allergen Reaction Noted  . Lisinopril  08/25/2011  . Shrimp [shellfish allergy] Nausea And Vomiting 05/02/2016    Family History  Problem Relation Age of Onset  . Diabetes Mother   . Heart disease Mother   . Hypertension Mother   . Hypertension Father   . Diabetes Sister     Social History   Social History  . Marital status: Married    Spouse name: N/A  . Number of children: N/A  . Years of education: N/A   Occupational History  . Endoscopy Technician    Social History Main Topics  . Smoking status: Never Smoker  . Smokeless tobacco: Never Used  . Alcohol use No     Comment: Sometimes  . Drug use: No  . Sexual activity: Yes   Other Topics Concern  . Not on file   Social History Narrative   Married   Education: Automotive engineer   Exercise: Yes    Review of Systems:     Constitutional: No weight loss, fever, chills, weakness or fatigue HEENT: Eyes: No change in vision               Ears, Nose, Throat:  No change in hearing or congestion Skin: No rash or itching Cardiovascular: No chest pain, chest pressure or palpitations   Respiratory: No SOB or cough Gastrointestinal: See HPI and otherwise negative Genitourinary: No dysuria or change in urinary frequency Neurological: No headache, dizziness or syncope Musculoskeletal: No new muscle or joint pain Hematologic: No bleeding or bruising Psychiatric: No history of depression or anxiety   Physical Exam:  Vital signs: BP 136/90 (BP Location: Left Arm, Patient Position: Sitting, Cuff Size: Normal)   Pulse 60   Ht 6' (1.829 m)   Wt 238 lb 6.4 oz (108.1 kg)   BMI 32.33 kg/m   General:   Pleasant African-American male appears to be in NAD, Well developed, Well nourished, alert and cooperative Head:  Normocephalic and atraumatic. Eyes:   PEERL, EOMI. No icterus. Conjunctiva pink. Ears:  Normal auditory acuity. Neck:  Supple Throat: Oral cavity and pharynx without inflammation, swelling or  lesion. Lungs: Respirations even and unlabored. Lungs clear to auscultation bilaterally.   No wheezes, crackles, or rhonchi.  Heart: Normal S1, S2. No MRG. Regular rate and rhythm. No peripheral edema, cyanosis or pallor.  Abdomen:  Soft, nondistended, nontender. No rebound or guarding. Normal bowel sounds. No appreciable masses or hepatomegaly. Rectal:  Not performed.  Msk:  Symmetrical without gross deformities. Peripheral pulses intact.  Extremities:  Without edema, no deformity or joint abnormality. Normal ROM Neurologic:  Alert and  oriented x4;  grossly normal neurologically.  Skin:   Dry and intact without significant lesions or rashes. Psychiatric: Oriented to person, place and time. Demonstrates good judgement and reason without abnormal affect or behaviors.  Most recent LABS  AND IMAGING: CBC    Component Value Date/Time   WBC 4.9 01/02/2015 1312   WBC 4.4 12/26/2013 1104   RBC 4.76 01/02/2015 1312   RBC 4.59 12/26/2013 1104   HGB 13.7 (A) 01/02/2015 1312   HGB 13.6 12/26/2013 1104   HCT 40.9 (A) 01/02/2015 1312   HCT 39.9 12/26/2013 1104   PLT 159 12/26/2013 1104   MCV 85.9 01/02/2015 1312   MCH 28.8 01/02/2015 1312   MCH 29.6 12/26/2013 1104   MCHC 33.5 01/02/2015 1312   MCHC 34.1 12/26/2013 1104   RDW 14.1 12/26/2013 1104   LYMPHSABS 2.2 12/26/2013 1104   MONOABS 0.2 12/26/2013 1104   EOSABS 0.1 12/26/2013 1104   BASOSABS 0.0 12/26/2013 1104    CMP     Component Value Date/Time   NA 140 04/03/2016 1319   K 5.0 04/03/2016 1319   CL 104 04/03/2016 1319   CO2 29 04/03/2016 1319   GLUCOSE 91 04/03/2016 1319   BUN 21 04/03/2016 1319   CREATININE 1.48 (H) 04/03/2016 1319   CALCIUM 10.0 04/03/2016 1319   PROT 7.1 04/03/2016 1319   ALBUMIN 4.1 04/03/2016 1319   AST 16 04/03/2016 1319   ALT 12 04/03/2016 1319   ALKPHOS 46 04/03/2016 1319   BILITOT 0.4 04/03/2016 1319   GFRNONAA 55 (L) 04/03/2016 1319   GFRAA 63 04/03/2016 1319   EXAM: CT ABDOMEN AND PELVIS  WITH CONTRAST 10/19/14  TECHNIQUE: Multidetector CT imaging of the abdomen and pelvis was performed using the standard protocol following bolus administration of intravenous contrast.  CONTRAST:  100 cc Isovue-300.  COMPARISON:  None.  FINDINGS: Lower chest: Lung bases show no acute findings. Heart is enlarged. No pericardial or pleural effusion.  Hepatobiliary: Liver and gallbladder are unremarkable. No biliary ductal dilatation.  Pancreas: Negative.  Spleen: Negative.  Adrenals/Urinary Tract: Adrenal glands are unremarkable. 1.1 cm low-attenuation lesion in the lower pole right kidney is likely a cyst although definitive characterization is difficult due to size and lack of postcontrast imaging. Kidneys are otherwise unremarkable. Ureters are decompressed. Bladder is low in volume, limiting evaluation.  Stomach/Bowel: Stomach, small bowel and colon are unremarkable. Appendix is not readily visualized.  Vascular/Lymphatic: Vascular structures are unremarkable. No pathologically enlarged lymph nodes.  Reproductive: Prostate is visualized.  Other: No free fluid. Mesenteries and peritoneum are unremarkable. Small periumbilical hernia contains fat.  Musculoskeletal: No worrisome lytic or sclerotic lesions.  IMPRESSION: 1. No findings to explain the patient's left lower quadrant pain. 2. Small periumbilical hernia contains fat.   Electronically Signed   By: Leanna Battles M.D.   On: 10/19/2014 10:30  EXAM: MRI ABDOMEN WITHOUT AND WITH CONTRAST 01/19/15  TECHNIQUE: Multiplanar multisequence MR imaging of the abdomen was performed both before and after the administration of intravenous contrast.  CONTRAST:  20mL MULTIHANCE GADOBENATE DIMEGLUMINE 529 MG/ML IV SOLN  COMPARISON:  CT 10/19/2014  FINDINGS: Lower chest:  Lung bases are clear.  Hepatobiliary: No focal hepatic lesion. Enhancing hepatic no biliary duct dilatation the gallbladder is  normal. The common bile duct normal.  Pancreas: Normal pancreatic parenchymal intensity. No ductal dilatation or inflammation.  Spleen: Normal spleen.  Adrenals/urinary tract: Adrenal glands and kidneys are normal.  Stomach/Bowel: Stomach and limited of the small bowel is unremarkable  Vascular/Lymphatic: Abdominal aortic normal caliber. No retroperitoneal periportal lymphadenopathy.  Musculoskeletal: No aggressive osseous lesion .  Incidental note: Patient vomited 45 seconds after contrast injection  IMPRESSION:  1. No explanation for LEFT upper quadrant pain. 2. Unremarkable MRI  of the abdomen.   Electronically Signed   By: Genevive BiStewart  Edmunds M.D.   On: 01/20/2015 09:26   Assessment: 1. Left sided abdominal pain: Per patient this started over a year ago, he has had evaluations including EGD, colonoscopy, CT and MRI of the abdomen as well as lab work since that time, this has all been normal. This pain wakes him from his sleep about 2-3 hours after he goes to bed if he is laying on his left side. Once this wakes him up he sits up for a little while and goes back to sleep on his right side and is able to sleep through the rest of the night. Discussed with the patient that likely this is musculoskeletal in origin, with extensive workup in the past, do not believe this is GI related. Consider sciatic nerve vs referred back pain? 2. Constipation: Patient has at least one day of constipation every week even on Colace once daily, this is a change over the past couple of years, sometimes sees some bright red blood with wiping on days of constipation; likely hemorrhoidal bleeding  Plan: 1. Recommend patient continue Colace daily and add MiraLAX. We did discuss that he can use this up to 4 times a day in order to have regular bowel movements. If he continues to see bright red blood or this increases in amount or frequency and is not related to constipation, would recommend that  he have further evaluation with possible repeat colonoscopy 2. Referred the patient to sports medicine doctor for further evaluation of possible sciatic pain versus referred nerve pain from his back or hip? Would patient benefit from physical therapy? 3. Patient to follow in clinic as needed in the future with Dr. Philis FendtJacobs  [name], PA-C [facility] 05/02/2016, 8:41 AM  Cc: Shade FloodGreene, Jeffrey R, MD

## 2016-05-16 NOTE — Progress Notes (Signed)
Tawana ScaleZach Keane Green D.O. [facility] [address] [place], [place] [address] Phone: [phone] Subjective:    I'm seeing this patient by the request  of:  Shade FloodGREENE,JEFFREY R, MD Raymond MeekerJennifer Lemmon PA  CC: left side pain    OZH:[id number]:Subjective  Raymond FountainGuillaume A Green is a 50 y.o. male coming in with complaint of Left sided abdominal and side pain. Patient has had this for quite some time. Patient has been seen by gastroenterology recently. Patient was referred there for further evaluation. Patient did have an EGD performed on 10/14/2014 that did show a small hiatal hernia as well as gastric ulcer and gastritis. Patient was treated for the ulcer with omeprazole. Patient continued have pain and has even had an evaluation with MRI of the abdomen with and without contrast on 01/19/2015 that was fairly unremarkable for any signs that would give pain. Patient has had also a CT of the abdomen in May 2016 showing a very small. Umbilical hernia. Patient continues to have pain though on the left sign. Seems worse when laying down sometimes. Can wake him up out of sleep. Patient has been told he does have constipation and is treating this with MiraLAX and Colace but he unfortunately continues to have discomfort. Patient does not remember any true injury in the area. Patient states  He has not notice any significant improvement. Still seems to be worse only at night at this time. Not as much severe today with activity. States that it does not have been immediately when he lays down but it takes approximately 1 hour. Only happens when he sleeps.     Past Medical History:  Diagnosis Date  . Allergy   . Anemia   . Headache(784.0)   . Hemorrhoids   . Hypertension   . Kidney insufficiency   . LVH (left ventricular hypertrophy)    Dr. Donnie Ahoilley ( cardiology)  . Obesity   . OSA (obstructive sleep apnea)   . Positive PPD, treated 2000   INH  . Vitamin D deficiency    Past Surgical History:  Procedure Laterality  Date  . COLONOSCOPY  12/28/2011   Procedure: COLONOSCOPY;  Surgeon: Rachael Feeaniel P Jacobs, MD;  Location: [facility];  Service: Endoscopy;  Laterality: N/A;  . HEMORRHOID SURGERY     Social History   Social History  . Marital status: Married    Spouse name: N/A  . Number of children: N/A  . Years of education: N/A   Occupational History  . Endoscopy Technician    Social History Main Topics  . Smoking status: Never Smoker  . Smokeless tobacco: Never Used  . Alcohol use No     Comment: Sometimes  . Drug use: No  . Sexual activity: Yes   Other Topics Concern  . None   Social History Narrative   Married   Education: Automotive engineerCollege   Exercise: Yes   Allergies  Allergen Reactions  . Lisinopril     angioedema  . Shrimp [Shellfish Allergy] Nausea And Vomiting   Family History  Problem Relation Age of Onset  . Diabetes Mother   . Heart disease Mother   . Hypertension Mother   . Hypertension Father   . Diabetes Sister     Past medical history, social, surgical and family history all reviewed in electronic medical record.  No pertanent information unless stated regarding to the chief complaint.   Review of Systems:Review of systems updated and as accurate as of 05/17/16  No headache, visual changes, nausea, vomiting, diarrhea,  constipation, dizziness, abdominal pain, skin rash, fevers, chills, night sweats, weight loss, swollen lymph nodes, body aches, joint swelling, muscle aches, chest pain, shortness of breath, mood changes.   Objective  Blood pressure 134/82, pulse (!) 50, height 6' (1.829 m), weight 237 lb (107.5 kg). Systems examined below as of 05/17/16   General: No apparent distress alert and oriented x3 mood and affect normal, dressed appropriately.  HEENT: Pupils equal, extraocular movements intact Patient does have paleness of the conjunctiva Respiratory: Patient's speak in full sentences and does not appear short of breath  Cardiovascular: No lower extremity edema,  non tender, no erythema  Skin: Warm dry intact with no signs of infection or rash on extremities or on axial skeleton.  Abdomen: Soft nontender No hernias palpated. Bowel sounds positive in all 4 quadrants. Neuro: Cranial nerves II through XII are intact, neurovascularly intact in all extremities with 2+ DTRs and 2+ pulses.  Lymph: No lymphadenopathy of posterior or anterior cervical chain or axillae bilaterally.  Gait normal with good balance and coordination.  MSK:  Non tender with full range of motion and good stability and symmetric strength and tone of shoulders, elbows, wrist, hip, knee and ankles bilaterally.  Back Exam:  Inspection: Unremarkable  Motion: Flexion 45 deg, Extension 15 deg mild tenderness, Side Bending to 35 deg bilaterally,  Rotation to 35 deg bilaterally  SLR laying: Negative  XSLR laying: Negative  Palpable tenderness: Nontender on exam. FABER: negative does have significant tightness bilaterally. Sensory change: Gross sensation intact to all lumbar and sacral dermatomes.  Reflexes: 2+ at both patellar tendons, 2+ at achilles tendons, Babinski's downgoing.  Strength at foot  Plantar-flexion: 5/5 Dorsi-flexion: 5/5 Eversion: 5/5 Inversion: 5/5  Leg strength  Quad: 5/5 Hamstring: 5/5 Hip flexor: 5/5 Hip abductors: 4/5 but symmetric Gait unremarkable.    Impression and Recommendations:     This case required medical decision making of moderate complexity.      Note: This dictation was prepared with Dragon dictation along with smaller phrase technology. Any transcriptional errors that result from this process are unintentional.

## 2016-05-17 ENCOUNTER — Encounter: Payer: Self-pay | Admitting: Family Medicine

## 2016-05-17 ENCOUNTER — Ambulatory Visit (INDEPENDENT_AMBULATORY_CARE_PROVIDER_SITE_OTHER)
Admission: RE | Admit: 2016-05-17 | Discharge: 2016-05-17 | Disposition: A | Discharge status: Home / Self Care | Payer: 59 | Source: Ambulatory Visit | Attending: Family Medicine | Admitting: Family Medicine

## 2016-05-17 ENCOUNTER — Ambulatory Visit (INDEPENDENT_AMBULATORY_CARE_PROVIDER_SITE_OTHER): Payer: 59 | Admitting: Family Medicine

## 2016-05-17 ENCOUNTER — Other Ambulatory Visit (INDEPENDENT_AMBULATORY_CARE_PROVIDER_SITE_OTHER): Payer: 59

## 2016-05-17 VITALS — BP 134/82 | HR 50 | Ht 72.0 in | Wt 237.0 lb

## 2016-05-17 DIAGNOSIS — R1013 Epigastric pain: Secondary | ICD-10-CM

## 2016-05-17 DIAGNOSIS — R1032 Left lower quadrant pain: Secondary | ICD-10-CM

## 2016-05-17 DIAGNOSIS — D509 Iron deficiency anemia, unspecified: Secondary | ICD-10-CM

## 2016-05-17 DIAGNOSIS — M545 Low back pain, unspecified: Secondary | ICD-10-CM

## 2016-05-17 LAB — CBC WITH DIFFERENTIAL/PLATELET
Basophils Absolute: 0 10*3/uL (ref 0.0–0.1)
Basophils Relative: 1.1 % (ref 0.0–3.0)
EOS PCT: 2.7 % (ref 0.0–5.0)
Eosinophils Absolute: 0.1 10*3/uL (ref 0.0–0.7)
HCT: 36.6 % — ABNORMAL LOW (ref 39.0–52.0)
Hemoglobin: 12.4 g/dL — ABNORMAL LOW (ref 13.0–17.0)
LYMPHS ABS: 1.7 10*3/uL (ref 0.7–4.0)
Lymphocytes Relative: 40.5 % (ref 12.0–46.0)
MCHC: 34 g/dL (ref 30.0–36.0)
MCV: 88.2 fl (ref 78.0–100.0)
MONO ABS: 0.5 10*3/uL (ref 0.1–1.0)
Monocytes Relative: 11.7 % (ref 3.0–12.0)
NEUTROS ABS: 1.9 10*3/uL (ref 1.4–7.7)
NEUTROS PCT: 44 % (ref 43.0–77.0)
PLATELETS: 158 10*3/uL (ref 150.0–400.0)
RBC: 4.15 Mil/uL — AB (ref 4.22–5.81)
RDW: 14.3 % (ref 11.5–15.5)
WBC: 4.3 10*3/uL (ref 4.0–10.5)

## 2016-05-17 LAB — FERRITIN: FERRITIN: 32.9 ng/mL (ref 22.0–322.0)

## 2016-05-17 LAB — TSH: TSH: 1.14 u[IU]/mL (ref 0.35–4.50)

## 2016-05-17 LAB — MAGNESIUM: Magnesium: 2.2 mg/dL (ref 1.5–2.5)

## 2016-05-17 LAB — VITAMIN D 25 HYDROXY (VIT D DEFICIENCY, FRACTURES): VITD: 19.47 ng/mL — ABNORMAL LOW (ref 30.00–100.00)

## 2016-05-17 NOTE — Assessment & Plan Note (Signed)
Patient's pain seems to be more exacerbated at night. Seems to be when he is laying on his back. We discussed the possibility of a lumbar radiculopathy but that's usually worse with activity. Spinal stenosis is within the differential but would be the aspects of the lumbar spine which I think will be unlikely. X-rays ordered today to further evaluate. Patient does have a history of sleep apnea there is a potential for patient having also iron deficiency that could cause potential muscle cramping at night. Discussed with patient at great length. Patient is going to have labs done today to further evaluate patient's iron deficiency as well as other laboratory workup for further possibility for cramping. I do not see any significant musculoskeletal component otherwise at this times a no home exercises given. We discussed depending on laboratory findings this can change medical management and we will discuss changes and likely do a trial of treatment for 3 weeks before further evaluation.

## 2016-05-17 NOTE — Patient Instructions (Signed)
Good to see you  I think this could be muscle cramping.  We will get xray of your back and labs downstairs Vitamin C 500mg  with iron containing meals.  Vitamin D 2000 IU daily  I will send you a message when I get the labs back  See me again in 4 weeks.  Happy holidays!

## 2016-05-17 NOTE — Assessment & Plan Note (Signed)
History of iron deficiency previously. Has had difficult he with constipation though with supplementation. We discussed how vitamin C can indirectly increase iron with regular meals. Patient will try this and we will get labs to further evaluate.

## 2016-06-19 NOTE — Progress Notes (Signed)
[facility] [address] [place], [place] [address] Phone: [phone] Subjective:     CC: left side pain f/u   BJY:NWGNFAOZHY  Raymond Green is a 51 y.o. male coming in with complaint of Left sided abdominal and side pain. Patient has had this for quite some time. Patient has been seen by gastroenterology recently. Patient was referred there for further evaluation. Patient did have an EGD performed on 10/14/2014 that did show a small hiatal hernia as well as gastric ulcer and gastritis. Patient was treated for the ulcer with omeprazole. Patient continued have pain and has even had an evaluation with MRI of the abdomen with and without contrast on 01/19/2015 that was fairly unremarkable for any signs that would give pain. Patient has had also a CT of the abdomen in May 2016 showing a very small. Umbilical hernia. Patient continues to have pain though on the left sign.   Patient seen by me one month ago. Seems to have more painful at night when he is laying on his back for possible lumbar radiculopathy or facet arthritis that could be contributing patient was to start over-the-counter medications. Patient states No improvement at this time. Still no pain with regular daily activities. Only has pain when he lays down. States that it takes about one hour of laying down and then unfortunately has severe amount of pain that seems to last minutes. States that is concerning because it is so severe that wakes him up and needs scared to fall back asleep. Denies any fevers chills     Patient did have laboratory workup. Laboratory workup didn't show patient having a anemia as well as low vitamin D Patient did have x-rays. X-rays the patient's lumbar spine show mild degenerative changes of the low low back especially at L5-S1.  Past Medical History:  Diagnosis Date  . Allergy   . Anemia   . Headache(784.0)   . Hemorrhoids   . Hypertension   . Kidney insufficiency   .  LVH (left ventricular hypertrophy)    Dr. Donnie Aho ( cardiology)  . Obesity   . OSA (obstructive sleep apnea)   . Positive PPD, treated 2000   INH  . Vitamin D deficiency    Past Surgical History:  Procedure Laterality Date  . COLONOSCOPY  12/28/2011   Procedure: COLONOSCOPY;  Surgeon: [name], MD;  Location: [facility];  Service: Endoscopy;  Laterality: N/A;  . HEMORRHOID SURGERY     Social History   Social History  . Marital status: Married    Spouse name: N/A  . Number of children: N/A  . Years of education: N/A   Occupational History  . Endoscopy Technician    Social History Main Topics  . Smoking status: Never Smoker  . Smokeless tobacco: Never Used  . Alcohol use No     Comment: Sometimes  . Drug use: No  . Sexual activity: Yes   Other Topics Concern  . None   Social History Narrative   Married   Education: Automotive engineer   Exercise: Yes   Allergies  Allergen Reactions  . Lisinopril     angioedema  . Shrimp [Shellfish Allergy] Nausea And Vomiting   Family History  Problem Relation Age of Onset  . Diabetes Mother   . Heart disease Mother   . Hypertension Mother   . Hypertension Father   . Diabetes Sister     Past medical history, social, surgical and family history all reviewed in  electronic medical record.  No pertanent information unless stated regarding to the chief complaint.   Review of Systems: No headache, visual changes, nausea, vomiting, diarrhea, constipation, dizziness, skin rash, fevers, chills, night sweats, weight loss, swollen lymph nodes, body aches, joint swelling, muscle aches, chest pain, shortness of breath, mood changes.  Marland Kitchen. Positive for abdominal pain   Objective  Blood pressure (!) 142/76, pulse (!) 44, height 6' (1.829 m), weight 242 lb (109.8 kg), SpO2 99 %.   Systems examined below as of 06/20/16 General: NAD A&O x3 mood, affect normal  HEENT: Pupils equal, extraocular movements intact no nystagmus Respiratory: not  short of breath at rest or with speaking Cardiovascular: No lower extremity edema, non tender Skin: Warm dry intact with no signs of infection or rash on extremities or on axial skeleton. Abdomen: Soft nontender, no masses bowel sounds positive in all 4 quadrants. No rebound tenderness. Neuro: Cranial nerves  intact, neurovascularly intact in all extremities with 2+ DTRs and 2+ pulses. Lymph: No lymphadenopathy appreciated today  Gait normal with good balance and coordination.  MSK: Non tender with full range of motion and good stability and symmetric strength and tone of shoulders, elbows, wrist,  knee hips and ankles bilaterally.   Back Exam:  Inspection: Unremarkable  Motion: Flexion 45 deg, Extension 15 deg mild tenderness still noted Side Bending to 35 deg bilaterally,  Rotation to 35 deg bilaterally  SLR laying: Negative  XSLR laying: Negative  Palpable tenderness: Nontender on exam. FABER: negative does have significant tightness bilaterally. Sensory change: Gross sensation intact to all lumbar and sacral dermatomes.  Reflexes: 2+ at both patellar tendons, 2+ at achilles tendons, Babinski's downgoing.  Strength at foot  Plantar-flexion: 5/5 Dorsi-flexion: 5/5 Eversion: 5/5 Inversion: 5/5  Leg strength  Quad: 5/5 Hamstring: 5/5 Hip flexor: 5/5 Hip abductors: 4+/5 but symmetric Gait unremarkable.    Impression and Recommendations:     This case required medical decision making of moderate complexity.      Note: This dictation was prepared with Dragon dictation along with smaller phrase technology. Any transcriptional errors that result from this process are unintentional.

## 2016-06-20 ENCOUNTER — Ambulatory Visit (INDEPENDENT_AMBULATORY_CARE_PROVIDER_SITE_OTHER): Payer: 59 | Admitting: Family Medicine

## 2016-06-20 ENCOUNTER — Encounter: Payer: Self-pay | Admitting: Family Medicine

## 2016-06-20 DIAGNOSIS — R1032 Left lower quadrant pain: Secondary | ICD-10-CM | POA: Diagnosis not present

## 2016-06-20 MED ORDER — AMLODIPINE BESYLATE 5 MG PO TABS: 5.0000 mg | ORAL_TABLET | Freq: Every day | ORAL | 3 refills | Status: DC

## 2016-06-20 MED ORDER — VITAMIN D (ERGOCALCIFEROL) 1.25 MG (50000 UNIT) PO CAPS: 50000.0000 [IU] | ORAL_CAPSULE | ORAL | 0 refills | Status: DC

## 2016-06-20 MED FILL — VIT D2 1.25 MG (50,000 UNIT: 1.25 MG | 84 days supply | Qty: 12 | Fill #0

## 2016-06-20 MED FILL — AMLODIPINE BESYLATE 5 MG TA: 5 | 90 days supply | Qty: 90 | Fill #0

## 2016-06-20 NOTE — Patient Instructions (Addendum)
Good to see you  Happy New Year! I would like to make a couple changes.  Stop the hydrochlorothiazide.  Start amlodipine 5 mg daily  I want to change you to a once weekly vitamin D for next 12 weeks to see if this would help with muscle strength.  I also think we should try an over the counter DHEA 50 mg daily to help with muscle endurance.  See me again in 3 weeks and if not better we will get MRI of your back.

## 2016-06-20 NOTE — Assessment & Plan Note (Signed)
Continues having this pain. Still only when he lays down and 9 after approximately 1 hour. Possible some of his facet arthropathy could be treating. Low hemoglobin and possible mesenteric ischemia but patient does not have any pain when he does some working out. Discuss again status. Patient has had a fairly significant workup but I do feel an MRI of the back will be necessary if he does not respond. Change patient's hydrochlorothiazide in case this is contributing with some dehydration or increasing uric acid reabsorption. Will be started on amlodipine. We'll discuss with primary care provider. We discussed icing regimen. Patient will try him also once weekly vitamin D with him having a low vitamin D level on last exam. Follow-up again in 3 weeks.  Spent  25 minutes with patient face-to-face and had greater than 50% of counseling including as described above in assessment and plan.

## 2016-07-11 ENCOUNTER — Ambulatory Visit: Payer: 59 | Admitting: Family Medicine

## 2016-07-13 ENCOUNTER — Encounter: Payer: Self-pay | Admitting: Family Medicine

## 2016-07-13 ENCOUNTER — Ambulatory Visit (INDEPENDENT_AMBULATORY_CARE_PROVIDER_SITE_OTHER): Payer: 59 | Admitting: Family Medicine

## 2016-07-13 VITALS — BP 126/80 | HR 50 | Ht 72.0 in | Wt 237.0 lb

## 2016-07-13 DIAGNOSIS — M545 Low back pain, unspecified: Secondary | ICD-10-CM | POA: Insufficient documentation

## 2016-07-13 DIAGNOSIS — G8929 Other chronic pain: Secondary | ICD-10-CM

## 2016-07-13 DIAGNOSIS — M5416 Radiculopathy, lumbar region: Secondary | ICD-10-CM | POA: Diagnosis not present

## 2016-07-13 DIAGNOSIS — R1032 Left lower quadrant pain: Secondary | ICD-10-CM

## 2016-07-13 DIAGNOSIS — M5442 Lumbago with sciatica, left side: Secondary | ICD-10-CM | POA: Diagnosis not present

## 2016-07-13 NOTE — Assessment & Plan Note (Signed)
Patient is had this abdominal pain for quite some time. Low back pain and now is having radicular symptoms. I'm concerned the patient's abdominal pain could be referred pain from the lumbar spine as well. X-rays have been ordered and did show some mild arthritic changes. Spent  25 minutes with patient face-to-face and had greater than 50% of counseling including as described above in assessment and plan.

## 2016-07-13 NOTE — Assessment & Plan Note (Addendum)
As stated above. 

## 2016-07-13 NOTE — Progress Notes (Signed)
Raymond Green D.O. [facility] [address] [place], [place] [address] Phone: [phone] Subjective:     CC: left side pain f/u   BJY:NWGNFAOZHYHPI:Subjective  Raymond Green is a 51 y.o. male coming in with complaint of Left sided abdominal and side pain. Patient has had this for quite some time. Patient has been seen by gastroenterology recently. Patient was referred there for further evaluation. Patient did have an EGD performed on 10/14/2014 that did show a small hiatal hernia as well as gastric ulcer and gastritis. Patient was treated for the ulcer with omeprazole. Patient continued have pain and has even had an evaluation with MRI of the abdomen with and without contrast on 01/19/2015 that was fairly unremarkable for any signs that would give pain. Patient has had also a CT of the abdomen in May 2016 showing a very small. Umbilical hernia. Patient continues to have pain though on the left sign.   Patient seen by me one month ago. Seems to have more painful at night when he is laying on his back for possible lumbar radiculopathy or facet arthritis that could be contributing patient was to start over-the-counter medications. Patient was also started on once weekly vitamin D, icing protocol. We did change patient's hypertension medicine in case this is causing and increasing uric acid negative been continuing some cramping in aiding his dehydration. Started on amlodipine. States that he has not had any side effects to the medications. Patient states No significant improvement at this time. Continues to have unfortunately the same discomfort. States that it even woke him up at night. Now radiating down his leg somewhat. Patient states that I'll numbness for one day in the leg but then it seemed to resolve. Patient states it seems to be worsening. Now associated with a little more back pain that is had previously.     Patient did have laboratory workup. Laboratory workup didn't show patient having  a anemia as well as low vitamin D Patient did have x-rays. X-rays the patient's lumbar spine show mild degenerative changes of the low low back especially at L5-S1.  Past Medical History:  Diagnosis Date  . Allergy   . Anemia   . Headache(784.0)   . Hemorrhoids   . Hypertension   . Kidney insufficiency   . LVH (left ventricular hypertrophy)    Dr. Donnie Ahoilley ( cardiology)  . Obesity   . OSA (obstructive sleep apnea)   . Positive PPD, treated 2000   INH  . Vitamin D deficiency    Past Surgical History:  Procedure Laterality Date  . COLONOSCOPY  12/28/2011   Procedure: COLONOSCOPY;  Surgeon: Rachael Feeaniel P Jacobs, MD;  Location: [facility];  Service: Endoscopy;  Laterality: N/A;  . HEMORRHOID SURGERY     Social History   Social History  . Marital status: Married    Spouse name: N/A  . Number of children: N/A  . Years of education: N/A   Occupational History  . Endoscopy Technician    Social History Main Topics  . Smoking status: Never Smoker  . Smokeless tobacco: Never Used  . Alcohol use No     Comment: Sometimes  . Drug use: No  . Sexual activity: Yes   Other Topics Concern  . None   Social History Narrative   Married   Education: Automotive engineerCollege   Exercise: Yes   Allergies  Allergen Reactions  . Lisinopril     angioedema  . Shrimp [Shellfish Allergy] Nausea And Vomiting  Family History  Problem Relation Age of Onset  . Diabetes Mother   . Heart disease Mother   . Hypertension Mother   . Hypertension Father   . Diabetes Sister     Past medical history, social, surgical and family history all reviewed in electronic medical record.  No pertanent information unless stated regarding to the chief complaint.   Review of Systems: No headache, visual changes, nausea, vomiting, diarrhea, constipation, dizziness,  skin rash, fevers, chills, night sweats, weight loss, swollen lymph nodes chest pain, shortness of breath, mood changes.   Marland Kitchen Positive for abdominal painAlso  positive for increased muscle aches   Objective  Blood pressure 126/80, pulse (!) 50, height 6' (1.829 m), weight 237 lb (107.5 kg), SpO2 100 %.   Systems examined below as of 07/13/16 General: NAD A&O x3 mood, affect normal  HEENT: Pupils equal, extraocular movements intact no nystagmus Respiratory: not short of breath at rest or with speaking Cardiovascular: No lower extremity edema, non tender Skin: Warm dry intact with no signs of infection or rash on extremities or on axial skeleton. Abdomen: Soft nontender, no masses bowel sounds positive in all 4 quadrants. No rebound tenderness. Neuro: Cranial nerves  intact, neurovascularly intact in all extremities with 2+ DTRs and 2+ pulses. Lymph: No lymphadenopathy appreciated today  Gait normal with good balance and coordination.  MSK: Non tender with full range of motion and good stability and symmetric strength and tone of shoulders, elbows, wrist,  knee hips and ankles bilaterally.   Back Exam:  Inspection: Unremarkable  Motion: Flexion 25 deg, Extension 15 deg mild tenderness still noted Side Bending to 30 deg bilaterally,  Rotation to 30 deg bilaterally mild worsening symptoms from previous exam SLR laying: Negative but increasing tightness. XSLR laying: Negative  Palpable tenderness: Increasing tenderness in the paraspinal musculature on the left side of the lumbar spine FABER: negative does have significant tightness bilaterally. Sensory change: Gross sensation intact to all lumbar and sacral dermatomes.  Reflexes: 2+ at both patellar tendons, 2+ at achilles tendons, Babinski's downgoing.  Strength at foot  Plantar-flexion: 5/5 Dorsi-flexion: 5/5 Eversion: 5/5 Inversion: 5/5  Leg strength  Quad: 5/5 Hamstring: 5/5 Hip flexor: 5/5 Hip abductors: 4+/5 but symmetric Gait unremarkable.    Impression and Recommendations:     This case required medical decision making of moderate complexity.      Note: This dictation was  prepared with Dragon dictation along with smaller phrase technology. Any transcriptional errors that result from this process are unintentional.

## 2016-07-13 NOTE — Patient Instructions (Addendum)
Good to see you  I am sorry you are not better.  Lets get MRi of your back and pelvis.  Lets see what we find and go from there.  I will write you and give you choices.

## 2016-07-28 MED FILL — ALLOPURINOL 100 MG TABLET: 100 | 30 days supply | Qty: 30 | Fill #4

## 2016-08-01 ENCOUNTER — Encounter: Payer: Self-pay | Admitting: Family Medicine

## 2016-08-13 ENCOUNTER — Other Ambulatory Visit: Payer: 59

## 2016-08-17 ENCOUNTER — Telehealth: Payer: Self-pay | Admitting: Family Medicine

## 2016-08-17 NOTE — Telephone Encounter (Signed)
Raymond Green from [facility] stating clarification on MRI we ordered on 07/13/16. Need more information on location of pain (ex. Sacrum). Please give her a call. He has an appointment on Sunday, 08/20/16. Colon Brancharson

## 2016-08-17 NOTE — Telephone Encounter (Signed)
Spoke to Raymond Green & clarified Dr. Katrinka BlazingSmith is wanting sacrum.

## 2016-08-20 ENCOUNTER — Ambulatory Visit
Admission: RE | Admit: 2016-08-20 | Discharge: 2016-08-20 | Disposition: A | Discharge status: Home / Self Care | Payer: 59 | Source: Ambulatory Visit | Attending: Family Medicine | Admitting: Family Medicine

## 2016-08-20 DIAGNOSIS — M5416 Radiculopathy, lumbar region: Secondary | ICD-10-CM

## 2016-08-20 DIAGNOSIS — M160 Bilateral primary osteoarthritis of hip: Secondary | ICD-10-CM | POA: Diagnosis not present

## 2016-08-20 DIAGNOSIS — M48061 Spinal stenosis, lumbar region without neurogenic claudication: Secondary | ICD-10-CM | POA: Diagnosis not present

## 2016-09-04 ENCOUNTER — Other Ambulatory Visit: Payer: Self-pay | Admitting: Family Medicine

## 2016-09-04 DIAGNOSIS — K219 Gastro-esophageal reflux disease without esophagitis: Secondary | ICD-10-CM

## 2016-10-03 MED FILL — OMEPRAZOLE DR 40 MG CAPSULE: 40 | 90 days supply | Qty: 90 | Fill #0

## 2016-10-10 ENCOUNTER — Ambulatory Visit (INDEPENDENT_AMBULATORY_CARE_PROVIDER_SITE_OTHER): Payer: 59 | Admitting: Physician Assistant

## 2016-10-10 VITALS — BP 152/94 | HR 55 | Temp 97.4°F | Resp 18 | Ht 72.0 in | Wt 242.0 lb

## 2016-10-10 DIAGNOSIS — R7989 Other specified abnormal findings of blood chemistry: Secondary | ICD-10-CM | POA: Diagnosis not present

## 2016-10-10 DIAGNOSIS — J301 Allergic rhinitis due to pollen: Secondary | ICD-10-CM | POA: Diagnosis not present

## 2016-10-10 LAB — POCT URINALYSIS DIP (MANUAL ENTRY)
Bilirubin, UA: NEGATIVE
Blood, UA: NEGATIVE
Glucose, UA: NEGATIVE mg/dL
Ketones, POC UA: NEGATIVE mg/dL
LEUKOCYTES UA: NEGATIVE
NITRITE UA: NEGATIVE
PH UA: 6 (ref 5.0–8.0)
PROTEIN UA: NEGATIVE mg/dL
Spec Grav, UA: >=1.03 — AB (ref 1.010–1.025)
UROBILINOGEN UA: 0.2 U/dL

## 2016-10-10 LAB — POC MICROSCOPIC URINALYSIS (UMFC): MUCUS RE: ABSENT

## 2016-10-10 MED ORDER — CETIRIZINE HCL 10 MG PO TABS: 10.0000 mg | ORAL_TABLET | Freq: Every day | ORAL | 3 refills | Status: DC

## 2016-10-10 MED ORDER — FLUTICASONE PROPIONATE 50 MCG/ACT NA SUSP: 2.0000 | Freq: Every day | NASAL | 0 refills | Status: DC

## 2016-10-10 MED FILL — FLUTICASONE PROP 50 MCG SPR: 50 | 30 days supply | Qty: 16 | Fill #0

## 2016-10-10 NOTE — Patient Instructions (Signed)
For nasal congestion, I would like you to start using flonase and zyrtec daily. The flonase takes a couple of weeks to reach maximum benefit. If you are not having improvement in 5-7 days please return to clinic, if your symptoms worsen, return sooner.  For your creatinine, I will contact you in a couple of days and let you know what the value is. If it is still elevated, I will send in a referral to nephrologist. In the meantime, continue to avoid NSAIDs. Make sure you are staying hydrated with at least 64 oz of water a day. Also, take your bp medication daily. Your bp goal is <140/90.   Allergic Rhinitis Allergic rhinitis is when the mucous membranes in the nose respond to allergens. Allergens are particles in the air that cause your body to have an allergic reaction. This causes you to release allergic antibodies. Through a chain of events, these eventually cause you to release histamine into the blood stream. Although meant to protect the body, it is this release of histamine that causes your discomfort, such as frequent sneezing, congestion, and an itchy, runny nose. What are the causes? Seasonal allergic rhinitis (hay fever) is caused by pollen allergens that may come from grasses, trees, and weeds. Year-round allergic rhinitis (perennial allergic rhinitis) is caused by allergens such as house dust mites, pet dander, and mold spores. What are the signs or symptoms?  Nasal stuffiness (congestion).  Itchy, runny nose with sneezing and tearing of the eyes. How is this diagnosed? Your health care provider can help you determine the allergen or allergens that trigger your symptoms. If you and your health care provider are unable to determine the allergen, skin or blood testing may be used. Your health care provider will diagnose your condition after taking your health history and performing a physical exam. Your health care provider may assess you for other related conditions, such as asthma, pink  eye, or an ear infection. How is this treated? Allergic rhinitis does not have a cure, but it can be controlled by:  Medicines that block allergy symptoms. These may include allergy shots, nasal sprays, and oral antihistamines.  Avoiding the allergen. Hay fever may often be treated with antihistamines in pill or nasal spray forms. Antihistamines block the effects of histamine. There are over-the-counter medicines that may help with nasal congestion and swelling around the eyes. Check with your health care provider before taking or giving this medicine. If avoiding the allergen or the medicine prescribed do not work, there are many new medicines your health care provider can prescribe. Stronger medicine may be used if initial measures are ineffective. Desensitizing injections can be used if medicine and avoidance does not work. Desensitization is when a patient is given ongoing shots until the body becomes less sensitive to the allergen. Make sure you follow up with your health care provider if problems continue. Follow these instructions at home: It is not possible to completely avoid allergens, but you can reduce your symptoms by taking steps to limit your exposure to them. It helps to know exactly what you are allergic to so that you can avoid your specific triggers. Contact a health care provider if:  You have a fever.  You develop a cough that does not stop easily (persistent).  You have shortness of breath.  You start wheezing.  Symptoms interfere with normal daily activities. This information is not intended to replace advice given to you by your health care provider. Make sure you discuss any questions  you have with your health care provider. Document Released: 02/28/2001 Document Revised: 02/04/2016 Document Reviewed: 02/10/2013 Elsevier Interactive Patient Education  2017 [employer].

## 2016-10-10 NOTE — Progress Notes (Signed)
MRN: [id number] DOB: 1965/08/28  Subjective:   Raymond Green is a 51 y.o. male presenting for chief complaint of Sore Throat (x 1week ) and Nasal Congestion .  Reports 1 week history of nasal congestion, rhinorrhea and scratchy throat. Has associated sneezing and itchy watery eyes . Has tried robitussin and cough drops for mild relief. Denies fever, sinus pain, ear pain, sore throat, wheezing, shortness of breath, chest tightness, chest pain, myalgia and cough, night sweats, fatigue, nausea, vomiting, abdominal pain and diarrhea. Has not had sick contact with anyone. Has history of seasonal allergies especially to pollen but has not taken any medication this Spring. No history of asthma. Denies smoking and alcohol use.   Pt would also like to repeat his CMP to evaluate his creatinine. Notes he has been told in the past that he has elevated creatinine.He endorses nocturia (wakes up 3-4 times a night), foam bubbles when urinating, and yellow urine color. Denies oliguria,hematuria, urinary frequency, dysuria, and urinary hesitancy. His last creatinine was 1.48 on 04/03/16. He has never seen a nephrologist for this. He drinks at least 64 oz of water a day. Denies excessive use of NSAIDs. Has hx HTN. No hx of diabetes.   Fuad has a current medication list which includes the following prescription(s): allopurinol, amlodipine, docusate sodium, losartan, nebivolol hcl, omeprazole, vitamin d (ergocalciferol), cetirizine, and fluticasone. Also is allergic to lisinopril and shrimp [shellfish allergy].  Diago  has a past medical history of Allergy; Anemia; Headache(784.0); Hemorrhoids; Hypertension; Kidney insufficiency; LVH (left ventricular hypertrophy); Obesity; OSA (obstructive sleep apnea); Positive PPD, treated (2000); and Vitamin D deficiency. Also  has a past surgical history that includes Hemorrhoid surgery and Colonoscopy (12/28/2011).   Objective:   Vitals: BP (!) 152/94   Pulse  (!) 55   Temp 97.4 F (36.3 C) (Oral)   Resp 18   Ht 6' (1.829 m)   Wt 242 lb (109.8 kg)   SpO2 100%   BMI 32.82 kg/m   Physical Exam  Constitutional: He is oriented to person, place, and time. He appears well-developed and well-nourished.  HENT:  Head: Normocephalic and atraumatic.  Right Ear: Tympanic membrane, external ear and ear canal normal.  Left Ear: Tympanic membrane, external ear and ear canal normal.  Nose: Mucosal edema (prominent on left side, mild on right side) present. Right sinus exhibits no maxillary sinus tenderness and no frontal sinus tenderness. Left sinus exhibits no maxillary sinus tenderness and no frontal sinus tenderness.  Mouth/Throat: Uvula is midline and mucous membranes are normal. Posterior oropharyngeal erythema present.  Eyes: Conjunctivae are normal.  Neck: Normal range of motion.  Cardiovascular: Normal rate, regular rhythm and normal heart sounds.   Pulmonary/Chest: Effort normal and breath sounds normal. He has no wheezes. He has no rales.  Abdominal: There is no CVA tenderness.  Lymphadenopathy:       Head (right side): No submental, no submandibular, no tonsillar, no preauricular, no posterior auricular and no occipital adenopathy present.       Head (left side): No submental, no submandibular, no tonsillar, no preauricular, no posterior auricular and no occipital adenopathy present.    He has no cervical adenopathy.       Right: No supraclavicular adenopathy present.       Left: No supraclavicular adenopathy present.  Neurological: He is alert and oriented to person, place, and time.  Skin: Skin is warm and dry.  Psychiatric: He has a normal mood and affect.  Vitals reviewed.  Results  for orders placed or performed in visit on 10/10/16 (from the past 24 hour(s))  POCT urinalysis dipstick     Status: Abnormal   Collection Time: 10/10/16  4:34 PM  Result Value Ref Range   Color, UA yellow yellow   Clarity, UA clear clear   Glucose, UA  negative negative mg/dL   Bilirubin, UA negative negative   Ketones, POC UA negative negative mg/dL   Spec Grav, UA >=1.030 (A) 1.010 - 1.025   Blood, UA negative negative   pH, UA 6.0 5.0 - 8.0   Protein Ur, POC negative negative mg/dL   Urobilinogen, UA 0.2 0.2 or 1.0 E.U./dL   Nitrite, UA Negative Negative   Leukocytes, UA Negative Negative  POCT Microscopic Urinalysis ([facility])     Status: Abnormal   Collection Time: 10/10/16  4:44 PM  Result Value Ref Range   WBC,UR,HPF,POC None None WBC/hpf   RBC,UR,HPF,POC None None RBC/hpf   Bacteria None None, Too numerous to count   Mucus Absent Absent   Epithelial Cells, UR Per Microscopy Few (A) None, Too numerous to count cells/hpf    Assessment and Plan :  1. Elevated serum creatinine Urine suggests dehydration. Pt instructed to consume at least 64 oz of water daily, avoid NSAIDs, and take bp medication daily. CMP pending, if creatinine remains elevated, consider referral to nephrologist.  - POCT Microscopic Urinalysis ([facility]) - POCT urinalysis dipstick - CMP14+EGFR  2. Allergic rhinitis due to pollen, unspecified seasonality Hx and PE findings suggestive of allergic rhinitis. Given educational info on allergic rhinitis. Instructed to return to clinic if symptoms worsen, do not improve in 5-7 days, or as needed - fluticasone (FLONASE) 50 MCG/ACT nasal spray; Place 2 sprays into both nostrils daily.  Dispense: 16 g; Refill: 0 - cetirizine (ZYRTEC) 10 MG tablet; Take 1 tablet (10 mg total) by mouth daily.  Dispense: 30 tablet; Refill: [place], PA-C  [facility] 10/10/2016 4:48 PM

## 2016-10-11 LAB — CMP14+EGFR
A/G RATIO: 1.6 (ref 1.2–2.2)
ALT: 15 IU/L (ref 0–44)
AST: 21 IU/L (ref 0–40)
Albumin: 4.2 g/dL (ref 3.5–5.5)
Alkaline Phosphatase: 50 IU/L (ref 39–117)
BUN / CREAT RATIO: 13 (ref 9–20)
BUN: 19 mg/dL (ref 6–24)
Bilirubin Total: 0.3 mg/dL (ref 0.0–1.2)
CALCIUM: 9.8 mg/dL (ref 8.7–10.2)
CO2: 27 mmol/L (ref 18–29)
Chloride: 101 mmol/L (ref 96–106)
Creatinine, Ser: 1.45 mg/dL — ABNORMAL HIGH (ref 0.76–1.27)
GFR calc Af Amer: 64 mL/min/{1.73_m2} (ref >59)
GFR, EST NON AFRICAN AMERICAN: 56 mL/min/{1.73_m2} — AB (ref >59)
GLOBULIN, TOTAL: 2.7 g/dL (ref 1.5–4.5)
Glucose: 81 mg/dL (ref 65–99)
POTASSIUM: 4.2 mmol/L (ref 3.5–5.2)
SODIUM: 140 mmol/L (ref 134–144)
Total Protein: 6.9 g/dL (ref 6.0–8.5)

## 2016-10-13 ENCOUNTER — Other Ambulatory Visit: Payer: Self-pay | Admitting: Physician Assistant

## 2016-10-13 DIAGNOSIS — R7989 Other specified abnormal findings of blood chemistry: Secondary | ICD-10-CM

## 2016-10-17 ENCOUNTER — Encounter: Payer: Self-pay | Admitting: Physician Assistant

## 2016-10-18 ENCOUNTER — Encounter: Payer: Self-pay | Admitting: Physician Assistant

## 2016-10-18 ENCOUNTER — Ambulatory Visit (INDEPENDENT_AMBULATORY_CARE_PROVIDER_SITE_OTHER): Payer: 59 | Admitting: Physician Assistant

## 2016-10-18 VITALS — BP 158/86 | HR 52 | Temp 98.6°F | Resp 18 | Ht 72.0 in | Wt 242.4 lb

## 2016-10-18 DIAGNOSIS — M79662 Pain in left lower leg: Secondary | ICD-10-CM

## 2016-10-18 DIAGNOSIS — I10 Essential (primary) hypertension: Secondary | ICD-10-CM

## 2016-10-18 NOTE — Progress Notes (Signed)
Subjective:     Raymond Green is a 51 y.o. male who presents for evaluation of pain of left calf only when he is ambulating for the past 2 weeks. Pain is relieved with rest.  Will occasionally have crmaping senstaion in bilateral legs at night time.  He has not had similar problems in the past. The patient is able to ambulate. Denies acute injury,redness, warmth, swelling, numbness, tingling, itching, or pain in the buttocks region. Has no history of cancer or previous DVT, no recent injury, immobilization, or long term travel.  Risk factors for hypercoagulable state include: obesity. No FH of DVTs. Denies smoking.    Review of Systems Constitutional: negative for chills, fatigue and fevers Respiratory: negative for cough, dyspnea on exertion, pleurisy/chest pain and wheezing Cardiovascular: negative for chest pain, irregular heart beat, lower extremity edema and palpitations Musculoskeletal:negative for back pain, muscle weakness and myalgias     Allergies as of 10/18/2016      Reactions   Lisinopril    angioedema   Shrimp [shellfish Allergy] Nausea And Vomiting      Medication List       Accurate as of 10/18/16  5:25 PM. Always use your most recent med list.          allopurinol 100 MG tablet Commonly known as:  ZYLOPRIM Take 1 tablet (100 mg total) by mouth daily.   amLODipine 5 MG tablet Commonly known as:  NORVASC Take 1 tablet (5 mg total) by mouth daily.   cetirizine 10 MG tablet Commonly known as:  ZYRTEC Take 1 tablet (10 mg total) by mouth daily.   docusate sodium 100 MG capsule Commonly known as:  COLACE Take 100 mg by mouth daily.   fluticasone 50 MCG/ACT nasal spray Commonly known as:  FLONASE Place 2 sprays into both nostrils daily.   losartan 100 MG tablet Commonly known as:  COZAAR Take 1 tablet (100 mg total) by mouth daily.   Nebivolol HCl 20 MG Tabs Commonly known as:  BYSTOLIC Take 1 tablet (20 mg total) by mouth 2 (two) times daily.    omeprazole 40 MG capsule Commonly known as:  PRILOSEC TAKE 1 CAPSULE BY MOUTH DAILY.   Vitamin D (Ergocalciferol) 50000 units Caps capsule Commonly known as:  DRISDOL Take 1 capsule (50,000 Units total) by mouth every 7 (seven) days.       Past Medical History:  Diagnosis Date  . Allergy   . Anemia   . Headache(784.0)   . Hemorrhoids   . Hypertension   . Kidney insufficiency   . LVH (left ventricular hypertrophy)    Dr. Donnie Aho ( cardiology)  . Obesity   . OSA (obstructive sleep apnea)   . Positive PPD, treated 2000   INH  . Vitamin D deficiency    Past Surgical History:  Procedure Laterality Date  . COLONOSCOPY  12/28/2011   Procedure: COLONOSCOPY;  Surgeon: [name], MD;  Location: [facility];  Service: Endoscopy;  Laterality: N/A;  . HEMORRHOID SURGERY     Social History   Social History  . Marital status: Married    Spouse name: N/A  . Number of children: N/A  . Years of education: N/A   Occupational History  . Endoscopy Technician    Social History Main Topics  . Smoking status: Never Smoker  . Smokeless tobacco: Never Used  . Alcohol use No     Comment: Sometimes  . Drug use: No  . Sexual activity: Yes   Other  Topics Concern  . Not on file   Social History Narrative   Married   Education: College   Exercise: Yes      Objective:   Today's Vitals   10/18/16 1557  BP: (!) 158/86  Pulse: (!) 52  Resp: 18  Temp: 98.6 F (37 C)  TempSrc: Oral  SpO2: 99%  Weight: 242 lb 6.4 oz (110 kg)  Height: 6' (1.829 m)      Physical Exam  Constitutional: He is oriented to person, place, and time. He appears well-developed and well-nourished. No distress.  HENT:  Head: Normocephalic and atraumatic.  Eyes: Conjunctivae are normal.  Neck: Normal range of motion.  Cardiovascular: Normal rate, regular rhythm, normal heart sounds and intact distal pulses.   Pulses:      Radial pulses are 2+ on the right side, and 2+ on the left side.        Popliteal pulses are 2+ on the right side, and 2+ on the left side.       Dorsalis pedis pulses are 2+ on the right side, and 2+ on the left side.       Posterior tibial pulses are 2+ on the right side, and 2+ on the left side.  Pulmonary/Chest: Effort normal and breath sounds normal. He has no decreased breath sounds.  Musculoskeletal:       Right lower leg: He exhibits no tenderness and no swelling.       Left lower leg: He exhibits no tenderness and no swelling.  Neurological: He is alert and oriented to person, place, and time.  Skin: Skin is warm and dry.  Small area of palpable tortuous vein noted on posterior aspect of bilateral lower legs Bilateral lower extremities appear shiny and hairless. No pain with palpation of lower extremities. No erythema, warmth, or swelling of lower legs noted. Calf measures 44cm bilaterally.    Psychiatric: He has a normal mood and affect.  Vitals reviewed.   BP Readings from Last 3 Encounters:  10/18/16 (!) 158/86  10/10/16 (!) 152/94  07/13/16 126/80   Wells' Score of -2.  Assessment and Plan:  This case was precepted with Dr. Creta Levin.  1. Pain of left lower leg No concerning findings on physical exam. Hx and PE findings suggestive of intermittent claudication.  Labs pending. Will evaluate for high cholesterol and diabetes. Given strict ED/return precautions.  - Ambulatory referral to Vascular Surgery - Lipid panel; Future - Hemoglobin A1c; Future  2. Essential hypertension Uncontrolled in office. Pt is asymptomatic. Discussed dietary modifications in detail with patient. Also pt was given educational material on DASH diet.   Benjiman Core, PA-C  Primary [facility] at [facility] 10/18/2016 5:14 PM

## 2016-10-18 NOTE — Patient Instructions (Addendum)
Your physical exam is not consistent with a blood clot, which is great news. Your pain seems more consistent with claudication, which can be related to peripheral artery disease. Therefore, I think you should be evaluated by vascular surgery. I have a placed a referral and you should hear from them in 1-2 weeks. Please let me know if they have not called you in 10 days. Also, I would like to check your cholesterol and A1C, please return tomorrow for lab only when you are fasting. Once I have those results, our office will contact you. I have also given you some information on DASH diet which is a great diet to follow when you have high blood pressure. Really focus on your diet and try to start increasing exercise as much as you can. This will help with better control of your high blood pressure.   Worrisome symptoms for a blood clot would include pain that is not relieved with rest, swelling, redness, and warmth to the touch. If you develop any of these symptoms or any other concerning symptoms like chest pain, shortness of breath, or tachycardia please seek care immediately. Thank you for letting me participate in your health and well being.    Intermittent Claudication Intermittent claudication is pain in your leg that occurs when you walk or exercise and goes away when you rest. The pain can occur in one or both legs. What are the causes? Intermittent claudication is caused by the buildup of plaque within the major arteries in the body (atherosclerosis). The plaque, which makes arteries stiff and narrow, prevents enough blood from reaching your leg muscles. The pain occurs when you walk or exercise because your muscles need more blood when you are moving and exercising. What increases the risk? Risk factors include:  A family history of atherosclerosis.  A personal history of stroke or heart disease.  Older age.  Being inactive or overweight.  Smoking cigarettes.  Having another health  condition such as:  Diabetes.  High blood pressure.  High cholesterol. What are the signs or symptoms? Your hip or leg may:  Ache.  Cramp.  Feel tight.  Feel weak.  Feel heavy. Over time, you may feel pain in your calf, thigh, or hip. How is this diagnosed? Your health care provider may diagnose intermittent claudication based on your symptoms and medical history. Your health care provider may also do tests to learn more about your condition. These may include:  Blood tests.  An ultrasound.  Imaging tests such as angiography, magnetic resonance angiography (MRA), and computed tomography angiography (CTA). How is this treated? You may be treated for problems such as:  High blood pressure.  High cholesterol.  Diabetes. Other treatments may include:  Lifestyle changes such as:  Starting an exercise program.  Losing weight.  Quitting smoking.  Medicines to help restore blood flow through your legs.  Blood vessel surgery (angioplasty) to restore blood flow if your intermittent claudication is caused by severe peripheral artery disease. Follow these instructions at home:  Manage any other health conditions you have.  Eat a diet low in saturated fats and calories to maintain a healthy weight.  Quit smoking, if you smoke.  Take medicines only as directed by your health care provider.  If your health care provider recommended an exercise program for you, follow it as directed. Your exercise program may involve:  Walking three or more times a week.  Walking until you have certain symptoms of intermittent claudication.  Resting until symptoms  go away.  Gradually increasing walking time to about 50 minutes a day. Contact a health care provider if: Your condition is not getting better or is getting worse. Get help right away if:  You have chest pain.  You have difficulty breathing.  You develop arm weakness.  You have trouble speaking.  Your face  begins to droop. This information is not intended to replace advice given to you by your health care provider. Make sure you discuss any questions you have with your health care provider. Document Released: 04/07/2004 Document Revised: 11/11/2015 Document Reviewed: 09/11/2013 Elsevier Interactive Patient Education  2017 Elsevier Inc.   DASH Eating Plan DASH stands for "Dietary Approaches to Stop Hypertension." The DASH eating plan is a healthy eating plan that has been shown to reduce high blood pressure (hypertension). It may also reduce your risk for type 2 diabetes, heart disease, and stroke. The DASH eating plan may also help with weight loss. What are tips for following this plan? General guidelines   Avoid eating more than 2,300 mg (milligrams) of salt (sodium) a day. If you have hypertension, you may need to reduce your sodium intake to 1,500 mg a day.  Limit alcohol intake to no more than 1 drink a day for nonpregnant women and 2 drinks a day for men. One drink equals 12 oz of beer, 5 oz of wine, or 1 oz of hard liquor.  Work with your health care provider to maintain a healthy body weight or to lose weight. Ask what an ideal weight is for you.  Get at least 30 minutes of exercise that causes your heart to beat faster (aerobic exercise) most days of the week. Activities may include walking, swimming, or biking.  Work with your health care provider or diet and nutrition specialist (dietitian) to adjust your eating plan to your individual calorie needs. Reading food labels   Check food labels for the amount of sodium per serving. Choose foods with less than 5 percent of the Daily Value of sodium. Generally, foods with less than 300 mg of sodium per serving fit into this eating plan.  To find whole grains, look for the word "whole" as the first word in the ingredient list. Shopping   Buy products labeled as "low-sodium" or "no salt added."  Buy fresh foods. Avoid canned foods and  premade or frozen meals. Cooking   Avoid adding salt when cooking. Use salt-free seasonings or herbs instead of table salt or sea salt. Check with your health care provider or pharmacist before using salt substitutes.  Do not fry foods. Cook foods using healthy methods such as baking, boiling, grilling, and broiling instead.  Cook with heart-healthy oils, such as olive, canola, soybean, or sunflower oil. Meal planning    Eat a balanced diet that includes:  5 or more servings of fruits and vegetables each day. At each meal, try to fill half of your plate with fruits and vegetables.  Up to 6-8 servings of whole grains each day.  Less than 6 oz of lean meat, poultry, or fish each day. A 3-oz serving of meat is about the same size as a deck of cards. One egg equals 1 oz.  2 servings of low-fat dairy each day.  A serving of nuts, seeds, or beans 5 times each week.  Heart-healthy fats. Healthy fats called Omega-3 fatty acids are found in foods such as flaxseeds and coldwater fish, like sardines, salmon, and mackerel.  Limit how much you eat of the  following:  Canned or prepackaged foods.  Food that is high in trans fat, such as fried foods.  Food that is high in saturated fat, such as fatty meat.  Sweets, desserts, sugary drinks, and other foods with added sugar.  Full-fat dairy products.  Do not salt foods before eating.  Try to eat at least 2 vegetarian meals each week.  Eat more home-cooked food and less restaurant, buffet, and fast food.  When eating at a restaurant, ask that your food be prepared with less salt or no salt, if possible. What foods are recommended? The items listed may not be a complete list. Talk with your dietitian about what dietary choices are best for you. Grains  Whole-grain or whole-wheat bread. Whole-grain or whole-wheat pasta. Brown rice. Orpah Cobb. Bulgur. Whole-grain and low-sodium cereals. Pita bread. Low-fat, low-sodium crackers.  Whole-wheat flour tortillas. Vegetables  Fresh or frozen vegetables (raw, steamed, roasted, or grilled). Low-sodium or reduced-sodium tomato and vegetable juice. Low-sodium or reduced-sodium tomato sauce and tomato paste. Low-sodium or reduced-sodium canned vegetables. Fruits  All fresh, dried, or frozen fruit. Canned fruit in natural juice (without added sugar). Meat and other protein foods  Skinless chicken or [place]. Ground chicken or [place]. Pork with fat trimmed off. Fish and seafood. Egg whites. Dried beans, peas, or lentils. Unsalted nuts, nut butters, and seeds. Unsalted canned beans. Lean cuts of beef with fat trimmed off. Low-sodium, lean deli meat. Dairy  Low-fat (1%) or fat-free (skim) milk. Fat-free, low-fat, or reduced-fat cheeses. Nonfat, low-sodium ricotta or cottage cheese. Low-fat or nonfat yogurt. Low-fat, low-sodium cheese. Fats and oils  Soft margarine without trans fats. Vegetable oil. Low-fat, reduced-fat, or light mayonnaise and salad dressings (reduced-sodium). Canola, safflower, olive, soybean, and sunflower oils. Avocado. Seasoning and other foods  Herbs. Spices. Seasoning mixes without salt. Unsalted popcorn and pretzels. Fat-free sweets. What foods are not recommended? The items listed may not be a complete list. Talk with your dietitian about what dietary choices are best for you. Grains  Baked goods made with fat, such as croissants, muffins, or some breads. Dry pasta or rice meal packs. Vegetables  Creamed or fried vegetables. Vegetables in a cheese sauce. Regular canned vegetables (not low-sodium or reduced-sodium). Regular canned tomato sauce and paste (not low-sodium or reduced-sodium). Regular tomato and vegetable juice (not low-sodium or reduced-sodium). Rosita Fire. Olives. Fruits  Canned fruit in a light or heavy syrup. Fried fruit. Fruit in cream or butter sauce. Meat and other protein foods  Fatty cuts of meat. Ribs. Fried meat. Tomasa Blase. Sausage. Bologna and  other processed lunch meats. Salami. Fatback. Hotdogs. Bratwurst. Salted nuts and seeds. Canned beans with added salt. Canned or smoked fish. Whole eggs or egg yolks. Chicken or [place] with skin. Dairy  Whole or 2% milk, cream, and half-and-half. Whole or full-fat cream cheese. Whole-fat or sweetened yogurt. Full-fat cheese. Nondairy creamers. Whipped toppings. Processed cheese and cheese spreads. Fats and oils  Butter. Stick margarine. Lard. Shortening. Ghee. Bacon fat. Tropical oils, such as coconut, palm kernel, or palm oil. Seasoning and other foods  Salted popcorn and pretzels. Onion salt, garlic salt, seasoned salt, table salt, and sea salt. Worcestershire sauce. Tartar sauce. Barbecue sauce. Teriyaki sauce. Soy sauce, including reduced-sodium. Steak sauce. Canned and packaged gravies. Fish sauce. Oyster sauce. Cocktail sauce. Horseradish that you find on the shelf. Ketchup. Mustard. Meat flavorings and tenderizers. Bouillon cubes. Hot sauce and Tabasco sauce. Premade or packaged marinades. Premade or packaged taco seasonings. Relishes. Regular salad dressings. Where to find more  information:  National Heart, Lung, and Blood Institute: PopSteam.is  American Heart Association: www.heart.org Summary  The DASH eating plan is a healthy eating plan that has been shown to reduce high blood pressure (hypertension). It may also reduce your risk for type 2 diabetes, heart disease, and stroke.  With the DASH eating plan, you should limit salt (sodium) intake to 2,300 mg a day. If you have hypertension, you may need to reduce your sodium intake to 1,500 mg a day.  When on the DASH eating plan, aim to eat more fresh fruits and vegetables, whole grains, lean proteins, low-fat dairy, and heart-healthy fats.  Work with your health care provider or diet and nutrition specialist (dietitian) to adjust your eating plan to your individual calorie needs. This information is not intended to replace advice  given to you by your health care provider. Make sure you discuss any questions you have with your health care provider. Document Released: 05/25/2011 Document Revised: 05/29/2016 Document Reviewed: 05/29/2016 Elsevier Interactive Patient Education  2017 [employer].  IF you received an x-ray today, you will receive an invoice from [facility]. Please contact [facility] at [phone] with questions or concerns regarding your invoice.   IF you received labwork today, you will receive an invoice from [place]. Please contact [facility] at [phone] with questions or concerns regarding your invoice.   Our billing staff will not be able to assist you with questions regarding bills from these companies.  You will be contacted with the lab results as soon as they are available. The fastest way to get your results is to activate your My Chart account. Instructions are located on the last page of this paperwork. If you have not heard from [place] regarding the results in 2 weeks, please contact this office.

## 2016-10-19 ENCOUNTER — Other Ambulatory Visit (INDEPENDENT_AMBULATORY_CARE_PROVIDER_SITE_OTHER): Payer: 59 | Admitting: Family Medicine

## 2016-10-19 DIAGNOSIS — M79662 Pain in left lower leg: Secondary | ICD-10-CM

## 2016-10-19 NOTE — Progress Notes (Signed)
Patient not examined - here for labs only

## 2016-10-20 LAB — LIPID PANEL
CHOL/HDL RATIO: 3.6 ratio (ref 0.0–5.0)
CHOLESTEROL TOTAL: 178 mg/dL (ref 100–199)
HDL: 49 mg/dL (ref >39)
LDL CALC: 108 mg/dL — AB (ref 0–99)
Triglycerides: 103 mg/dL (ref 0–149)
VLDL CHOLESTEROL CAL: 21 mg/dL (ref 5–40)

## 2016-10-20 LAB — HEMOGLOBIN A1C
Est. average glucose Bld gHb Est-mCnc: 108 mg/dL
HEMOGLOBIN A1C: 5.4 % (ref 4.8–5.6)

## 2016-10-24 ENCOUNTER — Other Ambulatory Visit: Payer: Self-pay | Admitting: *Deleted

## 2016-10-24 DIAGNOSIS — I739 Peripheral vascular disease, unspecified: Secondary | ICD-10-CM

## 2016-11-06 ENCOUNTER — Other Ambulatory Visit: Payer: Self-pay | Admitting: Family Medicine

## 2016-11-06 DIAGNOSIS — I10 Essential (primary) hypertension: Secondary | ICD-10-CM

## 2016-11-06 DIAGNOSIS — M109 Gout, unspecified: Secondary | ICD-10-CM

## 2016-11-07 MED FILL — ALLOPURINOL 100 MG TABLET: 100 | 90 days supply | Qty: 90 | Fill #0

## 2016-11-07 NOTE — Telephone Encounter (Signed)
10/18/16 last ov 

## 2016-11-15 MED FILL — LOSARTAN POTASSIUM 100 MG T: 100 | 30 days supply | Qty: 30 | Fill #0

## 2016-11-22 DIAGNOSIS — I10 Essential (primary) hypertension: Secondary | ICD-10-CM | POA: Diagnosis not present

## 2016-11-22 DIAGNOSIS — K279 Peptic ulcer, site unspecified, unspecified as acute or chronic, without hemorrhage or perforation: Secondary | ICD-10-CM | POA: Diagnosis not present

## 2016-11-22 DIAGNOSIS — R7989 Other specified abnormal findings of blood chemistry: Secondary | ICD-10-CM | POA: Diagnosis not present

## 2016-11-22 MED FILL — AMLODIPINE BESYLATE 10 MG T: 10 | 90 days supply | Qty: 90 | Fill #0

## 2016-11-24 ENCOUNTER — Other Ambulatory Visit: Payer: Self-pay | Admitting: Nephrology

## 2016-11-24 DIAGNOSIS — I10 Essential (primary) hypertension: Secondary | ICD-10-CM

## 2016-11-27 ENCOUNTER — Encounter: Payer: Self-pay | Admitting: Vascular Surgery

## 2016-12-05 ENCOUNTER — Ambulatory Visit (INDEPENDENT_AMBULATORY_CARE_PROVIDER_SITE_OTHER): Payer: 59 | Admitting: Vascular Surgery

## 2016-12-05 ENCOUNTER — Ambulatory Visit (HOSPITAL_COMMUNITY)
Admission: RE | Admit: 2016-12-05 | Discharge: 2016-12-05 | Disposition: A | Discharge status: Home / Self Care | Payer: 59 | Source: Ambulatory Visit | Attending: Vascular Surgery | Admitting: Vascular Surgery

## 2016-12-05 ENCOUNTER — Encounter: Payer: Self-pay | Admitting: Vascular Surgery

## 2016-12-05 VITALS — BP 142/80 | HR 47 | Temp 98.4°F | Resp 16 | Ht 72.0 in | Wt 241.0 lb

## 2016-12-05 DIAGNOSIS — I739 Peripheral vascular disease, unspecified: Secondary | ICD-10-CM | POA: Insufficient documentation

## 2016-12-05 DIAGNOSIS — H5213 Myopia, bilateral: Secondary | ICD-10-CM | POA: Diagnosis not present

## 2016-12-05 DIAGNOSIS — I70212 Atherosclerosis of native arteries of extremities with intermittent claudication, left leg: Secondary | ICD-10-CM

## 2016-12-05 NOTE — Progress Notes (Signed)
History of Present Illness:  Patient is a 51 y.o. year old male who presents for evaluation of claudication.  He has had left LE calf with ambulation on/off at about 150 yard mark.    The symptoms started about 1 month ago.  He reports no edema and no rest pain.  He has no history of non healing ulcers.  The patient currently describes a cramping sensation in the left lower extremity.    Atherosclerotic risk factors and other medical problems include HTN managed with Cozaar and Nebivolol.  He denise DM and CAD.  Past Medical History:  Diagnosis Date  . Allergy   . Anemia   . Headache(784.0)   . Hemorrhoids   . Hypertension   . Kidney insufficiency   . LVH (left ventricular hypertrophy)    Dr. Donnie Ahoilley ( cardiology)  . Obesity   . OSA (obstructive sleep apnea)   . Positive PPD, treated 2000   INH  . Vitamin D deficiency     Past Surgical History:  Procedure Laterality Date  . COLONOSCOPY  12/28/2011   Procedure: COLONOSCOPY;  Surgeon: Rachael Feeaniel P Jacobs, MD;  Location: [facility];  Service: Endoscopy;  Laterality: N/A;  . HEMORRHOID SURGERY      ROS:   General:  No weight loss, Fever, chills  HEENT: No recent headaches, no nasal bleeding, no visual changes, no sore throat  Neurologic: No dizziness, blackouts, seizures. No recent symptoms of stroke or mini- stroke. No recent episodes of slurred speech, or temporary blindness.  Cardiac: No recent episodes of chest pain/pressure, no shortness of breath at rest.  No shortness of breath with exertion.  Denies history of atrial fibrillation or irregular heartbeat  Vascular: No history of rest pain in feet.  No history of claudication.  No history of non-healing ulcer, No history of DVT   Pulmonary: No home oxygen, no productive cough, no hemoptysis,  No asthma or wheezing  Musculoskeletal:  [ ]  Arthritis, [ ]  Low back pain,  [ ]  Joint pain  Hematologic:No history of hypercoagulable state.  No history of easy bleeding.  No  history of anemia  Gastrointestinal: No hematochezia or melena,  No gastroesophageal reflux, no trouble swallowing  Urinary: [ ]  chronic Kidney disease, [ ]  on HD - [ ]  MWF or [ ]  TTHS, [ ]  Burning with urination, [ ]  Frequent urination, [ ]  Difficulty urinating;   Skin: No rashes  Psychological: No history of anxiety,  No history of depression  Social History Social History  Substance Use Topics  . Smoking status: Never Smoker  . Smokeless tobacco: Never Used  . Alcohol use No     Comment: Sometimes    Family History Family History  Problem Relation Age of Onset  . Diabetes Mother   . Heart disease Mother   . Hypertension Mother   . Hypertension Father   . Diabetes Sister     Allergies  Allergies  Allergen Reactions  . Lisinopril     angioedema  . Shrimp [Shellfish Allergy] Nausea And Vomiting     Current Outpatient Prescriptions  Medication Sig Dispense Refill  . allopurinol (ZYLOPRIM) 100 MG tablet TAKE 1 TABLET BY MOUTH ONCE DAILY 90 tablet 0  . docusate sodium (COLACE) 100 MG capsule Take 100 mg by mouth daily.    Marland Kitchen. losartan (COZAAR) 100 MG tablet TAKE 1 TABLET BY MOUTH ONCE DAILY 30 tablet 5  . Nebivolol HCl (BYSTOLIC) 20 MG TABS Take 1 tablet (20  mg total) by mouth 2 (two) times daily. 180 tablet 1  . omeprazole (PRILOSEC) 40 MG capsule TAKE 1 CAPSULE BY MOUTH DAILY. 90 capsule 1  . vitamin B-12 (CYANOCOBALAMIN) 100 MCG tablet Take 100 mcg by mouth daily.     No current facility-administered medications for this visit.     Physical Examination  Vitals:   12/05/16 1524  BP: (!) 142/80  Pulse: (!) 47  Resp: 16  Temp: 98.4 F (36.9 C)  TempSrc: Oral  SpO2: 97%  Weight: 241 lb (109.3 kg)  Height: 6' (1.829 m)    Body mass index is 32.69 kg/m.  General:  Alert and oriented, no acute distress HEENT: Normal Neck: No bruit or JVD Pulmonary: Clear to auscultation bilaterally Cardiac: Regular Rate and Rhythm without murmur Abdomen: Soft,  non-tender, non-distended, no mass, no scars, palpable aorta with deep palpation Skin: No rash Extremity Pulses:  2+ radial, brachial, femoral, 3+ dorsalis pedis, posterior tibial pulses bilaterally Musculoskeletal: No deformity or edema  Neurologic: Upper and lower extremity motor 5/5 and symmetric  DATA:  ABI's Right Biphasic/Triphasic > 1.0 ABI/TBI Left Triphasic > 1 ABI/TBI   ASSESSMENT:  Normal arterial flow at rest with Intermittent claudication symptoms    PLAN:  He has perfectly palpable lower extremity pulses 3+.  His history is compatible with symptoms of claudication.  We will schedule him for an exercise ABI study to see if his symptoms are reproducible.  If the test is positive we will schedule an angiogram with possible intervention.  We will call him with the test results in the near future.    Thomasena Edis, EMMA MAUREEN PA-C [facility]  The patient was seen in conjunction with Dr. Arbie Cookey today  I have examined the patient, reviewed and agree with above. Totally normal physical exam and noninvasive studies at rest. He does have classic symptoms of lower extremity claudication. I discussed this at length with the patient. Have recommended exercise ankle arm index. Explained that this is normal completely rules out arterial insufficiency as a cause of his symptoms. If he does have an abnormal exercise study, would recommend arteriography for further evaluation. We will schedule his outpatient exercise study at his earliest convenience  Early, Tawanna Cooler, MD 12/05/2016 4:11 PM

## 2016-12-08 ENCOUNTER — Ambulatory Visit (HOSPITAL_COMMUNITY)
Admission: RE | Admit: 2016-12-08 | Discharge: 2016-12-08 | Disposition: A | Discharge status: Home / Self Care | Payer: 59 | Source: Ambulatory Visit | Attending: Vascular Surgery | Admitting: Vascular Surgery

## 2016-12-08 DIAGNOSIS — I70212 Atherosclerosis of native arteries of extremities with intermittent claudication, left leg: Secondary | ICD-10-CM | POA: Diagnosis not present

## 2016-12-08 DIAGNOSIS — I998 Other disorder of circulatory system: Secondary | ICD-10-CM | POA: Diagnosis not present

## 2016-12-08 DIAGNOSIS — R52 Pain, unspecified: Secondary | ICD-10-CM | POA: Diagnosis not present

## 2016-12-12 ENCOUNTER — Telehealth: Payer: Self-pay | Admitting: Vascular Surgery

## 2016-12-12 NOTE — Telephone Encounter (Signed)
Spoke by telephone with patient today. He underwent an exercise arterial study in our office. This revealed a totally normal exercise response to walking. Discussed this with the patient explaining that this since he rules out any arterial insufficiency as a cause of his left leg walking discomfort. I suggested that he discuss this further with his primary care physician if this persistently may require orthopedic evaluation. He will see us on an as-needed basis

## 2016-12-13 ENCOUNTER — Ambulatory Visit
Admission: RE | Admit: 2016-12-13 | Discharge: 2016-12-13 | Disposition: A | Discharge status: Home / Self Care | Payer: 59 | Source: Ambulatory Visit | Attending: Nephrology | Admitting: Nephrology

## 2016-12-13 DIAGNOSIS — I10 Essential (primary) hypertension: Secondary | ICD-10-CM

## 2016-12-13 DIAGNOSIS — N189 Chronic kidney disease, unspecified: Secondary | ICD-10-CM | POA: Diagnosis not present

## 2016-12-21 ENCOUNTER — Other Ambulatory Visit: Payer: Self-pay | Admitting: Family Medicine

## 2016-12-21 DIAGNOSIS — I10 Essential (primary) hypertension: Secondary | ICD-10-CM

## 2016-12-21 MED FILL — AZITHROMYCIN 500 MG TABLET: 500 | 6 days supply | Qty: 6 | Fill #0

## 2016-12-21 MED FILL — ATOVAQUONE-PROGUANIL 250-10: 250-100 | 36 days supply | Qty: 36 | Fill #0

## 2016-12-22 NOTE — Telephone Encounter (Signed)
Request for Bystolic 20 mg to be refilled.  Pt was last seen on 10/18/16 with B. Barnett AbuWiseman.  Pt last refill was with Dr.Greene on 09/01/15 with one refill of 180 tablet ( 1 tablet bid) Please advise

## 2016-12-23 NOTE — Telephone Encounter (Signed)
Needs OV.  

## 2016-12-25 MED FILL — BYSTOLIC 20 MG TABLET: 20 | 30 days supply | Qty: 60 | Fill #0

## 2016-12-25 NOTE — Telephone Encounter (Signed)
mychart message sent to pt about making an appt °

## 2016-12-27 MED FILL — SPIRONOLACTONE 25 MG TABLET: 25 | 30 days supply | Qty: 30 | Fill #0

## 2017-01-01 ENCOUNTER — Ambulatory Visit: Payer: 59 | Admitting: Family Medicine

## 2017-01-01 MED FILL — OMEPRAZOLE DR 40 MG CAPSULE: 40 | 90 days supply | Qty: 90 | Fill #1

## 2017-01-01 MED FILL — LOSARTAN POTASSIUM 100 MG T: 100 | 30 days supply | Qty: 30 | Fill #1

## 2017-01-02 ENCOUNTER — Encounter: Payer: Self-pay | Admitting: Physician Assistant

## 2017-01-02 ENCOUNTER — Ambulatory Visit (INDEPENDENT_AMBULATORY_CARE_PROVIDER_SITE_OTHER): Payer: 59 | Admitting: Physician Assistant

## 2017-01-02 DIAGNOSIS — M109 Gout, unspecified: Secondary | ICD-10-CM | POA: Diagnosis not present

## 2017-01-02 DIAGNOSIS — I10 Essential (primary) hypertension: Secondary | ICD-10-CM | POA: Diagnosis not present

## 2017-01-02 MED ORDER — NEBIVOLOL HCL 20 MG PO TABS: 20.0000 mg | ORAL_TABLET | Freq: Every day | ORAL | 1 refills | Status: DC

## 2017-01-02 MED ORDER — ALLOPURINOL 100 MG PO TABS: 100.0000 mg | ORAL_TABLET | Freq: Every day | ORAL | 3 refills | Status: DC

## 2017-01-02 NOTE — Progress Notes (Signed)
    MRN: [id number] DOB: Feb 02, 1966  Subjective:   Raymond Green is a 51 y.o. male presenting for follow up on medication refill for gout and Hypertension.   1) Gout: has been controlled on allopurinol '100mg'$  x 1 year. Has not a gout outbreak since starting this medication.   2) HTN: Currently managed with bystolic '20mg'$  and cozaar '100mg'$  daily. Patient is checking blood pressure at home, range is 664-403K systolic.Denies lightheadedness, dizziness, chronic headache, double vision, chest pain, shortness of breath, heart racing, palpitations, nausea, vomiting, abdominal pain, hematuria, lower leg swelling. Denies smoking. Denies alcohol use. Denies any other aggravating or relieving factors, no other questions or concerns.  Curlie has a current medication list which includes the following prescription(s): allopurinol, bystolic, docusate sodium, losartan, omeprazole, and vitamin b-12. Also is allergic to lisinopril and shrimp [shellfish allergy].  Tahjay  has a past medical history of Allergy; Anemia; Headache(784.0); Hemorrhoids; Hypertension; Kidney insufficiency; LVH (left ventricular hypertrophy); Obesity; OSA (obstructive sleep apnea); Positive PPD, treated (2000); and Vitamin D deficiency. Also  has a past surgical history that includes Hemorrhoid surgery and Colonoscopy (12/28/2011).   Objective:   Vitals: BP 129/84 (BP Location: Right Arm, Patient Position: Sitting, Cuff Size: Large)   Pulse (!) 55   Temp 98.8 F (37.1 C) (Oral)   Resp 18   Ht 5' 11.1" (1.806 m)   Wt 242 lb 6.4 oz (110 kg)   SpO2 99%   BMI 33.71 kg/m   Physical Exam  Constitutional: He is oriented to person, place, and time. He appears well-developed and well-nourished.  HENT:  Head: Normocephalic and atraumatic.  Eyes: Conjunctivae are normal.  Neck: Normal range of motion.  Cardiovascular: Normal rate, regular rhythm, normal heart sounds and intact distal pulses.   Pulmonary/Chest: Effort normal and  breath sounds normal.  Musculoskeletal:       Right lower leg: He exhibits no swelling.       Left lower leg: He exhibits no swelling.  Neurological: He is alert and oriented to person, place, and time.  Skin: Skin is warm and dry.  Psychiatric: He has a normal mood and affect.  Vitals reviewed.   No results found for this or any previous visit (from the past 24 hour(s)).  Assessment and Plan :  1. Essential hypertension Well controlled if office. Continue monitoring bp outside of office and taking medications as prescribed. Follow up in 6 months for reevaluation.  - CMP14+EGFR  2. Gout of foot, unspecified cause, unspecified chronicity, unspecified laterality Controlled. Continue medication as prescribed.  - allopurinol (ZYLOPRIM) 100 MG tablet; Take 1 tablet (100 mg total) by mouth daily.  Dispense: 90 tablet; Refill: 3 - Uric Acid  Tenna Delaine, PA-C  [facility] at [place] 01/02/2017 11:08 PM

## 2017-01-02 NOTE — Patient Instructions (Addendum)
For high blood pressure and gout, continue medications as prescribed. We will contact you within the next week with your lab results. Plan to follow up in 6 months for reevaluation. Thank you for letting me participate in your health and well being.     IF you received an x-ray today, you will receive an invoice from [facility]. Please contact [facility] at [phone] with questions or concerns regarding your invoice.   IF you received labwork today, you will receive an invoice from [facility]. Please contact [facility] at [phone] with questions or concerns regarding your invoice.   Our billing staff will not be able to assist you with questions regarding bills from these companies.  You will be contacted with the lab results as soon as they are available. The fastest way to get your results is to activate your My Chart account. Instructions are located on the last page of this paperwork. If you have not heard from us regarding the results in 2 weeks, please contact this office.

## 2017-01-03 LAB — URIC ACID: URIC ACID: 6.4 mg/dL (ref 3.7–8.6)

## 2017-01-03 LAB — CMP14+EGFR
A/G RATIO: 1.6 (ref 1.2–2.2)
ALBUMIN: 4.4 g/dL (ref 3.5–5.5)
ALK PHOS: 55 IU/L (ref 39–117)
ALT: 16 IU/L (ref 0–44)
AST: 22 IU/L (ref 0–40)
BILIRUBIN TOTAL: 0.3 mg/dL (ref 0.0–1.2)
BUN / CREAT RATIO: 13 (ref 9–20)
BUN: 17 mg/dL (ref 6–24)
CHLORIDE: 103 mmol/L (ref 96–106)
CO2: 22 mmol/L (ref 20–29)
Calcium: 9.8 mg/dL (ref 8.7–10.2)
Creatinine, Ser: 1.35 mg/dL — ABNORMAL HIGH (ref 0.76–1.27)
GFR calc non Af Amer: 61 mL/min/{1.73_m2} (ref >59)
GFR, EST AFRICAN AMERICAN: 70 mL/min/{1.73_m2} (ref >59)
GLOBULIN, TOTAL: 2.8 g/dL (ref 1.5–4.5)
GLUCOSE: 84 mg/dL (ref 65–99)
POTASSIUM: 4.3 mmol/L (ref 3.5–5.2)
SODIUM: 143 mmol/L (ref 134–144)
Total Protein: 7.2 g/dL (ref 6.0–8.5)

## 2017-01-04 DIAGNOSIS — I10 Essential (primary) hypertension: Secondary | ICD-10-CM | POA: Diagnosis not present

## 2017-05-16 MED FILL — ALLOPURINOL 100 MG TABS: 100 | 90 days supply | Qty: 90 | Fill #0

## 2017-05-16 MED FILL — SPIRONOLACTONE 25 MG TABLET: 25 | 30 days supply | Qty: 30 | Fill #1

## 2017-05-16 MED FILL — LOSARTAN POTASSIUM 100 MG T: 100 | 30 days supply | Qty: 30 | Fill #2

## 2017-05-16 MED FILL — AMLODIPINE BESYLATE 10 MG T: 10 | 90 days supply | Qty: 90 | Fill #1

## 2017-05-18 DIAGNOSIS — R109 Unspecified abdominal pain: Secondary | ICD-10-CM | POA: Diagnosis not present

## 2017-06-07 DIAGNOSIS — N182 Chronic kidney disease, stage 2 (mild): Secondary | ICD-10-CM | POA: Diagnosis not present

## 2017-06-07 DIAGNOSIS — I10 Essential (primary) hypertension: Secondary | ICD-10-CM | POA: Diagnosis not present

## 2017-06-08 MED FILL — FUROSEMIDE 40 MG TABS: 40 | 90 days supply | Qty: 90 | Fill #0

## 2017-06-15 DIAGNOSIS — N182 Chronic kidney disease, stage 2 (mild): Secondary | ICD-10-CM | POA: Diagnosis not present

## 2017-07-09 ENCOUNTER — Other Ambulatory Visit: Payer: Self-pay | Admitting: Physician Assistant

## 2017-07-09 DIAGNOSIS — K219 Gastro-esophageal reflux disease without esophagitis: Secondary | ICD-10-CM

## 2017-07-09 MED FILL — OMEPRAZOLE DR 40 MG CAPSULE: 40 | 90 days supply | Qty: 90 | Fill #0

## 2017-07-09 MED FILL — BYSTOLIC 20 MG TABLET: 20 | 90 days supply | Qty: 90 | Fill #0

## 2017-07-27 ENCOUNTER — Ambulatory Visit (INDEPENDENT_AMBULATORY_CARE_PROVIDER_SITE_OTHER): Payer: 59 | Admitting: Physician Assistant

## 2017-07-27 ENCOUNTER — Other Ambulatory Visit: Payer: Self-pay

## 2017-07-27 ENCOUNTER — Encounter: Payer: Self-pay | Admitting: Physician Assistant

## 2017-07-27 ENCOUNTER — Ambulatory Visit (INDEPENDENT_AMBULATORY_CARE_PROVIDER_SITE_OTHER): Payer: 59

## 2017-07-27 ENCOUNTER — Ambulatory Visit: Payer: 59

## 2017-07-27 VITALS — BP 126/80 | HR 50 | Temp 98.7°F | Resp 16 | Ht 71.0 in | Wt 245.6 lb

## 2017-07-27 DIAGNOSIS — M79672 Pain in left foot: Secondary | ICD-10-CM

## 2017-07-27 DIAGNOSIS — M109 Gout, unspecified: Secondary | ICD-10-CM

## 2017-07-27 DIAGNOSIS — M79671 Pain in right foot: Secondary | ICD-10-CM | POA: Diagnosis not present

## 2017-07-27 DIAGNOSIS — M19071 Primary osteoarthritis, right ankle and foot: Secondary | ICD-10-CM | POA: Diagnosis not present

## 2017-07-27 MED ORDER — COLCHICINE 0.6 MG PO TABS: ORAL_TABLET | ORAL | 1 refills | Status: DC

## 2017-07-27 MED ORDER — PREDNISONE 20 MG PO TABS: ORAL_TABLET | ORAL | 0 refills | Status: DC

## 2017-07-27 MED FILL — COLCHICINE 0.6 MG TABS: 0.6 | 18 days supply | Qty: 20 | Fill #0

## 2017-07-27 MED FILL — predniSONE 20 MG TABS: 20 | 6 days supply | Qty: 12 | Fill #0

## 2017-07-27 NOTE — Patient Instructions (Addendum)
Your x-ray looks great. Start the following medications as directed:  Take prednisone 3 pills daily for 2 days, then 2 daily for 2 days, then 1 daily for 2 days take the initial pills today, then take each day after breakfast.  Take colchicine 2 pills initially now, then one tonight, and 1 daily thereafter. This may make your stools a little loose. You can discontinue these within the painful flare of gout has resolved.   Continue taking Allopurinol during treatment.   Gout flares are reduced with DASH diet. (see below)  DASH Eating Plan DASH stands for "Dietary Approaches to Stop Hypertension." The DASH eating plan is a healthy eating plan that has been shown to reduce high blood pressure (hypertension). It may also reduce your risk for type 2 diabetes, heart disease, and stroke. The DASH eating plan may also help with weight loss. What are tips for following this plan? General guidelines  Avoid eating more than 2,300 mg (milligrams) of salt (sodium) a day. If you have hypertension, you may need to reduce your sodium intake to 1,500 mg a day.  Limit alcohol intake to no more than 1 drink a day for nonpregnant women and 2 drinks a day for men. One drink equals 12 oz of beer, 5 oz of wine, or 1 oz of hard liquor.  Work with your health care provider to maintain a healthy body weight or to lose weight. Ask what an ideal weight is for you.  Get at least 30 minutes of exercise that causes your heart to beat faster (aerobic exercise) most days of the week. Activities may include walking, swimming, or biking.  Work with your health care provider or diet and nutrition specialist (dietitian) to adjust your eating plan to your individual calorie needs. Reading food labels  Check food labels for the amount of sodium per serving. Choose foods with less than 5 percent of the Daily Value of sodium. Generally, foods with less than 300 mg of sodium per serving fit into this eating plan.  To find whole  grains, look for the word "whole" as the first word in the ingredient list. Shopping  Buy products labeled as "low-sodium" or "no salt added."  Buy fresh foods. Avoid canned foods and premade or frozen meals. Cooking  Avoid adding salt when cooking. Use salt-free seasonings or herbs instead of table salt or sea salt. Check with your health care provider or pharmacist before using salt substitutes.  Do not fry foods. Cook foods using healthy methods such as baking, boiling, grilling, and broiling instead.  Cook with heart-healthy oils, such as olive, canola, soybean, or sunflower oil. Meal planning   Eat a balanced diet that includes: ? 5 or more servings of fruits and vegetables each day. At each meal, try to fill half of your plate with fruits and vegetables. ? Up to 6-8 servings of whole grains each day. ? Less than 6 oz of lean meat, poultry, or fish each day. A 3-oz serving of meat is about the same size as a deck of cards. One egg equals 1 oz. ? 2 servings of low-fat dairy each day. ? A serving of nuts, seeds, or beans 5 times each week. ? Heart-healthy fats. Healthy fats called Omega-3 fatty acids are found in foods such as flaxseeds and coldwater fish, like sardines, salmon, and mackerel.  Limit how much you eat of the following: ? Canned or prepackaged foods. ? Food that is high in trans fat, such as fried foods. ?  Food that is high in saturated fat, such as fatty meat. ? Sweets, desserts, sugary drinks, and other foods with added sugar. ? Full-fat dairy products.  Do not salt foods before eating.  Try to eat at least 2 vegetarian meals each week.  Eat more home-cooked food and less restaurant, buffet, and fast food.  When eating at a restaurant, ask that your food be prepared with less salt or no salt, if possible. What foods are recommended? The items listed may not be a complete list. Talk with your dietitian about what dietary choices are best for  you. Grains Whole-grain or whole-wheat bread. Whole-grain or whole-wheat pasta. Brown rice. [place]. Bulgur. Whole-grain and low-sodium cereals. Pita bread. Low-fat, low-sodium crackers. Whole-wheat flour tortillas. Vegetables Fresh or frozen vegetables (raw, steamed, roasted, or grilled). Low-sodium or reduced-sodium tomato and vegetable juice. Low-sodium or reduced-sodium tomato sauce and tomato paste. Low-sodium or reduced-sodium canned vegetables. Fruits All fresh, dried, or frozen fruit. Canned fruit in natural juice (without added sugar). Meat and other protein foods Skinless chicken or [place]. Ground chicken or [place]. Pork with fat trimmed off. Fish and seafood. Egg whites. Dried beans, peas, or lentils. Unsalted nuts, nut butters, and seeds. Unsalted canned beans. Lean cuts of beef with fat trimmed off. Low-sodium, lean deli meat. Dairy Low-fat (1%) or fat-free (skim) milk. Fat-free, low-fat, or reduced-fat cheeses. Nonfat, low-sodium ricotta or cottage cheese. Low-fat or nonfat yogurt. Low-fat, low-sodium cheese. Fats and oils Soft margarine without trans fats. Vegetable oil. Low-fat, reduced-fat, or light mayonnaise and salad dressings (reduced-sodium). Canola, safflower, olive, soybean, and sunflower oils. Avocado. Seasoning and other foods Herbs. Spices. Seasoning mixes without salt. Unsalted popcorn and pretzels. Fat-free sweets. What foods are not recommended? The items listed may not be a complete list. Talk with your dietitian about what dietary choices are best for you. Grains Baked goods made with fat, such as croissants, muffins, or some breads. Dry pasta or rice meal packs. Vegetables Creamed or fried vegetables. Vegetables in a cheese sauce. Regular canned vegetables (not low-sodium or reduced-sodium). Regular canned tomato sauce and paste (not low-sodium or reduced-sodium). Regular tomato and vegetable juice (not low-sodium or reduced-sodium). Angie Fava.  Olives. Fruits Canned fruit in a light or heavy syrup. Fried fruit. Fruit in cream or butter sauce. Meat and other protein foods Fatty cuts of meat. Ribs. Fried meat. Berniece Salines. Sausage. Bologna and other processed lunch meats. Salami. Fatback. Hotdogs. Bratwurst. Salted nuts and seeds. Canned beans with added salt. Canned or smoked fish. Whole eggs or egg yolks. Chicken or [place] with skin. Dairy Whole or 2% milk, cream, and half-and-half. Whole or full-fat cream cheese. Whole-fat or sweetened yogurt. Full-fat cheese. Nondairy creamers. Whipped toppings. Processed cheese and cheese spreads. Fats and oils Butter. Stick margarine. Lard. Shortening. Ghee. Bacon fat. Tropical oils, such as coconut, palm kernel, or palm oil. Seasoning and other foods Salted popcorn and pretzels. Onion salt, garlic salt, seasoned salt, table salt, and sea salt. Worcestershire sauce. Tartar sauce. Barbecue sauce. Teriyaki sauce. Soy sauce, including reduced-sodium. Steak sauce. Canned and packaged gravies. Fish sauce. Oyster sauce. Cocktail sauce. Horseradish that you find on the shelf. Ketchup. Mustard. Meat flavorings and tenderizers. Bouillon cubes. Hot sauce and Tabasco sauce. Premade or packaged marinades. Premade or packaged taco seasonings. Relishes. Regular salad dressings. Where to find more information:  National Heart, Lung, and [place]: https://wilson-eaton.com/  American Heart Association: www.heart.org Summary  The DASH eating plan is a healthy eating plan that has been shown to reduce high blood  pressure (hypertension). It may also reduce your risk for type 2 diabetes, heart disease, and stroke.  With the DASH eating plan, you should limit salt (sodium) intake to 2,300 mg a day. If you have hypertension, you may need to reduce your sodium intake to 1,500 mg a day.  When on the DASH eating plan, aim to eat more fresh fruits and vegetables, whole grains, lean proteins, low-fat dairy, and heart-healthy  fats.  Work with your health care provider or diet and nutrition specialist (dietitian) to adjust your eating plan to your individual calorie needs. This information is not intended to replace advice given to you by your health care provider. Make sure you discuss any questions you have with your health care provider. Document Released: 05/25/2011 Document Revised: 05/29/2016 Document Reviewed: 05/29/2016 Elsevier Interactive Patient Education  [employer].   Thank you for coming in today. I hope you feel we met your needs.  Feel free to call PCP if you have any questions or further requests.  Please consider signing up for MyChart if you do not already have it, as this is a great way to communicate with me.  Best,  Whitney McVey, PA-C   IF you received an x-ray today, you will receive an invoice from [facility]. Please contact [facility] at [phone] with questions or concerns regarding your invoice.   IF you received labwork today, you will receive an invoice from [facility]. Please contact [facility] at [phone] with questions or concerns regarding your invoice.   Our billing staff will not be able to assist you with questions regarding bills from these companies.  You will be contacted with the lab results as soon as they are available. The fastest way to get your results is to activate your My Chart account. Instructions are located on the last page of this paperwork. If you have not heard from [place] regarding the results in 2 weeks, please contact this office.

## 2017-07-27 NOTE — Progress Notes (Signed)
Raymond Green  MRN: [id number] DOB: 1965/12/21  PCP: Shade Flood, MD  Subjective:  Pt is a 52 year old male PMH HTN, iron deficiency anemia, gout, GERD who presents to clinic for left foot pain. Started yesterday morning. 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 Pain is located at joint of big toe. Endorses redness and pain. Denies wound, deformity, weakness.   He is currently taking Allopurinol 100mg  qd.  Last gout flare 2 years ago.  He went to gathering of friends a few weeks ago - ate a lot of meat.  Does not drink beer.   Review of Systems  Constitutional: Negative for chills, diaphoresis, fatigue and fever.  Musculoskeletal: Positive for arthralgias (left foot), gait problem and joint swelling.  Neurological: Negative for weakness and numbness.    Patient Active Problem List   Diagnosis Date Noted  . Low back pain 07/13/2016  . Abdominal pain, left lower quadrant 05/17/2016  . Elevated serum creatinine 05/29/2015  . Gout 10/29/2013  . HYPERTENSION 03/02/2008  . GERD 03/02/2008  . Iron deficiency anemia 12/13/2007  . HEMORRHOIDS-EXTERNAL 12/13/2007    Current Outpatient Medications on File Prior to Visit  Medication Sig Dispense Refill  . allopurinol (ZYLOPRIM) 100 MG tablet Take 1 tablet (100 mg total) by mouth daily. 90 tablet 3  . docusate sodium (COLACE) 100 MG capsule Take 100 mg by mouth daily.    Marland Kitchen losartan (COZAAR) 100 MG tablet TAKE 1 TABLET BY MOUTH ONCE DAILY 30 tablet 5  . Nebivolol HCl (BYSTOLIC) 20 MG TABS Take 1 tablet (20 mg total) by mouth daily. 90 tablet 1  . omeprazole (PRILOSEC) 40 MG capsule TAKE 1 CAPSULE BY MOUTH DAILY. 90 capsule 0  . vitamin B-12 (CYANOCOBALAMIN) 100 MCG tablet Take 100 mcg by mouth daily.     No current facility-administered medications on file prior to visit.     Allergies  Allergen Reactions  . Lisinopril     angioedema  . Shrimp [Shellfish  Allergy] Nausea And Vomiting     Objective:  BP 126/80   Pulse (!) 50   Temp 98.7 F (37.1 C) (Oral)   Resp 16   Ht 5\' 11"  (1.803 m)   Wt 245 lb 9.6 oz (111.4 kg)   SpO2 100%   BMI 34.25 kg/m   Physical Exam  Constitutional: He is oriented to person, place, and time and well-developed, well-nourished, and in no distress. No distress.  Cardiovascular: Normal rate, regular rhythm and normal heart sounds.  Musculoskeletal:       Left foot: There is tenderness, bony tenderness and swelling. There is normal range of motion, no crepitus, no deformity and no laceration.       Feet:  Neurological: He is alert and oriented to person, place, and time. GCS score is 15.  Skin: Skin is warm and dry.  Psychiatric: Mood, memory, affect and judgment normal.  Vitals reviewed.   DG Left foot 07/27/2017  FINDINGS: Negative for fracture. Mild degenerative change in the first MTP joint. No erosion. Soft tissue swelling medial to the first MTP. Degenerative spurring in the midfoot. Pes planus. Mild calcaneal spurring.  IMPRESSION: Mild degenerative change first MTP.  Negative for fracture.  Assessment and Plan :  1. Acute gout of left foot, unspecified cause - predniSONE (DELTASONE) 20 MG tablet; Take 3 PO QAM x2days, 2 PO QAM x2days, 1 PO QAM x2days  Dispense: 12  tablet; Refill: 0 - colchicine 0.6 MG tablet; Take 2 initially, then one tonight, then take one tablet daily until gout improved  Dispense: 20 tablet; Refill: 1 - Pt presents for left foot pain x 2 days after x-ray table rolled over it. X-ray is negative. H/o gout. Currently on chronic allopurinol therapy. Suspect gout flare. Plan to treat with prednisone and colchicine, con't allopurinol. RTC if no improvement. Consider uric acid check.  2. Left foot pain - DG Foot Complete Left; Future  Marco CollieWhitney Orman Matsumura, PA-C  [facility] 07/27/2017 2:58 PM

## 2017-08-14 MED FILL — SPIRONOLACTONE 25 MG TABLET: 25 | 30 days supply | Qty: 30 | Fill #2

## 2017-08-14 MED FILL — LOSARTAN POTASSIUM 100 MG T: 100 | 30 days supply | Qty: 30 | Fill #3

## 2017-08-14 MED FILL — COLCHICINE 0.6 MG TABS: 0.6 | 18 days supply | Qty: 20 | Fill #1

## 2017-10-22 ENCOUNTER — Encounter: Payer: Self-pay | Admitting: Family Medicine

## 2017-10-22 ENCOUNTER — Ambulatory Visit: Payer: 59 | Admitting: Family Medicine

## 2017-10-22 ENCOUNTER — Other Ambulatory Visit: Payer: Self-pay

## 2017-10-22 VITALS — BP 132/76 | HR 71 | Temp 98.5°F | Ht 72.0 in | Wt 246.8 lb

## 2017-10-22 DIAGNOSIS — R109 Unspecified abdominal pain: Secondary | ICD-10-CM

## 2017-10-22 DIAGNOSIS — M109 Gout, unspecified: Secondary | ICD-10-CM | POA: Diagnosis not present

## 2017-10-22 MED ORDER — PREDNISONE 20 MG PO TABS
40.0000 mg | ORAL_TABLET | Freq: Every day | ORAL | 0 refills | Status: DC
Start: 2017-10-22 — End: 2018-05-13

## 2017-10-22 MED ORDER — COLCHICINE 0.6 MG PO TABS: ORAL_TABLET | ORAL | 1 refills | Status: DC

## 2017-10-22 NOTE — Patient Instructions (Addendum)
Try restarting colchicine today - 2 pills once, then 1 more in an hour if pain is not improving. If that foot pain is not improving in next few days, I printed the prescription for prednisone. If you do take prednisone in a few days then stop the colchicine at that time.   I would like to recheck the uric acid at a visit in next 3-4 weeks to decide if higher dose of allopurinol is needed.   For your abdominal pain that still may be related to constipation. Colace as stool softener or miralax if you are unable to have a bowel movement.  Please call Dr. [name] for appointment to decide on next step.  Return to the clinic or go to the nearest emergency room if any of your symptoms worsen or new symptoms occur.  Water and fiber in diet can also help prevent constipation.    Gout Gout is painful swelling that can occur in some of your joints. Gout is a type of arthritis. This condition is caused by having too much uric acid in your body. Uric acid is a chemical that forms when your body breaks down substances called purines. Purines are important for building body proteins. When your body has too much uric acid, sharp crystals can form and build up inside your joints. This causes pain and swelling. Gout attacks can happen quickly and be very painful (acute gout). Over time, the attacks can affect more joints and become more frequent (chronic gout). Gout can also cause uric acid to build up under your skin and inside your kidneys. What are the causes? This condition is caused by too much uric acid in your blood. This can occur because:  Your kidneys do not remove enough uric acid from your blood. This is the most common cause.  Your body makes too much uric acid. This can occur with some cancers and cancer treatments. It can also occur if your body is breaking down too many red blood cells (hemolytic anemia).  You eat too many foods that are high in purines. These foods include organ meats and some  seafood. Alcohol, especially beer, is also high in purines.  A gout attack may be triggered by trauma or stress. What increases the risk? This condition is more likely to develop in people who:  Have a family history of gout.  Are male and middle-aged.  Are male and have gone through menopause.  Are obese.  Frequently drink alcohol, especially beer.  Are dehydrated.  Lose weight too quickly.  Have an organ transplant.  Have lead poisoning.  Take certain medicines, including aspirin, cyclosporine, diuretics, levodopa, and niacin.  Have kidney disease or psoriasis.  What are the signs or symptoms? An attack of acute gout happens quickly. It usually occurs in just one joint. The most common place is the big toe. Attacks often start at night. Other joints that may be affected include joints of the feet, ankle, knee, fingers, wrist, or elbow. Symptoms may include:  Severe pain.  Warmth.  Swelling.  Stiffness.  Tenderness. The affected joint may be very painful to touch.  Shiny, red, or purple skin.  Chills and fever.  Chronic gout may cause symptoms more frequently. More joints may be involved. You may also have white or yellow lumps (tophi) on your hands or feet or in other areas near your joints. How is this diagnosed? This condition is diagnosed based on your symptoms, medical history, and physical exam. You may have tests, such  as:  Blood tests to measure uric acid levels.  Removal of joint fluid with a needle (aspiration) to look for uric acid crystals.  X-rays to look for joint damage.  How is this treated? Treatment for this condition has two phases: treating an acute attack and preventing future attacks. Acute gout treatment may include medicines to reduce pain and swelling, including:  NSAIDs.  Steroids. These are strong anti-inflammatory medicines that can be taken by mouth (orally) or injected into a joint.  Colchicine. This medicine relieves pain  and swelling when it is taken soon after an attack. It can be given orally or through an IV tube.  Preventive treatment may include:  Daily use of smaller doses of NSAIDs or colchicine.  Use of a medicine that reduces uric acid levels in your blood.  Changes to your diet. You may need to see a specialist about healthy eating (dietitian).  Follow these instructions at home: During a Gout Attack  If directed, apply ice to the affected area: ? Put ice in a plastic bag. ? Place a towel between your skin and the bag. ? Leave the ice on for 20 minutes, 2-3 times a day.  Rest the joint as much as possible. If the affected joint is in your leg, you may be given crutches to use.  Raise (elevate) the affected joint above the level of your heart as often as possible.  Drink enough fluids to keep your urine clear or pale yellow.  Take over-the-counter and prescription medicines only as told by your health care provider.  Do not drive or operate heavy machinery while taking prescription pain medicine.  Follow instructions from your health care provider about eating or drinking restrictions.  Return to your normal activities as told by your health care provider. Ask your health care provider what activities are safe for you. Avoiding Future Gout Attacks  Follow a low-purine diet as told by your dietitian or health care provider. Avoid foods and drinks that are high in purines, including liver, kidney, anchovies, asparagus, herring, mushrooms, mussels, and beer.  Limit alcohol intake to no more than 1 drink a day for nonpregnant women and 2 drinks a day for men. One drink equals 12 oz of beer, 5 oz of wine, or 1 oz of hard liquor.  Maintain a healthy weight or lose weight if you are overweight. If you want to lose weight, talk with your health care provider. It is important that you do not lose weight too quickly.  Start or maintain an exercise program as told by your health care  provider.  Drink enough fluids to keep your urine clear or pale yellow.  Take over-the-counter and prescription medicines only as told by your health care provider.  Keep all follow-up visits as told by your health care provider. This is important. Contact a health care provider if:  You have another gout attack.  You continue to have symptoms of a gout attack after10 days of treatment.  You have side effects from your medicines.  You have chills or a fever.  You have burning pain when you urinate.  You have pain in your lower back or belly. Get help right away if:  You have severe or uncontrolled pain.  You cannot urinate. This information is not intended to replace advice given to you by your health care provider. Make sure you discuss any questions you have with your health care provider. Document Released: 06/02/2000 Document Revised: 11/11/2015 Document Reviewed: 03/18/2015 Elsevier Interactive  Patient Education  2018 [employer]  Low-Purine Diet Purines are compounds that affect the level of uric acid in your body. A low-purine diet is a diet that is low in purines. Eating a low-purine diet can prevent the level of uric acid in your body from getting too high and causing gout or kidney stones or both. What do I need to know about this diet?  Choose low-purine foods. Examples of low-purine foods are listed in the next section.  Drink plenty of fluids, especially water. Fluids can help remove uric acid from your body. Try to drink 8-16 cups (1.9-3.8 L) a day.  Limit foods high in fat, especially saturated fat, as fat makes it harder for the body to get rid of uric acid. Foods high in saturated fat include pizza, cheese, ice cream, whole milk, fried foods, and gravies. Choose foods that are lower in fat and lean sources of protein. Use olive oil when cooking as it contains healthy fats that are not high in saturated fat.  Limit alcohol. Alcohol interferes with the  elimination of uric acid from your body. If you are having a gout attack, avoid all alcohol.  Keep in mind that different people's bodies react differently to different foods. You will probably learn over time which foods do or do not affect you. If you discover that a food tends to cause your gout to flare up, avoid eating that food. You can more freely enjoy foods that do not cause problems. If you have any questions about a food item, talk to your dietitian or health care provider. Which foods are low, moderate, and high in purines? The following is a list of foods that are low, moderate, and high in purines. You can eat any amount of the foods that are low in purines. You may be able to have small amounts of foods that are moderate in purines. Ask your health care provider how much of a food moderate in purines you can have. Avoid foods high in purines. Grains  Foods low in purines: Enriched white bread, pasta, rice, cake, cornbread, popcorn.  Foods moderate in purines: Whole-grain breads and cereals, wheat germ, bran, oatmeal. Uncooked oatmeal. Dry wheat bran or wheat germ.  Foods high in purines: Pancakes, [place] toast, biscuits, muffins. Vegetables  Foods low in purines: All vegetables, except those that are moderate in purines.  Foods moderate in purines: Asparagus, cauliflower, spinach, mushrooms, green peas. Fruits  All fruits are low in purines. Meats and other Protein Foods  Foods low in purines: Eggs, nuts, peanut butter.  Foods moderate in purines: 80-90% lean beef, lamb, veal, pork, poultry, fish, eggs, peanut butter, nuts. Crab, lobster, oysters, and shrimp. Cooked dried beans, peas, and lentils.  Foods high in purines: Anchovies, sardines, herring, mussels, tuna, codfish, scallops, trout, and haddock. [place]. Organ meats (such as liver or kidney). [place]. Game meat. Goose. Sweetbreads. Dairy  All dairy foods are low in purines. Low-fat and fat-free dairy products are best  because they are low in saturated fat. Beverages  Drinks low in purines: Water, carbonated beverages, tea, coffee, cocoa.  Drinks moderate in purines: Soft drinks and other drinks sweetened with high-fructose corn syrup. Juices. To find whether a food or drink is sweetened with high-fructose corn syrup, look at the ingredients list.  Drinks high in purines: Alcoholic beverages (such as beer). Condiments  Foods low in purines: Salt, herbs, olives, pickles, relishes, vinegar.  Foods moderate in purines: Butter, margarine, oils, mayonnaise. Fats and Oils  Foods low in purines: All types, except gravies and sauces made with meat.  Foods high in purines: Gravies and sauces made with meat. Other Foods  Foods low in purines: Sugars, sweets, gelatin. Cake. Soups made without meat.  Foods moderate in purines: Meat-based or fish-based soups, broths, or bouillons. Foods and drinks sweetened with high-fructose corn syrup.  Foods high in purines: High-fat desserts (such as ice cream, cookies, cakes, pies, doughnuts, and chocolate). Contact your dietitian for more information on foods that are not listed here. This information is not intended to replace advice given to you by your health care provider. Make sure you discuss any questions you have with your health care provider. Document Released: 09/30/2010 Document Revised: 11/11/2015 Document Reviewed: 05/12/2013 Elsevier Interactive Patient Education  2017 [employer].    IF you received an x-ray today, you will receive an invoice from [facility]. Please contact [facility] at [phone] with questions or concerns regarding your invoice.   IF you received labwork today, you will receive an invoice from [facility]. Please contact [facility] at [phone] with questions or concerns regarding your invoice.   Our billing staff will not be able to assist you with questions regarding bills from these companies.  You will be  contacted with the lab results as soon as they are available. The fastest way to get your results is to activate your My Chart account. Instructions are located on the last page of this paperwork. If you have not heard from [place] regarding the results in 2 weeks, please contact this office.

## 2017-10-22 NOTE — Progress Notes (Signed)
Subjective:  By signing my name below, I, Raymond Green, attest that this documentation has been prepared under the direction and in the presence of Raymond Staggers, MD. Electronically Signed: Stann Green, Scribe. 10/22/2017 , 6:28 PM .  Patient was seen in Room 10 .   Patient ID: Raymond Green, male    DOB: 1965/08/28, 52 y.o.   MRN: [id number] Chief Complaint  Patient presents with  . Gout pain    left big toe (yesterday)  . abdomal pain    left side pain (come and goes for a year)   HPI Raymond Green is a 52 y.o. male  Here for multiple concerns today, left great toe pain and left abdominal pain.   Abdominal pain Patient has had some long standing abdominal pain, seen by Dr. [name]. He did have some low back pain with radicular symptoms at that time. He had xray of lumbar spine with some arthritic changes. I saw patient in 2017 with dysphagia but had been evaluation by GI ([name]) for his abdominal pain in the past. He was treated with Colace and Miralax for constipation. MRI of his abdomen in 2016 without explanation for his LUQ pain at that time.   He states this burning abdominal pain is the same as previous abdominal pain. He notes having occasional hard stools, about once a week. He saw GI, Dr. [name], about 2-3 months ago, and instructed him to take Aleve without relief. He hasn't returned for follow up. He denies nausea, vomiting or heartburn. He reports drinking plenty of water daily.   Left great toe pain Patient did have uric acid of 6.4 in July 2018 with history of gout. He was treated with allopurinol  QD.   Patient states his left great toe pain started yesterday. He's still taking allopurinol  daily, except this morning. He hasn't taken colchicine recently (colchicine was covered previously). His last prednisone was prescribed on Feb 8th for gout. He hasn't taken medication for this flare. He had 1 flare of gout last year. He denies history  of diabetes. He denies drinking beer. He denies any recent BBQ.   Patient Active Problem List   Diagnosis Date Noted  . Low back pain 07/13/2016  . Abdominal pain, left lower quadrant 05/17/2016  . Elevated serum creatinine 05/29/2015  . Gout 10/29/2013  . HYPERTENSION 03/02/2008  . GERD 03/02/2008  . Iron deficiency anemia 12/13/2007  . HEMORRHOIDS-EXTERNAL 12/13/2007   Past Medical History:  Diagnosis Date  . Allergy   . Anemia   . Headache(784.0)   . Hemorrhoids   . Hypertension   . Kidney insufficiency   . LVH (left ventricular hypertrophy)    Dr. Donnie Aho ( cardiology)  . Obesity   . OSA (obstructive sleep apnea)   . Positive PPD, treated 2000   INH  . Vitamin D deficiency    Past Surgical History:  Procedure Laterality Date  . COLONOSCOPY  12/28/2011   Procedure: COLONOSCOPY;  Surgeon: [name], MD;  Location: [facility];  Service: Endoscopy;  Laterality: N/A;  . HEMORRHOID SURGERY     Allergies  Allergen Reactions  . Lisinopril     angioedema  . Shrimp [Shellfish Allergy] Nausea And Vomiting   Prior to Admission medications   Medication Sig Start Date End Date Taking? Authorizing Provider  allopurinol (ZYLOPRIM) 100 MG tablet Take 1 tablet (100 mg total) by mouth daily. 01/02/17  Yes [name], [place] D, PA-C  colchicine 0.6 MG tablet Take 2  initially, then one tonight, then take one tablet daily until gout improved 07/27/17  Yes McVey, [name], PA-C  docusate sodium (COLACE) 100 MG capsule Take 100 mg by mouth daily.   Yes [provider]  losartan (COZAAR) 100 MG tablet TAKE 1 TABLET BY MOUTH ONCE DAILY 11/07/16  Yes [name], [place] D, PA-C  Nebivolol HCl (BYSTOLIC) 20 MG TABS Take 1 tablet (20 mg total) by mouth daily. 01/02/17  Yes [name], [place] D, PA-C  omeprazole (PRILOSEC) 40 MG capsule TAKE 1 CAPSULE BY MOUTH DAILY. 07/09/17  Yes [name], PA-C  predniSONE (DELTASONE) 20 MG tablet Take 3 PO QAM x2days, 2 PO QAM x2days, 1  PO QAM x2days 07/27/17  Yes McVey, [name], PA-C  vitamin B-12 (CYANOCOBALAMIN) 100 MCG tablet Take 100 mcg by mouth daily.    [provider]   Social History   Socioeconomic History  . Marital status: Married    Spouse name: Not on file  . Number of children: Not on file  . Years of education: Not on file  . Highest education level: Not on file  Occupational History  . Occupation: Endoscopy Technician  Social Needs  . Financial resource strain: Not on file  . Food insecurity:    Worry: Not on file    Inability: Not on file  . Transportation needs:    Medical: Not on file    Non-medical: Not on file  Tobacco Use  . Smoking status: Never Smoker  . Smokeless tobacco: Never Used  Substance and Sexual Activity  . Alcohol use: No    Alcohol/week: 0.0 oz  . Drug use: No  . Sexual activity: Yes  Lifestyle  . Physical activity:    Days per week: Not on file    Minutes per session: Not on file  . Stress: Not on file  Relationships  . Social connections:    Talks on phone: Not on file    Gets together: Not on file    Attends religious service: Not on file    Active member of club or organization: Not on file    Attends meetings of clubs or organizations: Not on file    Relationship status: Not on file  . Intimate partner violence:    Fear of current or ex partner: Not on file    Emotionally abused: Not on file    Physically abused: Not on file    Forced sexual activity: Not on file  Other Topics Concern  . Not on file  Social History Narrative   Married   Education: College   Exercise: Yes   Review of Systems  Constitutional: Negative for fatigue and unexpected weight change.  Eyes: Negative for visual disturbance.  Respiratory: Negative for cough, chest tightness and shortness of breath.   Cardiovascular: Negative for chest pain, palpitations and leg swelling.  Gastrointestinal: Positive for abdominal pain (left sided). Negative for blood in stool.    Musculoskeletal: Positive for arthralgias and joint swelling. Negative for gait problem.  Neurological: Negative for dizziness, light-headedness and headaches.       Objective:   Physical Exam  Constitutional: He is oriented to person, place, and time. He appears well-developed and well-nourished. No distress.  HENT:  Head: Normocephalic and atraumatic.  Eyes: Pupils are equal, round, and reactive to light. EOM are normal.  Neck: Neck supple.  Cardiovascular: Normal rate.  Pulmonary/Chest: Effort normal. No respiratory distress.  Abdominal: Soft. He exhibits no distension. There is no tenderness.  Musculoskeletal:  Normal range of motion.  Left great toe: warmth and soft tissue swelling, tenderness of left great toe and MTP; no significant erythema, no wounds seen, no rash, ankle non tender  Neurological: He is alert and oriented to person, place, and time.  Skin: Skin is warm and dry.  Psychiatric: He has a normal mood and affect. His behavior is normal.  Nursing note and vitals reviewed.   Vitals:   10/22/17 1705  BP: 132/76  Pulse: 71  Temp: 98.5 F (36.9 C)  TempSrc: Oral  SpO2: 99%  Weight: 246 lb 12.8 oz (111.9 kg)  Height: 6' (1.829 m)       Assessment & Plan:    GAYLE COLLARD is a 52 y.o. male Abdominal pain, unspecified abdominal location  - longstanding symptoms, without acute changes. Lumbar source possible, but recommend following bak up with GI.  Constipation prevention and otc options discussed in the interim. rtc precautions if acute worsening.   Acute gout of left foot, unspecified cause - Plan: colchicine 0.6 MG tablet, predniSONE (DELTASONE) 20 MG tablet  - colchicine restarted, prednisone printed if not improving, and planned follow up in few weeks to decide on higher dose of allopurinol with uric acid testing.   Meds ordered this encounter  Medications  . colchicine 0.6 MG tablet    Sig: Take 2 initially, then one tonight, then take one tablet  daily until gout improved    Dispense:  20 tablet    Refill:  1  . predniSONE (DELTASONE) 20 MG tablet    Sig: Take 2 tablets (40 mg total) by mouth daily with breakfast.    Dispense:  10 tablet    Refill:  0   Patient Instructions   Try restarting colchicine today - 2 pills once, then 1 more in an hour if pain is not improving. If that foot pain is not improving in next few days, I printed the prescription for prednisone. If you do take prednisone in a few days then stop the colchicine at that time.   I would like to recheck the uric acid at a visit in next 3-4 weeks to decide if higher dose of allopurinol is needed.   For your abdominal pain that still may be related to constipation. Colace as stool softener or miralax if you are unable to have a bowel movement.  Please call Dr. [name] for appointment to decide on next step.  Return to the clinic or go to the nearest emergency room if any of your symptoms worsen or new symptoms occur.  Water and fiber in diet can also help prevent constipation.    Gout Gout is painful swelling that can occur in some of your joints. Gout is a type of arthritis. This condition is caused by having too much uric acid in your body. Uric acid is a chemical that forms when your body breaks down substances called purines. Purines are important for building body proteins. When your body has too much uric acid, sharp crystals can form and build up inside your joints. This causes pain and swelling. Gout attacks can happen quickly and be very painful (acute gout). Over time, the attacks can affect more joints and become more frequent (chronic gout). Gout can also cause uric acid to build up under your skin and inside your kidneys. What are the causes? This condition is caused by too much uric acid in your blood. This can occur because:  Your kidneys do not remove enough uric acid from  your blood. This is the most common cause.  Your body makes too much uric acid. This  can occur with some cancers and cancer treatments. It can also occur if your body is breaking down too many red blood cells (hemolytic anemia).  You eat too many foods that are high in purines. These foods include organ meats and some seafood. Alcohol, especially beer, is also high in purines.  A gout attack may be triggered by trauma or stress. What increases the risk? This condition is more likely to develop in people who:  Have a family history of gout.  Are male and middle-aged.  Are male and have gone through menopause.  Are obese.  Frequently drink alcohol, especially beer.  Are dehydrated.  Lose weight too quickly.  Have an organ transplant.  Have lead poisoning.  Take certain medicines, including aspirin, cyclosporine, diuretics, levodopa, and niacin.  Have kidney disease or psoriasis.  What are the signs or symptoms? An attack of acute gout happens quickly. It usually occurs in just one joint. The most common place is the big toe. Attacks often start at night. Other joints that may be affected include joints of the feet, ankle, knee, fingers, wrist, or elbow. Symptoms may include:  Severe pain.  Warmth.  Swelling.  Stiffness.  Tenderness. The affected joint may be very painful to touch.  Shiny, red, or purple skin.  Chills and fever.  Chronic gout may cause symptoms more frequently. More joints may be involved. You may also have white or yellow lumps (tophi) on your hands or feet or in other areas near your joints. How is this diagnosed? This condition is diagnosed based on your symptoms, medical history, and physical exam. You may have tests, such as:  Blood tests to measure uric acid levels.  Removal of joint fluid with a needle (aspiration) to look for uric acid crystals.  X-rays to look for joint damage.  How is this treated? Treatment for this condition has two phases: treating an acute attack and preventing future attacks. Acute gout  treatment may include medicines to reduce pain and swelling, including:  NSAIDs.  Steroids. These are strong anti-inflammatory medicines that can be taken by mouth (orally) or injected into a joint.  Colchicine. This medicine relieves pain and swelling when it is taken soon after an attack. It can be given orally or through an IV tube.  Preventive treatment may include:  Daily use of smaller doses of NSAIDs or colchicine.  Use of a medicine that reduces uric acid levels in your blood.  Changes to your diet. You may need to see a specialist about healthy eating (dietitian).  Follow these instructions at home: During a Gout Attack  If directed, apply ice to the affected area: ? Put ice in a plastic bag. ? Place a towel between your skin and the bag. ? Leave the ice on for 20 minutes, 2-3 times a day.  Rest the joint as much as possible. If the affected joint is in your leg, you may be given crutches to use.  Raise (elevate) the affected joint above the level of your heart as often as possible.  Drink enough fluids to keep your urine clear or pale yellow.  Take over-the-counter and prescription medicines only as told by your health care provider.  Do not drive or operate heavy machinery while taking prescription pain medicine.  Follow instructions from your health care provider about eating or drinking restrictions.  Return to your normal activities as told  by your health care provider. Ask your health care provider what activities are safe for you. Avoiding Future Gout Attacks  Follow a low-purine diet as told by your dietitian or health care provider. Avoid foods and drinks that are high in purines, including liver, kidney, anchovies, asparagus, herring, mushrooms, mussels, and beer.  Limit alcohol intake to no more than 1 drink a day for nonpregnant women and 2 drinks a day for men. One drink equals 12 oz of beer, 5 oz of wine, or 1 oz of hard liquor.  Maintain a healthy  weight or lose weight if you are overweight. If you want to lose weight, talk with your health care provider. It is important that you do not lose weight too quickly.  Start or maintain an exercise program as told by your health care provider.  Drink enough fluids to keep your urine clear or pale yellow.  Take over-the-counter and prescription medicines only as told by your health care provider.  Keep all follow-up visits as told by your health care provider. This is important. Contact a health care provider if:  You have another gout attack.  You continue to have symptoms of a gout attack after10 days of treatment.  You have side effects from your medicines.  You have chills or a fever.  You have burning pain when you urinate.  You have pain in your lower back or belly. Get help right away if:  You have severe or uncontrolled pain.  You cannot urinate. This information is not intended to replace advice given to you by your health care provider. Make sure you discuss any questions you have with your health care provider. Document Released: 06/02/2000 Document Revised: 11/11/2015 Document Reviewed: 03/18/2015 Elsevier Interactive Patient Education  2018 [employer].  Low-Purine Diet Purines are compounds that affect the level of uric acid in your body. A low-purine diet is a diet that is low in purines. Eating a low-purine diet can prevent the level of uric acid in your body from getting too high and causing gout or kidney stones or both. What do I need to know about this diet?  Choose low-purine foods. Examples of low-purine foods are listed in the next section.  Drink plenty of fluids, especially water. Fluids can help remove uric acid from your body. Try to drink 8-16 cups (1.9-3.8 L) a day.  Limit foods high in fat, especially saturated fat, as fat makes it harder for the body to get rid of uric acid. Foods high in saturated fat include pizza, cheese, ice cream, whole  milk, fried foods, and gravies. Choose foods that are lower in fat and lean sources of protein. Use olive oil when cooking as it contains healthy fats that are not high in saturated fat.  Limit alcohol. Alcohol interferes with the elimination of uric acid from your body. If you are having a gout attack, avoid all alcohol.  Keep in mind that different people's bodies react differently to different foods. You will probably learn over time which foods do or do not affect you. If you discover that a food tends to cause your gout to flare up, avoid eating that food. You can more freely enjoy foods that do not cause problems. If you have any questions about a food item, talk to your dietitian or health care provider. Which foods are low, moderate, and high in purines? The following is a list of foods that are low, moderate, and high in purines. You can eat any amount  of the foods that are low in purines. You may be able to have small amounts of foods that are moderate in purines. Ask your health care provider how much of a food moderate in purines you can have. Avoid foods high in purines. Grains  Foods low in purines: Enriched white bread, pasta, rice, cake, cornbread, popcorn.  Foods moderate in purines: Whole-grain breads and cereals, wheat germ, bran, oatmeal. Uncooked oatmeal. Dry wheat bran or wheat germ.  Foods high in purines: Pancakes, [place] toast, biscuits, muffins. Vegetables  Foods low in purines: All vegetables, except those that are moderate in purines.  Foods moderate in purines: Asparagus, cauliflower, spinach, mushrooms, green peas. Fruits  All fruits are low in purines. Meats and other Protein Foods  Foods low in purines: Eggs, nuts, peanut butter.  Foods moderate in purines: 80-90% lean beef, lamb, veal, pork, poultry, fish, eggs, peanut butter, nuts. Crab, lobster, oysters, and shrimp. Cooked dried beans, peas, and lentils.  Foods high in purines: Anchovies, sardines,  herring, mussels, tuna, codfish, scallops, trout, and haddock. Tomasa Blase. Organ meats (such as liver or kidney). Tripe. Game meat. Goose. Sweetbreads. Dairy  All dairy foods are low in purines. Low-fat and fat-free dairy products are best because they are low in saturated fat. Beverages  Drinks low in purines: Water, carbonated beverages, tea, coffee, cocoa.  Drinks moderate in purines: Soft drinks and other drinks sweetened with high-fructose corn syrup. Juices. To find whether a food or drink is sweetened with high-fructose corn syrup, look at the ingredients list.  Drinks high in purines: Alcoholic beverages (such as beer). Condiments  Foods low in purines: Salt, herbs, olives, pickles, relishes, vinegar.  Foods moderate in purines: Butter, margarine, oils, mayonnaise. Fats and Oils  Foods low in purines: All types, except gravies and sauces made with meat.  Foods high in purines: Gravies and sauces made with meat. Other Foods  Foods low in purines: Sugars, sweets, gelatin. Cake. Soups made without meat.  Foods moderate in purines: Meat-based or fish-based soups, broths, or bouillons. Foods and drinks sweetened with high-fructose corn syrup.  Foods high in purines: High-fat desserts (such as ice cream, cookies, cakes, pies, doughnuts, and chocolate). Contact your dietitian for more information on foods that are not listed here. This information is not intended to replace advice given to you by your health care provider. Make sure you discuss any questions you have with your health care provider. Document Released: 09/30/2010 Document Revised: 11/11/2015 Document Reviewed: 05/12/2013 Elsevier Interactive Patient Education  2017 [employer].    IF you received an x-ray today, you will receive an invoice from [facility]. Please contact [facility] at [phone] with questions or concerns regarding your invoice.   IF you received labwork today, you will receive  an invoice from [facility]. Please contact [facility] at [phone] with questions or concerns regarding your invoice.   Our billing staff will not be able to assist you with questions regarding bills from these companies.  You will be contacted with the lab results as soon as they are available. The fastest way to get your results is to activate your My Chart account. Instructions are located on the last page of this paperwork. If you have not heard from [place] regarding the results in 2 weeks, please contact this office.       I personally performed the services described in this documentation, which was scribed in my presence. The recorded information has been reviewed and considered for accuracy and completeness, addended by  me as needed, and agree with information above.  Signed,   Raymond Staggers, MD [facility].  10/24/17 10:37 PM

## 2017-10-23 MED FILL — COLCHICINE 0.6 MG TABS: 0.6 | 18 days supply | Qty: 20 | Fill #0

## 2017-10-24 ENCOUNTER — Encounter: Payer: Self-pay | Admitting: Family Medicine

## 2017-11-22 ENCOUNTER — Ambulatory Visit (INDEPENDENT_AMBULATORY_CARE_PROVIDER_SITE_OTHER): Payer: 59 | Admitting: Family Medicine

## 2017-11-22 ENCOUNTER — Encounter: Payer: Self-pay | Admitting: Family Medicine

## 2017-11-22 ENCOUNTER — Other Ambulatory Visit: Payer: Self-pay

## 2017-11-22 VITALS — BP 118/74 | HR 55 | Temp 98.5°F | Resp 18 | Ht 72.0 in | Wt 251.6 lb

## 2017-11-22 DIAGNOSIS — M109 Gout, unspecified: Secondary | ICD-10-CM | POA: Diagnosis not present

## 2017-11-22 DIAGNOSIS — R109 Unspecified abdominal pain: Secondary | ICD-10-CM | POA: Diagnosis not present

## 2017-11-22 DIAGNOSIS — G8929 Other chronic pain: Secondary | ICD-10-CM

## 2017-11-22 NOTE — Patient Instructions (Addendum)
I will check the uric acid level again today, and if that is over 6, would recommend increasing your allopurinol to 200 mg each day.  Let me know if there are other questions. See more information below on gout.    Call Dr. [name] for appointment regarding abdominal symptoms.  The MRI previously showed some degenerative changes/arthritis in the hips, but as you are not having pain in that area currently I do not think referral to orthopedist is needed at this time.  If you are having more pain in the pelvis or the hip area, let me know and I am happy to refer you to orthopedics.  Thank you for coming in today.   Gout Gout is painful swelling that can occur in some of your joints. Gout is a type of arthritis. This condition is caused by having too much uric acid in your body. Uric acid is a chemical that forms when your body breaks down substances called purines. Purines are important for building body proteins. When your body has too much uric acid, sharp crystals can form and build up inside your joints. This causes pain and swelling. Gout attacks can happen quickly and be very painful (acute gout). Over time, the attacks can affect more joints and become more frequent (chronic gout). Gout can also cause uric acid to build up under your skin and inside your kidneys. What are the causes? This condition is caused by too much uric acid in your blood. This can occur because:  Your kidneys do not remove enough uric acid from your blood. This is the most common cause.  Your body makes too much uric acid. This can occur with some cancers and cancer treatments. It can also occur if your body is breaking down too many red blood cells (hemolytic anemia).  You eat too many foods that are high in purines. These foods include organ meats and some seafood. Alcohol, especially beer, is also high in purines.  A gout attack may be triggered by trauma or stress. What increases the risk? This condition is more  likely to develop in people who:  Have a family history of gout.  Are male and middle-aged.  Are male and have gone through menopause.  Are obese.  Frequently drink alcohol, especially beer.  Are dehydrated.  Lose weight too quickly.  Have an organ transplant.  Have lead poisoning.  Take certain medicines, including aspirin, cyclosporine, diuretics, levodopa, and niacin.  Have kidney disease or psoriasis.  What are the signs or symptoms? An attack of acute gout happens quickly. It usually occurs in just one joint. The most common place is the big toe. Attacks often start at night. Other joints that may be affected include joints of the feet, ankle, knee, fingers, wrist, or elbow. Symptoms may include:  Severe pain.  Warmth.  Swelling.  Stiffness.  Tenderness. The affected joint may be very painful to touch.  Shiny, red, or purple skin.  Chills and fever.  Chronic gout may cause symptoms more frequently. More joints may be involved. You may also have white or yellow lumps (tophi) on your hands or feet or in other areas near your joints. How is this diagnosed? This condition is diagnosed based on your symptoms, medical history, and physical exam. You may have tests, such as:  Blood tests to measure uric acid levels.  Removal of joint fluid with a needle (aspiration) to look for uric acid crystals.  X-rays to look for joint damage.  How is  this treated? Treatment for this condition has two phases: treating an acute attack and preventing future attacks. Acute gout treatment may include medicines to reduce pain and swelling, including:  NSAIDs.  Steroids. These are strong anti-inflammatory medicines that can be taken by mouth (orally) or injected into a joint.  Colchicine. This medicine relieves pain and swelling when it is taken soon after an attack. It can be given orally or through an IV tube.  Preventive treatment may include:  Daily use of smaller doses  of NSAIDs or colchicine.  Use of a medicine that reduces uric acid levels in your blood.  Changes to your diet. You may need to see a specialist about healthy eating (dietitian).  Follow these instructions at home: During a Gout Attack  If directed, apply ice to the affected area: ? Put ice in a plastic bag. ? Place a towel between your skin and the bag. ? Leave the ice on for 20 minutes, 2-3 times a day.  Rest the joint as much as possible. If the affected joint is in your leg, you may be given crutches to use.  Raise (elevate) the affected joint above the level of your heart as often as possible.  Drink enough fluids to keep your urine clear or pale yellow.  Take over-the-counter and prescription medicines only as told by your health care provider.  Do not drive or operate heavy machinery while taking prescription pain medicine.  Follow instructions from your health care provider about eating or drinking restrictions.  Return to your normal activities as told by your health care provider. Ask your health care provider what activities are safe for you. Avoiding Future Gout Attacks  Follow a low-purine diet as told by your dietitian or health care provider. Avoid foods and drinks that are high in purines, including liver, kidney, anchovies, asparagus, herring, mushrooms, mussels, and beer.  Limit alcohol intake to no more than 1 drink a day for nonpregnant women and 2 drinks a day for men. One drink equals 12 oz of beer, 5 oz of wine, or 1 oz of hard liquor.  Maintain a healthy weight or lose weight if you are overweight. If you want to lose weight, talk with your health care provider. It is important that you do not lose weight too quickly.  Start or maintain an exercise program as told by your health care provider.  Drink enough fluids to keep your urine clear or pale yellow.  Take over-the-counter and prescription medicines only as told by your health care provider.  Keep  all follow-up visits as told by your health care provider. This is important. Contact a health care provider if:  You have another gout attack.  You continue to have symptoms of a gout attack after10 days of treatment.  You have side effects from your medicines.  You have chills or a fever.  You have burning pain when you urinate.  You have pain in your lower back or belly. Get help right away if:  You have severe or uncontrolled pain.  You cannot urinate. This information is not intended to replace advice given to you by your health care provider. Make sure you discuss any questions you have with your health care provider. Document Released: 06/02/2000 Document Revised: 11/11/2015 Document Reviewed: 03/18/2015 Elsevier Interactive Patient Education  2018 [employer]   IF you received an x-ray today, you will receive [facility] invoice [facility]. Please contact [facility] at [phone] with questions or concerns regarding your  invoice.   IF you received labwork today, you will receive an invoice from [facility]. Please contact [facility] at [phone] with questions or concerns regarding your invoice.   Our billing staff will not be able to assist you with questions regarding bills from these companies.  You will be contacted with the lab results as soon as they are available. The fastest way to get your results is to activate your My Chart account. Instructions are located on the last page of this paperwork. If you have not heard from [place] regarding the results in 2 weeks, please contact this office.

## 2017-11-22 NOTE — Progress Notes (Signed)
Subjective:    Patient ID: Raymond Green, male    DOB: 1966-04-14, 52 y.o.   MRN: [id number]  HPI Raymond Green is a 52 y.o. male Presents today for: Chief Complaint  Patient presents with  . Gout    follow up and also labs     Abdominal pain: Discussed with Lasix office visit.  We will send symptoms without any acute changes at that time.  Did discuss possible lumbar source but recommend he follow back up with gastroenterologist.  Did have some possible history of constipation and constipation prevention, over-the-counter treatment options were discussed until he was able to see his gastroenterologist. Has not been able to contact Dr. Ewing SchleinMagod for follow up yet. No new symptoms. Similar episodic discomfort. Wanted to see if specialist needed for arthritis seen on MR pelvis. Normal pelvis bony findings on MRI 08/20/16. Mild to moderate bilateral hip joint degenerative changes for age on that MRI, but denies any hip or pelvic pain. Denies recent constipation.   Gout: Suspected gout of left foot last visit.  Colchicine was restarted, option of prednisone if he was not improving with treatment with colchicine.  Delayed uric acid testing due to acute flare at that time. Lab Results  Component Value Date   LABURIC 6.4 01/02/2017   Restarted colchicine last visit. Pain improved in 3-4 days. Did not need prednisone. 3 gout flairs in past 1 year.  Taking 1 colchicine per day and allopurinol 100mg  per day at this time. No new side effects, denies diarrhea.    Patient Active Problem List   Diagnosis Date Noted  . Low back pain 07/13/2016  . Abdominal pain, left lower quadrant 05/17/2016  . Elevated serum creatinine 05/29/2015  . Gout 10/29/2013  . HYPERTENSION 03/02/2008  . GERD 03/02/2008  . Iron deficiency anemia 12/13/2007  . HEMORRHOIDS-EXTERNAL 12/13/2007   Past Medical History:  Diagnosis Date  . Allergy   . Anemia   . Headache(784.0)   . Hemorrhoids   . Hypertension   .  Kidney insufficiency   . LVH (left ventricular hypertrophy)    Dr. Donnie Ahoilley ( cardiology)  . Obesity   . OSA (obstructive sleep apnea)   . Positive PPD, treated 2000   INH  . Vitamin D deficiency    Past Surgical History:  Procedure Laterality Date  . COLONOSCOPY  12/28/2011   Procedure: COLONOSCOPY;  Surgeon: Rachael Feeaniel P Jacobs, MD;  Location: [facility];  Service: Endoscopy;  Laterality: N/A;  . HEMORRHOID SURGERY     Allergies  Allergen Reactions  . Lisinopril     angioedema  . Shrimp [Shellfish Allergy] Nausea And Vomiting   Prior to Admission medications   Medication Sig Start Date End Date Taking? Authorizing Provider  allopurinol (ZYLOPRIM) 100 MG tablet Take 1 tablet (100 mg total) by mouth daily. 01/02/17  Yes Benjiman CoreWiseman, Brittany D, PA-C  colchicine 0.6 MG tablet Take 2 initially, then one tonight, then take one tablet daily until gout improved 10/22/17  Yes Shade FloodGreene, [place] Phifer R, MD  docusate sodium (COLACE) 100 MG capsule Take 100 mg by mouth daily.   Yes [provider]  losartan (COZAAR) 100 MG tablet TAKE 1 TABLET BY MOUTH ONCE DAILY 11/07/16  Yes Barnett AbuWiseman, [place] D, PA-C  Nebivolol HCl (BYSTOLIC) 20 MG TABS Take 1 tablet (20 mg total) by mouth daily. 01/02/17  Yes Barnett AbuWiseman, [place] D, PA-C  omeprazole (PRILOSEC) 40 MG capsule TAKE 1 CAPSULE BY MOUTH DAILY. 07/09/17  Yes Ofilia Neaslark, Michael L, PA-C  predniSONE (DELTASONE) 20 MG tablet Take 2 tablets (40 mg total) by mouth daily with breakfast. 10/22/17  Yes Shade Flood, MD  vitamin B-12 (CYANOCOBALAMIN) 100 MCG tablet Take 100 mcg by mouth daily.   Yes [provider]   Social History   Socioeconomic History  . Marital status: Married    Spouse name: Not on file  . Number of children: Not on file  . Years of education: Not on file  . Highest education level: Not on file  Occupational History  . Occupation: Endoscopy Technician  Social Needs  . Financial resource strain: Not on file  . Food insecurity:     Worry: Not on file    Inability: Not on file  . Transportation needs:    Medical: Not on file    Non-medical: Not on file  Tobacco Use  . Smoking status: Never Smoker  . Smokeless tobacco: Never Used  Substance and Sexual Activity  . Alcohol use: No    Alcohol/week: 0.0 oz  . Drug use: No  . Sexual activity: Yes  Lifestyle  . Physical activity:    Days per week: Not on file    Minutes per session: Not on file  . Stress: Not on file  Relationships  . Social connections:    Talks on phone: Not on file    Gets together: Not on file    Attends religious service: Not on file    Active member of club or organization: Not on file    Attends meetings of clubs or organizations: Not on file    Relationship status: Not on file  . Intimate partner violence:    Fear of current or ex partner: Not on file    Emotionally abused: Not on file    Physically abused: Not on file    Forced sexual activity: Not on file  Other Topics Concern  . Not on file  Social History Narrative   Married   Education: College   Exercise: Yes    Review of Systems  Gastrointestinal: Positive for abdominal pain. Negative for constipation.  Musculoskeletal: Negative for arthralgias and gait problem.  Skin: Negative for color change.      Objective:   Physical Exam  Constitutional: He appears well-developed and well-nourished. No distress.  Pulmonary/Chest: Effort normal.  Musculoskeletal: Normal range of motion. He exhibits no edema, tenderness or deformity.       Left ankle: Normal. No tenderness.       Left foot: Normal. There is no tenderness, no bony tenderness and no swelling.   Vitals:   11/22/17 1548  BP: 118/74  Pulse: (!) 55  Resp: 18  Temp: 98.5 F (36.9 C)  TempSrc: Oral  SpO2: 100%  Weight: 251 lb 9.6 oz (114.1 kg)  Height: 6' (1.829 m)       Assessment & Plan:  Raymond Green is a 52 y.o. male Gout of left foot, unspecified cause, unspecified chronicity - Plan: Uric  Acid  -Acute gout flareup left foot is now resolved.  Improved with just colchicine.  Due to 3 flares in the past year we will check uric acid and if over 6 would recommend increasing allopurinol to 200 mg per day.  Chronic abdominal pain without acute/recent changes.  Recommend he did follow-up with gastroenterologist.  There was a question regarding arthritis noted on prior MRI.  He describes his pain as abdominal, not pelvic or hip pain.  Deferred referral at this time but if he does  have pain in those areas or with ambulation would recommend evaluation further with orthopedist.   No orders of the defined types were placed in this encounter.  Patient Instructions   I will check the uric acid level again today, and if that is over 6, would recommend increasing your allopurinol to 200 mg each day.  Let me know if there are other questions. See more information below on gout.    Call Dr. [name] for appointment regarding abdominal symptoms.  The MRI previously showed some degenerative changes/arthritis in the hips, but as you are not having pain in that area currently I do not think referral to orthopedist is needed at this time.  If you are having more pain in the pelvis or the hip area, let me know and I am happy to refer you to orthopedics.  Thank you for coming in today.   Gout Gout is painful swelling that can occur in some of your joints. Gout is a type of arthritis. This condition is caused by having too much uric acid in your body. Uric acid is a chemical that forms when your body breaks down substances called purines. Purines are important for building body proteins. When your body has too much uric acid, sharp crystals can form and build up inside your joints. This causes pain and swelling. Gout attacks can happen quickly and be very painful (acute gout). Over time, the attacks can affect more joints and become more frequent (chronic gout). Gout can also cause uric acid to build up under your  skin and inside your kidneys. What are the causes? This condition is caused by too much uric acid in your blood. This can occur because:  Your kidneys do not remove enough uric acid from your blood. This is the most common cause.  Your body makes too much uric acid. This can occur with some cancers and cancer treatments. It can also occur if your body is breaking down too many red blood cells (hemolytic anemia).  You eat too many foods that are high in purines. These foods include organ meats and some seafood. Alcohol, especially beer, is also high in purines.  A gout attack may be triggered by trauma or stress. What increases the risk? This condition is more likely to develop in people who:  Have a family history of gout.  Are male and middle-aged.  Are male and have gone through menopause.  Are obese.  Frequently drink alcohol, especially beer.  Are dehydrated.  Lose weight too quickly.  Have an organ transplant.  Have lead poisoning.  Take certain medicines, including aspirin, cyclosporine, diuretics, levodopa, and niacin.  Have kidney disease or psoriasis.  What are the signs or symptoms? An attack of acute gout happens quickly. It usually occurs in just one joint. The most common place is the big toe. Attacks often start at night. Other joints that may be affected include joints of the feet, ankle, knee, fingers, wrist, or elbow. Symptoms may include:  Severe pain.  Warmth.  Swelling.  Stiffness.  Tenderness. The affected joint may be very painful to touch.  Shiny, red, or purple skin.  Chills and fever.  Chronic gout may cause symptoms more frequently. More joints may be involved. You may also have white or yellow lumps (tophi) on your hands or feet or in other areas near your joints. How is this diagnosed? This condition is diagnosed based on your symptoms, medical history, and physical exam. You may have tests, such as:  Blood tests to measure uric  acid levels.  Removal of joint fluid with a needle (aspiration) to look for uric acid crystals.  X-rays to look for joint damage.  How is this treated? Treatment for this condition has two phases: treating an acute attack and preventing future attacks. Acute gout treatment may include medicines to reduce pain and swelling, including:  NSAIDs.  Steroids. These are strong anti-inflammatory medicines that can be taken by mouth (orally) or injected into a joint.  Colchicine. This medicine relieves pain and swelling when it is taken soon after an attack. It can be given orally or through an IV tube.  Preventive treatment may include:  Daily use of smaller doses of NSAIDs or colchicine.  Use of a medicine that reduces uric acid levels in your blood.  Changes to your diet. You may need to see a specialist about healthy eating (dietitian).  Follow these instructions at home: During a Gout Attack  If directed, apply ice to the affected area: ? Put ice in a plastic bag. ? Place a towel between your skin and the bag. ? Leave the ice on for 20 minutes, 2-3 times a day.  Rest the joint as much as possible. If the affected joint is in your leg, you may be given crutches to use.  Raise (elevate) the affected joint above the level of your heart as often as possible.  Drink enough fluids to keep your urine clear or pale yellow.  Take over-the-counter and prescription medicines only as told by your health care provider.  Do not drive or operate heavy machinery while taking prescription pain medicine.  Follow instructions from your health care provider about eating or drinking restrictions.  Return to your normal activities as told by your health care provider. Ask your health care provider what activities are safe for you. Avoiding Future Gout Attacks  Follow a low-purine diet as told by your dietitian or health care provider. Avoid foods and drinks that are high in purines, including  liver, kidney, anchovies, asparagus, herring, mushrooms, mussels, and beer.  Limit alcohol intake to no more than 1 drink a day for nonpregnant women and 2 drinks a day for men. One drink equals 12 oz of beer, 5 oz of wine, or 1 oz of hard liquor.  Maintain a healthy weight or lose weight if you are overweight. If you want to lose weight, talk with your health care provider. It is important that you do not lose weight too quickly.  Start or maintain an exercise program as told by your health care provider.  Drink enough fluids to keep your urine clear or pale yellow.  Take over-the-counter and prescription medicines only as told by your health care provider.  Keep all follow-up visits as told by your health care provider. This is important. Contact a health care provider if:  You have another gout attack.  You continue to have symptoms of a gout attack after10 days of treatment.  You have side effects from your medicines.  You have chills or a fever.  You have burning pain when you urinate.  You have pain in your lower back or belly. Get help right away if:  You have severe or uncontrolled pain.  You cannot urinate. This information is not intended to replace advice given to you by your health care provider. Make sure you discuss any questions you have with your health care provider. Document Released: 06/02/2000 Document Revised: 11/11/2015 Document Reviewed: 03/18/2015 Elsevier Interactive Patient Education  2018 [employer]   IF you received an x-ray today, you will receive an invoice from [facility]. Please contact [facility] at [phone] with questions or concerns regarding your invoice.   IF you received labwork today, you will receive an invoice from [facility]. Please contact [facility] at [phone] with questions or concerns regarding your invoice.   Our billing staff will not be able to assist you with questions regarding bills from these  companies.  You will be contacted with the lab results as soon as they are available. The fastest way to get your results is to activate your My Chart account. Instructions are located on the last page of this paperwork. If you have not heard from [place] regarding the results in 2 weeks, please contact this office.      Signed,   [name], MD [facility] at [facility].  11/22/17 4:50 PM

## 2017-11-23 LAB — URIC ACID: URIC ACID: 6.7 mg/dL (ref 3.7–8.6)

## 2017-12-10 ENCOUNTER — Other Ambulatory Visit: Payer: Self-pay | Admitting: Physician Assistant

## 2017-12-10 DIAGNOSIS — K219 Gastro-esophageal reflux disease without esophagitis: Secondary | ICD-10-CM

## 2017-12-10 MED FILL — SPIRONOLACTONE 25 MG TABLET: 25 | 30 days supply | Qty: 30 | Fill #3

## 2017-12-10 MED FILL — COLCHICINE 0.6 MG TABS: 0.6 | 18 days supply | Qty: 20 | Fill #1

## 2017-12-10 MED FILL — ALLOPURINOL 100 MG TABLET: 100 | 90 days supply | Qty: 90 | Fill #1

## 2017-12-11 ENCOUNTER — Ambulatory Visit (INDEPENDENT_AMBULATORY_CARE_PROVIDER_SITE_OTHER): Payer: 59 | Admitting: Family Medicine

## 2017-12-11 ENCOUNTER — Encounter: Payer: Self-pay | Admitting: Family Medicine

## 2017-12-11 VITALS — BP 135/72 | HR 51 | Temp 98.2°F | Resp 17 | Ht 72.0 in | Wt 248.0 lb

## 2017-12-11 DIAGNOSIS — Z13220 Encounter for screening for lipoid disorders: Secondary | ICD-10-CM | POA: Diagnosis not present

## 2017-12-11 DIAGNOSIS — Z23 Encounter for immunization: Secondary | ICD-10-CM | POA: Diagnosis not present

## 2017-12-11 DIAGNOSIS — R3989 Other symptoms and signs involving the genitourinary system: Secondary | ICD-10-CM

## 2017-12-11 DIAGNOSIS — R351 Nocturia: Secondary | ICD-10-CM

## 2017-12-11 DIAGNOSIS — Z125 Encounter for screening for malignant neoplasm of prostate: Secondary | ICD-10-CM

## 2017-12-11 DIAGNOSIS — M109 Gout, unspecified: Secondary | ICD-10-CM | POA: Diagnosis not present

## 2017-12-11 DIAGNOSIS — I10 Essential (primary) hypertension: Secondary | ICD-10-CM

## 2017-12-11 DIAGNOSIS — K219 Gastro-esophageal reflux disease without esophagitis: Secondary | ICD-10-CM

## 2017-12-11 DIAGNOSIS — Z0000 Encounter for general adult medical examination without abnormal findings: Secondary | ICD-10-CM

## 2017-12-11 DIAGNOSIS — R350 Frequency of micturition: Secondary | ICD-10-CM | POA: Diagnosis not present

## 2017-12-11 LAB — POCT URINALYSIS DIP (MANUAL ENTRY)
Bilirubin, UA: NEGATIVE
Glucose, UA: NEGATIVE mg/dL
Ketones, POC UA: NEGATIVE mg/dL
Leukocytes, UA: NEGATIVE
Nitrite, UA: NEGATIVE
PH UA: 5.5 (ref 5.0–8.0)
PROTEIN UA: NEGATIVE mg/dL
RBC UA: NEGATIVE
SPEC GRAV UA: 1.01 (ref 1.010–1.025)
UROBILINOGEN UA: 0.2 U/dL

## 2017-12-11 LAB — GLUCOSE, POCT (MANUAL RESULT ENTRY): POC GLUCOSE: 73 mg/dL (ref 70–99)

## 2017-12-11 MED ORDER — ZOSTER VAC RECOMB ADJUVANTED 50 MCG/0.5ML IM SUSR: 0.5000 mL | Freq: Once | INTRAMUSCULAR | 1 refills | Status: AC

## 2017-12-11 MED ORDER — OMEPRAZOLE 40 MG PO CPDR: 40.0000 mg | DELAYED_RELEASE_CAPSULE | Freq: Every day | ORAL | 0 refills | Status: DC

## 2017-12-11 MED FILL — AMLODIPINE BESYLATE 10 MG T: 10 | 90 days supply | Qty: 90 | Fill #0

## 2017-12-11 MED FILL — OMEPRAZOLE 40 MG CPDR: 40 | 90 days supply | Qty: 90 | Fill #0

## 2017-12-11 MED FILL — SHINGRIX 50 MCG SUS: 50 | 1 days supply | Qty: 1 | Fill #0

## 2017-12-11 NOTE — Patient Instructions (Addendum)
I will check a prostate test to see if that may be causing the nighttime urination. See other info below. I will also refer you to urology to repeat the prostate test as it may be slightly enlarged.   Follow up with gastroenterology as planned and discuss omeprazole use at that visit.   Return to the clinic or go to the nearest emergency room if any of your symptoms worsen or new symptoms occur.  Thank you for coming in today.   Keeping you healthy  Get these tests  Blood pressure- Have your blood pressure checked once a year by your healthcare provider.  Normal blood pressure is 120/80  Weight- Have your body mass index (BMI) calculated to screen for obesity.  BMI is a measure of body fat based on height and weight. You can also calculate your own BMI at [email or web address]  Cholesterol- Have your cholesterol checked every year.  Diabetes- Have your blood sugar checked regularly if you have high blood pressure, high cholesterol, have a family history of diabetes or if you are overweight.  Screening for Colon Cancer- Colonoscopy starting at age 52.  Screening may begin sooner depending on your family history and other health conditions. Follow up colonoscopy as directed by your Gastroenterologist.  Screening for Prostate Cancer- Both blood work (PSA) and a rectal exam help screen for Prostate Cancer.  Screening begins at age 52 with African-American men and at age 52 with Caucasian men.  Screening may begin sooner depending on your family history.   Take these medicines  Flu shot- Every fall.  Tetanus- Every 10 years.  Zostavax- Once after the age of 52 to prevent Shingles.  Pneumonia shot- Once after the age of 52; if you are younger than 2065, ask your healthcare provider if you need a Pneumonia shot.  Take these steps  Don't smoke- If you do smoke, talk to your doctor about quitting.  For tips on how to quit, go to www.smokefree.gov or call 1-800-QUIT-NOW.  Be  physically active- Exercise 5 days a week for at least 30 minutes.  If you are not already physically active start slow and gradually work up to 30 minutes of moderate physical activity.  Examples of moderate activity include walking briskly, mowing the yard, dancing, swimming, bicycling, etc.  Eat a healthy diet- Eat a variety of healthy food such as fruits, vegetables, low fat milk, low fat cheese, yogurt, lean meant, poultry, fish, beans, tofu, etc. For more information go to www.thenutritionsource.org  Drink alcohol in moderation- Limit alcohol intake to less than two drinks a day. Never drink and drive.  Dentist- Brush and floss twice daily; visit your dentist twice a year.  Depression- Your emotional health is as important as your physical health. If you're feeling down, or losing interest in things you would normally enjoy please talk to your healthcare provider.  Eye exam- Visit your eye doctor every year.  Safe sex- If you may be exposed to a sexually transmitted infection, use a condom.  Seat belts- Seat belts can save your life; always wear one.  Smoke/Carbon Monoxide detectors- These detectors need to be installed on the appropriate level of your home.  Replace batteries at least once a year.  Skin cancer- When out in the sun, cover up and use sunscreen 15 SPF or higher.  Violence- If anyone is threatening you, please tell your healthcare provider.  Living Will/ Health care power of attorney- Speak with your healthcare provider and family. Urinary Frequency, Adult Urinary frequency  means urinating more often than usual. People with urinary frequency urinate at least 8 times in 24 hours, even if they drink a normal amount of fluid. Although they urinate more often than normal, the total amount of urine produced in a day may be normal. Urinary frequency is also called pollakiuria. What are the causes? This condition may be caused by:  A urinary tract  infection.  Obesity.  Bladder problems, such as bladder stones.  Caffeine or alcohol.  Eating food or drinking fluids that irritate the bladder. These include coffee, tea, soda, artificial sweeteners, citrus, tomato-based foods, and chocolate.  Certain medicines, such as medicines that help the body get rid of extra fluid (diuretics).  Muscle or nerve weakness.  Overactive bladder.  Chronic diabetes.  Interstitial cystitis.  In men, problems with the prostate, such as an enlarged prostate.  In women, pregnancy.  In some cases, the cause may not be known. What increases the risk? This condition is more likely to develop in:  Women who have gone through menopause.  Men with prostate problems.  People with a disease or injury that affects the nerves or spinal cord.  People who have or have had a condition that affects the brain, such as a stroke.  What are the signs or symptoms? Symptoms of this condition include:  Feeling an urgent need to urinate often. The stress and anxiety of needing to find a bathroom quickly can make this urge worse.  Urinating 8 or more times in 24 hours.  Urinating as often as every 1 to 2 hours.  How is this diagnosed? This condition is diagnosed based on your symptoms, your medical history, and a physical exam. You may have tests, such as:  Blood tests.  Urine tests.  Imaging tests, such as X-rays or ultrasounds.  A bladder test.  A test of your neurological system. This is the body system that senses the need to urinate.  A test to check for problems in the urethra and bladder called cystoscopy.  You may also be asked to keep a bladder diary. A bladder diary is a record of what you eat and drink, how often you urinate, and how much you urinate. You may need to see a health care provider who specializes in conditions of the urinary tract (urologist) or kidneys (nephrologist). How is this treated? Treatment for this condition  depends on the cause. Sometimes the condition goes away on its own and treatment is not necessary. If treatment is needed, it may include:  Taking medicine.  Learning exercises that strengthen the muscles that help control urination.  Following a bladder training program. This may include: ? Learning to delay going to the bathroom. ? Double urinating (voiding). This helps if you are not completely emptying your bladder. ? Scheduled voiding.  Making diet changes, such as: ? Avoiding caffeine. ? Drinking fewer fluids, especially alcohol. ? Not drinking in the evening. ? Not having foods or drinks that may irritate the bladder. ? Eating foods that help prevent or ease constipation. Constipation can make this condition worse.  Having the nerves in your bladder stimulated. There are two options for stimulating the nerves to your bladder: ? Outpatient electrical nerve stimulation. This is done by your health care provider. ? Surgery to implant a bladder pacemaker. The pacemaker helps to control the urge to urinate.  Follow these instructions at home:  Keep a bladder diary if told to by your health care provider.  Take over-the-counter and prescription medicines only  as told by your health care provider.  Do any exercises as told by your health care provider.  Follow a bladder training program as told by your health care provider.  Make any recommended diet changes.  Keep all follow-up visits as told by your health care provider. This is important. Contact a health care provider if:  You start urinating more often.  You feel pain or irritation when you urinate.  You notice blood in your urine.  Your urine looks cloudy.  You develop a fever.  You begin vomiting. Get help right away if:  You are unable to urinate. This information is not intended to replace advice given to you by your health care provider. Make sure you discuss any questions you have with your health care  provider. Document Released: 04/01/2009 Document Revised: 07/07/2015 Document Reviewed: 12/30/2014 Elsevier Interactive Patient Education  2018 [employer].    IF you received an x-ray today, you will receive an invoice from [facility]. Please contact [facility] at [phone] with questions or concerns regarding your invoice.   IF you received labwork today, you will receive an invoice from [facility]. Please contact [facility] at [phone] with questions or concerns regarding your invoice.   Our billing staff will not be able to assist you with questions regarding bills from these companies.  You will be contacted with the lab results as soon as they are available. The fastest way to get your results is to activate your My Chart account. Instructions are located on the last page of this paperwork. If you have not heard from [place] regarding the results in 2 weeks, please contact this office.

## 2017-12-11 NOTE — Progress Notes (Signed)
Subjective:  By signing my name below, I, [name], attest that this documentation has been prepared under the direction and in the presence of [name], MD. Electronically Signed: [name], Scribe. 12/11/2017 , 3:50 PM .  Patient was seen in Room 2 .   Patient ID: Raymond Green, male    DOB: October 11, 1965, 52 y.o.   MRN: [id number] Chief Complaint  Patient presents with  . Annual Exam    urine collected    HPI Raymond Green is a 52 y.o. male Here for annual physical. He has a history of gout, HTN with LVH, GERD, and chronic recurrent abdominal pain. He is fasting today.   ROS complaints Abdominal pain: He hasn't contacted Dr. [name] yet, no changes.  Frequent urination: nocturia 3-4x a night for past 3 months. He notes drinking plenty of water during the day, frequent urination during the day as well. Prior to 3 months, nocturia about once a night.  Seasonal allergies/food allergies: no changes.   Gout See office visit from a few weeks ago. He was taking colchicine 1 a day as well allopurinol 100 mg. Option of increasing dose of allopurinol if recurrence.   Lab Results  Component Value Date   LABURIC 6.7 11/22/2017    HTN with LVH BP Readings from Last 3 Encounters:  12/11/17 135/72  11/22/17 118/74  10/22/17 132/76   Lab Results  Component Value Date   CREATININE 1.35 (H) 01/02/2017   He takes losartan 100 mg and Bystolic 20 mg qd. He's followed by cardiologist, Dr. Donnie Aho.   GERD He takes omeprazole 40 mg qd. Plans to follow up with GI, Dr. [name], for recurrent abdominal discomforts; see prior visits. He hasn't contacted GI yet.   Cancer Screening Colonoscopy: done 09/24/14 with Dr. Madilyn Fireman at [facility]; internal hemorrhoids and single 4 mm polyp; polypoid mucosa, no adenoma.   Prostate cancer screening:  Lab Results  Component Value Date   PSA 0.37 09/01/2015   PSA 0.27 12/26/2013    Immunizations Immunization History  Administered  Date(s) Administered  . Influenza,inj,Quad PF,6+ Mos 03/23/2014, 02/22/2015  . Influenza-Unspecified 03/19/2017  . Meningococcal Conjugate 01/12/2014  . Tdap 09/01/2015   Shingles vaccine: prescription sent to pharmacy.   STI testing: he's married, no extramarital sexual activity.   Depression Depression screen [facility] 2/9 12/11/2017 11/22/2017 10/22/2017 07/27/2017 01/02/2017  Decreased Interest 0 0 0 0 0  Down, Depressed, Hopeless 0 0 0 0 0  PHQ - 2 Score 0 0 0 0 0     Vision  Visual Acuity Screening   Right eye Left eye Both eyes  Without correction:     With correction: 20/20 20/20 20/20    He plans to see eye doctor in the next few months.   Dentist He hasn't seen dentist recently, will do in the next few months.   Exercise He exercises with working.   Lipid screening Lab Results  Component Value Date   CHOL 178 10/19/2016   HDL 49 10/19/2016   LDLCALC 108 (H) 10/19/2016   TRIG 103 10/19/2016   CHOLHDL 3.6 10/19/2016   Plan for change in diet.    Patient Active Problem List   Diagnosis Date Noted  . Low back pain 07/13/2016  . Abdominal pain, left lower quadrant 05/17/2016  . Elevated serum creatinine 05/29/2015  . Gout 10/29/2013  . HYPERTENSION 03/02/2008  . GERD 03/02/2008  . Iron deficiency anemia 12/13/2007  . HEMORRHOIDS-EXTERNAL 12/13/2007   Past Medical History:  Diagnosis  Date  . Allergy   . Anemia   . Headache(784.0)   . Hemorrhoids   . Hypertension   . Kidney insufficiency   . LVH (left ventricular hypertrophy)    Dr. Donnie Aho ( cardiology)  . Obesity   . OSA (obstructive sleep apnea)   . Positive PPD, treated 2000   INH  . Vitamin D deficiency    Past Surgical History:  Procedure Laterality Date  . COLONOSCOPY  12/28/2011   Procedure: COLONOSCOPY;  Surgeon: [name], MD;  Location: [facility];  Service: Endoscopy;  Laterality: N/A;  . HEMORRHOID SURGERY     Allergies  Allergen Reactions  . Lisinopril     angioedema  . Shrimp  [Shellfish Allergy] Nausea And Vomiting   Prior to Admission medications   Medication Sig Start Date End Date Taking? Authorizing Provider  allopurinol (ZYLOPRIM) 100 MG tablet Take 1 tablet (100 mg total) by mouth daily. 01/02/17   Benjiman Core D, PA-C  colchicine 0.6 MG tablet Take 2 initially, then one tonight, then take one tablet daily until gout improved 10/22/17   Shade Flood, MD  docusate sodium (COLACE) 100 MG capsule Take 100 mg by mouth daily.    [provider]  losartan (COZAAR) 100 MG tablet TAKE 1 TABLET BY MOUTH ONCE DAILY 11/07/16   Benjiman Core D, PA-C  Nebivolol HCl (BYSTOLIC) 20 MG TABS Take 1 tablet (20 mg total) by mouth daily. 01/02/17   Benjiman Core D, PA-C  omeprazole (PRILOSEC) 40 MG capsule TAKE 1 CAPSULE BY MOUTH DAILY. 07/09/17   [name], PA-C  predniSONE (DELTASONE) 20 MG tablet Take 2 tablets (40 mg total) by mouth daily with breakfast. 10/22/17   Shade Flood, MD  vitamin B-12 (CYANOCOBALAMIN) 100 MCG tablet Take 100 mcg by mouth daily.    [provider]   Social History   Socioeconomic History  . Marital status: Married    Spouse name: Not on file  . Number of children: Not on file  . Years of education: Not on file  . Highest education level: Not on file  Occupational History  . Occupation: Endoscopy Technician  Social Needs  . Financial resource strain: Not on file  . Food insecurity:    Worry: Not on file    Inability: Not on file  . Transportation needs:    Medical: Not on file    Non-medical: Not on file  Tobacco Use  . Smoking status: Never Smoker  . Smokeless tobacco: Never Used  Substance and Sexual Activity  . Alcohol use: No    Alcohol/week: 0.0 oz  . Drug use: No  . Sexual activity: Yes  Lifestyle  . Physical activity:    Days per week: Not on file    Minutes per session: Not on file  . Stress: Not on file  Relationships  . Social connections:    Talks on phone: Not on file    Gets  together: Not on file    Attends religious service: Not on file    Active member of club or organization: Not on file    Attends meetings of clubs or organizations: Not on file    Relationship status: Not on file  . Intimate partner violence:    Fear of current or ex partner: Not on file    Emotionally abused: Not on file    Physically abused: Not on file    Forced sexual activity: Not on file  Other Topics Concern  .  Not on file  Social History Narrative   Married   Education: College   Exercise: Yes   Review of Systems 13 point ROS - positive for abdominal pain, frequent urination, seasonal allergies, and food allergies     Objective:   Physical Exam  Constitutional: He is oriented to person, place, and time. He appears well-developed and well-nourished.  HENT:  Head: Normocephalic and atraumatic.  Right Ear: External ear normal.  Left Ear: External ear normal.  Mouth/Throat: Oropharynx is clear and moist.  Eyes: Pupils are equal, round, and reactive to light. Conjunctivae and EOM are normal.  Neck: Normal range of motion. Neck supple. No thyromegaly present.  Cardiovascular: Normal rate, regular rhythm, normal heart sounds and intact distal pulses.  Pulmonary/Chest: Effort normal and breath sounds normal. No respiratory distress. He has no wheezes.  Abdominal: Soft. He exhibits no distension. There is no tenderness. Hernia confirmed negative in the right inguinal area and confirmed negative in the left inguinal area.  Genitourinary:  Genitourinary Comments: GU: somewhat firm on the median area without focal nodule, non tender, possibly slightly enlarged  Musculoskeletal: Normal range of motion. He exhibits no edema or tenderness.  Lymphadenopathy:    He has no cervical adenopathy.  Neurological: He is alert and oriented to person, place, and time. He has normal reflexes.  Skin: Skin is warm and dry.  Psychiatric: He has a normal mood and affect. His behavior is normal.    Vitals reviewed.    Vitals:   12/11/17 1521  BP: 135/72  Pulse: (!) 51  Resp: 17  Temp: 98.2 F (36.8 C)  TempSrc: Oral  SpO2: 98%  Weight: 248 lb (112.5 kg)  Height: 6' (1.829 m)      Assessment & Plan:    Raymond Green is a 52 y.o. male Annual physical exam  - -anticipatory guidance as below in AVS, screening labs above. Health maintenance items as above in HPI discussed/recommended as applicable.   Nocturia - Plan: PSA, Ambulatory referral to Urology Urinary frequency - Plan: POCT urinalysis dipstick, PSA, POCT glucose (manual entry) Abnormal prostate exam - Plan: Ambulatory referral to Urology  -Check PSA, but based on exam and increased nocturia, will refer to urology for further evaluation and likely repeat DRE.  Gout, unspecified cause, unspecified chronicity, unspecified site  -Overall stable, discussed uric acid level as well as recommendations on increased dose of allopurinol if increasing frequency of flares.  Has colchicine as needed  Essential hypertension - Plan: Comprehensive metabolic panel  -Stable, check labs  Screening for hyperlipidemia - Plan: Lipid panel  Screening for prostate cancer - Plan: PSA  -We discussed pros and cons of prostate cancer screening, and after this discussion, he chose to have screening done. PSA obtained, and plan as above.    Need for shingles vaccine - Plan: Zoster Vaccine Adjuvanted [facility]) injection - sent to pharmacy.   Gastroesophageal reflux disease, esophagitis presence not specified - Plan: omeprazole (PRILOSEC) 40 MG capsule  -Okay to continue omeprazole, but follow-up with gastroenterology as discussed and discuss meds at that time.   Meds ordered this encounter  Medications  . Zoster Vaccine Adjuvanted [facility]) injection    Sig: Inject 0.5 mLs into the muscle once for 1 dose. Repeat in 2-6 months.    Dispense:  0.5 mL    Refill:  1  . omeprazole (PRILOSEC) 40 MG capsule    Sig: Take 1 capsule (40 mg  total) by mouth daily.    Dispense:  90  capsule    Refill:  0   Patient Instructions    I will check a prostate test to see if that may be causing the nighttime urination. See other info below. I will also refer you to urology to repeat the prostate test as it may be slightly enlarged.   Follow up with gastroenterology as planned and discuss omeprazole use at that visit.   Return to the clinic or go to the nearest emergency room if any of your symptoms worsen or new symptoms occur.  Thank you for coming in today.   Keeping you healthy  Get these tests  Blood pressure- Have your blood pressure checked once a year by your healthcare provider.  Normal blood pressure is 120/80  Weight- Have your body mass index (BMI) calculated to screen for obesity.  BMI is a measure of body fat based on height and weight. You can also calculate your own BMI at [email or web address].  Cholesterol- Have your cholesterol checked every year.  Diabetes- Have your blood sugar checked regularly if you have high blood pressure, high cholesterol, have a family history of diabetes or if you are overweight.  Screening for Colon Cancer- Colonoscopy starting at age 20.  Screening may begin sooner depending on your family history and other health conditions. Follow up colonoscopy as directed by your Gastroenterologist.  Screening for Prostate Cancer- Both blood work (PSA) and a rectal exam help screen for Prostate Cancer.  Screening begins at age 30 with African-American men and at age 85 with Caucasian men.  Screening may begin sooner depending on your family history.   Take these medicines  Flu shot- Every fall.  Tetanus- Every 10 years.  Zostavax- Once after the age of 52 to prevent Shingles.  Pneumonia shot- Once after the age of 50; if you are younger than 8, ask your healthcare provider if you need a Pneumonia shot.  Take these steps  Don't smoke- If you do smoke, talk to your doctor about  quitting.  For tips on how to quit, go to www.smokefree.gov or call 1-800-QUIT-NOW.  Be physically active- Exercise 5 days a week for at least 30 minutes.  If you are not already physically active start slow and gradually work up to 30 minutes of moderate physical activity.  Examples of moderate activity include walking briskly, mowing the yard, dancing, swimming, bicycling, etc.  Eat a healthy diet- Eat a variety of healthy food such as fruits, vegetables, low fat milk, low fat cheese, yogurt, lean meant, poultry, fish, beans, tofu, etc. For more information go to www.thenutritionsource.org  Drink alcohol in moderation- Limit alcohol intake to less than two drinks a day. Never drink and drive.  Dentist- Brush and floss twice daily; visit your dentist twice a year.  Depression- Your emotional health is as important as your physical health. If you're feeling down, or losing interest in things you would normally enjoy please talk to your healthcare provider.  Eye exam- Visit your eye doctor every year.  Safe sex- If you may be exposed to a sexually transmitted infection, use a condom.  Seat belts- Seat belts can save your life; always wear one.  Smoke/Carbon Monoxide detectors- These detectors need to be installed on the appropriate level of your home.  Replace batteries at least once a year.  Skin cancer- When out in the sun, cover up and use sunscreen 15 SPF or higher.  Violence- If anyone is threatening you, please tell your healthcare provider.  Living Will/ Health care power  of attorney- Speak with your healthcare provider and family. Urinary Frequency, Adult Urinary frequency means urinating more often than usual. People with urinary frequency urinate at least 8 times in 24 hours, even if they drink a normal amount of fluid. Although they urinate more often than normal, the total amount of urine produced in a day may be normal. Urinary frequency is also called pollakiuria. What are the  causes? This condition may be caused by:  A urinary tract infection.  Obesity.  Bladder problems, such as bladder stones.  Caffeine or alcohol.  Eating food or drinking fluids that irritate the bladder. These include coffee, tea, soda, artificial sweeteners, citrus, tomato-based foods, and chocolate.  Certain medicines, such as medicines that help the body get rid of extra fluid (diuretics).  Muscle or nerve weakness.  Overactive bladder.  Chronic diabetes.  Interstitial cystitis.  In men, problems with the prostate, such as an enlarged prostate.  In women, pregnancy.  In some cases, the cause may not be known. What increases the risk? This condition is more likely to develop in:  Women who have gone through menopause.  Men with prostate problems.  People with a disease or injury that affects the nerves or spinal cord.  People who have or have had a condition that affects the brain, such as a stroke.  What are the signs or symptoms? Symptoms of this condition include:  Feeling an urgent need to urinate often. The stress and anxiety of needing to find a bathroom quickly can make this urge worse.  Urinating 8 or more times in 24 hours.  Urinating as often as every 1 to 2 hours.  How is this diagnosed? This condition is diagnosed based on your symptoms, your medical history, and a physical exam. You may have tests, such as:  Blood tests.  Urine tests.  Imaging tests, such as X-rays or ultrasounds.  A bladder test.  A test of your neurological system. This is the body system that senses the need to urinate.  A test to check for problems in the urethra and bladder called cystoscopy.  You may also be asked to keep a bladder diary. A bladder diary is a record of what you eat and drink, how often you urinate, and how much you urinate. You may need to see a health care provider who specializes in conditions of the urinary tract (urologist) or kidneys  (nephrologist). How is this treated? Treatment for this condition depends on the cause. Sometimes the condition goes away on its own and treatment is not necessary. If treatment is needed, it may include:  Taking medicine.  Learning exercises that strengthen the muscles that help control urination.  Following a bladder training program. This may include: ? Learning to delay going to the bathroom. ? Double urinating (voiding). This helps if you are not completely emptying your bladder. ? Scheduled voiding.  Making diet changes, such as: ? Avoiding caffeine. ? Drinking fewer fluids, especially alcohol. ? Not drinking in the evening. ? Not having foods or drinks that may irritate the bladder. ? Eating foods that help prevent or ease constipation. Constipation can make this condition worse.  Having the nerves in your bladder stimulated. There are two options for stimulating the nerves to your bladder: ? Outpatient electrical nerve stimulation. This is done by your health care provider. ? Surgery to implant a bladder pacemaker. The pacemaker helps to control the urge to urinate.  Follow these instructions at home:  Keep a bladder diary if  told to by your health care provider.  Take over-the-counter and prescription medicines only as told by your health care provider.  Do any exercises as told by your health care provider.  Follow a bladder training program as told by your health care provider.  Make any recommended diet changes.  Keep all follow-up visits as told by your health care provider. This is important. Contact a health care provider if:  You start urinating more often.  You feel pain or irritation when you urinate.  You notice blood in your urine.  Your urine looks cloudy.  You develop a fever.  You begin vomiting. Get help right away if:  You are unable to urinate. This information is not intended to replace advice given to you by your health care provider.  Make sure you discuss any questions you have with your health care provider. Document Released: 04/01/2009 Document Revised: 07/07/2015 Document Reviewed: 12/30/2014 Elsevier Interactive Patient Education  2018 [employer].    IF you received an x-ray today, you will receive an invoice from [facility]. Please contact [facility] at [phone] with questions or concerns regarding your invoice.   IF you received labwork today, you will receive an invoice from [facility]. Please contact [facility] at [phone] with questions or concerns regarding your invoice.   Our billing staff will not be able to assist you with questions regarding bills from these companies.  You will be contacted with the lab results as soon as they are available. The fastest way to get your results is to activate your My Chart account. Instructions are located on the last page of this paperwork. If you have not heard from [place] regarding the results in 2 weeks, please contact this office.       I personally performed the services described in this documentation, which was scribed in my presence. The recorded information has been reviewed and considered for accuracy and completeness, addended by me as needed, and agree with information above.  Signed,   [name], MD [facility] at [facility].  12/14/17 12:53 PM

## 2017-12-12 LAB — COMPREHENSIVE METABOLIC PANEL
ALK PHOS: 61 IU/L (ref 39–117)
ALT: 17 IU/L (ref 0–44)
AST: 23 IU/L (ref 0–40)
Albumin/Globulin Ratio: 1.4 (ref 1.2–2.2)
Albumin: 4.3 g/dL (ref 3.5–5.5)
BILIRUBIN TOTAL: 0.5 mg/dL (ref 0.0–1.2)
BUN/Creatinine Ratio: 9 (ref 9–20)
BUN: 13 mg/dL (ref 6–24)
CALCIUM: 9.9 mg/dL (ref 8.7–10.2)
CHLORIDE: 102 mmol/L (ref 96–106)
CO2: 25 mmol/L (ref 20–29)
Creatinine, Ser: 1.43 mg/dL — ABNORMAL HIGH (ref 0.76–1.27)
GFR calc Af Amer: 65 mL/min/{1.73_m2} (ref >59)
GFR, EST NON AFRICAN AMERICAN: 56 mL/min/{1.73_m2} — AB (ref >59)
Globulin, Total: 3 g/dL (ref 1.5–4.5)
Glucose: 80 mg/dL (ref 65–99)
POTASSIUM: 4.2 mmol/L (ref 3.5–5.2)
Sodium: 140 mmol/L (ref 134–144)
Total Protein: 7.3 g/dL (ref 6.0–8.5)

## 2017-12-12 LAB — PSA: Prostate Specific Ag, Serum: 0.3 ng/mL (ref 0.0–4.0)

## 2017-12-12 LAB — LIPID PANEL
CHOLESTEROL TOTAL: 213 mg/dL — AB (ref 100–199)
Chol/HDL Ratio: 4.3 ratio (ref 0.0–5.0)
HDL: 50 mg/dL (ref >39)
LDL Calculated: 137 mg/dL — ABNORMAL HIGH (ref 0–99)
TRIGLYCERIDES: 130 mg/dL (ref 0–149)
VLDL Cholesterol Cal: 26 mg/dL (ref 5–40)

## 2017-12-13 DIAGNOSIS — I10 Essential (primary) hypertension: Secondary | ICD-10-CM | POA: Diagnosis not present

## 2017-12-13 DIAGNOSIS — N183 Chronic kidney disease, stage 3 (moderate): Secondary | ICD-10-CM | POA: Diagnosis not present

## 2017-12-13 DIAGNOSIS — R351 Nocturia: Secondary | ICD-10-CM | POA: Diagnosis not present

## 2017-12-13 DIAGNOSIS — N182 Chronic kidney disease, stage 2 (mild): Secondary | ICD-10-CM | POA: Diagnosis not present

## 2017-12-24 ENCOUNTER — Encounter: Payer: 59 | Admitting: Family Medicine

## 2017-12-24 DIAGNOSIS — H524 Presbyopia: Secondary | ICD-10-CM | POA: Diagnosis not present

## 2017-12-24 DIAGNOSIS — H52222 Regular astigmatism, left eye: Secondary | ICD-10-CM | POA: Diagnosis not present

## 2017-12-24 DIAGNOSIS — H5212 Myopia, left eye: Secondary | ICD-10-CM | POA: Diagnosis not present

## 2017-12-24 MED FILL — BYSTOLIC 20 MG TABLET: 20 | 90 days supply | Qty: 90 | Fill #1

## 2017-12-24 MED FILL — SPIRONOLACTONE 50 MG TABS: 50 | 90 days supply | Qty: 90 | Fill #0

## 2018-01-10 DIAGNOSIS — R351 Nocturia: Secondary | ICD-10-CM | POA: Diagnosis not present

## 2018-01-10 DIAGNOSIS — N401 Enlarged prostate with lower urinary tract symptoms: Secondary | ICD-10-CM | POA: Diagnosis not present

## 2018-01-14 MED FILL — TAMSULOSIN HCL 0.4 MG CAP: 0.4 | 30 days supply | Qty: 30 | Fill #0

## 2018-01-17 DIAGNOSIS — R109 Unspecified abdominal pain: Secondary | ICD-10-CM | POA: Diagnosis not present

## 2018-01-17 DIAGNOSIS — K210 Gastro-esophageal reflux disease with esophagitis: Secondary | ICD-10-CM | POA: Diagnosis not present

## 2018-01-17 MED FILL — CYCLOBENZAPRINE HCL 10 MG T: 10 | 30 days supply | Qty: 30 | Fill #0

## 2018-01-29 DIAGNOSIS — R3915 Urgency of urination: Secondary | ICD-10-CM | POA: Diagnosis not present

## 2018-01-29 DIAGNOSIS — N3941 Urge incontinence: Secondary | ICD-10-CM | POA: Diagnosis not present

## 2018-01-29 DIAGNOSIS — M62838 Other muscle spasm: Secondary | ICD-10-CM | POA: Diagnosis not present

## 2018-01-29 DIAGNOSIS — R1032 Left lower quadrant pain: Secondary | ICD-10-CM | POA: Diagnosis not present

## 2018-01-29 DIAGNOSIS — R351 Nocturia: Secondary | ICD-10-CM | POA: Diagnosis not present

## 2018-01-29 DIAGNOSIS — M6281 Muscle weakness (generalized): Secondary | ICD-10-CM | POA: Diagnosis not present

## 2018-02-19 DIAGNOSIS — N3941 Urge incontinence: Secondary | ICD-10-CM | POA: Diagnosis not present

## 2018-02-19 DIAGNOSIS — M62838 Other muscle spasm: Secondary | ICD-10-CM | POA: Diagnosis not present

## 2018-02-19 DIAGNOSIS — R3915 Urgency of urination: Secondary | ICD-10-CM | POA: Diagnosis not present

## 2018-02-19 DIAGNOSIS — R351 Nocturia: Secondary | ICD-10-CM | POA: Diagnosis not present

## 2018-02-19 DIAGNOSIS — M6281 Muscle weakness (generalized): Secondary | ICD-10-CM | POA: Diagnosis not present

## 2018-02-19 DIAGNOSIS — R1032 Left lower quadrant pain: Secondary | ICD-10-CM | POA: Diagnosis not present

## 2018-02-22 MED FILL — SHINGRIX 50 MCG SUS: 50 | 1 days supply | Qty: 1 | Fill #1

## 2018-04-01 DIAGNOSIS — R351 Nocturia: Secondary | ICD-10-CM | POA: Diagnosis not present

## 2018-04-01 DIAGNOSIS — K5900 Constipation, unspecified: Secondary | ICD-10-CM | POA: Diagnosis not present

## 2018-04-01 DIAGNOSIS — M62838 Other muscle spasm: Secondary | ICD-10-CM | POA: Diagnosis not present

## 2018-04-01 DIAGNOSIS — M6281 Muscle weakness (generalized): Secondary | ICD-10-CM | POA: Diagnosis not present

## 2018-04-01 DIAGNOSIS — N3941 Urge incontinence: Secondary | ICD-10-CM | POA: Diagnosis not present

## 2018-04-01 DIAGNOSIS — R1032 Left lower quadrant pain: Secondary | ICD-10-CM | POA: Diagnosis not present

## 2018-05-02 ENCOUNTER — Other Ambulatory Visit: Payer: Self-pay | Admitting: Physician Assistant

## 2018-05-02 MED FILL — BYSTOLIC 20 MG TABLET: 20 | 90 days supply | Qty: 90 | Fill #0

## 2018-05-03 ENCOUNTER — Telehealth: Payer: Self-pay | Admitting: Family Medicine

## 2018-05-03 NOTE — Telephone Encounter (Signed)
Already refilled on 05/02/18.

## 2018-05-03 NOTE — Telephone Encounter (Signed)
Copied from CRM [id number]. Topic: Quick Communication - Rx Refill/Question >> May 03, 2018  2:29 PM Lynne LoganHudson, Caryn D wrote: Medication: BYSTOLIC 20 MG TABS   Has the patient contacted their pharmacy? No. (Agent: If no, request that the patient contact the pharmacy for the refill.) (Agent: If yes, when and what did the pharmacy advise?)  Preferred Pharmacy (with phone number or street name): [facility] [place] [address] [phone] (Phone) [phone] (Fax)    Agent: Please be advised that RX refills may take up to 3 business days. We ask that you follow-up with your pharmacy.

## 2018-05-08 DIAGNOSIS — N411 Chronic prostatitis: Secondary | ICD-10-CM | POA: Diagnosis not present

## 2018-05-08 DIAGNOSIS — R351 Nocturia: Secondary | ICD-10-CM | POA: Diagnosis not present

## 2018-05-08 DIAGNOSIS — N401 Enlarged prostate with lower urinary tract symptoms: Secondary | ICD-10-CM | POA: Diagnosis not present

## 2018-05-08 MED FILL — DOXYCYCLINE HYCLATE 100 MG: 100 | 30 days supply | Qty: 60 | Fill #0

## 2018-05-08 MED FILL — MELOXICAM 15 MG TABLET: 15 | 14 days supply | Qty: 14 | Fill #0

## 2018-05-13 ENCOUNTER — Telehealth: Payer: Self-pay | Admitting: Internal Medicine

## 2018-05-13 ENCOUNTER — Encounter: Payer: Self-pay | Admitting: Internal Medicine

## 2018-05-13 DIAGNOSIS — D509 Iron deficiency anemia, unspecified: Secondary | ICD-10-CM

## 2018-05-13 NOTE — Telephone Encounter (Signed)
Left message on machine to call back  

## 2018-05-13 NOTE — Telephone Encounter (Signed)
Raymond Green, Thanks. We'll contact him about EGD.   [name], Can you contact him about EGD, [facility]. Also cbc in next couple days.  Thanks

## 2018-05-13 NOTE — Telephone Encounter (Signed)
Having LUQ vs mid pain x years - mainly at night so ? Radicular pain Has taken omeprazole via PCP no help This past weekend intensified LUQ pain and nausea and vomiting and small amount of blood mixed in - he has photo that I saw  Needs an EGD I think  Some mild constipation chronic  EGD 2016 - [facility] LUQ pain - tiny gastric ulcer, erosions neg bxs Colon 2016 Madilyn FiremanHayes - benign mucosal polyp and internal hemorrhoids Colonoscopy 2013 Christella HartiganJacobs - hemorrhoid o/wnegative 01/2008 Marina GoodellPerry negative x hemorrhoids 01/2008 EGD - asx 16 mm ring/stx  Will forward to Dr. Christella HartiganJacobs to review since he is primary GI MD  Seems ok for a direct EGD to me and patient agreeable but I explained that I needed route through Dr. Christella HartiganJacobs

## 2018-05-14 NOTE — Telephone Encounter (Signed)
[phone] 

## 2018-05-14 NOTE — Telephone Encounter (Signed)
The pt has been scheduled for EGD and previsit.  He will also come in for labs The pt has been advised of the information and verbalized understanding.

## 2018-05-28 DIAGNOSIS — N183 Chronic kidney disease, stage 3 (moderate): Secondary | ICD-10-CM | POA: Diagnosis not present

## 2018-05-28 DIAGNOSIS — I10 Essential (primary) hypertension: Secondary | ICD-10-CM | POA: Diagnosis not present

## 2018-05-28 DIAGNOSIS — K279 Peptic ulcer, site unspecified, unspecified as acute or chronic, without hemorrhage or perforation: Secondary | ICD-10-CM | POA: Diagnosis not present

## 2018-06-03 ENCOUNTER — Other Ambulatory Visit (INDEPENDENT_AMBULATORY_CARE_PROVIDER_SITE_OTHER): Payer: 59

## 2018-06-03 DIAGNOSIS — N183 Chronic kidney disease, stage 3 (moderate): Secondary | ICD-10-CM | POA: Diagnosis not present

## 2018-06-03 DIAGNOSIS — D509 Iron deficiency anemia, unspecified: Secondary | ICD-10-CM

## 2018-06-03 LAB — CBC WITH DIFFERENTIAL/PLATELET
Basophils Absolute: 0.1 10*3/uL (ref 0.0–0.1)
Basophils Relative: 1.3 % (ref 0.0–3.0)
EOS ABS: 0.3 10*3/uL (ref 0.0–0.7)
EOS PCT: 5.2 % — AB (ref 0.0–5.0)
HEMATOCRIT: 39.8 % (ref 39.0–52.0)
HEMOGLOBIN: 13.5 g/dL (ref 13.0–17.0)
LYMPHS PCT: 38.3 % (ref 12.0–46.0)
Lymphs Abs: 2.1 10*3/uL (ref 0.7–4.0)
MCHC: 33.9 g/dL (ref 30.0–36.0)
MCV: 88.9 fl (ref 78.0–100.0)
Monocytes Absolute: 0.5 10*3/uL (ref 0.1–1.0)
Monocytes Relative: 8.8 % (ref 3.0–12.0)
Neutro Abs: 2.6 10*3/uL (ref 1.4–7.7)
Neutrophils Relative %: 46.4 % (ref 43.0–77.0)
Platelets: 167 10*3/uL (ref 150.0–400.0)
RBC: 4.48 Mil/uL (ref 4.22–5.81)
RDW: 13.7 % (ref 11.5–15.5)
WBC: 5.5 10*3/uL (ref 4.0–10.5)

## 2018-06-10 ENCOUNTER — Ambulatory Visit (AMBULATORY_SURGERY_CENTER): Payer: Self-pay | Admitting: *Deleted

## 2018-06-10 ENCOUNTER — Other Ambulatory Visit: Payer: Self-pay

## 2018-06-10 VITALS — Ht 72.0 in | Wt 247.2 lb

## 2018-06-10 DIAGNOSIS — R1012 Left upper quadrant pain: Secondary | ICD-10-CM

## 2018-06-10 NOTE — Progress Notes (Signed)
No egg or soy allergy known to patient  No issues with past sedation with any surgeries  or procedures, no intubation problems  No diet pills per patient No home 02 use per patient  No blood thinners per patient  Pt haS SOME issues with constipation  No A fib or A flutter  EMMI  OFFERED AND DECLINED

## 2018-06-11 ENCOUNTER — Encounter: Payer: Self-pay | Admitting: Gastroenterology

## 2018-06-14 ENCOUNTER — Encounter: Payer: 59 | Admitting: Gastroenterology

## 2018-06-25 ENCOUNTER — Encounter: Payer: Self-pay | Admitting: Gastroenterology

## 2018-06-25 ENCOUNTER — Ambulatory Visit: Payer: 59 | Admitting: Gastroenterology

## 2018-06-25 VITALS — BP 163/92 | HR 49 | Temp 97.3°F | Resp 12 | Ht 72.0 in | Wt 247.0 lb

## 2018-06-25 DIAGNOSIS — K259 Gastric ulcer, unspecified as acute or chronic, without hemorrhage or perforation: Secondary | ICD-10-CM

## 2018-06-25 DIAGNOSIS — R1012 Left upper quadrant pain: Secondary | ICD-10-CM

## 2018-06-25 DIAGNOSIS — K2970 Gastritis, unspecified, without bleeding: Secondary | ICD-10-CM

## 2018-06-25 DIAGNOSIS — K2950 Unspecified chronic gastritis without bleeding: Secondary | ICD-10-CM | POA: Diagnosis not present

## 2018-06-25 MED ORDER — SODIUM CHLORIDE 0.9 % IV SOLN: 500.0000 mL | Freq: Once | INTRAVENOUS | Status: DC

## 2018-06-25 NOTE — Op Note (Signed)
[facility] Patient Name: Raymond Green Procedure Date: 06/25/2018 9:30 AM MRN: [id number] Endoscopist: [name] , MD Age: 53 Referring MD:  Date of Birth: [birth year] Gender: Male Account #: [id number] Procedure:                Upper GI endoscopy Indications:              Abdominal pain in the left upper quadrant; chronic                            intermittent LUQ pain, vomited small amount of                            blood several weeks ago Medicines:                Monitored Anesthesia Care Procedure:                Pre-Anesthesia Assessment:                           - Prior to the procedure, a History and Physical                            was performed, and patient medications and                            allergies were reviewed. The patient's tolerance of                            previous anesthesia was also reviewed. The risks                            and benefits of the procedure and the sedation                            options and risks were discussed with the patient.                            All questions were answered, and informed consent                            was obtained. Prior Anticoagulants: The patient has                            taken no previous anticoagulant or antiplatelet                            agents. ASA Grade Assessment: II - A patient with                            mild systemic disease. After reviewing the risks                            and benefits, the patient was deemed in  satisfactory condition to undergo the procedure.                           After obtaining informed consent, the endoscope was                            passed under direct vision. Throughout the                            procedure, the patient's blood pressure, pulse, and                            oxygen saturations were monitored continuously. The                            Endoscope was introduced through  the mouth, and                            advanced to the second part of duodenum. The upper                            GI endoscopy was accomplished without difficulty.                            The patient tolerated the procedure well. Scope In: Scope Out: Findings:                 Several small erosions noted throughout the                            stomach, also mild pangastritis (erythema,                            friability). Biopsies were taken with a cold                            forceps for histology.                           The exam was otherwise without abnormality. Complications:            No immediate complications. Estimated blood loss:                            None. Estimated Blood Loss:     Estimated blood loss: none. Impression:               - Gastritis and several gastric erosions, biopsied                            to check for H. pylori. Recommendation:           - Patient has a contact number available for                            emergencies. The signs and symptoms of potential  delayed complications were discussed with the                            patient. Return to normal activities tomorrow.                            Written discharge instructions were provided to the                            patient.                           - Resume previous diet.                           - Continue present medications.                           - Await pathology results. If H. pylori + will                            start appropriate antibiotics. IF H. pylori                            negative, will start empiric once daily PPI and                            follow him clinically. [name], MD 06/25/2018 9:47:04 AM This report has been signed electronically.

## 2018-06-25 NOTE — Progress Notes (Signed)
To PACU, VSS. Report to [email or web address] 

## 2018-06-25 NOTE — Progress Notes (Signed)
Called to room to assist during endoscopic procedure.  Patient ID and intended procedure confirmed with present staff. Received instructions for my participation in the procedure from the performing physician.  

## 2018-06-25 NOTE — Patient Instructions (Signed)
YOU HAD AN ENDOSCOPIC PROCEDURE TODAY AT THE [facility]:   Refer to the procedure report that was given to you for any specific questions about what was found during the examination.  If the procedure report does not answer your questions, please call your gastroenterologist to clarify.  If you requested that your care partner not be given the details of your procedure findings, then the procedure report has been included in a sealed envelope for you to review at your convenience later.  YOU SHOULD EXPECT: Some feelings of bloating in the abdomen. Passage of more gas than usual.  Walking can help get rid of the air that was put into your GI tract during the procedure and reduce the bloating. If you had a lower endoscopy (such as a colonoscopy or flexible sigmoidoscopy) you may notice spotting of blood in your stool or on the toilet paper. If you underwent a bowel prep for your procedure, you may not have a normal bowel movement for a few days.  Please Note:  You might notice some irritation and congestion in your nose or some drainage.  This is from the oxygen used during your procedure.  There is no need for concern and it should clear up in a day or so.  SYMPTOMS TO REPORT IMMEDIATELY:   Following upper endoscopy (EGD)  Vomiting of blood or coffee ground material  New chest pain or pain under the shoulder blades  Painful or persistently difficult swallowing  New shortness of breath  Fever of 100F or higher  Black, tarry-looking stools  For urgent or emergent issues, a gastroenterologist can be reached at any hour by calling [phone].   DIET:  We do recommend a small meal at first, but then you may proceed to your regular diet.  Drink plenty of fluids but you should avoid alcoholic beverages for 24 hours.  ACTIVITY:  You should plan to take it easy for the rest of today and you should NOT DRIVE or use heavy machinery until tomorrow (because of the sedation medicines used  during the test).    FOLLOW UP: Our staff will call the number listed on your records the next business day following your procedure to check on you and address any questions or concerns that you may have regarding the information given to you following your procedure. If we do not reach you, we will leave a message.  However, if you are feeling well and you are not experiencing any problems, there is no need to return our call.  We will assume that you have returned to your regular daily activities without incident.  If any biopsies were taken you will be contacted by phone or by letter within the next 1-3 weeks.  Please call us at [phone] if you have not heard about the biopsies in 3 weeks.   Await for biopsy results Gastritis (handout given)  SIGNATURES/CONFIDENTIALITY: You and/or your care partner have signed paperwork which will be entered into your electronic medical record.  These signatures attest to the fact that that the information above on your After Visit Summary has been reviewed and is understood.  Full responsibility of the confidentiality of this discharge information lies with you and/or your care-partner. 

## 2018-06-25 NOTE — Progress Notes (Signed)
Pt's states no medical or surgical changes since previsit or office visit. 

## 2018-06-26 ENCOUNTER — Telehealth: Payer: Self-pay

## 2018-06-26 NOTE — Telephone Encounter (Signed)
  Follow up Call-  Call back number 06/25/2018  Post procedure Call Back phone  # [phone]  Permission to leave phone message Yes  Some recent data might be hidden     Patient questions:  Do you have a fever, pain , or abdominal swelling? No. Pain Score  0 *  Have you tolerated food without any problems? Yes.    Have you been able to return to your normal activities? Yes.    Do you have any questions about your discharge instructions: Diet   No. Medications  No. Follow up visit  No.  Do you have questions or concerns about your Care? No.  Actions: * If pain score is 4 or above: No action needed, pain <4.

## 2018-06-26 NOTE — Telephone Encounter (Signed)
Follow up call made, name identifier, left a voicemail. 

## 2018-06-27 ENCOUNTER — Telehealth: Payer: Self-pay | Admitting: Gastroenterology

## 2018-06-27 NOTE — Telephone Encounter (Signed)
The pt has been advised that results are not available as of today and we will contact him as soon as resulted

## 2018-06-27 NOTE — Telephone Encounter (Signed)
Pt called in wanting to know about results from his procedure.

## 2018-07-01 ENCOUNTER — Other Ambulatory Visit: Payer: Self-pay

## 2018-07-01 DIAGNOSIS — K219 Gastro-esophageal reflux disease without esophagitis: Secondary | ICD-10-CM

## 2018-07-01 MED ORDER — OMEPRAZOLE 40 MG PO CPDR: 40.0000 mg | DELAYED_RELEASE_CAPSULE | Freq: Every day | ORAL | 5 refills | Status: DC

## 2018-07-01 MED FILL — OMEPRAZOLE 40 MG CPDR: 40 | 30 days supply | Qty: 30 | Fill #0

## 2018-07-16 ENCOUNTER — Other Ambulatory Visit: Payer: Self-pay | Admitting: Physician Assistant

## 2018-07-16 DIAGNOSIS — I10 Essential (primary) hypertension: Secondary | ICD-10-CM

## 2018-07-16 NOTE — Telephone Encounter (Addendum)
Left message for pt to return call to office to schedule f/u appt in order to continue to receive refills on requested medication. Also needed to confirm that pt was still using Losartan 100 mg tablet. Last refill on 11/07/16#30 with 5 refills.

## 2018-07-23 ENCOUNTER — Other Ambulatory Visit: Payer: Self-pay

## 2018-07-23 ENCOUNTER — Encounter: Payer: Self-pay | Admitting: Family Medicine

## 2018-07-23 ENCOUNTER — Ambulatory Visit (INDEPENDENT_AMBULATORY_CARE_PROVIDER_SITE_OTHER): Payer: 59 | Admitting: Family Medicine

## 2018-07-23 VITALS — BP 157/88 | HR 46 | Temp 97.7°F | Resp 20 | Ht 72.0 in | Wt 246.4 lb

## 2018-07-23 DIAGNOSIS — R0789 Other chest pain: Secondary | ICD-10-CM

## 2018-07-23 DIAGNOSIS — E7800 Pure hypercholesterolemia, unspecified: Secondary | ICD-10-CM

## 2018-07-23 DIAGNOSIS — I10 Essential (primary) hypertension: Secondary | ICD-10-CM | POA: Diagnosis not present

## 2018-07-23 DIAGNOSIS — R001 Bradycardia, unspecified: Secondary | ICD-10-CM | POA: Diagnosis not present

## 2018-07-23 MED ORDER — ATORVASTATIN CALCIUM 40 MG PO TABS: 40.0000 mg | ORAL_TABLET | Freq: Every day | ORAL | 3 refills | Status: DC

## 2018-07-23 MED ORDER — LOSARTAN POTASSIUM 100 MG PO TABS: 100.0000 mg | ORAL_TABLET | Freq: Every day | ORAL | 1 refills | Status: DC

## 2018-07-23 MED ORDER — AMLODIPINE BESYLATE 5 MG PO TABS: 5.0000 mg | ORAL_TABLET | Freq: Every day | ORAL | 0 refills | Status: DC

## 2018-07-23 MED ORDER — NEBIVOLOL HCL 20 MG PO TABS: 1.0000 | ORAL_TABLET | Freq: Every day | ORAL | 0 refills | Status: DC

## 2018-07-23 MED FILL — LOSARTAN POTASSIUM 100 MG T: 100 | 30 days supply | Qty: 30 | Fill #0

## 2018-07-23 MED FILL — ATORVASTATIN 40 MG TABLET: 40 | 90 days supply | Qty: 90 | Fill #0

## 2018-07-23 MED FILL — AMLODIPINE BESYLATE 5 MG TA: 5 | 90 days supply | Qty: 90 | Fill #0

## 2018-07-23 MED FILL — BYSTOLIC 20 MG TABLET: 20 | 90 days supply | Qty: 90 | Fill #0

## 2018-07-23 NOTE — Patient Instructions (Signed)
° ° ° °  If you have lab work done today you will be contacted with your lab results within the next 2 weeks.  If you have not heard from us then please contact us. The fastest way to get your results is to register for My Chart. ° ° °IF you received an x-ray today, you will receive an invoice from [facility]. Please contact [facility] at [phone] with questions or concerns regarding your invoice.  ° °IF you received labwork today, you will receive an invoice from [facility]. Please contact [facility] at [phone] with questions or concerns regarding your invoice.  ° °Our billing staff will not be able to assist you with questions regarding bills from these companies. ° °You will be contacted with the lab results as soon as they are available. The fastest way to get your results is to activate your My Chart account. Instructions are located on the last page of this paperwork. If you have not heard from us regarding the results in 2 weeks, please contact this office. °  ° ° ° °

## 2018-07-23 NOTE — Progress Notes (Signed)
2/4/20204:41 PM  Raymond Green 1966/03/10, 53 y.o. male [id number]  Chief Complaint  Patient presents with  . Hypertension    f/u  . Medication Refill    Losartan Potassium    HPI:   Patient is a 53 y.o. male with past medical history significant for HTN with LVH, gout, HLP, GERD who presents today for routine followup  Last OV June 2019 - CPE with Dr Neva Seat Last appt with card, Dr Arbie Cookey, 2018 Still seeing renal and urologist  Found gastric erosions on recent EGD, started on PPI, sees GI next month  Yesterday while working, walking felt mid chest pain, congested,stabbing pain,  lasted about 30 seconds, self resolved, he did not stop his activity. He had mild SOB, no diaphoresis, no nausea, radiated into his upper chest/shoulders. Denies any palpitations This has never happened before He is not having CP today Does not exercise Works 2 full jobs  Checks BP at home every day Similar to today  Normal exercise ABI in 2018  Lab Results  Component Value Date   CREATININE 1.43 (H) 12/11/2017  GFR 65  Lab Results  Component Value Date   CHOL 213 (H) 12/11/2017   HDL 50 12/11/2017   LDLCALC 137 (H) 12/11/2017   TRIG 130 12/11/2017   CHOLHDL 4.3 12/11/2017   ASCD risk is 10%  Fall Risk  07/23/2018 12/11/2017 11/22/2017 10/22/2017 07/27/2017  Falls in the past year? 0 No No No No  Number falls in past yr: 0 - - - -  Injury with Fall? 1 - - - -  Follow up Falls evaluation completed - - - -     Depression screen [facility] 2/9 07/23/2018 12/11/2017 11/22/2017  Decreased Interest 0 0 0  Down, Depressed, Hopeless 0 0 0  PHQ - 2 Score 0 0 0    Allergies  Allergen Reactions  . Lisinopril     angioedema  . Shrimp [Shellfish Allergy] Nausea And Vomiting    Prior to Admission medications   Medication Sig Start Date End Date Taking? Authorizing Provider  allopurinol (ZYLOPRIM) 100 MG tablet Take 1 tablet (100 mg total) by mouth daily. 01/02/17  Yes Wiseman, [place] D, PA-C    BYSTOLIC 20 MG TABS TAKE 1 TABLET BY MOUTH DAILY. 05/02/18  Yes [name], [place] D, PA-C  docusate sodium (COLACE) 100 MG capsule Take 100 mg by mouth daily.   Yes [provider]  losartan (COZAAR) 100 MG tablet TAKE 1 TABLET BY MOUTH ONCE DAILY 11/07/16  Yes Benjiman Core D, PA-C  omeprazole (PRILOSEC) 40 MG capsule Take 1 capsule (40 mg total) by mouth daily for 30 days. 07/01/18 07/31/18 Yes [name], MD  colchicine 0.6 MG tablet Take 2 initially, then one tonight, then take one tablet daily until gout improved Patient not taking: Reported on 06/10/2018 10/22/17   Shade Flood, MD    Past Medical History:  Diagnosis Date  . Allergy   . Anemia   . Erosive gastritis 2016  . GERD (gastroesophageal reflux disease)   . Headache(784.0)   . Hemorrhoids   . Hyperlipidemia   . Hypertension   . Kidney insufficiency   . LVH (left ventricular hypertrophy)    Dr. Donnie Aho ( cardiology)  . Obesity   . OSA (obstructive sleep apnea)   . Positive PPD, treated 2000   INH  . Sleep apnea    uses cpap  . Vitamin D deficiency     Past Surgical History:  Procedure Laterality Date  .  COLONOSCOPY  12/28/2011   Procedure: COLONOSCOPY;  Surgeon: [name], MD;  Location: [facility];  Service: Endoscopy;  Laterality: N/A;  . HEMORRHOID SURGERY    . UPPER GASTROINTESTINAL ENDOSCOPY      Social History   Tobacco Use  . Smoking status: Never Smoker  . Smokeless tobacco: Never Used  Substance Use Topics  . Alcohol use: No    Alcohol/week: 0.0 standard drinks    Family History  Problem Relation Age of Onset  . Diabetes Mother   . Heart disease Mother   . Hypertension Mother   . Hypertension Father   . Diabetes Sister   . Colon cancer Neg Hx   . Colon polyps Neg Hx   . Esophageal cancer Neg Hx   . Rectal cancer Neg Hx   . Stomach cancer Neg Hx     ROS Per hpi occ dizziness and fatigue  OBJECTIVE:  Blood pressure (!) 168/85, temperature 97.7 F (36.5  C), temperature source Oral, resp. rate 20, height 6' (1.829 m), weight 246 lb 6.4 oz (111.8 kg), SpO2 99 %. Body mass index is 33.42 kg/m.   BP Readings from Last 3 Encounters:  07/23/18 (!) 157/88  06/25/18 (!) 163/92  12/11/17 135/72   Pulse Readings from Last 3 Encounters:  07/23/18 (!) 46  06/25/18 (!) 49  12/11/17 (!) 51    Physical Exam Vitals signs and nursing note reviewed.  Constitutional:      Appearance: He is well-developed.  HENT:     Head: Normocephalic and atraumatic.  Eyes:     Conjunctiva/sclera: Conjunctivae normal.     Pupils: Pupils are equal, round, and reactive to light.  Neck:     Musculoskeletal: Neck supple.  Cardiovascular:     Rate and Rhythm: Regular rhythm. Bradycardia present.     Heart sounds: Normal heart sounds. No murmur. No friction rub. No gallop.   Pulmonary:     Effort: Pulmonary effort is normal.     Breath sounds: Normal breath sounds. No wheezing or rales.  Abdominal:     General: Bowel sounds are normal. There is no distension.     Palpations: Abdomen is soft. There is no hepatomegaly or splenomegaly.     Tenderness: There is no abdominal tenderness.  Skin:    General: Skin is warm and dry.  Neurological:     Mental Status: He is alert and oriented to person, place, and time.    My interpretation of EKG:  Sinus,, HR 42, LVH  ASSESSMENT and PLAN  1. Essential hypertension Uncontrolled. Adding amlodipine and statin, reviewed r/se/b. Cont home BP monitoring. Might need to decrease bystolic given HR. Referring to cards for chest pain event given RF. RTC/ER precautions given - EKG 12-Lead - losartan (COZAAR) 100 MG tablet; Take 1 tablet (100 mg total) by mouth daily. - Ambulatory referral to Cardiology - Comprehensive metabolic panel  2. Other chest pain - Ambulatory referral to Cardiology  3. Pure hypercholesterolemia  4. Bradycardia  Other orders - Nebivolol HCl (BYSTOLIC) 20 MG TABS; Take 1 tablet (20 mg total) by  mouth daily. - amLODipine (NORVASC) 5 MG tablet; Take 1 tablet (5 mg total) by mouth daily. - atorvastatin (LIPITOR) 40 MG tablet; Take 1 tablet (40 mg total) by mouth daily.  Return in about 4 weeks (around 08/20/2018).    Myles Lipps, MD Primary Care at [facility] [address] [place], [place] [address] Ph.  [phone] Fax [phone]

## 2018-07-24 LAB — COMPREHENSIVE METABOLIC PANEL
ALT: 18 IU/L (ref 0–44)
AST: 21 IU/L (ref 0–40)
Albumin/Globulin Ratio: 1.3 (ref 1.2–2.2)
Albumin: 4.1 g/dL (ref 3.8–4.9)
Alkaline Phosphatase: 62 IU/L (ref 39–117)
BUN/Creatinine Ratio: 11 (ref 9–20)
BUN: 15 mg/dL (ref 6–24)
Bilirubin Total: 0.5 mg/dL (ref 0.0–1.2)
CO2: 23 mmol/L (ref 20–29)
Calcium: 9.8 mg/dL (ref 8.7–10.2)
Chloride: 102 mmol/L (ref 96–106)
Creatinine, Ser: 1.31 mg/dL — ABNORMAL HIGH (ref 0.76–1.27)
GFR calc Af Amer: 72 mL/min/{1.73_m2} (ref >59)
GFR calc non Af Amer: 62 mL/min/{1.73_m2} (ref >59)
Globulin, Total: 3.2 g/dL (ref 1.5–4.5)
Glucose: 84 mg/dL (ref 65–99)
Potassium: 4.2 mmol/L (ref 3.5–5.2)
Sodium: 141 mmol/L (ref 134–144)
Total Protein: 7.3 g/dL (ref 6.0–8.5)

## 2018-07-31 DIAGNOSIS — N411 Chronic prostatitis: Secondary | ICD-10-CM | POA: Diagnosis not present

## 2018-07-31 DIAGNOSIS — R3914 Feeling of incomplete bladder emptying: Secondary | ICD-10-CM | POA: Diagnosis not present

## 2018-07-31 DIAGNOSIS — R351 Nocturia: Secondary | ICD-10-CM | POA: Diagnosis not present

## 2018-07-31 DIAGNOSIS — N401 Enlarged prostate with lower urinary tract symptoms: Secondary | ICD-10-CM | POA: Diagnosis not present

## 2018-07-31 MED FILL — SULFAMETHOXAZOLE-TMP DS TAB: 800-160 | 30 days supply | Qty: 60 | Fill #0

## 2018-08-27 ENCOUNTER — Other Ambulatory Visit: Payer: Self-pay

## 2018-08-27 DIAGNOSIS — K219 Gastro-esophageal reflux disease without esophagitis: Secondary | ICD-10-CM

## 2018-08-27 MED ORDER — OMEPRAZOLE 40 MG PO CPDR: 40.0000 mg | DELAYED_RELEASE_CAPSULE | Freq: Every day | ORAL | 0 refills | Status: DC

## 2018-08-27 MED FILL — OMEPRAZOLE 40 MG CPDR: 40 | 30 days supply | Qty: 30 | Fill #0

## 2018-08-30 MED FILL — LOSARTAN POTASSIUM 100 MG T: 100 | 30 days supply | Qty: 30 | Fill #1

## 2018-09-12 DIAGNOSIS — N401 Enlarged prostate with lower urinary tract symptoms: Secondary | ICD-10-CM | POA: Diagnosis not present

## 2018-09-12 DIAGNOSIS — R3915 Urgency of urination: Secondary | ICD-10-CM | POA: Diagnosis not present

## 2018-09-12 DIAGNOSIS — N3943 Post-void dribbling: Secondary | ICD-10-CM | POA: Diagnosis not present

## 2018-09-12 DIAGNOSIS — R3912 Poor urinary stream: Secondary | ICD-10-CM | POA: Diagnosis not present

## 2018-09-12 DIAGNOSIS — N411 Chronic prostatitis: Secondary | ICD-10-CM | POA: Diagnosis not present

## 2018-09-12 MED FILL — TAMSULOSIN HCL 0.4 MG CAP: 0.4 | 30 days supply | Qty: 30 | Fill #0

## 2018-10-24 IMAGING — US US RENAL ARTERY STENOSIS
1 series · 13 of 25 positions shown · non-contrast
Comparison: 10/19/2014 CT

CLINICAL DATA: Mild chronic kidney disease, hypertension

EXAM:
RENAL/URINARY TRACT ULTRASOUND
RENAL DUPLEX DOPPLER ULTRASOUND

[Series 1: us renal artery stenosis · 0.23mm/px · 13 of 71 slices shown]
[im 1/71]
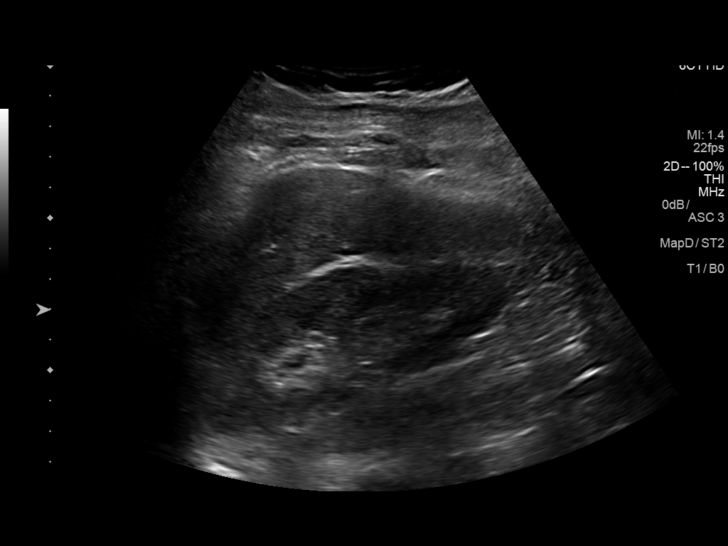
[im 6/71]
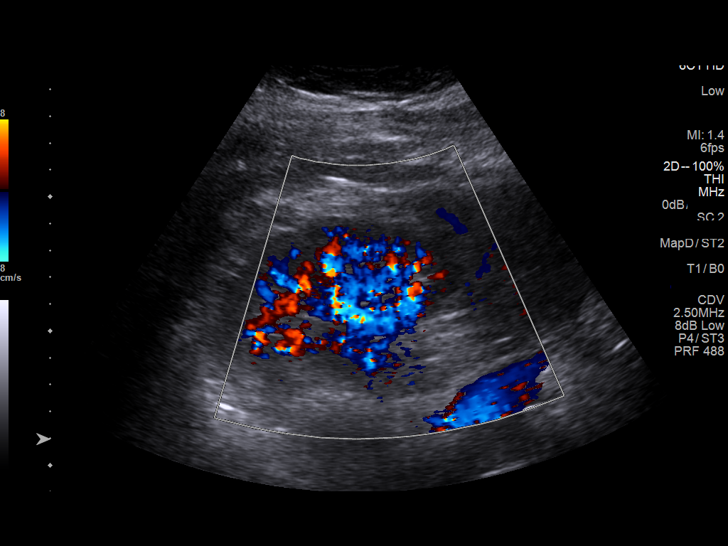
[im 12/71]
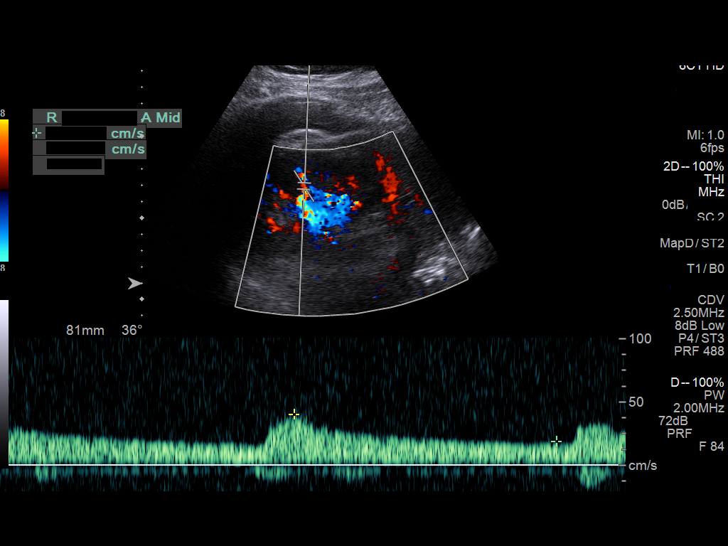
[im 18/71]
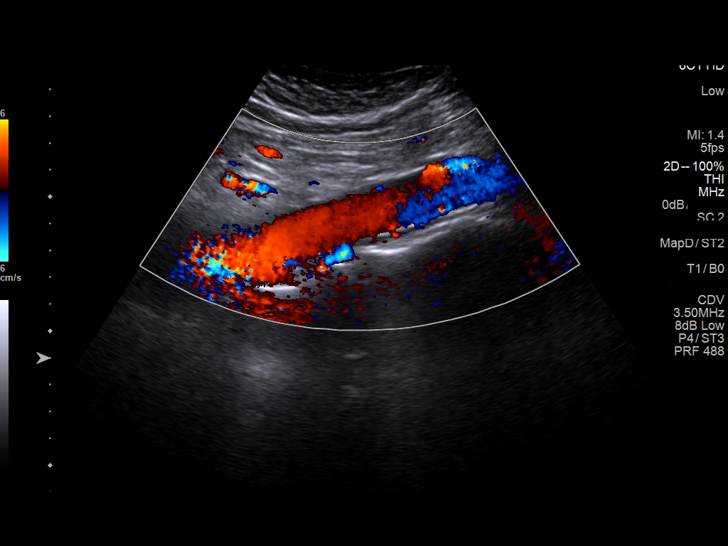
[im 24/71]
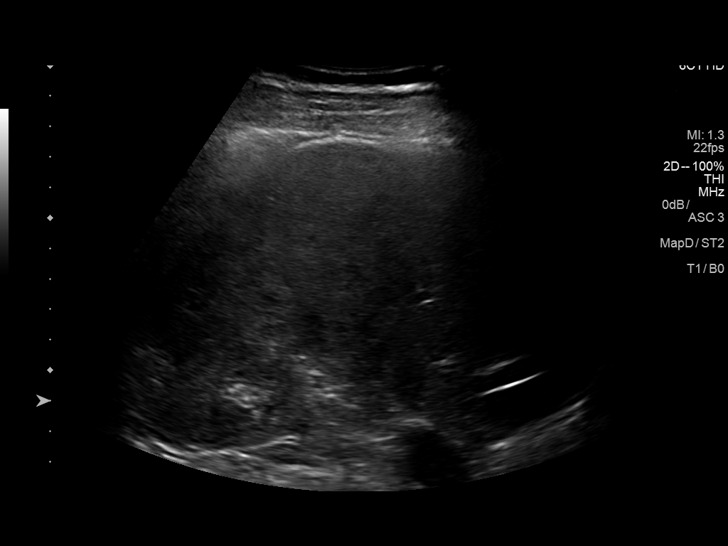
[im 30/71]
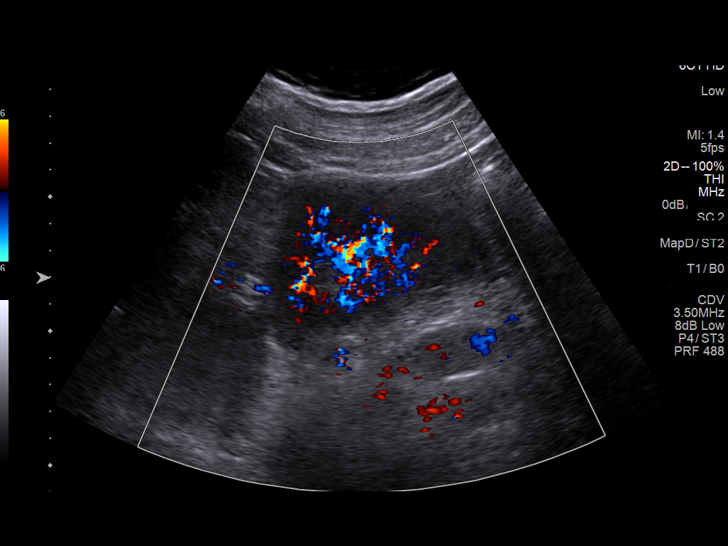
[im 36/71]
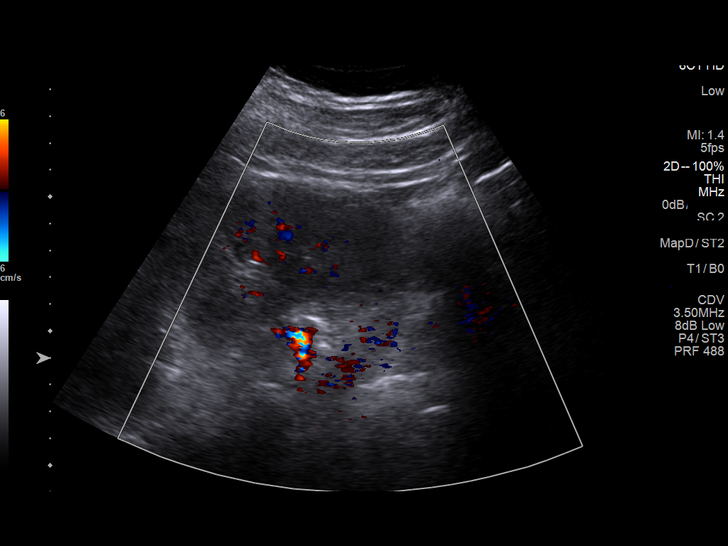
[im 41/71]
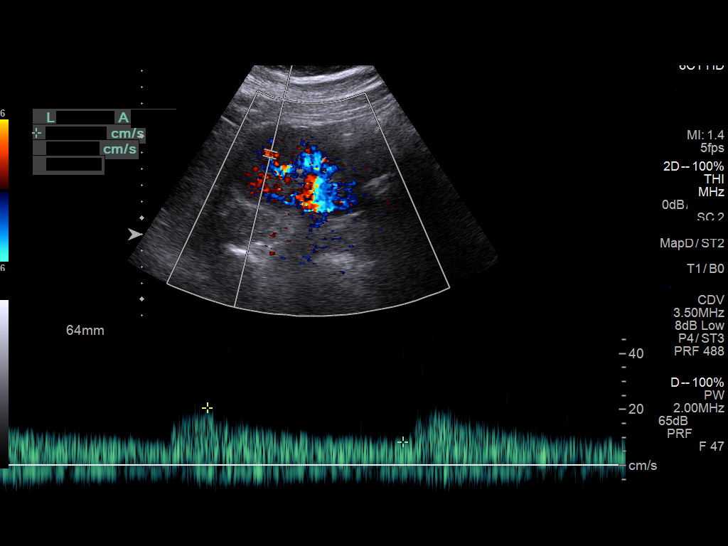
[im 47/71]
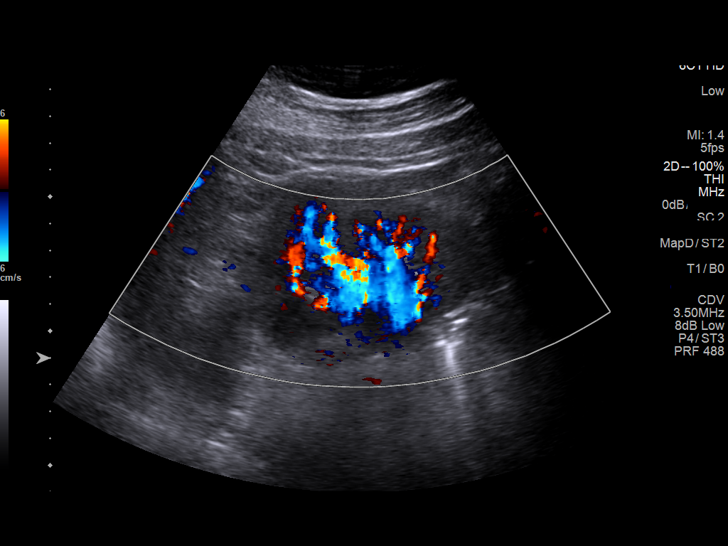
[im 53/71]
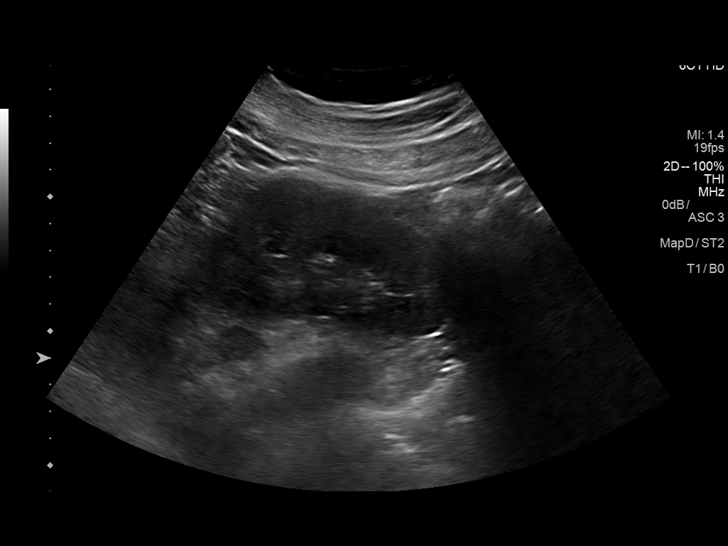
[im 59/71]
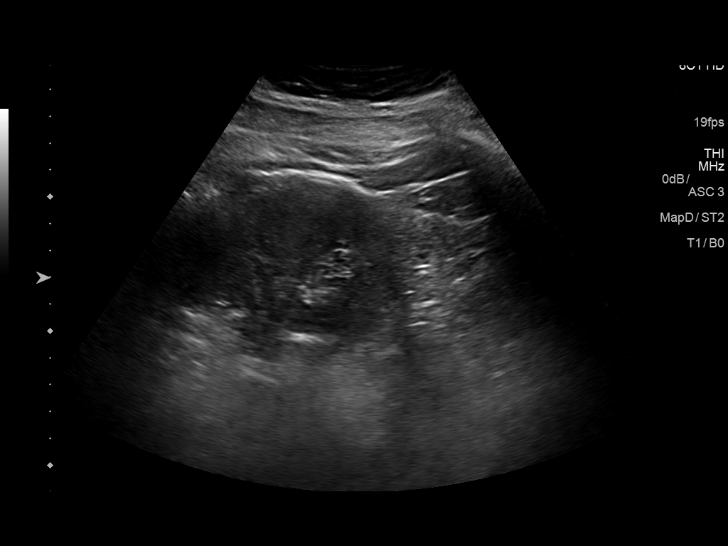
[im 65/71]
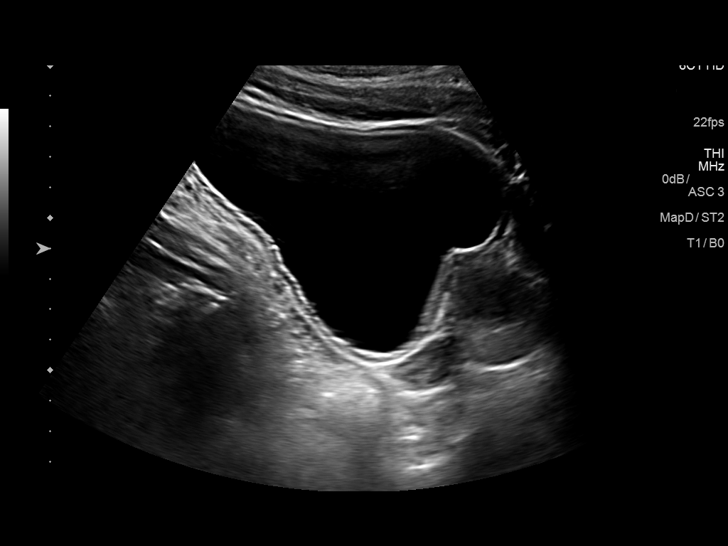
[im 71/71]
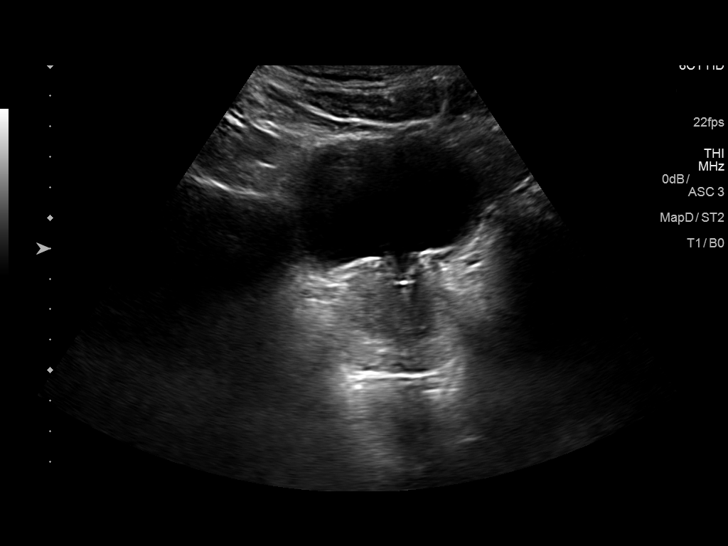

[13 of 25 positions shown; findings below may reference images not displayed]

FINDINGS: Right Kidney:

Length: 11.2 cm. Echogenicity within normal limits. No mass or
hydronephrosis visualized.

Left Kidney:

Length: 10.2 cm. Echogenicity within normal limits. No mass or
hydronephrosis visualized.

Bladder:  Normal appearance by ultrasound

RENAL DUPLEX ULTRASOUND

Right Renal Artery Velocities:

Origin:  61 cm/sec

Mid:  62 cm/sec

Hilum:  63 cm/sec

Interlobar:  49 cm/sec

Arcuate:  22 Cm/sec

Left Renal Artery Velocities:

Origin:  73 cm/sec

Mid:  140 cm/sec

Hilum:  71 cm/sec

Interlobar:  45 cm/sec

Arcuate:  20 cm/sec

Aortic Velocity:  124 Cm/sec

Right Renal-Aortic Ratios:

Origin:

Mid:

Hilum:

Interlobar:

Arcuate:

Left Renal-Aortic Ratios:

Origin:

Mid:

Hilum:

Interlobar:

Arcuate:

No acute finding by renal ultrasound. No evidence of significant
renal artery stenosis by ultrasound criteria. Renal veins appear
patent. Bladder unremarkable. Prostate gland mildly enlarged
measuring 6 x 4 x 4.4 cm.
IMPRESSION: No acute finding or evidence of renal artery stenosis by ultrasound
criteria.

## 2018-11-20 DIAGNOSIS — R7309 Other abnormal glucose: Secondary | ICD-10-CM | POA: Diagnosis not present

## 2018-11-20 DIAGNOSIS — I10 Essential (primary) hypertension: Secondary | ICD-10-CM | POA: Diagnosis not present

## 2018-11-20 DIAGNOSIS — R002 Palpitations: Secondary | ICD-10-CM | POA: Diagnosis not present

## 2018-11-20 DIAGNOSIS — E785 Hyperlipidemia, unspecified: Secondary | ICD-10-CM | POA: Diagnosis not present

## 2018-11-20 DIAGNOSIS — K279 Peptic ulcer, site unspecified, unspecified as acute or chronic, without hemorrhage or perforation: Secondary | ICD-10-CM | POA: Diagnosis not present

## 2018-11-20 DIAGNOSIS — N182 Chronic kidney disease, stage 2 (mild): Secondary | ICD-10-CM | POA: Diagnosis not present

## 2018-11-20 DIAGNOSIS — N183 Chronic kidney disease, stage 3 (moderate): Secondary | ICD-10-CM | POA: Diagnosis not present

## 2018-11-20 MED FILL — BYSTOLIC 10 MG TABLET: 10 | 90 days supply | Qty: 90 | Fill #0

## 2018-11-20 MED FILL — AMLODIPINE BESYLATE 10 MG T: 10 | 90 days supply | Qty: 90 | Fill #0

## 2019-01-02 DIAGNOSIS — N3943 Post-void dribbling: Secondary | ICD-10-CM | POA: Diagnosis not present

## 2019-01-02 DIAGNOSIS — N401 Enlarged prostate with lower urinary tract symptoms: Secondary | ICD-10-CM | POA: Diagnosis not present

## 2019-01-02 DIAGNOSIS — R3912 Poor urinary stream: Secondary | ICD-10-CM | POA: Diagnosis not present

## 2019-01-02 MED FILL — ATORVASTATIN 40 MG TABLET: 40 | 90 days supply | Qty: 90 | Fill #1

## 2019-01-02 MED FILL — TAMSULOSIN HCL 0.4 MG CAP: 0.4 | 90 days supply | Qty: 90 | Fill #0

## 2019-01-02 MED FILL — OMEPRAZOLE 40 MG CPDR: 40 | 30 days supply | Qty: 30 | Fill #1

## 2019-01-13 ENCOUNTER — Encounter: Payer: Self-pay | Admitting: Cardiology

## 2019-01-13 ENCOUNTER — Other Ambulatory Visit: Payer: Self-pay

## 2019-01-13 ENCOUNTER — Ambulatory Visit (INDEPENDENT_AMBULATORY_CARE_PROVIDER_SITE_OTHER): Payer: 59 | Admitting: Cardiology

## 2019-01-13 VITALS — BP 121/75 | HR 49 | Temp 97.7°F | Ht 72.0 in | Wt 251.0 lb

## 2019-01-13 DIAGNOSIS — I209 Angina pectoris, unspecified: Secondary | ICD-10-CM | POA: Insufficient documentation

## 2019-01-13 DIAGNOSIS — E782 Mixed hyperlipidemia: Secondary | ICD-10-CM | POA: Diagnosis not present

## 2019-01-13 DIAGNOSIS — R079 Chest pain, unspecified: Secondary | ICD-10-CM | POA: Insufficient documentation

## 2019-01-13 DIAGNOSIS — I10 Essential (primary) hypertension: Secondary | ICD-10-CM

## 2019-01-13 DIAGNOSIS — R001 Bradycardia, unspecified: Secondary | ICD-10-CM | POA: Diagnosis not present

## 2019-01-13 DIAGNOSIS — R002 Palpitations: Secondary | ICD-10-CM | POA: Insufficient documentation

## 2019-01-13 MED ORDER — ATORVASTATIN CALCIUM 80 MG PO TABS: 80.0000 mg | ORAL_TABLET | Freq: Every day | ORAL | 3 refills | Status: DC

## 2019-01-13 NOTE — Progress Notes (Signed)
Patient referred by Shade FloodGreene, Jeffrey R, MD for chest pain  Subjective:   Raymond Green, male    DOB: 1966-06-03, 53 y.o.   MRN: [id number]   Chief Complaint  Patient presents with  . New Patient (Initial Visit)  . Palpitations  . Chest Pain     HPI  53 y.o. African American male with hypertension, hyperlipidemia, family h/o CAD, referred for evaluation of chest pain.  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 He has had episodes of retrosternal pressure with exertion for last few weeks, that relieve with rest. He is currently on nebivolol and amlodipine with good blood pressure control. He is worried about his low heart rate. He denies any presyncope or syncope symptoms. He reports having undergone EGD in 06/2018 for certain abdominal symptoms, that showed gastritis. He denies any hematemesis, melena. His Hb is stable. He is currently on lipitor 40 mg, LDL remains around 130.   Past Medical History:  Diagnosis Date  . Allergy   . Anemia   . Erosive gastritis 2016  . GERD (gastroesophageal reflux disease)   . Headache(784.0)   . Hemorrhoids   . Hyperlipidemia   . Hypertension   . Kidney insufficiency   . LVH (left ventricular hypertrophy)    Dr. Donnie Ahoilley ( cardiology)  . Obesity   . OSA (obstructive sleep apnea)   . Positive PPD, treated 2000   INH  . Sleep apnea    uses cpap  . Vitamin D deficiency      Past Surgical History:  Procedure Laterality Date  . COLONOSCOPY  12/28/2011   Procedure: COLONOSCOPY;  Surgeon: Rachael Feeaniel P Jacobs, MD;  Location: [facility];  Service: Endoscopy;  Laterality: N/A;  . HEMORRHOID SURGERY    . UPPER GASTROINTESTINAL ENDOSCOPY       Social History   Socioeconomic History  . Marital status: Married    Spouse name: Not on file  . Number of children: 0  . Years of education: Not on file  . Highest education level: Not on file  Occupational History  . Occupation: ENDOSCOPY Set designerTECHNICIAN    Employer: [employer]   Social Needs  . Financial resource strain: Not on file  . Food insecurity    Worry: Not on file    Inability: Not on file  . Transportation needs    Medical: Not on file    Non-medical: Not on file  Tobacco Use  . Smoking status: Never Smoker  . Smokeless tobacco: Never Used  Substance and Sexual Activity  . Alcohol use: No    Alcohol/week: 0.0 standard drinks  . Drug use: No  . Sexual activity: Yes  Lifestyle  . Physical activity    Days per week: Not on file    Minutes per session: Not on file  . Stress: Not on file  Relationships  . Social Musicianconnections    Talks on phone: Not on file    Gets together: Not on file    Attends religious service: Not on file    Active member of club or organization: Not on file    Attends meetings of clubs or organizations: Not on file    Relationship status: Not on file  . Intimate partner violence    Fear of current or ex partner: Not on file    Emotionally abused: Not on file    Physically abused: Not on file    Forced sexual activity: Not on file  Other Topics Concern  .  Not on file  Social History Narrative   Married   Education: College   Exercise: Yes     Family History  Problem Relation Age of Onset  . Diabetes Mother   . Heart disease Mother   . Hypertension Mother   . Hypertension Father   . Diabetes Sister   . Colon cancer Neg Hx   . Colon polyps Neg Hx   . Esophageal cancer Neg Hx   . Rectal cancer Neg Hx   . Stomach cancer Neg Hx      Current Outpatient Medications on File Prior to Visit  Medication Sig Dispense Refill  . allopurinol (ZYLOPRIM) 100 MG tablet Take 1 tablet (100 mg total) by mouth daily. 90 tablet 3  . amLODipine (NORVASC) 5 MG tablet Take 1 tablet (5 mg total) by mouth daily. 90 tablet 0  . atorvastatin (LIPITOR) 40 MG tablet Take 1 tablet (40 mg total) by mouth daily. 90 tablet 3  . colchicine 0.6 MG tablet Take 2 initially, then one tonight, then take one tablet daily until gout improved  (Patient not taking: Reported on 06/10/2018) 20 tablet 1  . docusate sodium (COLACE) 100 MG capsule Take 100 mg by mouth daily.    Marland Kitchen. losartan (COZAAR) 100 MG tablet Take 1 tablet (100 mg total) by mouth daily. 90 tablet 1  . Nebivolol HCl (BYSTOLIC) 20 MG TABS Take 1 tablet (20 mg total) by mouth daily. 90 tablet 0  . omeprazole (PRILOSEC) 40 MG capsule Take 1 capsule (40 mg total) by mouth daily. Call for an April appointment. 30 capsule 0   Current Facility-Administered Medications on File Prior to Visit  Medication Dose Route Frequency Provider Last Rate Last Dose  . 0.9 %  sodium chloride infusion  500 mL Intravenous Once Rachael FeeJacobs, Daniel P, MD        Cardiovascular studies:  EKG 01/13/2019: Sinus bradycardia 47 bpm Left ventricular hypertrophy Inferolateral ST-T changes likely secondary to LVH    Recent labs: Results for Raymond FountainWAKA, Sahil A (MRN [id number]) as of 01/13/2019 13:20  Ref. Range 12/11/2017 17:08 07/23/2018 17:37  COMPREHENSIVE METABOLIC PANEL Unknown Rpt (A) Rpt (A)  Sodium Latest Ref Range: 134 - 144 mmol/L 140 141  Potassium Latest Ref Range: 3.5 - 5.2 mmol/L 4.2 4.2  Chloride Latest Ref Range: 96 - 106 mmol/L 102 102  CO2 Latest Ref Range: 20 - 29 mmol/L 25 23  Glucose Latest Ref Range: 65 - 99 mg/dL 80 84  BUN Latest Ref Range: 6 - 24 mg/dL 13 15  Creatinine Latest Ref Range: 0.76 - 1.27 mg/dL 4.091.43 (H) 8.111.31 (H)  Calcium Latest Ref Range: 8.7 - 10.2 mg/dL 9.9 9.8  BUN/Creatinine Ratio Latest Ref Range: 9 - 20  9 11   Alkaline Phosphatase Latest Ref Range: 39 - 117 IU/L 61 62  Albumin Latest Ref Range: 3.8 - 4.9 g/dL 4.3 4.1  Albumin/Globulin Ratio Latest Ref Range: 1.2 - 2.2  1.4 1.3  AST Latest Ref Range: 0 - 40 IU/L 23 21  ALT Latest Ref Range: 0 - 44 IU/L 17 18  Total Protein Latest Ref Range: 6.0 - 8.5 g/dL 7.3 7.3  Total Bilirubin Latest Ref Range: 0.0 - 1.2 mg/dL 0.5 0.5  GFR, Est Non African American Latest Ref Range: >59 mL/min/1.73 56 (L) 62  GFR, Est  African American Latest Ref Range: >59 mL/min/1.73 65 72  Total CHOL/HDL Ratio Latest Ref Range: 0.0 - 5.0 ratio 4.3   Cholesterol, Total Latest Ref Range: 100 -  199 mg/dL 213 (H)   HDL Cholesterol Latest Ref Range: >39 mg/dL 50   LDL (calc) Latest Ref Range: 0 - 99 mg/dL 137 (H)   Triglycerides Latest Ref Range: 0 - 149 mg/dL 130   VLDL Cholesterol Cal Latest Ref Range: 5 - 40 mg/dL 26    Results for ENDY, EASTERLY (MRN [id number]) as of 01/13/2019 13:20  Ref. Range 06/03/2018 16:11  WBC Latest Ref Range: 4.0 - 10.5 K/uL 5.5  RBC Latest Ref Range: 4.22 - 5.81 Mil/uL 4.48  Hemoglobin Latest Ref Range: 13.0 - 17.0 g/dL 13.5  HCT Latest Ref Range: 39.0 - 52.0 % 39.8  MCV Latest Ref Range: 78.0 - 100.0 fl 88.9  MCHC Latest Ref Range: 30.0 - 36.0 g/dL 33.9  RDW Latest Ref Range: 11.5 - 15.5 % 13.7  Platelets Latest Ref Range: 150.0 - 400.0 K/uL 167.0    Review of Systems  Constitution: Negative for decreased appetite, malaise/fatigue, weight gain and weight loss.  HENT: Negative for congestion.   Eyes: Negative for visual disturbance.  Cardiovascular: Positive for chest pain. Negative for dyspnea on exertion, leg swelling, palpitations and syncope.  Respiratory: Negative for cough.   Endocrine: Negative for cold intolerance.  Hematologic/Lymphatic: Does not bruise/bleed easily.  Skin: Negative for itching and rash.  Musculoskeletal: Negative for myalgias.  Gastrointestinal: Positive for bloating. Negative for abdominal pain, nausea and vomiting.  Genitourinary: Negative for dysuria.  Neurological: Negative for dizziness and weakness.  Psychiatric/Behavioral: The patient is not nervous/anxious.   All other systems reviewed and are negative.        Vitals:   01/13/19 1516  BP: 121/75  Pulse: (!) 49  Temp: 97.7 F (36.5 C)  SpO2: 98%     Body mass index is 34.04 kg/m. Filed Weights   01/13/19 1516  Weight: 113.9 kg     Objective:   Physical Exam  Constitutional:  He is oriented to person, place, and time. He appears well-developed and well-nourished. No distress.  HENT:  Head: Normocephalic and atraumatic.  Eyes: Pupils are equal, round, and reactive to light. Conjunctivae are normal.  Neck: No JVD present.  Cardiovascular: Normal rate, regular rhythm and intact distal pulses.  Pulmonary/Chest: Effort normal and breath sounds normal. He has no wheezes. He has no rales.  Abdominal: Soft. Bowel sounds are normal. There is no rebound.  Musculoskeletal:        General: No edema.  Lymphadenopathy:    He has no cervical adenopathy.  Neurological: He is alert and oriented to person, place, and time. No cranial nerve deficit.  Skin: Skin is warm and dry.  Psychiatric: He has a normal mood and affect.  Nursing note and vitals reviewed.         Assessment & Recommendations:   53 y.o. African American male with hypertension, hyperlipidemia, family h/o CAD, referred for evaluation of chest pain.  1. Bradycardia Asymptomatic. Likely due to beta blocker use. No other workup necessary.  2. Angina pectoris ([place]) Concerning for obstructive CAD, given risk factors and presentation. He is on two anti anginal medications with good blood pressure control. With resting HR in 40s, cor CTA will be excellent test to evaluate his coronary anatomy +/- physiology.   3. Mixed hyperlipidemia Increase lipitor to 80 mg daily.     Thank you for referring the patient to [place]. Please feel free to contact with any questions.  Nigel Mormon, MD [facility] Pager: [phone] Office: [phone] If no answer Cell [phone]

## 2019-01-14 ENCOUNTER — Encounter: Payer: Self-pay | Admitting: Cardiology

## 2019-01-14 ENCOUNTER — Other Ambulatory Visit: Payer: Self-pay | Admitting: Cardiology

## 2019-01-14 DIAGNOSIS — R079 Chest pain, unspecified: Secondary | ICD-10-CM

## 2019-01-14 DIAGNOSIS — E782 Mixed hyperlipidemia: Secondary | ICD-10-CM | POA: Insufficient documentation

## 2019-01-22 ENCOUNTER — Ambulatory Visit (INDEPENDENT_AMBULATORY_CARE_PROVIDER_SITE_OTHER): Payer: 59

## 2019-01-22 ENCOUNTER — Other Ambulatory Visit: Payer: Self-pay

## 2019-01-22 ENCOUNTER — Telehealth (HOSPITAL_COMMUNITY): Payer: Self-pay | Admitting: Emergency Medicine

## 2019-01-22 DIAGNOSIS — R079 Chest pain, unspecified: Secondary | ICD-10-CM | POA: Diagnosis not present

## 2019-01-22 DIAGNOSIS — R002 Palpitations: Secondary | ICD-10-CM

## 2019-01-22 DIAGNOSIS — I10 Essential (primary) hypertension: Secondary | ICD-10-CM

## 2019-01-22 NOTE — Telephone Encounter (Signed)
Left message on voicemail with name and callback number Joaquina Nissen RN Navigator Cardiac Imaging [facility] [phone] Office [phone] Cell  

## 2019-01-23 ENCOUNTER — Ambulatory Visit (HOSPITAL_COMMUNITY): Payer: 59

## 2019-01-23 ENCOUNTER — Ambulatory Visit (HOSPITAL_COMMUNITY): Admission: RE | Admit: 2019-01-23 | Payer: 59 | Source: Ambulatory Visit

## 2019-01-24 ENCOUNTER — Telehealth (HOSPITAL_COMMUNITY): Payer: Self-pay | Admitting: Emergency Medicine

## 2019-01-24 NOTE — Telephone Encounter (Signed)
ERROR

## 2019-01-29 ENCOUNTER — Telehealth (HOSPITAL_COMMUNITY): Payer: Self-pay | Admitting: Emergency Medicine

## 2019-01-29 NOTE — Telephone Encounter (Signed)
Left message on voicemail with name and callback number Giovonni Poirier RN Navigator Cardiac Imaging [facility] [phone] Office [phone] Cell  

## 2019-01-30 ENCOUNTER — Ambulatory Visit (HOSPITAL_COMMUNITY)
Admission: RE | Admit: 2019-01-30 | Discharge: 2019-01-30 | Disposition: A | Discharge status: Home / Self Care | Payer: 59 | Source: Ambulatory Visit | Attending: Cardiology | Admitting: Cardiology

## 2019-01-30 ENCOUNTER — Other Ambulatory Visit: Payer: Self-pay

## 2019-01-30 ENCOUNTER — Ambulatory Visit (HOSPITAL_COMMUNITY): Admission: RE | Admit: 2019-01-30 | Payer: 59 | Source: Ambulatory Visit

## 2019-01-30 DIAGNOSIS — I209 Angina pectoris, unspecified: Secondary | ICD-10-CM | POA: Diagnosis not present

## 2019-01-30 DIAGNOSIS — R079 Chest pain, unspecified: Secondary | ICD-10-CM | POA: Diagnosis not present

## 2019-01-30 MED ORDER — NITROGLYCERIN 0.4 MG SL SUBL
SUBLINGUAL_TABLET | SUBLINGUAL | Status: AC
  Filled 2019-01-30: qty 2

## 2019-01-30 MED ORDER — NITROGLYCERIN 0.4 MG SL SUBL
0.8000 mg | SUBLINGUAL_TABLET | Freq: Once | SUBLINGUAL | Status: AC
  Administered 2019-01-30: 11:00:00 0.8 mg via SUBLINGUAL
  Filled 2019-01-30: qty 25

## 2019-01-30 MED ORDER — IOHEXOL 350 MG/ML SOLN
80.0000 mL | Freq: Once | INTRAVENOUS | Status: AC | PRN
  Administered 2019-01-30: 80 mL via INTRAVENOUS

## 2019-01-31 NOTE — Progress Notes (Signed)
S/w patient gave him results

## 2019-02-03 ENCOUNTER — Encounter (HOSPITAL_COMMUNITY): Payer: Self-pay

## 2019-02-06 ENCOUNTER — Ambulatory Visit (INDEPENDENT_AMBULATORY_CARE_PROVIDER_SITE_OTHER): Payer: 59 | Admitting: Family Medicine

## 2019-02-06 ENCOUNTER — Encounter: Payer: Self-pay | Admitting: Family Medicine

## 2019-02-06 ENCOUNTER — Other Ambulatory Visit: Payer: Self-pay

## 2019-02-06 VITALS — BP 123/72 | HR 56 | Temp 98.2°F | Resp 14 | Wt 251.6 lb

## 2019-02-06 DIAGNOSIS — N401 Enlarged prostate with lower urinary tract symptoms: Secondary | ICD-10-CM | POA: Diagnosis not present

## 2019-02-06 DIAGNOSIS — K219 Gastro-esophageal reflux disease without esophagitis: Secondary | ICD-10-CM | POA: Diagnosis not present

## 2019-02-06 DIAGNOSIS — Z0001 Encounter for general adult medical examination with abnormal findings: Secondary | ICD-10-CM | POA: Diagnosis not present

## 2019-02-06 DIAGNOSIS — K2970 Gastritis, unspecified, without bleeding: Secondary | ICD-10-CM | POA: Diagnosis not present

## 2019-02-06 DIAGNOSIS — E785 Hyperlipidemia, unspecified: Secondary | ICD-10-CM

## 2019-02-06 DIAGNOSIS — I10 Essential (primary) hypertension: Secondary | ICD-10-CM | POA: Diagnosis not present

## 2019-02-06 DIAGNOSIS — M109 Gout, unspecified: Secondary | ICD-10-CM

## 2019-02-06 DIAGNOSIS — Z0000 Encounter for general adult medical examination without abnormal findings: Secondary | ICD-10-CM

## 2019-02-06 DIAGNOSIS — N189 Chronic kidney disease, unspecified: Secondary | ICD-10-CM | POA: Diagnosis not present

## 2019-02-06 DIAGNOSIS — R1012 Left upper quadrant pain: Secondary | ICD-10-CM | POA: Diagnosis not present

## 2019-02-06 DIAGNOSIS — E782 Mixed hyperlipidemia: Secondary | ICD-10-CM

## 2019-02-06 MED ORDER — OMEPRAZOLE 40 MG PO CPDR: 40.0000 mg | DELAYED_RELEASE_CAPSULE | Freq: Every day | ORAL | 0 refills | Status: DC

## 2019-02-06 MED ORDER — LOSARTAN POTASSIUM 100 MG PO TABS: 100.0000 mg | ORAL_TABLET | Freq: Every day | ORAL | 1 refills | Status: DC

## 2019-02-06 MED ORDER — COLCHICINE 0.6 MG PO TABS: ORAL_TABLET | ORAL | 1 refills | Status: DC

## 2019-02-06 MED ORDER — BYSTOLIC 20 MG PO TABS: 1.0000 | ORAL_TABLET | Freq: Every day | ORAL | 1 refills | Status: DC

## 2019-02-06 MED ORDER — AMLODIPINE BESYLATE 5 MG PO TABS: 5.0000 mg | ORAL_TABLET | Freq: Every day | ORAL | 1 refills | Status: DC

## 2019-02-06 MED ORDER — ALLOPURINOL 100 MG PO TABS: 100.0000 mg | ORAL_TABLET | Freq: Every day | ORAL | 3 refills | Status: DC

## 2019-02-06 MED FILL — COLCHICINE 0.6 MG TABS: 0.6 | 3 days supply | Qty: 6 | Fill #0

## 2019-02-06 MED FILL — BYSTOLIC 20 MG TABLET: 20 | 90 days supply | Qty: 90 | Fill #0

## 2019-02-06 MED FILL — ALLOPURINOL 100 MG TABS: 100 | 90 days supply | Qty: 90 | Fill #0

## 2019-02-06 MED FILL — OMEPRAZOLE 40 MG CPDR: 40 | 90 days supply | Qty: 90 | Fill #0

## 2019-02-06 MED FILL — AMLODIPINE BESYLATE 5 MG TA: 5 | 90 days supply | Qty: 90 | Fill #0

## 2019-02-06 MED FILL — LOSARTAN POTASSIUM 100 MG T: 100 | 30 days supply | Qty: 30 | Fill #0

## 2019-02-06 NOTE — Patient Instructions (Addendum)
Call Dr. Jacobs for follow up and to decide on next step for your abdominal symptoms. pepcid at bedtime for now canChristella Hartigan be used to see if that helps or can discuss with Dr. Christella HartiganJacobs office.  Return to the clinic or go to the nearest emergency room if any of your symptoms worsen or new symptoms occur. Colace or miralax if needed for hard stools. Drink plenty of fluids and fiber in the diet.  Continue to avoid NSAIDs both for your stomach issues and chronic kidney disease.  I will check kidney function again today.  Make sure to stay well-hydrated.  Follow-up in 6 months for medication review.  Dentist option if needed: [facility]  Address: [address], [place], [place] [address]  [email or web address]  Phone: [phone]  Exercise recommendation - talk to cardiologist, but low intensity to start and slowly increase per recommendations of your cardiologist.   Keeping you healthy  Get these tests  Blood pressure- Have your blood pressure checked once a year by your healthcare provider.  Normal blood pressure is 120/80  Weight- Have your body mass index (BMI) calculated to screen for obesity.  BMI is a measure of body fat based on height and weight. You can also calculate your own BMI at [email or web address]/.  Cholesterol- Have your cholesterol checked every year.  Diabetes- Have your blood sugar checked regularly if you have high blood pressure, high cholesterol, have a family history of diabetes or if you are overweight.  Screening for Colon Cancer- Colonoscopy starting at age 53.  Screening may begin sooner depending on your family history and other health conditions. Follow up colonoscopy as directed by your Gastroenterologist.  Screening for Prostate Cancer- Both blood work (PSA) and a rectal exam help screen for Prostate Cancer.  Screening begins at age 53 with African-American men and at age 53 with Caucasian men.  Screening may begin sooner depending on your family  history.  Take these medicines  Aspirin- One aspirin daily can help prevent Heart disease and Stroke.  Flu shot- Every fall.  Tetanus- Every 10 years.  Zostavax- Once after the age of 53 to prevent Shingles.  Pneumonia shot- Once after the age of 53; if you are younger than 6965, ask your healthcare provider if you need a Pneumonia shot.  Take these steps  Don't smoke- If you do smoke, talk to your doctor about quitting.  For tips on how to quit, go to www.smokefree.gov or call 1-800-QUIT-NOW.  Be physically active- Exercise 5 days a week for at least 30 minutes.  If you are not already physically active start slow and gradually work up to 30 minutes of moderate physical activity.  Examples of moderate activity include walking briskly, mowing the yard, dancing, swimming, bicycling, etc.  Eat a healthy diet- Eat a variety of healthy food such as fruits, vegetables, low fat milk, low fat cheese, yogurt, lean meant, poultry, fish, beans, tofu, etc. For more information go to www.thenutritionsource.org  Drink alcohol in moderation- Limit alcohol intake to less than two drinks a day. Never drink and drive.  Dentist- Brush and floss twice daily; visit your dentist twice a year.  Depression- Your emotional health is as important as your physical health. If you're feeling down, or losing interest in things you would normally enjoy please talk to your healthcare provider.  Eye exam- Visit your eye doctor every year.  Safe sex- If you may be exposed to a sexually transmitted infection, use a condom.  Seat belts- Seat belts  can save your life; always wear one.  Smoke/Carbon Monoxide detectors- These detectors need to be installed on the appropriate level of your home.  Replace batteries at least once a year.  Skin cancer- When out in the sun, cover up and use sunscreen 15 SPF or higher.  Violence- If anyone is threatening you, please tell your healthcare provider.  Living Will/ Health care  power of attorney- Speak with your healthcare provider and family.  If you have lab work done today you will be contacted with your lab results within the next 2 weeks.  If you have not heard from [place] then please contact us. The fastest way to get your results is to register for My Chart.   IF you received an x-ray today, you will receive an invoice from [facility]. Please contact [facility] at [phone] with questions or concerns regarding your invoice.   IF you received labwork today, you will receive an invoice from [facility]. Please contact [facility] at [phone] with questions or concerns regarding your invoice.   Our billing staff will not be able to assist you with questions regarding bills from these companies.  You will be contacted with the lab results as soon as they are available. The fastest way to get your results is to activate your My Chart account. Instructions are located on the last page of this paperwork. If you have not heard from [place] regarding the results in 2 weeks, please contact this office.

## 2019-02-06 NOTE — Progress Notes (Signed)
Subjective:    Patient ID: Raymond Green, male    DOB: April 20, 1966, 53 y.o.   MRN: [id number]  HPI Raymond Green is a 53 y.o. male Presents today for: Chief Complaint  Patient presents with  . Annual Exam    Here for his annual physical and need a refill on cozaar med. Would like to toch base on an old pain he have in left low quard area. Seen Gastro they did not find anything and would like to see what we can do from here   Here for annual exam, follow-up of the above issue.  Hypertension: BP Readings from Last 3 Encounters:  02/06/19 123/72  01/30/19 135/81  01/13/19 121/75   Lab Results  Component Value Date   CREATININE 1.31 (H) 07/23/2018  Currently on amlodipine 5 mg daily, Bystolic 20 mg daily, losartan 100 mg daily Creat range -since 2017: 1.31-1.82.   Gout: Last flare:no recent flare.  Daily meds: Allopurinol 100 mg daily Prn med: colchicine - need new rx.  Lab Results  Component Value Date   LABURIC 6.7 11/22/2017   Hyperlipidemia:  Lab Results  Component Value Date   CHOL 213 (H) 12/11/2017   HDL 50 12/11/2017   LDLCALC 137 (H) 12/11/2017   TRIG 130 12/11/2017   CHOLHDL 4.3 12/11/2017   Lab Results  Component Value Date   ALT 18 07/23/2018   AST 21 07/23/2018   ALKPHOS 62 07/23/2018   BILITOT 0.5 07/23/2018  Lipitor 80 mg daily - since July. Prior 40mg .  Cardiologist Dr. Rosemary HolmsPatwardhan.  Appointment July 27.  Bradycardia likely due to beta-blocker use without further work-up recommended.  Was having some anginal symptoms, plan for coronary CTA.  Lipitor was increased to 80 mg at that time. Coronary CT 8/13 - CCS of 1, nonobstructive CAD in proximal LAD.  appt on 8/27 with cardiology.    BPH with lower urinary tract symptoms: Followed by Dr. Alvester MorinBell with [facility], recent visit in July.  Tamsulosin has been recommended.  Postvoid residual 0 at that time. Has been taking flomax daily. Still has some lower pelvic pain at times. Considering  cystoscopy - next appt soon.   Recurrent/chronic left quadrant abdominal pain. See prior notes. Longstanding abdominal pain for few years.  Treated with Colace, MiraLAX for constipation in the past.  MRI of his abdomen in 2016 without explanation for left abdominal pain at that time.  Was seen by gastroenterology, Dr. Ewing SchleinMagod.  Reported recommendation of use of Aleve last year, then recommended follow-up. Did have an upper GI endoscopy January 7 of this year - Dr. Christella HartiganJacobs, had hematemeis prior.  Notable for small erosions throughout the stomach, mild pangastritis with erythema and friability.  Plan for daily PPI.  Biopsies did not indicate H. pylori.  Start on omeprazole 40 mg daily then follow-up with Dr. Christella HartiganJacobs planned in 2 months.  Still on omeprazole daily. Has not had follow up since January (covid pandemic) Still some burning pain at night. He plans to discuss with GI- has not scheduled appointment.  Hard stool about a month ago - small BRBPR at that time with straining -none since.  No recent constipation, not using colace/miralax.   Cancer screening: Colon: colonscopy 09/24/14 - 10 year follow up.  Prostate - followed by urology with close follow up planned.   Immunization History  Administered Date(s) Administered  . Influenza,inj,Quad PF,6+ Mos 03/23/2014, 02/22/2015  . Influenza-Unspecified 03/19/2017  . Meningococcal Conjugate 01/12/2014  . Tdap 09/01/2015  .  Zoster Recombinat (Shingrix) 12/13/2017, 02/22/2018  plans on flu shot at work.   Depression screen [facility] 2/9 02/06/2019 07/23/2018 12/11/2017 11/22/2017 10/22/2017  Decreased Interest 0 0 0 0 0  Down, Depressed, Hopeless 0 0 0 0 0  PHQ - 2 Score 0 0 0 0 0    Hearing Screening   125Hz  250Hz  500Hz  1000Hz  2000Hz  3000Hz  4000Hz  6000Hz  8000Hz   Right ear:           Left ear:             Visual Acuity Screening   Right eye Left eye Both eyes  Without correction:     With correction: 20/20 20/30 20/20   appt last year - plans on appt  later this year.   Dentist: no recent visit.   No regular exercise - some yard work.   Declines STI screening. Monogamous with wife, relationship going well.    Patient Active Problem List   Diagnosis Date Noted  . Mixed hyperlipidemia 01/14/2019  . Chest pain 01/13/2019  . Palpitations 01/13/2019  . Low back pain 07/13/2016  . Abdominal pain, left lower quadrant 05/17/2016  . Elevated serum creatinine 05/29/2015  . Gout 10/29/2013  . Essential hypertension 03/02/2008  . GERD 03/02/2008  . Iron deficiency anemia 12/13/2007  . HEMORRHOIDS-EXTERNAL 12/13/2007   Past Medical History:  Diagnosis Date  . Allergy   . Anemia   . Erosive gastritis 2016  . GERD (gastroesophageal reflux disease)   . Headache(784.0)   . Hemorrhoids   . Hyperlipidemia   . Hypertension   . Kidney insufficiency   . LVH (left ventricular hypertrophy)    Dr. Wynonia Lawman ( cardiology)  . Obesity   . OSA (obstructive sleep apnea)   . Positive PPD, treated 2000   INH  . Sleep apnea    uses cpap  . Vitamin D deficiency    Past Surgical History:  Procedure Laterality Date  . COLONOSCOPY  12/28/2011   Procedure: COLONOSCOPY;  Surgeon: Milus Banister, MD;  Location: [facility];  Service: Endoscopy;  Laterality: N/A;  . HEMORRHOID SURGERY    . UPPER GASTROINTESTINAL ENDOSCOPY     Allergies  Allergen Reactions  . Lisinopril     angioedema  . Shrimp [Shellfish Allergy] Nausea And Vomiting   Prior to Admission medications   Medication Sig Start Date End Date Taking? Authorizing Provider  allopurinol (ZYLOPRIM) 100 MG tablet Take 1 tablet (100 mg total) by mouth daily. 01/02/17  Yes Timmothy Euler, [place] D, PA-C  amLODipine (NORVASC) 5 MG tablet Take 1 tablet (5 mg total) by mouth daily. 07/23/18  Yes Rutherford Guys, MD  atorvastatin (LIPITOR) 80 MG tablet Take 1 tablet (80 mg total) by mouth daily. 01/13/19  Yes Patwardhan, Manish J, MD  losartan (COZAAR) 100 MG tablet Take 1 tablet (100 mg total) by mouth  daily. 07/23/18  Yes Rutherford Guys, MD  Nebivolol HCl (BYSTOLIC) 20 MG TABS Take 1 tablet (20 mg total) by mouth daily. 07/23/18  Yes Rutherford Guys, MD  omeprazole (PRILOSEC) 40 MG capsule Take 1 capsule (40 mg total) by mouth daily. Call for an April appointment. 08/27/18  Yes Milus Banister, MD  tamsulosin (FLOMAX) 0.4 MG CAPS capsule daily. 01/02/19  Yes [provider]   Social History   Socioeconomic History  . Marital status: Married    Spouse name: Not on file  . Number of children: 0  . Years of education: Not on file  . Highest education level: Not  on file  Occupational History  . Occupation: ENDOSCOPY Set designerTECHNICIAN    Employer: [employer]  Social Needs  . Financial resource strain: Not on file  . Food insecurity    Worry: Not on file    Inability: Not on file  . Transportation needs    Medical: Not on file    Non-medical: Not on file  Tobacco Use  . Smoking status: Never Smoker  . Smokeless tobacco: Never Used  Substance and Sexual Activity  . Alcohol use: No    Alcohol/week: 0.0 standard drinks  . Drug use: No  . Sexual activity: Yes  Lifestyle  . Physical activity    Days per week: Not on file    Minutes per session: Not on file  . Stress: Not on file  Relationships  . Social Musicianconnections    Talks on phone: Not on file    Gets together: Not on file    Attends religious service: Not on file    Active member of club or organization: Not on file    Attends meetings of clubs or organizations: Not on file    Relationship status: Not on file  . Intimate partner violence    Fear of current or ex partner: Not on file    Emotionally abused: Not on file    Physically abused: Not on file    Forced sexual activity: Not on file  Other Topics Concern  . Not on file  Social History Narrative   Married   Education: College   Exercise: Yes    Review of Systems 13 point review of systems per patient health survey noted.  Negative other than as indicated  above or in HPI.      Objective:   Physical Exam Vitals signs reviewed.  Constitutional:      Appearance: He is well-developed.  HENT:     Head: Normocephalic and atraumatic.     Right Ear: External ear normal.     Left Ear: External ear normal.  Eyes:     Conjunctiva/sclera: Conjunctivae normal.     Pupils: Pupils are equal, round, and reactive to light.  Neck:     Musculoskeletal: Normal range of motion and neck supple.     Thyroid: No thyromegaly.  Cardiovascular:     Rate and Rhythm: Normal rate and regular rhythm.     Heart sounds: Normal heart sounds.  Pulmonary:     Effort: Pulmonary effort is normal. No respiratory distress.     Breath sounds: Normal breath sounds. No wheezing.  Abdominal:     General: There is no distension.     Palpations: Abdomen is soft.     Tenderness: There is no abdominal tenderness.  Musculoskeletal: Normal range of motion.        General: No tenderness.  Lymphadenopathy:     Cervical: No cervical adenopathy.  Skin:    General: Skin is warm and dry.  Neurological:     Mental Status: He is alert and oriented to person, place, and time.     Deep Tendon Reflexes: Reflexes are normal and symmetric.  Psychiatric:        Behavior: Behavior normal.    Vitals:   02/06/19 1417  BP: 123/72  Pulse: (!) 56  Resp: 14  Temp: 98.2 F (36.8 C)  TempSrc: Oral  SpO2: 97%  Weight: 251 lb 9.6 oz (114.1 kg)      Assessment & Plan:   Raymond Green is a 53 y.o. male Annual  physical exam  - -anticipatory guidance as below in AVS, screening labs above. Health maintenance items as above in HPI discussed/recommended as applicable.   Gastroesophageal reflux disease, esophagitis presence not specified - Plan: omeprazole (PRILOSEC) 40 MG capsule LUQ pain Gastritis without bleeding, unspecified chronicity, unspecified gastritis type  - follow up with GI. Continue PPI, option of H2 in PM. Colace/miralax for constipation as needed. rtc precautions.    Chronic kidney disease, unspecified CKD stage  - avoid nsaids, maintain hydration. Monitor labs.   Hyperlipidemia, unspecified hyperlipidemia type - Plan: Lipid Panel, Comprehensive metabolic panel, Mixed hyperlipidemia  - continue lipitor. Labs pending. Cardiology follow up as planned.   Benign prostatic hyperplasia with lower urinary tract symptoms, symptom details unspecified Essential hypertension - Plan: losartan (COZAAR) 100 MG tablet  - stable, no med changes.   Benign essential HTN - Plan: Comprehensive metabolic panel, Nebivolol HCl (BYSTOLIC) 20 MG TABS, amLODipine (NORVASC) 5 MG tablet   - Stable, tolerating current regimen. Medications refilled. Labs pending as above.   Gout, unspecified cause, unspecified chronicity, unspecified site - Plan: colchicine 0.6 MG tablet Gout of foot, unspecified cause, unspecified chronicity, unspecified laterality - Plan: allopurinol (ZYLOPRIM) 100 MG tablet   continue allopurinol.    Meds ordered this encounter  Medications  . colchicine 0.6 MG tablet    Sig: Take 2 tabs by mouth at onset of flair, then 1 tab 1 hour later if needed.    Dispense:  6 tablet    Refill:  1  . Nebivolol HCl (BYSTOLIC) 20 MG TABS    Sig: Take 1 tablet (20 mg total) by mouth daily.    Dispense:  90 tablet    Refill:  1  . allopurinol (ZYLOPRIM) 100 MG tablet    Sig: Take 1 tablet (100 mg total) by mouth daily.    Dispense:  90 tablet    Refill:  3  . losartan (COZAAR) 100 MG tablet    Sig: Take 1 tablet (100 mg total) by mouth daily.    Dispense:  90 tablet    Refill:  1  . omeprazole (PRILOSEC) 40 MG capsule    Sig: Take 1 capsule (40 mg total) by mouth daily.    Dispense:  90 capsule    Refill:  0  . amLODipine (NORVASC) 5 MG tablet    Sig: Take 1 tablet (5 mg total) by mouth daily.    Dispense:  90 tablet    Refill:  1   Patient Instructions    Call Dr. Christella Hartigan for follow up and to decide on next step for your abdominal symptoms. pepcid at  bedtime for now can be used to see if that helps or can discuss with Dr. Christella Hartigan office.  Return to the clinic or go to the nearest emergency room if any of your symptoms worsen or new symptoms occur. Colace or miralax if needed for hard stools. Drink plenty of fluids and fiber in the diet.  Continue to avoid NSAIDs both for your stomach issues and chronic kidney disease.  I will check kidney function again today.  Make sure to stay well-hydrated.  Follow-up in 6 months for medication review.  Dentist option if needed: [facility]  Address: [address] [place], [place], [place] [address]  [email or web address]  Phone: [phone]  Exercise recommendation - talk to cardiologist, but low intensity to start and slowly increase per recommendations of your cardiologist.   Keeping you healthy  Get these tests  Blood pressure- Have your blood  pressure checked once a year by your healthcare provider.  Normal blood pressure is 120/80  Weight- Have your body mass index (BMI) calculated to screen for obesity.  BMI is a measure of body fat based on height and weight. You can also calculate your own BMI at [email or web address].  Cholesterol- Have your cholesterol checked every year.  Diabetes- Have your blood sugar checked regularly if you have high blood pressure, high cholesterol, have a family history of diabetes or if you are overweight.  Screening for Colon Cancer- Colonoscopy starting at age 60.  Screening may begin sooner depending on your family history and other health conditions. Follow up colonoscopy as directed by your Gastroenterologist.  Screening for Prostate Cancer- Both blood work (PSA) and a rectal exam help screen for Prostate Cancer.  Screening begins at age 46 with African-American men and at age 62 with Caucasian men.  Screening may begin sooner depending on your family history.  Take these medicines  Aspirin- One aspirin daily can help prevent Heart disease and  Stroke.  Flu shot- Every fall.  Tetanus- Every 10 years.  Zostavax- Once after the age of 65 to prevent Shingles.  Pneumonia shot- Once after the age of 32; if you are younger than 72, ask your healthcare provider if you need a Pneumonia shot.  Take these steps  Don't smoke- If you do smoke, talk to your doctor about quitting.  For tips on how to quit, go to www.smokefree.gov or call 1-800-QUIT-NOW.  Be physically active- Exercise 5 days a week for at least 30 minutes.  If you are not already physically active start slow and gradually work up to 30 minutes of moderate physical activity.  Examples of moderate activity include walking briskly, mowing the yard, dancing, swimming, bicycling, etc.  Eat a healthy diet- Eat a variety of healthy food such as fruits, vegetables, low fat milk, low fat cheese, yogurt, lean meant, poultry, fish, beans, tofu, etc. For more information go to www.thenutritionsource.org  Drink alcohol in moderation- Limit alcohol intake to less than two drinks a day. Never drink and drive.  Dentist- Brush and floss twice daily; visit your dentist twice a year.  Depression- Your emotional health is as important as your physical health. If you're feeling down, or losing interest in things you would normally enjoy please talk to your healthcare provider.  Eye exam- Visit your eye doctor every year.  Safe sex- If you may be exposed to a sexually transmitted infection, use a condom.  Seat belts- Seat belts can save your life; always wear one.  Smoke/Carbon Monoxide detectors- These detectors need to be installed on the appropriate level of your home.  Replace batteries at least once a year.  Skin cancer- When out in the sun, cover up and use sunscreen 15 SPF or higher.  Violence- If anyone is threatening you, please tell your healthcare provider.  Living Will/ Health care power of attorney- Speak with your healthcare provider and family.  If you have lab work done  today you will be contacted with your lab results within the next 2 weeks.  If you have not heard from [place] then please contact us. The fastest way to get your results is to register for My Chart.   IF you received an x-ray today, you will receive an invoice from [facility]. Please contact [facility] at [phone] with questions or concerns regarding your invoice.   IF you received labwork today, you will receive an invoice from [facility]. Please contact  [facility] at [phone] with questions or concerns regarding your invoice.   Our billing staff will not be able to assist you with questions regarding bills from these companies.  You will be contacted with the lab results as soon as they are available. The fastest way to get your results is to activate your My Chart account. Instructions are located on the last page of this paperwork. If you have not heard from us regarding the results in 2 weeks, please contact this office.       Signed,   Meredith StaggersJeffrey Lucah Petta, MD [facility].  02/08/19 3:26 PM

## 2019-02-07 LAB — COMPREHENSIVE METABOLIC PANEL
ALT: 14 IU/L (ref 0–44)
AST: 17 IU/L (ref 0–40)
Albumin/Globulin Ratio: 1.4 (ref 1.2–2.2)
Albumin: 4.2 g/dL (ref 3.8–4.9)
Alkaline Phosphatase: 61 IU/L (ref 39–117)
BUN/Creatinine Ratio: 10 (ref 9–20)
BUN: 16 mg/dL (ref 6–24)
Bilirubin Total: 0.4 mg/dL (ref 0.0–1.2)
CO2: 25 mmol/L (ref 20–29)
Calcium: 9.8 mg/dL (ref 8.7–10.2)
Chloride: 102 mmol/L (ref 96–106)
Creatinine, Ser: 1.58 mg/dL — ABNORMAL HIGH (ref 0.76–1.27)
GFR calc Af Amer: 57 mL/min/{1.73_m2} — ABNORMAL LOW (ref >59)
GFR calc non Af Amer: 50 mL/min/{1.73_m2} — ABNORMAL LOW (ref >59)
Globulin, Total: 3 g/dL (ref 1.5–4.5)
Glucose: 102 mg/dL — ABNORMAL HIGH (ref 65–99)
Potassium: 4.2 mmol/L (ref 3.5–5.2)
Sodium: 139 mmol/L (ref 134–144)
Total Protein: 7.2 g/dL (ref 6.0–8.5)

## 2019-02-07 LAB — LIPID PANEL
Chol/HDL Ratio: 3 ratio (ref 0.0–5.0)
Cholesterol, Total: 146 mg/dL (ref 100–199)
HDL: 49 mg/dL (ref >39)
LDL Calculated: 76 mg/dL (ref 0–99)
Triglycerides: 105 mg/dL (ref 0–149)
VLDL Cholesterol Cal: 21 mg/dL (ref 5–40)

## 2019-02-08 ENCOUNTER — Encounter: Payer: Self-pay | Admitting: Family Medicine

## 2019-02-12 ENCOUNTER — Telehealth (INDEPENDENT_AMBULATORY_CARE_PROVIDER_SITE_OTHER): Payer: 59 | Admitting: Cardiology

## 2019-02-12 VITALS — BP 120/80 | HR 50

## 2019-02-12 DIAGNOSIS — I2510 Atherosclerotic heart disease of native coronary artery without angina pectoris: Secondary | ICD-10-CM | POA: Diagnosis not present

## 2019-02-12 DIAGNOSIS — E782 Mixed hyperlipidemia: Secondary | ICD-10-CM

## 2019-02-12 DIAGNOSIS — I10 Essential (primary) hypertension: Secondary | ICD-10-CM

## 2019-02-12 NOTE — Progress Notes (Signed)
Patient referred by Wendie Agreste, MD for chest pain  Subjective:   Raymond Green, male    DOB: 1965-08-13, 53 y.o.   MRN: [id number]  I connected with the patient on 02/12/2019 by a video enabled telemedicine application and verified that I am speaking with the correct person using two identifiers.     I discussed the limitations of evaluation and management by telemedicine and the availability of in person appointments. The patient expressed understanding and agreed to proceed.   This visit type was conducted due to national recommendations for restrictions regarding the COVID-19 Pandemic (e.g. social distancing).  This format is felt to be most appropriate for this patient at this time.  All issues noted in this document were discussed and addressed.  No physical exam was performed (except for noted visual exam findings with Tele health visits).  The patient has consented to conduct a Tele health visit and understands insurance will be billed.   Chief Complaint  Patient presents with  . Chest Pain    HPI  53 y.o. African American male with hypertension, hyperlipidemia, family h/o CAD, referred for evaluation of chest pain.  Workup showed Echocardiogram showed EF 50-55%, grade 1 DD, mild MR, mild TR. CCTA showed non obstructive CAD with 1-24% calcified plaque in proximal LAD.  He has not had any recurrence of chest pain since last visit.  Repeat lipid panel shows improvement in LDL.   Past Medical History:  Diagnosis Date  . Allergy   . Anemia   . Erosive gastritis 2016  . GERD (gastroesophageal reflux disease)   . Headache(784.0)   . Hemorrhoids   . Hyperlipidemia   . Hypertension   . Kidney insufficiency   . LVH (left ventricular hypertrophy)    Dr. Wynonia Lawman ( cardiology)  . Obesity   . OSA (obstructive sleep apnea)   . Positive PPD, treated 2000   INH  . Sleep apnea    uses cpap  . Vitamin D deficiency      Past Surgical History:  Procedure Laterality  Date  . COLONOSCOPY  12/28/2011   Procedure: COLONOSCOPY;  Surgeon: Milus Banister, MD;  Location: [facility];  Service: Endoscopy;  Laterality: N/A;  . HEMORRHOID SURGERY    . UPPER GASTROINTESTINAL ENDOSCOPY       Social History   Socioeconomic History  . Marital status: Married    Spouse name: Not on file  . Number of children: 0  . Years of education: Not on file  . Highest education level: Not on file  Occupational History  . Occupation: ENDOSCOPY Environmental health practitioner: [employer]  Social Needs  . Financial resource strain: Not on file  . Food insecurity    Worry: Not on file    Inability: Not on file  . Transportation needs    Medical: Not on file    Non-medical: Not on file  Tobacco Use  . Smoking status: Never Smoker  . Smokeless tobacco: Never Used  Substance and Sexual Activity  . Alcohol use: No    Alcohol/week: 0.0 standard drinks  . Drug use: No  . Sexual activity: Yes  Lifestyle  . Physical activity    Days per week: Not on file    Minutes per session: Not on file  . Stress: Not on file  Relationships  . Social Herbalist on phone: Not on file    Gets together: Not on file    Attends religious  service: Not on file    Active member of club or organization: Not on file    Attends meetings of clubs or organizations: Not on file    Relationship status: Not on file  . Intimate partner violence    Fear of current or ex partner: Not on file    Emotionally abused: Not on file    Physically abused: Not on file    Forced sexual activity: Not on file  Other Topics Concern  . Not on file  Social History Narrative   Married   Education: College   Exercise: Yes     Family History  Problem Relation Age of Onset  . Diabetes Mother   . Heart disease Mother   . Hypertension Mother   . Hypertension Father   . Diabetes Sister   . Colon cancer Neg Hx   . Colon polyps Neg Hx   . Esophageal cancer Neg Hx   . Rectal cancer Neg Hx   .  Stomach cancer Neg Hx      Current Outpatient Medications on File Prior to Visit  Medication Sig Dispense Refill  . allopurinol (ZYLOPRIM) 100 MG tablet Take 1 tablet (100 mg total) by mouth daily. 90 tablet 3  . amLODipine (NORVASC) 5 MG tablet Take 1 tablet (5 mg total) by mouth daily. 90 tablet 1  . atorvastatin (LIPITOR) 80 MG tablet Take 1 tablet (80 mg total) by mouth daily. 90 tablet 3  . colchicine 0.6 MG tablet Take 2 tabs by mouth at onset of flair, then 1 tab 1 hour later if needed. 6 tablet 1  . losartan (COZAAR) 100 MG tablet Take 1 tablet (100 mg total) by mouth daily. 90 tablet 1  . Nebivolol HCl (BYSTOLIC) 20 MG TABS Take 1 tablet (20 mg total) by mouth daily. 90 tablet 1  . omeprazole (PRILOSEC) 40 MG capsule Take 1 capsule (40 mg total) by mouth daily. 90 capsule 0  . tamsulosin (FLOMAX) 0.4 MG CAPS capsule daily.     Current Facility-Administered Medications on File Prior to Visit  Medication Dose Route Frequency Provider Last Rate Last Dose  . 0.9 %  sodium chloride infusion  500 mL Intravenous Once [name], MD        Cardiovascular studies:  CCTA 01/30/2019: 1. Mild aortic root enlargement 4.1 cm 2.  Calcium score 1 which is 57 th percentile for age and sex 105. CAD RADS 1 non obstructive CAD with 1-24% calcified plaque in proximal LAD  Echocardiogram 01/22/2019: Left ventricle cavity is normal in size. Moderate concentric hypertrophy of the left ventricle. Normal LV systolic function with visual EF 50-55%. Normal global wall motion. Doppler evidence of grade I (impaired) diastolic dysfunction, normal LAP. Left atrial cavity is normal in size. Aneurysmal interatrial septum without 2D or color Doppler evidence of interatrial shunt. Mild (Grade I) mitral regurgitation. Mild tricuspid regurgitation. No evidence of pulmonary hypertension.  EKG 01/13/2019: Sinus bradycardia 47 bpm Left ventricular hypertrophy Inferolateral ST-T changes likely secondary to LVH     Recent labs: Results for NESSIAH, PHA (MRN [id number]) as of 02/12/2019 15:57  Ref. Range 02/06/2019 16:26  Sodium Latest Ref Range: 134 - 144 mmol/L 139  Potassium Latest Ref Range: 3.5 - 5.2 mmol/L 4.2  Chloride Latest Ref Range: 96 - 106 mmol/L 102  CO2 Latest Ref Range: 20 - 29 mmol/L 25  Glucose Latest Ref Range: 65 - 99 mg/dL 159 (H)  BUN Latest Ref Range: 6 - 24 mg/dL 16  Creatinine Latest Ref Range: 0.76 - 1.27 mg/dL 4.091.58 (H)  Calcium Latest Ref Range: 8.7 - 10.2 mg/dL 9.8  BUN/Creatinine Ratio Latest Ref Range: 9 - 20  10  Alkaline Phosphatase Latest Ref Range: 39 - 117 IU/L 61  Albumin Latest Ref Range: 3.8 - 4.9 g/dL 4.2  Albumin/Globulin Ratio Latest Ref Range: 1.2 - 2.2  1.4  AST Latest Ref Range: 0 - 40 IU/L 17  ALT Latest Ref Range: 0 - 44 IU/L 14  Total Protein Latest Ref Range: 6.0 - 8.5 g/dL 7.2  Total Bilirubin Latest Ref Range: 0.0 - 1.2 mg/dL 0.4  GFR, Est Non African American Latest Ref Range: >59 mL/min/1.73 50 (L)  GFR, Est African American Latest Ref Range: >59 mL/min/1.73 57 (L)  Total CHOL/HDL Ratio Latest Ref Range: 0.0 - 5.0 ratio 3.0  Cholesterol, Total Latest Ref Range: 100 - 199 mg/dL 811146  HDL Cholesterol Latest Ref Range: >39 mg/dL 49  LDL (calc) Latest Ref Range: 0 - 99 mg/dL 76  Triglycerides Latest Ref Range: 0 - 149 mg/dL 914105  VLDL Cholesterol Cal Latest Ref Range: 5 - 40 mg/dL 21    Results for Trish FountainWAKA, Revis A (MRN [id number]) as of 01/13/2019 13:20  Ref. Range 12/11/2017 17:08 07/23/2018 17:37  COMPREHENSIVE METABOLIC PANEL Unknown Rpt (A) Rpt (A)  Sodium Latest Ref Range: 134 - 144 mmol/L 140 141  Potassium Latest Ref Range: 3.5 - 5.2 mmol/L 4.2 4.2  Chloride Latest Ref Range: 96 - 106 mmol/L 102 102  CO2 Latest Ref Range: 20 - 29 mmol/L 25 23  Glucose Latest Ref Range: 65 - 99 mg/dL 80 84  BUN Latest Ref Range: 6 - 24 mg/dL 13 15  Creatinine Latest Ref Range: 0.76 - 1.27 mg/dL 0.861.43 (H) 5.781.31 (H)  Calcium Latest Ref Range: 8.7 -  10.2 mg/dL 9.9 9.8  BUN/Creatinine Ratio Latest Ref Range: 9 - 20  9 11   Alkaline Phosphatase Latest Ref Range: 39 - 117 IU/L 61 62  Albumin Latest Ref Range: 3.8 - 4.9 g/dL 4.3 4.1  Albumin/Globulin Ratio Latest Ref Range: 1.2 - 2.2  1.4 1.3  AST Latest Ref Range: 0 - 40 IU/L 23 21  ALT Latest Ref Range: 0 - 44 IU/L 17 18  Total Protein Latest Ref Range: 6.0 - 8.5 g/dL 7.3 7.3  Total Bilirubin Latest Ref Range: 0.0 - 1.2 mg/dL 0.5 0.5  GFR, Est Non African American Latest Ref Range: >59 mL/min/1.73 56 (L) 62  GFR, Est African American Latest Ref Range: >59 mL/min/1.73 65 72  Total CHOL/HDL Ratio Latest Ref Range: 0.0 - 5.0 ratio 4.3   Cholesterol, Total Latest Ref Range: 100 - 199 mg/dL 469213 (H)   HDL Cholesterol Latest Ref Range: >39 mg/dL 50   LDL (calc) Latest Ref Range: 0 - 99 mg/dL 629137 (H)   Triglycerides Latest Ref Range: 0 - 149 mg/dL 528130   VLDL Cholesterol Cal Latest Ref Range: 5 - 40 mg/dL 26    Results for Trish FountainWAKA, Cortlin A (MRN [id number]) as of 01/13/2019 13:20  Ref. Range 06/03/2018 16:11  WBC Latest Ref Range: 4.0 - 10.5 K/uL 5.5  RBC Latest Ref Range: 4.22 - 5.81 Mil/uL 4.48  Hemoglobin Latest Ref Range: 13.0 - 17.0 g/dL 27.213.5  HCT Latest Ref Range: 39.0 - 52.0 % 39.8  MCV Latest Ref Range: 78.0 - 100.0 fl 88.9  MCHC Latest Ref Range: 30.0 - 36.0 g/dL 53.633.9  RDW Latest Ref Range: 11.5 - 15.5 % 13.7  Platelets Latest Ref  Range: 150.0 - 400.0 K/uL 167.0    Review of Systems  Constitution: Negative for decreased appetite, malaise/fatigue, weight gain and weight loss.  HENT: Negative for congestion.   Eyes: Negative for visual disturbance.  Cardiovascular: Positive for chest pain. Negative for dyspnea on exertion, leg swelling, palpitations and syncope.  Respiratory: Negative for cough.   Endocrine: Negative for cold intolerance.  Hematologic/Lymphatic: Does not bruise/bleed easily.  Skin: Negative for itching and rash.  Musculoskeletal: Negative for myalgias.   Gastrointestinal: Positive for bloating. Negative for abdominal pain, nausea and vomiting.  Genitourinary: Negative for dysuria.  Neurological: Negative for dizziness and weakness.  Psychiatric/Behavioral: The patient is not nervous/anxious.   All other systems reviewed and are negative.       Vitals:   02/12/19 1554  BP: 120/80  Pulse: (!) 50     Objective:   Physical Exam  Constitutional: He is oriented to person, place, and time. He appears well-developed and well-nourished. No distress.  Pulmonary/Chest: Effort normal.  Neurological: He is alert and oriented to person, place, and time.  Psychiatric: He has a normal mood and affect.  Nursing note and vitals reviewed.       Assessment & Recommendations:   53 y.o. African American male with hypertension, hyperlipidemia, family h/o CAD, referred for evaluation of chest pain.  Chest pain: Resolved.  Coronary CT angiogram shows mild nonobstructive coronary artery disease.  Continue risk factor modification including management of hypertension and hyperlipidemia.  Continue Lipitor..  He has prior history of gastric erosion.  Therefore, I do not recommend aspirin at this time.  I will see him on as needed basis.   Elder NegusManish J Winfrey Chillemi, MD [facility] Pager: [phone] Office: [phone] If no answer Cell [phone]

## 2019-02-13 ENCOUNTER — Telehealth: Payer: 59 | Admitting: Cardiology

## 2019-02-19 DIAGNOSIS — N3941 Urge incontinence: Secondary | ICD-10-CM | POA: Diagnosis not present

## 2019-02-19 DIAGNOSIS — R351 Nocturia: Secondary | ICD-10-CM | POA: Diagnosis not present

## 2019-02-19 DIAGNOSIS — N401 Enlarged prostate with lower urinary tract symptoms: Secondary | ICD-10-CM | POA: Diagnosis not present

## 2019-02-19 DIAGNOSIS — R3912 Poor urinary stream: Secondary | ICD-10-CM | POA: Diagnosis not present

## 2019-02-19 DIAGNOSIS — R3915 Urgency of urination: Secondary | ICD-10-CM | POA: Diagnosis not present

## 2019-03-31 MED FILL — LOSARTAN POTASSIUM 100 MG T: 100 | 30 days supply | Qty: 30 | Fill #0

## 2019-05-21 MED FILL — ATORVASTATIN 40 MG TABLET: 40 | 90 days supply | Qty: 90 | Fill #2

## 2019-05-21 MED FILL — TAMSULOSIN HCL 0.4 MG CAP: 0.4 | 90 days supply | Qty: 90 | Fill #1

## 2019-05-21 MED FILL — AMLODIPINE BESYLATE 10 MG T: 10 | 90 days supply | Qty: 90 | Fill #1

## 2019-05-21 MED FILL — LOSARTAN POTASSIUM 100 MG T: 100 | 90 days supply | Qty: 90 | Fill #2

## 2019-06-02 DIAGNOSIS — I10 Essential (primary) hypertension: Secondary | ICD-10-CM | POA: Diagnosis not present

## 2019-06-02 DIAGNOSIS — N182 Chronic kidney disease, stage 2 (mild): Secondary | ICD-10-CM | POA: Diagnosis not present

## 2019-06-02 DIAGNOSIS — K279 Peptic ulcer, site unspecified, unspecified as acute or chronic, without hemorrhage or perforation: Secondary | ICD-10-CM | POA: Diagnosis not present

## 2019-06-02 DIAGNOSIS — E785 Hyperlipidemia, unspecified: Secondary | ICD-10-CM | POA: Diagnosis not present

## 2019-07-11 ENCOUNTER — Other Ambulatory Visit: Payer: Self-pay | Admitting: Family Medicine

## 2019-07-11 DIAGNOSIS — K219 Gastro-esophageal reflux disease without esophagitis: Secondary | ICD-10-CM

## 2019-07-11 MED FILL — OMEPRAZOLE 40 MG CPDR: 40 | 90 days supply | Qty: 90 | Fill #0

## 2019-07-11 MED FILL — BYSTOLIC 20 MG TABLET: 20 | 90 days supply | Qty: 90 | Fill #1

## 2019-08-04 MED FILL — OMEPRAZOLE 40 MG CPDR: 40 | 90 days supply | Qty: 90 | Fill #0

## 2019-08-04 MED FILL — BYSTOLIC 20 MG TABLET: 20 | 90 days supply | Qty: 90 | Fill #1

## 2019-08-05 MED FILL — ALLOPURINOL 100 MG TABS: 100 | 90 days supply | Qty: 90 | Fill #1

## 2019-08-08 ENCOUNTER — Other Ambulatory Visit: Payer: Self-pay

## 2019-08-08 ENCOUNTER — Telehealth: Payer: Self-pay | Admitting: Family Medicine

## 2019-08-20 ENCOUNTER — Ambulatory Visit (INDEPENDENT_AMBULATORY_CARE_PROVIDER_SITE_OTHER): Payer: No Typology Code available for payment source | Admitting: Family Medicine

## 2019-08-20 ENCOUNTER — Other Ambulatory Visit: Payer: Self-pay

## 2019-08-20 ENCOUNTER — Encounter: Payer: Self-pay | Admitting: Family Medicine

## 2019-08-20 VITALS — BP 130/80 | HR 46 | Temp 98.1°F | Ht 72.0 in | Wt 251.0 lb

## 2019-08-20 DIAGNOSIS — R0789 Other chest pain: Secondary | ICD-10-CM

## 2019-08-20 DIAGNOSIS — K219 Gastro-esophageal reflux disease without esophagitis: Secondary | ICD-10-CM

## 2019-08-20 DIAGNOSIS — E785 Hyperlipidemia, unspecified: Secondary | ICD-10-CM | POA: Diagnosis not present

## 2019-08-20 DIAGNOSIS — I10 Essential (primary) hypertension: Secondary | ICD-10-CM | POA: Diagnosis not present

## 2019-08-20 MED ORDER — AMLODIPINE BESYLATE 5 MG PO TABS: 5.0000 mg | ORAL_TABLET | Freq: Every day | ORAL | 1 refills | Status: DC

## 2019-08-20 MED ORDER — TRAMADOL HCL 50 MG PO TABS: 50.0000 mg | ORAL_TABLET | Freq: Three times a day (TID) | ORAL | 0 refills | Status: AC | PRN

## 2019-08-20 MED ORDER — BYSTOLIC 20 MG PO TABS: 1.0000 | ORAL_TABLET | Freq: Every day | ORAL | 1 refills | Status: DC

## 2019-08-20 MED ORDER — LOSARTAN POTASSIUM 100 MG PO TABS: 100.0000 mg | ORAL_TABLET | Freq: Every day | ORAL | 1 refills | Status: DC

## 2019-08-20 MED ORDER — OMEPRAZOLE 40 MG PO CPDR: 40.0000 mg | DELAYED_RELEASE_CAPSULE | Freq: Every day | ORAL | 1 refills | Status: DC

## 2019-08-20 MED FILL — LOSARTAN POTASSIUM 100 MG T: 100 | 90 days supply | Qty: 90 | Fill #0

## 2019-08-20 MED FILL — traMADol HCL 50 MG TABS: 50 | 5 days supply | Qty: 15 | Fill #0

## 2019-08-20 MED FILL — AMLODIPINE BESYLATE 5 MG TA: 5 | 90 days supply | Qty: 90 | Fill #0

## 2019-08-20 NOTE — Patient Instructions (Addendum)
Tylenol for now for chest wall pain.  See other information below.  If that pain is worse at night or you need something stronger, I did prescribe a few tramadol if needed.  Do not drive or operate heavy machinery when taking that medication.  If the chest wall pain is not improving in the next 1 to 2 weeks or any worsening sooner, please return for recheck.  Return to the clinic or go to the nearest emergency room if any of your symptoms worsen or new symptoms occur.    Chest Wall Pain Chest wall pain is pain in or around the bones and muscles of your chest. Sometimes, an injury causes this pain. Excessive coughing or overuse of arm and chest muscles may also cause chest wall pain. Sometimes, the cause may not be known. This pain may take several weeks or longer to get better. Follow these instructions at home: Managing pain, stiffness, and swelling   If directed, put ice on the painful area: ? Put ice in a plastic bag. ? Place a towel between your skin and the bag. ? Leave the ice on for 20 minutes, 2-3 times per day. Activity  Rest as told by your health care provider.  Avoid activities that cause pain. These include any activities that use your chest muscles or your abdominal and side muscles to lift heavy items. Ask your health care provider what activities are safe for you. General instructions   Take over-the-counter and prescription medicines only as told by your health care provider.  Do not use any products that contain nicotine or tobacco, such as cigarettes, e-cigarettes, and chewing tobacco. These can delay healing after injury. If you need help quitting, ask your health care provider.  Keep all follow-up visits as told by your health care provider. This is important. Contact a health care provider if:  You have a fever.  Your chest pain becomes worse.  You have new symptoms. Get help right away if:  You have nausea or vomiting.  You feel sweaty or  light-headed.  You have a cough with mucus from your lungs (sputum) or you cough up blood.  You develop shortness of breath. These symptoms may represent a serious problem that is an emergency. Do not wait to see if the symptoms will go away. Get medical help right away. Call your local emergency services (911 in the [place] Do not drive yourself to the hospital. Summary  Chest wall pain is pain in or around the bones and muscles of your chest.  Depending on the cause, it may be treated with ice, rest, medicines, and avoiding activities that cause pain.  Contact a health care provider if you have a fever, worsening chest pain, or new symptoms.  Get help right away if you feel light-headed or you develop shortness of breath. These symptoms may be an emergency. This information is not intended to replace advice given to you by your health care provider. Make sure you discuss any questions you have with your health care provider. Document Revised: 12/06/2017 Document Reviewed: 12/06/2017 Elsevier Patient Education  [employer].    If you have lab work done today you will be contacted with your lab results within the next 2 weeks.  If you have not heard from [place] then please contact us. The fastest way to get your results is to register for My Chart.   IF you received an x-ray today, you will receive an invoice from [facility]. Please contact [facility] at [phone] with questions or concerns regarding your invoice.   IF you received labwork today, you will receive an invoice from [facility]. Please contact [facility] at [phone] with questions or concerns regarding your invoice.   Our billing staff will not be able to assist you with questions regarding bills from these companies.  You will be contacted with the lab results as soon as they are available. The fastest way to get your results is to activate your My Chart account. Instructions are located on the last  page of this paperwork. If you have not heard from [place] regarding the results in 2 weeks, please contact this office.

## 2019-08-20 NOTE — Progress Notes (Signed)
Subjective:  Patient ID: Raymond Green, male    DOB: [birth year]  Age: 54 y.o. MRN: [id number]  CC:  Chief Complaint  Patient presents with  . Back Pain    pt has pain sin his upper back on his R side. pain radiates under pts R arm and into chest. pt states when he takes a deep breath he feels a sharp pain under his R arm and under pectoral mussel  . Follow-up    on hypertension. pt checks his BP every other day pt reports his BP stays around 140/90. some times above some times below. pt states he feels a little fatigued.    HPI DEVAUGHN SAVANT presents for   Hypertension: With associated CKD.  Avoiding nsaids, staying hydrated.  No missed med doses.  Bystolic, losartan, amlodipine. No new side effects.  Home readings: 130-140/80-90 BP Readings from Last 3 Encounters:  08/20/19 130/80  02/12/19 120/80  02/06/19 123/72   Lab Results  Component Value Date   CREATININE 1.58 (H) 02/06/2019   Gout: Last flare:no recent flare - over a year ago.  Daily meds allopurinol 100mg  qd.  Prn med: colchicine.  Lab Results  Component Value Date   LABURIC 6.7 11/22/2017   GERD: GI: Dr 01/22/2018.  Omeprazole 40mg  QD - controlling sx's.   Hyperlipidemia: lipitor 80mg  QD, no new myalgias.  Lab Results  Component Value Date   CHOL 146 02/06/2019   HDL 49 02/06/2019   LDLCALC 76 02/06/2019   TRIG 105 02/06/2019   CHOLHDL 3.0 02/06/2019   Lab Results  Component Value Date   ALT 14 02/06/2019   AST 17 02/06/2019   ALKPHOS 61 02/06/2019   BILITOT 0.4 02/06/2019   Back pain/chest wall pain. Past 2 weeks - R sided pleuritic pain with deep breath in. No dyspnea.  Taking stairs a few weeks ago - slight shortness of breath with stairs, none since, no other chest pains.  No recent prolonged car travel or air travel, no recent calf pain or swelling.no history of DVT. No calf pain/swelling.  No cough.  No rash.  Did cut some wood about 2 weeks ago when power went out. More  twisting, lifting. Pain started few days later. No injury.  Pain about the same. Pain with deep breath.  Endoscopy with gastritis and erosions in Jan 2020.   Tx; none.    History Patient Active Problem List   Diagnosis Date Noted  . Mild coronary artery disease 02/12/2019  . Mixed hyperlipidemia 01/14/2019  . Chest pain 01/13/2019  . Palpitations 01/13/2019  . Low back pain 07/13/2016  . Abdominal pain, left lower quadrant 05/17/2016  . Elevated serum creatinine 05/29/2015  . Gout 10/29/2013  . Essential hypertension 03/02/2008  . GERD 03/02/2008  . Iron deficiency anemia 12/13/2007  . HEMORRHOIDS-EXTERNAL 12/13/2007   Past Medical History:  Diagnosis Date  . Allergy   . Anemia   . Erosive gastritis 2016  . GERD (gastroesophageal reflux disease)   . Headache(784.0)   . Hemorrhoids   . Hyperlipidemia   . Hypertension   . Kidney insufficiency   . LVH (left ventricular hypertrophy)    Dr. 12/15/2007 ( cardiology)  . Obesity   . OSA (obstructive sleep apnea)   . Positive PPD, treated 2000   INH  . Sleep apnea    uses cpap  . Vitamin D deficiency    Past Surgical History:  Procedure Laterality Date  . COLONOSCOPY  12/28/2011   Procedure: COLONOSCOPY;  Surgeon: Milus Banister, MD;  Location: [facility];  Service: Endoscopy;  Laterality: N/A;  . HEMORRHOID SURGERY    . UPPER GASTROINTESTINAL ENDOSCOPY     Allergies  Allergen Reactions  . Lisinopril     angioedema  . Shrimp [Shellfish Allergy] Nausea And Vomiting   Prior to Admission medications   Medication Sig Start Date End Date Taking? Authorizing Provider  allopurinol (ZYLOPRIM) 100 MG tablet Take 1 tablet (100 mg total) by mouth daily. 02/06/19  Yes Wendie Agreste, MD  amLODipine (NORVASC) 5 MG tablet Take 1 tablet (5 mg total) by mouth daily. 02/06/19  Yes Wendie Agreste, MD  atorvastatin (LIPITOR) 80 MG tablet Take 1 tablet (80 mg total) by mouth daily. Patient taking differently: Take 80 mg by mouth  daily.  01/13/19  Yes Patwardhan, Manish J, MD  losartan (COZAAR) 100 MG tablet Take 1 tablet (100 mg total) by mouth daily. 02/06/19  Yes Wendie Agreste, MD  Nebivolol HCl (BYSTOLIC) 20 MG TABS Take 1 tablet (20 mg total) by mouth daily. 02/06/19  Yes Wendie Agreste, MD  omeprazole (PRILOSEC) 40 MG capsule TAKE 1 CAPSULE BY MOUTH ONCE DAILY 07/11/19  Yes Wendie Agreste, MD  tamsulosin (FLOMAX) 0.4 MG CAPS capsule daily. 01/02/19  Yes [provider]  colchicine 0.6 MG tablet Take 2 tabs by mouth at onset of flair, then 1 tab 1 hour later if needed. Patient not taking: Reported on 08/20/2019 02/06/19   Wendie Agreste, MD   Social History   Socioeconomic History  . Marital status: Married    Spouse name: Not on file  . Number of children: 0  . Years of education: Not on file  . Highest education level: Not on file  Occupational History  . Occupation: ENDOSCOPY TECHNICIAN    Employer: [employer]  Tobacco Use  . Smoking status: Never Smoker  . Smokeless tobacco: Never Used  Substance and Sexual Activity  . Alcohol use: No    Alcohol/week: 0.0 standard drinks  . Drug use: No  . Sexual activity: Yes  Other Topics Concern  . Not on file  Social History Narrative   Married   Education: Secretary/administrator   Exercise: Yes   Social Determinants of Health   Financial Resource Strain:   . Difficulty of Paying Living Expenses: Not on file  Food Insecurity:   . Worried About Charity fundraiser in the Last Year: Not on file  . Ran Out of Food in the Last Year: Not on file  Transportation Needs:   . Lack of Transportation (Medical): Not on file  . Lack of Transportation (Non-Medical): Not on file  Physical Activity:   . Days of Exercise per Week: Not on file  . Minutes of Exercise per Session: Not on file  Stress:   . Feeling of Stress : Not on file  Social Connections:   . Frequency of Communication with Friends and Family: Not on file  . Frequency of Social Gatherings with  Friends and Family: Not on file  . Attends Religious Services: Not on file  . Active Member of Clubs or Organizations: Not on file  . Attends Archivist Meetings: Not on file  . Marital Status: Not on file  Intimate Partner Violence:   . Fear of Current or Ex-Partner: Not on file  . Emotionally Abused: Not on file  . Physically Abused: Not on file  . Sexually Abused: Not on file    Review of  Systems  Constitutional: Negative for fatigue and unexpected weight change.  Eyes: Negative for visual disturbance.  Respiratory: Positive for chest tightness. Negative for cough and shortness of breath.   Cardiovascular: Positive for chest pain (R sided. ). Negative for palpitations and leg swelling.  Gastrointestinal: Negative for abdominal pain and blood in stool.  Neurological: Negative for dizziness, light-headedness and headaches.     Objective:   Vitals:   08/20/19 1542 08/20/19 1548  BP: (!) 144/69 130/80  Pulse: (!) 46   Temp: 98.1 F (36.7 C)   TempSrc: Temporal   SpO2: 100%   Weight: 251 lb (113.9 kg)   Height: 6' (1.829 m)      Physical Exam Vitals reviewed.  Constitutional:      Appearance: He is well-developed.  HENT:     Head: Normocephalic and atraumatic.  Eyes:     Pupils: Pupils are equal, round, and reactive to light.  Neck:     Vascular: No carotid bruit or JVD.  Cardiovascular:     Rate and Rhythm: Normal rate and regular rhythm.     Heart sounds: Normal heart sounds. No murmur.  Pulmonary:     Effort: Pulmonary effort is normal.     Breath sounds: Normal breath sounds. No rales.     Comments: Lungs clear in all lung fields with equal breath sounds right more than left.  Normal effort, no distress, speaking in full sentences. Chest:     Chest wall: Tenderness present. No deformity, swelling, crepitus or edema.    Skin:    General: Skin is warm and dry.  Neurological:     Mental Status: He is alert and oriented to person, place, and time.          Assessment & Plan:  ELL TISO is a 54 y.o. male . Right-sided chest wall pain - Plan: traMADol (ULTRAM) 50 MG tablet  -Reproducible on exam, chest wall pain without known injury.  Onset day after working outside, possible chest wall strain/overuse/intercostal strain  -Tylenol as needed, range of motion, symptomatic care, tramadol provided short-term if needed for more significant pain with potential side effects discussed.  Recheck next 1 to 2 weeks if not improving, sooner if worse  Hyperlipidemia, unspecified hyperlipidemia type - Plan: Lipid panel  -Tolerating Lipitor, continue same, check lipids  Essential hypertension - Plan: Lipid panel, Comprehensive metabolic panel, losartan (COZAAR) 100 MG tablet Benign essential HTN - Plan: amLODipine (NORVASC) 5 MG tablet, Nebivolol HCl (BYSTOLIC) 20 MG TABS  -Stable, continue losartan, Bystolic, amlodipine same dose  Gastroesophageal reflux disease - Plan: omeprazole (PRILOSEC) 40 MG capsule  -Trigger avoidance, continue omeprazole 40 mg daily.  Avoid NSAIDs for chest wall pain given prior gastric erosions/gastritis.  No orders of the defined types were placed in this encounter.  Patient Instructions    Tylenol for now for chest wall pain.  See other information below.  If that pain is worse at night or you need something stronger, I did prescribe a few tramadol if needed.  Do not drive or operate heavy machinery when taking that medication.  If the chest wall pain is not improving in the next 1 to 2 weeks or any worsening sooner, please return for recheck.  Return to the clinic or go to the nearest emergency room if any of your symptoms worsen or new symptoms occur.    Chest Wall Pain Chest wall pain is pain in or around the bones and muscles of your chest. Sometimes, an injury  causes this pain. Excessive coughing or overuse of arm and chest muscles may also cause chest wall pain. Sometimes, the cause may not be known.  This pain may take several weeks or longer to get better. Follow these instructions at home: Managing pain, stiffness, and swelling   If directed, put ice on the painful area: ? Put ice in a plastic bag. ? Place a towel between your skin and the bag. ? Leave the ice on for 20 minutes, 2-3 times per day. Activity  Rest as told by your health care provider.  Avoid activities that cause pain. These include any activities that use your chest muscles or your abdominal and side muscles to lift heavy items. Ask your health care provider what activities are safe for you. General instructions   Take over-the-counter and prescription medicines only as told by your health care provider.  Do not use any products that contain nicotine or tobacco, such as cigarettes, e-cigarettes, and chewing tobacco. These can delay healing after injury. If you need help quitting, ask your health care provider.  Keep all follow-up visits as told by your health care provider. This is important. Contact a health care provider if:  You have a fever.  Your chest pain becomes worse.  You have new symptoms. Get help right away if:  You have nausea or vomiting.  You feel sweaty or light-headed.  You have a cough with mucus from your lungs (sputum) or you cough up blood.  You develop shortness of breath. These symptoms may represent a serious problem that is an emergency. Do not wait to see if the symptoms will go away. Get medical help right away. Call your local emergency services (911 in the [place] Do not drive yourself to the hospital. Summary  Chest wall pain is pain in or around the bones and muscles of your chest.  Depending on the cause, it may be treated with ice, rest, medicines, and avoiding activities that cause pain.  Contact a health care provider if you have a fever, worsening chest pain, or new symptoms.  Get help right away if you feel light-headed or you develop shortness of breath. These  symptoms may be an emergency. This information is not intended to replace advice given to you by your health care provider. Make sure you discuss any questions you have with your health care provider. Document Revised: 12/06/2017 Document Reviewed: 12/06/2017 Elsevier Patient Education  [employer].    If you have lab work done today you will be contacted with your lab results within the next 2 weeks.  If you have not heard from [place] then please contact us. The fastest way to get your results is to register for My Chart.   IF you received an x-ray today, you will receive an invoice from [facility]. Please contact [facility] at [phone] with questions or concerns regarding your invoice.   IF you received labwork today, you will receive an invoice from [facility]. Please contact [facility] at [phone] with questions or concerns regarding your invoice.   Our billing staff will not be able to assist you with questions regarding bills from these companies.  You will be contacted with the lab results as soon as they are available. The fastest way to get your results is to activate your My Chart account. Instructions are located on the last page of this paperwork. If you have not heard from [place] regarding the results in 2 weeks, please contact this office.  Signed, Merri Ray, MD [facility]

## 2019-08-21 ENCOUNTER — Encounter: Payer: Self-pay | Admitting: Family Medicine

## 2019-08-21 LAB — LIPID PANEL
Chol/HDL Ratio: 2.8 ratio (ref 0.0–5.0)
Cholesterol, Total: 149 mg/dL (ref 100–199)
HDL: 54 mg/dL (ref >39)
LDL Chol Calc (NIH): 77 mg/dL (ref 0–99)
Triglycerides: 99 mg/dL (ref 0–149)
VLDL Cholesterol Cal: 18 mg/dL (ref 5–40)

## 2019-08-21 LAB — COMPREHENSIVE METABOLIC PANEL
ALT: 14 IU/L (ref 0–44)
AST: 20 IU/L (ref 0–40)
Albumin/Globulin Ratio: 1.6 (ref 1.2–2.2)
Albumin: 4.5 g/dL (ref 3.8–4.9)
Alkaline Phosphatase: 69 IU/L (ref 39–117)
BUN/Creatinine Ratio: 12 (ref 9–20)
BUN: 17 mg/dL (ref 6–24)
Bilirubin Total: 0.4 mg/dL (ref 0.0–1.2)
CO2: 23 mmol/L (ref 20–29)
Calcium: 9.6 mg/dL (ref 8.7–10.2)
Chloride: 106 mmol/L (ref 96–106)
Creatinine, Ser: 1.39 mg/dL — ABNORMAL HIGH (ref 0.76–1.27)
GFR calc Af Amer: 66 mL/min/{1.73_m2} (ref >59)
GFR calc non Af Amer: 57 mL/min/{1.73_m2} — ABNORMAL LOW (ref >59)
Globulin, Total: 2.8 g/dL (ref 1.5–4.5)
Glucose: 85 mg/dL (ref 65–99)
Potassium: 4.6 mmol/L (ref 3.5–5.2)
Sodium: 143 mmol/L (ref 134–144)
Total Protein: 7.3 g/dL (ref 6.0–8.5)

## 2019-10-09 ENCOUNTER — Other Ambulatory Visit: Payer: Self-pay | Admitting: Family Medicine

## 2019-10-09 MED FILL — TAMSULOSIN HCL 0.4 MG CAP: 0.4 | 30 days supply | Qty: 30 | Fill #2

## 2019-10-10 ENCOUNTER — Other Ambulatory Visit: Payer: Self-pay | Admitting: Family Medicine

## 2019-10-10 MED FILL — ATORVASTATIN 40 MG TABLET: 40 | 90 days supply | Qty: 90 | Fill #0

## 2019-10-13 ENCOUNTER — Other Ambulatory Visit (HOSPITAL_COMMUNITY): Payer: Self-pay | Admitting: Urology

## 2019-12-09 MED FILL — BYSTOLIC 20 MG TABLET: 20 | 90 days supply | Qty: 90 | Fill #0

## 2019-12-09 MED FILL — OMEPRAZOLE 40 MG CPDR: 40 | 90 days supply | Qty: 90 | Fill #0

## 2020-01-14 ENCOUNTER — Ambulatory Visit: Payer: Self-pay | Attending: Internal Medicine

## 2020-01-14 DIAGNOSIS — Z20822 Contact with and (suspected) exposure to covid-19: Secondary | ICD-10-CM

## 2020-01-15 LAB — NOVEL CORONAVIRUS, NAA: SARS-CoV-2, NAA: NOT DETECTED

## 2020-01-15 LAB — SARS-COV-2, NAA 2 DAY TAT

## 2020-03-05 ENCOUNTER — Ambulatory Visit (INDEPENDENT_AMBULATORY_CARE_PROVIDER_SITE_OTHER): Payer: No Typology Code available for payment source | Admitting: Registered Nurse

## 2020-03-05 ENCOUNTER — Encounter: Payer: Self-pay | Admitting: Registered Nurse

## 2020-03-05 ENCOUNTER — Other Ambulatory Visit: Payer: Self-pay

## 2020-03-05 VITALS — BP 143/78 | HR 46 | Temp 97.9°F | Resp 18 | Ht 72.0 in | Wt 245.8 lb

## 2020-03-05 DIAGNOSIS — H547 Unspecified visual loss: Secondary | ICD-10-CM

## 2020-03-05 NOTE — Progress Notes (Signed)
Established Patient Office Visit  Subjective:  Patient ID: Raymond Green, male    DOB: [birth year]  Age: 54 y.o. MRN: [id number]  CC:  Chief Complaint  Patient presents with  . Referral    Patient states he is here for a referral to the eye doctor. Per patient he already has the appointment just need a referral due to insurance    HPI MONTARIO Green presents for decreased visual acuity Wants to get reestablished with ophthalmology, but needs referral for insurance purposes Has an appt scheduled with [facility] in October  No acute concerns or complaints. No pain, no decrease in EOM, no headaches. Last time glasses were updated was well over 1 year ago.   Past Medical History:  Diagnosis Date  . Allergy   . Anemia   . Erosive gastritis 2016  . GERD (gastroesophageal reflux disease)   . Headache(784.0)   . Hemorrhoids   . Hyperlipidemia   . Hypertension   . Kidney insufficiency   . LVH (left ventricular hypertrophy)    Dr. Donnie Aho ( cardiology)  . Obesity   . OSA (obstructive sleep apnea)   . Positive PPD, treated 2000   INH  . Sleep apnea    uses cpap  . Vitamin D deficiency     Past Surgical History:  Procedure Laterality Date  . COLONOSCOPY  12/28/2011   Procedure: COLONOSCOPY;  Surgeon: [name], MD;  Location: [facility];  Service: Endoscopy;  Laterality: N/A;  . HEMORRHOID SURGERY    . UPPER GASTROINTESTINAL ENDOSCOPY      Family History  Problem Relation Age of Onset  . Diabetes Mother   . Heart disease Mother   . Hypertension Mother   . Hypertension Father   . Diabetes Sister   . Colon cancer Neg Hx   . Colon polyps Neg Hx   . Esophageal cancer Neg Hx   . Rectal cancer Neg Hx   . Stomach cancer Neg Hx     Social History   Socioeconomic History  . Marital status: Married    Spouse name: Not on file  . Number of children: 0  . Years of education: Not on file  . Highest education level: Not on file  Occupational History  .  Occupation: ENDOSCOPY TECHNICIAN    Employer: [employer]  Tobacco Use  . Smoking status: Never Smoker  . Smokeless tobacco: Never Used  Vaping Use  . Vaping Use: Never used  Substance and Sexual Activity  . Alcohol use: No    Alcohol/week: 0.0 standard drinks  . Drug use: No  . Sexual activity: Yes  Other Topics Concern  . Not on file  Social History Narrative   Married   Education: Automotive engineer   Exercise: Yes   Social Determinants of Health   Financial Resource Strain:   . Difficulty of Paying Living Expenses: Not on file  Food Insecurity:   . Worried About Programme researcher, broadcasting/film/video in the Last Year: Not on file  . Ran Out of Food in the Last Year: Not on file  Transportation Needs:   . Lack of Transportation (Medical): Not on file  . Lack of Transportation (Non-Medical): Not on file  Physical Activity:   . Days of Exercise per Week: Not on file  . Minutes of Exercise per Session: Not on file  Stress:   . Feeling of Stress : Not on file  Social Connections:   . Frequency of Communication with Friends and Family: Not  on file  . Frequency of Social Gatherings with Friends and Family: Not on file  . Attends Religious Services: Not on file  . Active Member of Clubs or Organizations: Not on file  . Attends Banker Meetings: Not on file  . Marital Status: Not on file  Intimate Partner Violence:   . Fear of Current or Ex-Partner: Not on file  . Emotionally Abused: Not on file  . Physically Abused: Not on file  . Sexually Abused: Not on file    Outpatient Medications Prior to Visit  Medication Sig Dispense Refill  . allopurinol (ZYLOPRIM) 100 MG tablet Take 1 tablet (100 mg total) by mouth daily. 90 tablet 3  . amLODipine (NORVASC) 5 MG tablet Take 1 tablet (5 mg total) by mouth daily. 90 tablet 1  . atorvastatin (LIPITOR) 40 MG tablet TAKE 1 TABLET BY MOUTH ONCE DAILY 90 tablet 3  . losartan (COZAAR) 100 MG tablet Take 1 tablet (100 mg total) by mouth daily. 90  tablet 1  . Nebivolol HCl (BYSTOLIC) 20 MG TABS Take 1 tablet (20 mg total) by mouth daily. 90 tablet 1  . omeprazole (PRILOSEC) 40 MG capsule Take 1 capsule (40 mg total) by mouth daily. 90 capsule 1  . atorvastatin (LIPITOR) 80 MG tablet Take 1 tablet (80 mg total) by mouth daily. (Patient not taking: Reported on 03/05/2020) 90 tablet 3  . colchicine 0.6 MG tablet Take 2 tabs by mouth at onset of flair, then 1 tab 1 hour later if needed. (Patient not taking: Reported on 08/20/2019) 6 tablet 1  . tamsulosin (FLOMAX) 0.4 MG CAPS capsule daily. (Patient not taking: Reported on 03/05/2020)     Facility-Administered Medications Prior to Visit  Medication Dose Route Frequency Provider Last Rate Last Admin  . 0.9 %  sodium chloride infusion  500 mL Intravenous Once [name], MD        Allergies  Allergen Reactions  . Lisinopril     angioedema  . Shrimp [Shellfish Allergy] Nausea And Vomiting    ROS Review of Systems  Constitutional: Negative.   HENT: Negative.   Eyes: Positive for visual disturbance.  Respiratory: Negative.   Cardiovascular: Negative.   Gastrointestinal: Negative.   Endocrine: Negative.   Genitourinary: Negative.   Musculoskeletal: Negative.   Skin: Negative.   Allergic/Immunologic: Negative.   Neurological: Negative.   Hematological: Negative.   Psychiatric/Behavioral: Negative.       Objective:    Physical Exam Constitutional:      General: He is not in acute distress.    Appearance: Normal appearance. He is normal weight. He is not ill-appearing, toxic-appearing or diaphoretic.  Eyes:     General: Lids are normal. Vision grossly intact. No allergic shiner, visual field deficit or scleral icterus.       Right eye: No foreign body, discharge or hordeolum.        Left eye: No foreign body, discharge or hordeolum.     Extraocular Movements: Extraocular movements intact.     Right eye: Normal extraocular motion and no nystagmus.     Left eye: Normal  extraocular motion and no nystagmus.     Conjunctiva/sclera: Conjunctivae normal.     Right eye: Right conjunctiva is not injected. No chemosis, exudate or hemorrhage.    Left eye: Left conjunctiva is not injected. No chemosis, exudate or hemorrhage. Cardiovascular:     Rate and Rhythm: Normal rate and regular rhythm.     Heart sounds: Normal heart sounds.  No murmur heard.  No friction rub. No gallop.   Pulmonary:     Effort: Pulmonary effort is normal. No respiratory distress.     Breath sounds: Normal breath sounds. No stridor. No wheezing, rhonchi or rales.  Chest:     Chest wall: No tenderness.  Neurological:     General: No focal deficit present.     Mental Status: He is alert and oriented to person, place, and time. Mental status is at baseline.  Psychiatric:        Mood and Affect: Mood normal.        Behavior: Behavior normal.        Thought Content: Thought content normal.        Judgment: Judgment normal.     BP (!) 143/78   Pulse (!) 46   Temp 97.9 F (36.6 C) (Temporal)   Resp 18   Ht 6' (1.829 m)   Wt 245 lb 12.8 oz (111.5 kg)   SpO2 100%   BMI 33.34 kg/m  Wt Readings from Last 3 Encounters:  03/05/20 245 lb 12.8 oz (111.5 kg)  08/20/19 251 lb (113.9 kg)  02/06/19 251 lb 9.6 oz (114.1 kg)     Health Maintenance Due  Topic Date Due  . Hepatitis C Screening  Never done    There are no preventive care reminders to display for this patient.  Lab Results  Component Value Date   TSH 1.14 05/17/2016   Lab Results  Component Value Date   WBC 5.5 06/03/2018   HGB 13.5 06/03/2018   HCT 39.8 06/03/2018   MCV 88.9 06/03/2018   PLT 167.0 06/03/2018   Lab Results  Component Value Date   NA 143 08/20/2019   K 4.6 08/20/2019   CO2 23 08/20/2019   GLUCOSE 85 08/20/2019   BUN 17 08/20/2019   CREATININE 1.39 (H) 08/20/2019   BILITOT 0.4 08/20/2019   ALKPHOS 69 08/20/2019   AST 20 08/20/2019   ALT 14 08/20/2019   PROT 7.3 08/20/2019   ALBUMIN 4.5  08/20/2019   CALCIUM 9.6 08/20/2019   Lab Results  Component Value Date   CHOL 149 08/20/2019   Lab Results  Component Value Date   HDL 54 08/20/2019   Lab Results  Component Value Date   LDLCALC 77 08/20/2019   Lab Results  Component Value Date   TRIG 99 08/20/2019   Lab Results  Component Value Date   CHOLHDL 2.8 08/20/2019   Lab Results  Component Value Date   HGBA1C 5.4 10/19/2016      Assessment & Plan:   Problem List Items Addressed This Visit    None    Visit Diagnoses    Decreased visual acuity    -  Primary   Relevant Orders   Ambulatory referral to Ophthalmology      No orders of the defined types were placed in this encounter.   Follow-up: No follow-ups on file.   PLAN  Referral placed  Follow up with Dr. Neva Seat as scheduled  Patient encouraged to call clinic with any questions, comments, or concerns.   Janeece Agee, NP

## 2020-03-05 NOTE — Patient Instructions (Signed)
° ° ° °  If you have lab work done today you will be contacted with your lab results within the next 2 weeks.  If you have not heard from us then please contact us. The fastest way to get your results is to register for My Chart. ° ° °IF you received an x-ray today, you will receive an invoice from [facility]. Please contact [facility] at [phone] with questions or concerns regarding your invoice.  ° °IF you received labwork today, you will receive an invoice from [facility]. Please contact [facility] at [phone] with questions or concerns regarding your invoice.  ° °Our billing staff will not be able to assist you with questions regarding bills from these companies. ° °You will be contacted with the lab results as soon as they are available. The fastest way to get your results is to activate your My Chart account. Instructions are located on the last page of this paperwork. If you have not heard from us regarding the results in 2 weeks, please contact this office. °  ° ° ° °

## 2020-04-06 MED FILL — LOSARTAN POTASSIUM 100 MG T: 100 | 90 days supply | Qty: 90 | Fill #1

## 2020-04-06 MED FILL — TAMSULOSIN HCL 0.4 MG CAP: 0.4 | 30 days supply | Qty: 30 | Fill #0

## 2020-04-06 MED FILL — ATORVASTATIN 40 MG TABLET: 40 | 90 days supply | Qty: 90 | Fill #1

## 2020-04-14 MED FILL — OMEPRAZOLE 40 MG CPDR: 40 | 90 days supply | Qty: 90 | Fill #1

## 2020-05-18 MED FILL — NEBIVOLOL HCL 20 MG TABS: 20 | 90 days supply | Qty: 90 | Fill #1

## 2020-05-18 MED FILL — TAMSULOSIN HCL 0.4 MG CAP: 0.4 | 30 days supply | Qty: 30 | Fill #1

## 2020-05-18 MED FILL — OMEPRAZOLE 40 MG CPDR: 40 | 90 days supply | Qty: 90 | Fill #1

## 2020-06-10 ENCOUNTER — Encounter (HOSPITAL_COMMUNITY): Payer: Self-pay

## 2020-06-10 ENCOUNTER — Emergency Department (HOSPITAL_COMMUNITY)
Admission: EM | Admit: 2020-06-10 | Discharge: 2020-06-10 | Disposition: A | Discharge status: Home / Self Care | Payer: No Typology Code available for payment source | Attending: Emergency Medicine | Admitting: Emergency Medicine

## 2020-06-10 ENCOUNTER — Other Ambulatory Visit: Payer: Self-pay

## 2020-06-10 DIAGNOSIS — Z79899 Other long term (current) drug therapy: Secondary | ICD-10-CM | POA: Insufficient documentation

## 2020-06-10 DIAGNOSIS — U071 COVID-19: Secondary | ICD-10-CM | POA: Insufficient documentation

## 2020-06-10 DIAGNOSIS — R509 Fever, unspecified: Secondary | ICD-10-CM | POA: Diagnosis present

## 2020-06-10 DIAGNOSIS — I10 Essential (primary) hypertension: Secondary | ICD-10-CM | POA: Insufficient documentation

## 2020-06-10 LAB — SARS CORONAVIRUS 2 BY RT PCR (HOSPITAL ORDER, PERFORMED IN ~~LOC~~ HOSPITAL LAB): SARS Coronavirus 2: POSITIVE — AB

## 2020-06-10 MED ORDER — SODIUM CHLORIDE 0.9 % IV SOLN: INTRAVENOUS | Status: DC | PRN

## 2020-06-10 MED ORDER — FAMOTIDINE IN NACL 20-0.9 MG/50ML-% IV SOLN: 20.0000 mg | Freq: Once | INTRAVENOUS | Status: DC | PRN

## 2020-06-10 MED ORDER — SODIUM CHLORIDE 0.9 % IV SOLN
Freq: Once | INTRAVENOUS | Status: AC
  Filled 2020-06-10: qty 700

## 2020-06-10 MED ORDER — EPINEPHRINE 0.3 MG/0.3ML IJ SOAJ: 0.3000 mg | Freq: Once | INTRAMUSCULAR | Status: DC | PRN

## 2020-06-10 MED ORDER — METHYLPREDNISOLONE SODIUM SUCC 125 MG IJ SOLR: 125.0000 mg | Freq: Once | INTRAMUSCULAR | Status: DC | PRN

## 2020-06-10 MED ORDER — ALBUTEROL SULFATE HFA 108 (90 BASE) MCG/ACT IN AERS: 2.0000 | INHALATION_SPRAY | Freq: Once | RESPIRATORY_TRACT | Status: DC | PRN

## 2020-06-10 MED ORDER — SODIUM CHLORIDE 0.9 % IV SOLN: 1200.0000 mg | Freq: Once | INTRAVENOUS | Status: DC

## 2020-06-10 MED ORDER — ACETAMINOPHEN 325 MG PO TABS
650.0000 mg | ORAL_TABLET | Freq: Once | ORAL | Status: AC | PRN
  Administered 2020-06-10: 650 mg via ORAL
  Filled 2020-06-10: qty 2

## 2020-06-10 MED ORDER — DIPHENHYDRAMINE HCL 50 MG/ML IJ SOLN: 50.0000 mg | Freq: Once | INTRAMUSCULAR | Status: DC | PRN

## 2020-06-10 NOTE — ED Provider Notes (Signed)
CHIEF COMPLAINT: Fevers, runny nose, body aches  HPI: Patient is a 54 year old male who presents to the emergency department with fevers, runny nose and body aches for the past 3 days.  Had shaking chills tonight.  No cough, chest pain or shortness of breath.  No vomiting or diarrhea.  No loss of taste or smell.  Has received 3 COVID-19 vaccinations.  No sick contacts or recent travel.  ROS: See HPI Constitutional:  fever  Eyes: no drainage  ENT: no runny nose   Cardiovascular:  no chest pain  Resp: no SOB  GI: no vomiting GU: no dysuria Integumentary: no rash  Allergy: no hives  Musculoskeletal: no leg swelling  Neurological: no slurred speech ROS otherwise negative  PAST MEDICAL HISTORY/PAST SURGICAL HISTORY:  Past Medical History:  Diagnosis Date  . Allergy   . Anemia   . Erosive gastritis 2016  . GERD (gastroesophageal reflux disease)   . Headache(784.0)   . Hemorrhoids   . Hyperlipidemia   . Hypertension   . Kidney insufficiency   . LVH (left ventricular hypertrophy)    Dr. Donnie Green ( cardiology)  . Obesity   . OSA (obstructive sleep apnea)   . Positive PPD, treated 2000   INH  . Sleep apnea    uses cpap  . Vitamin D deficiency     MEDICATIONS:  Prior to Admission medications   Medication Sig Start Date End Date Taking? Authorizing Provider  allopurinol (ZYLOPRIM) 100 MG tablet Take 1 tablet (100 mg total) by mouth daily. 02/06/19   Raymond Flood, MD  amLODipine (NORVASC) 5 MG tablet Take 1 tablet (5 mg total) by mouth daily. 08/20/19   Raymond Flood, MD  atorvastatin (LIPITOR) 40 MG tablet TAKE 1 TABLET BY MOUTH ONCE DAILY 10/10/19   Raymond Flood, MD  atorvastatin (LIPITOR) 80 MG tablet Take 1 tablet (80 mg total) by mouth daily. Patient not taking: Reported on 03/05/2020 01/13/19   Raymond Negus, MD  colchicine 0.6 MG tablet Take 2 tabs by mouth at onset of flair, then 1 tab 1 hour later if needed. Patient not taking: Reported on 08/20/2019 02/06/19    Raymond Flood, MD  losartan (COZAAR) 100 MG tablet Take 1 tablet (100 mg total) by mouth daily. 08/20/19   Raymond Flood, MD  Nebivolol HCl (BYSTOLIC) 20 MG TABS Take 1 tablet (20 mg total) by mouth daily. 08/20/19   Raymond Flood, MD  omeprazole (PRILOSEC) 40 MG capsule Take 1 capsule (40 mg total) by mouth daily. 08/20/19   Raymond Flood, MD  tamsulosin (FLOMAX) 0.4 MG CAPS capsule daily. Patient not taking: Reported on 03/05/2020 01/02/19   [provider]    ALLERGIES:  Allergies  Allergen Reactions  . Lisinopril     angioedema  . Shrimp [Shellfish Allergy] Nausea And Vomiting    SOCIAL HISTORY:  Social History   Tobacco Use  . Smoking status: Never Smoker  . Smokeless tobacco: Never Used  Substance Use Topics  . Alcohol use: No    Alcohol/week: 0.0 standard drinks    FAMILY HISTORY: Family History  Problem Relation Age of Onset  . Diabetes Mother   . Heart disease Mother   . Hypertension Mother   . Hypertension Father   . Diabetes Sister   . Colon cancer Neg Hx   . Colon polyps Neg Hx   . Esophageal cancer Neg Hx   . Rectal cancer Neg Hx   . Stomach cancer Neg Hx  EXAM: BP (!) 190/97   Pulse 87   Temp 100 F (37.8 C) (Oral)   Resp 18   Ht 6' (1.829 m)   Wt 113.4 kg   SpO2 99%   BMI 33.91 kg/m  CONSTITUTIONAL: Alert and oriented and responds appropriately to questions. Well-appearing; well-nourished, nontoxic, well-hydrated HEAD: Normocephalic EYES: Conjunctivae clear, pupils appear equal, EOM appear intact ENT: normal nose; moist mucous membranes NECK: Supple, normal ROM CARD: RRR; S1 and S2 appreciated; no murmurs, no clicks, no rubs, no gallops RESP: Normal chest excursion without splinting or tachypnea; breath sounds clear and equal bilaterally; no wheezes, no rhonchi, no rales, no hypoxia or respiratory distress, speaking full sentences, no increased work of breathing ABD/GI: Normal bowel sounds; non-distended; soft,  non-tender, no rebound, no guarding, no peritoneal signs, no hepatosplenomegaly BACK:  The back appears normal EXT: Normal ROM in all joints; no deformity noted, no edema; no cyanosis SKIN: Normal color for age and race; warm; no rash on exposed skin NEURO: Moves all extremities equally PSYCH: The patient's mood and manner are appropriate.   MEDICAL DECISION MAKING: Patient here with symptoms of viral illness.  Covid test obtained in triage and was positive.  He has no shortness of breath, chest pain.  No increased work of breathing.  No vomiting.  Appears well-hydrated here.  Discussed supportive care instructions.  Given his history of chronic kidney disease and with his BMI being greater than 25, patient meets criteria for MAB.  We have discussed risk and benefits and patient would like to have this infusion.  Given it is the holidays, I am concerned that he may not get into the MAB infusion clinic within 10-day window of onset of his symptoms.  We will give MAB here in the emergency department.  Anticipate discharge home after infusion complete.  At this time, I do not feel there is any life-threatening condition present. I have reviewed, interpreted and discussed all results (EKG, imaging, lab, urine as appropriate) and exam findings with patient/family. I have reviewed nursing notes and appropriate previous records.  I feel the patient is safe to be discharged home without further emergent workup and can continue workup as an outpatient as needed. Discussed usual and customary return precautions. Patient/family verbalize understanding and are comfortable with this plan.  Outpatient follow-up has been provided as needed. All questions have been answered.   [name] was evaluated in Emergency Department on 06/10/2020 for the symptoms described in the history of present illness. He was evaluated in the context of the global COVID-19 pandemic, which necessitated consideration that the patient  might be at risk for infection with the SARS-CoV-2 virus that causes COVID-19. Institutional protocols and algorithms that pertain to the evaluation of patients at risk for COVID-19 are in a state of rapid change based on information released by regulatory bodies including the CDC and federal and state organizations. These policies and algorithms were followed during the patient's care in the ED.      Raymond Green, Layla Maw, DO 06/10/20 [id number]

## 2020-06-10 NOTE — Progress Notes (Signed)
Pharmacy COVID-19 Monoclonal Antibody Screening  Raymond Green was identified as being not hospitalized with symptoms from Covid-19 on admission but an incidental positive PCR has been documented.  The patient may qualify for the use of monoclonal antibodies (mAB) for COVID-19 viral infection to prevent worsening symptoms stemming from Covid-19 infection.  The patient was identified based on a positive COVID-19 PCR and not requiring the use of supplemental oxygen at this time.  This patient meets the FDA criteria for Emergency Use Authorization of casirivimab/imdevimab or bamlanivimab/etesevimab.  Has a (+) direct SARS-CoV-2 viral test result  Is NOT hospitalized due to COVID-19  Is within 10 days of symptom onset  Has at least one of the high risk factor(s) for progression to severe COVID-19 and/or hospitalization as defined in EUA.  Specific high risk criteria : BMI > 25  Additionally: The patient has not had a positive COVID-19 PCR in the last 90 days.  The patient is fully vaccinated against COVID-19.  Since the patient is partially or fully vaccinated for COVID-19, is asymptomatic with a cycle time of < 32, and meets high risk criteria, the patient is eligible for mAB administration.   This eligibility and indication for treatment was discussed with the patient's physician: Belenda Cruise Ward  Plan: Based on the above discussion, it was decided that the patient will receive one dose of the available COVID-19 mAB combination. Pharmacy will coordinate administration timing with patient's nurse. Recommended infusion monitoring parameters communicated to the nursing team.   Junita Push PharmD 06/10/2020  4:34 AM

## 2020-06-10 NOTE — ED Triage Notes (Signed)
Pt reports fever, runny nose and generalized body aches beginning 3 days ago.

## 2020-06-10 NOTE — Discharge Instructions (Signed)
You may alternate Tylenol 1000 mg every 6 hours as needed for pain, fever and Ibuprofen 800 mg every 8 hours as needed for pain, fever.  Please take Ibuprofen with food.  Do not take more than 4000 mg of Tylenol (acetaminophen) in a 24 hour period.  You will need to quarantine for 10 days after the onset of symptoms.  You will need to be fever free for at least 2 full days before coming out of quarantine.  You will need to isolate from asymptomatic family members.  You have received monoclonal antibodies here in the emergency department.  There is no other further treatment at this time for COVID-19.  Please rest and increase your fluid intake at home.  You may use over-the-counter medications such as nasal saline, Afrin as needed for nasal congestion.  You may use dextromethorphan, guaifenesin as needed for cough.

## 2020-08-26 ENCOUNTER — Other Ambulatory Visit: Payer: Self-pay | Admitting: Gastroenterology

## 2020-08-26 DIAGNOSIS — R109 Unspecified abdominal pain: Secondary | ICD-10-CM

## 2020-09-24 ENCOUNTER — Ambulatory Visit
Admission: RE | Admit: 2020-09-24 | Discharge: 2020-09-24 | Disposition: A | Discharge status: Home / Self Care | Payer: No Typology Code available for payment source | Source: Ambulatory Visit | Attending: Gastroenterology | Admitting: Gastroenterology

## 2020-09-24 DIAGNOSIS — R109 Unspecified abdominal pain: Secondary | ICD-10-CM

## 2020-09-24 MED ORDER — IOPAMIDOL (ISOVUE-300) INJECTION 61%
100.0000 mL | Freq: Once | INTRAVENOUS | Status: AC | PRN
  Administered 2020-09-24: 100 mL via INTRAVENOUS

## 2020-09-29 ENCOUNTER — Other Ambulatory Visit (HOSPITAL_COMMUNITY): Payer: Self-pay

## 2020-09-29 MED ORDER — CILIDINIUM-CHLORDIAZEPOXIDE 2.5-5 MG PO CAPS
ORAL_CAPSULE | ORAL | 1 refills | Status: DC
  Filled 2020-09-29: qty 60, 30d supply, fill #0

## 2020-09-30 ENCOUNTER — Other Ambulatory Visit (HOSPITAL_COMMUNITY): Payer: Self-pay

## 2020-10-05 ENCOUNTER — Other Ambulatory Visit (HOSPITAL_COMMUNITY): Payer: Self-pay

## 2020-10-05 ENCOUNTER — Telehealth (INDEPENDENT_AMBULATORY_CARE_PROVIDER_SITE_OTHER): Payer: PRIVATE HEALTH INSURANCE | Admitting: Registered Nurse

## 2020-10-05 ENCOUNTER — Encounter: Payer: Self-pay | Admitting: Registered Nurse

## 2020-10-05 ENCOUNTER — Other Ambulatory Visit: Payer: Self-pay

## 2020-10-05 DIAGNOSIS — J329 Chronic sinusitis, unspecified: Secondary | ICD-10-CM | POA: Diagnosis not present

## 2020-10-05 DIAGNOSIS — B9689 Other specified bacterial agents as the cause of diseases classified elsewhere: Secondary | ICD-10-CM | POA: Diagnosis not present

## 2020-10-05 DIAGNOSIS — R0981 Nasal congestion: Secondary | ICD-10-CM | POA: Diagnosis not present

## 2020-10-05 MED ORDER — AZELASTINE-FLUTICASONE 137-50 MCG/ACT NA SUSP
1.0000 | Freq: Two times a day (BID) | NASAL | 0 refills | Status: DC
  Filled 2020-10-05: qty 23, 30d supply, fill #0

## 2020-10-05 MED ORDER — AMOXICILLIN-POT CLAVULANATE 875-125 MG PO TABS
1.0000 | ORAL_TABLET | Freq: Two times a day (BID) | ORAL | 0 refills | Status: DC
  Filled 2020-10-05: qty 14, 7d supply, fill #0

## 2020-10-05 MED ORDER — GUAIFENESIN-DM 100-10 MG/5ML PO SYRP
5.0000 mL | ORAL_SOLUTION | ORAL | 0 refills | Status: DC | PRN
  Filled 2020-10-05: qty 118, 4d supply, fill #0

## 2020-10-05 NOTE — Progress Notes (Signed)
Telemedicine Encounter- SOAP NOTE Established Patient  This telephone encounter was conducted with the patient's (or proxy's) verbal consent via audio telecommunications: yes  Patient was instructed to have this encounter in a suitably private space; and to only have persons present to whom they give permission to participate. In addition, patient identity was confirmed by use of name plus two identifiers (DOB and address).  I discussed the limitations, risks, security and privacy concerns of performing an evaluation and management service by telephone and the availability of in person appointments. I also discussed with the patient that there may be a patient responsible charge related to this service. The patient expressed understanding and agreed to proceed.  I spent a total of 17 minutes talking with the patient or their proxy.  Chief Complaint  Patient presents with  . Nasal Congestion    Patient states he has been congested for over a week and  has tried some nasal spray and otc medications that has not made his congestion better. When he goes to sleep he has a hard time breathing.    Subjective   Raymond Green is a 55 y.o. established patient. Telephone visit today for congestion  HPI Ongoing for around one week ago Mostly nasal and sinus, no lung involvement, no cough Reports congestion is bad enough at night that he has trouble breathing Sometimes throat feels swollen Reports swollen lymph nodes in neck No pain or pressure in ears  Has tried flonase no relief. Has tried zyrtec no relief   Patient Active Problem List   Diagnosis Date Noted  . Mild coronary artery disease 02/12/2019  . Mixed hyperlipidemia 01/14/2019  . Chest pain 01/13/2019  . Palpitations 01/13/2019  . Low back pain 07/13/2016  . Abdominal pain, left lower quadrant 05/17/2016  . Elevated serum creatinine 05/29/2015  . Gout 10/29/2013  . Essential hypertension 03/02/2008  . GERD 03/02/2008   . Iron deficiency anemia 12/13/2007  . HEMORRHOIDS-EXTERNAL 12/13/2007    Past Medical History:  Diagnosis Date  . Allergy   . Anemia   . Erosive gastritis 2016  . GERD (gastroesophageal reflux disease)   . Headache(784.0)   . Hemorrhoids   . Hyperlipidemia   . Hypertension   . Kidney insufficiency   . LVH (left ventricular hypertrophy)    Dr. Donnie Aho ( cardiology)  . Obesity   . OSA (obstructive sleep apnea)   . Positive PPD, treated 2000   INH  . Sleep apnea    uses cpap  . Vitamin D deficiency     Current Outpatient Medications  Medication Sig Dispense Refill  . allopurinol (ZYLOPRIM) 100 MG tablet Take 1 tablet (100 mg total) by mouth daily. 90 tablet 3  . amLODipine (NORVASC) 5 MG tablet Take 1 tablet (5 mg total) by mouth daily. 90 tablet 1  . amoxicillin-clavulanate (AUGMENTIN) 875-125 MG tablet Take 1 tablet by mouth 2 (two) times daily. 14 tablet 0  . atorvastatin (LIPITOR) 40 MG tablet TAKE 1 TABLET BY MOUTH ONCE DAILY 90 tablet 3  . Azelastine-Fluticasone 137-50 MCG/ACT SUSP Place 1 spray into the nose every 12 (twelve) hours. 23 g 0  . clidinium-chlordiazePOXIDE (LIBRAX) 5-2.5 MG capsule take 1 capsule by mouth 2 times a day as needed 60 capsule 1  . guaiFENesin-dextromethorphan (ROBITUSSIN DM) 100-10 MG/5ML syrup Take 5 mLs by mouth every 4 (four) hours as needed for cough. 118 mL 0  . losartan (COZAAR) 100 MG tablet Take 1 tablet (100 mg total) by mouth  daily. 90 tablet 1  . Nebivolol HCl (BYSTOLIC) 20 MG TABS Take 1 tablet (20 mg total) by mouth daily. 90 tablet 1  . omeprazole (PRILOSEC) 40 MG capsule Take 1 capsule (40 mg total) by mouth daily. 90 capsule 1  . tamsulosin (FLOMAX) 0.4 MG CAPS capsule TAKE 1 CAPSULE BY MOUTH ONCE DAILY 30 capsule 6  . atorvastatin (LIPITOR) 80 MG tablet Take 1 tablet (80 mg total) by mouth daily. (Patient not taking: No sig reported) 90 tablet 3  . colchicine 0.6 MG tablet Take 2 tabs by mouth at onset of flair, then 1 tab 1  hour later if needed. (Patient not taking: No sig reported) 6 tablet 1  . tamsulosin (FLOMAX) 0.4 MG CAPS capsule daily. (Patient not taking: No sig reported)     Current Facility-Administered Medications  Medication Dose Route Frequency Provider Last Rate Last Admin  . 0.9 %  sodium chloride infusion  500 mL Intravenous Once [name], MD        Allergies  Allergen Reactions  . Lisinopril     angioedema  . Shrimp [Shellfish Allergy] Nausea And Vomiting    Social History   Socioeconomic History  . Marital status: Married    Spouse name: Not on file  . Number of children: 0  . Years of education: Not on file  . Highest education level: Not on file  Occupational History  . Occupation: ENDOSCOPY TECHNICIAN    Employer: [employer]  Tobacco Use  . Smoking status: Never Smoker  . Smokeless tobacco: Never Used  Vaping Use  . Vaping Use: Never used  Substance and Sexual Activity  . Alcohol use: No    Alcohol/week: 0.0 standard drinks  . Drug use: No  . Sexual activity: Yes  Other Topics Concern  . Not on file  Social History Narrative   Married   Education: Automotive engineer   Exercise: Yes   Social Determinants of Health   Financial Resource Strain: Not on file  Food Insecurity: Not on file  Transportation Needs: Not on file  Physical Activity: Not on file  Stress: Not on file  Social Connections: Not on file  Intimate Partner Violence: Not on file    Review of Systems  Constitutional: Negative.   HENT: Positive for congestion, sinus pain and sore throat.   Eyes: Negative.   Respiratory: Negative.   Cardiovascular: Negative.   Gastrointestinal: Negative.   Genitourinary: Negative.   Musculoskeletal: Negative.   Skin: Negative.   Neurological: Negative.   Endo/Heme/Allergies: Negative.   Psychiatric/Behavioral: Negative.   All other systems reviewed and are negative.   Objective   Vitals as reported by the patient: There were no vitals filed for this  visit.  Raymond Green was seen today for nasal congestion.  Diagnoses and all orders for this visit:  Bacterial sinusitis -     amoxicillin-clavulanate (AUGMENTIN) 875-125 MG tablet; Take 1 tablet by mouth 2 (two) times daily. -     guaiFENesin-dextromethorphan (ROBITUSSIN DM) 100-10 MG/5ML syrup; Take 5 mLs by mouth every 4 (four) hours as needed for cough. -     Azelastine-Fluticasone 137-50 MCG/ACT SUSP; Place 1 spray into the nose every 12 (twelve) hours.  Nasal congestion -     Ambulatory referral to ENT   PLAN  Will try augmentin po bid for 7 days given severity and duration of symptoms  Azelastine-fluticasone nasal spray  Robitussin dm for symptom relief  Pt request referral to ENT in case treatment plan ineffective  Patient encouraged to call clinic with any questions, comments, or concerns.   I discussed the assessment and treatment plan with the patient. The patient was provided an opportunity to ask questions and all were answered. The patient agreed with the plan and demonstrated an understanding of the instructions.   The patient was advised to call back or seek an in-person evaluation if the symptoms worsen or if the condition fails to improve as anticipated.  I provided 17 minutes of non-face-to-face time during this encounter.  Janeece Agee, NP  Primary Care at [facility]

## 2020-10-05 NOTE — Patient Instructions (Signed)
° ° ° °  If you have lab work done today you will be contacted with your lab results within the next 2 weeks.  If you have not heard from us then please contact us. The fastest way to get your results is to register for My Chart. ° ° °IF you received an x-ray today, you will receive an invoice from [facility]. Please contact [facility] at [phone] with questions or concerns regarding your invoice.  ° °IF you received labwork today, you will receive an invoice from [facility]. Please contact [facility] at [phone] with questions or concerns regarding your invoice.  ° °Our billing staff will not be able to assist you with questions regarding bills from these companies. ° °You will be contacted with the lab results as soon as they are available. The fastest way to get your results is to activate your My Chart account. Instructions are located on the last page of this paperwork. If you have not heard from us regarding the results in 2 weeks, please contact this office. °  ° ° ° °

## 2020-10-06 ENCOUNTER — Other Ambulatory Visit (HOSPITAL_COMMUNITY): Payer: Self-pay

## 2020-10-06 ENCOUNTER — Telehealth: Payer: Self-pay

## 2020-10-06 NOTE — Telephone Encounter (Signed)
You Rx Azelastine-fluticasone on 10/05/2020 for pt. This is not covered under their insurance is there an alternative that would work or should I start a PA for this medication?

## 2020-10-12 NOTE — Telephone Encounter (Signed)
PA completed 10/11/2020, faxed back to [facility].  Fax returned with approval for medication.  Pt can now pick up Rx from pharmacy  12 fills between 10/11/2020 to 10/10/2021 approved.   PA reference # [id number]  Plan Code [id number]

## 2020-11-23 DIAGNOSIS — J310 Chronic rhinitis: Secondary | ICD-10-CM | POA: Insufficient documentation

## 2020-11-23 DIAGNOSIS — J342 Deviated nasal septum: Secondary | ICD-10-CM | POA: Insufficient documentation

## 2020-12-01 ENCOUNTER — Other Ambulatory Visit (HOSPITAL_COMMUNITY): Payer: Self-pay

## 2020-12-01 MED ORDER — CLINDAMYCIN HCL 300 MG PO CAPS
ORAL_CAPSULE | ORAL | 0 refills | Status: DC
  Filled 2020-12-01: qty 42, 14d supply, fill #0

## 2020-12-02 ENCOUNTER — Other Ambulatory Visit (HOSPITAL_COMMUNITY): Payer: Self-pay

## 2020-12-02 MED ORDER — AZELASTINE-FLUTICASONE 137-50 MCG/ACT NA SUSP
NASAL | 11 refills | Status: DC
  Filled 2020-12-02: qty 23, 60d supply, fill #0

## 2021-02-11 ENCOUNTER — Other Ambulatory Visit (HOSPITAL_COMMUNITY): Payer: Self-pay

## 2021-02-11 ENCOUNTER — Other Ambulatory Visit: Payer: Self-pay | Admitting: Family Medicine

## 2021-02-11 ENCOUNTER — Other Ambulatory Visit: Payer: Self-pay | Admitting: Cardiology

## 2021-02-11 DIAGNOSIS — I10 Essential (primary) hypertension: Secondary | ICD-10-CM

## 2021-02-11 DIAGNOSIS — M109 Gout, unspecified: Secondary | ICD-10-CM

## 2021-02-11 DIAGNOSIS — E782 Mixed hyperlipidemia: Secondary | ICD-10-CM

## 2021-02-11 MED ORDER — LOSARTAN POTASSIUM 100 MG PO TABS
100.0000 mg | ORAL_TABLET | Freq: Every day | ORAL | 0 refills | Status: DC
  Filled 2021-02-11: qty 30, 30d supply, fill #0

## 2021-02-11 MED ORDER — COLCHICINE 0.6 MG PO TABS
ORAL_TABLET | ORAL | 0 refills | Status: DC
  Filled 2021-02-11: qty 6, 2d supply, fill #0

## 2021-02-11 MED ORDER — NEBIVOLOL HCL 20 MG PO TABS
1.0000 | ORAL_TABLET | Freq: Every day | ORAL | 0 refills | Status: DC
  Filled 2021-02-11: qty 30, 30d supply, fill #0

## 2021-02-11 MED ORDER — AMLODIPINE BESYLATE 5 MG PO TABS
5.0000 mg | ORAL_TABLET | Freq: Every day | ORAL | 0 refills | Status: DC
  Filled 2021-02-11: qty 30, 30d supply, fill #0

## 2021-02-17 ENCOUNTER — Other Ambulatory Visit (HOSPITAL_COMMUNITY): Payer: Self-pay

## 2021-02-17 MED ORDER — ATORVASTATIN CALCIUM 40 MG PO TABS
40.0000 mg | ORAL_TABLET | Freq: Every day | ORAL | 3 refills | Status: DC
  Filled 2021-02-17: qty 90, 90d supply, fill #0
  Filled 2021-10-13: qty 90, 90d supply, fill #1
  Filled 2022-01-04: qty 90, 90d supply, fill #2

## 2021-02-17 MED ORDER — SPIRONOLACTONE 50 MG PO TABS
50.0000 mg | ORAL_TABLET | Freq: Every day | ORAL | 3 refills | Status: DC
  Filled 2021-02-17: qty 90, 90d supply, fill #0
  Filled 2022-01-04: qty 90, 90d supply, fill #1

## 2021-03-21 ENCOUNTER — Other Ambulatory Visit (HOSPITAL_COMMUNITY): Payer: Self-pay

## 2021-03-21 ENCOUNTER — Other Ambulatory Visit: Payer: Self-pay | Admitting: Family Medicine

## 2021-03-21 DIAGNOSIS — K219 Gastro-esophageal reflux disease without esophagitis: Secondary | ICD-10-CM

## 2021-03-22 ENCOUNTER — Other Ambulatory Visit (HOSPITAL_COMMUNITY): Payer: Self-pay

## 2021-05-09 ENCOUNTER — Ambulatory Visit (INDEPENDENT_AMBULATORY_CARE_PROVIDER_SITE_OTHER): Payer: No Typology Code available for payment source | Admitting: Family Medicine

## 2021-05-09 ENCOUNTER — Other Ambulatory Visit (HOSPITAL_COMMUNITY): Payer: Self-pay

## 2021-05-09 VITALS — BP 138/78 | HR 58 | Temp 98.2°F | Resp 16 | Ht 72.0 in | Wt 243.4 lb

## 2021-05-09 DIAGNOSIS — R0981 Nasal congestion: Secondary | ICD-10-CM

## 2021-05-09 DIAGNOSIS — I10 Essential (primary) hypertension: Secondary | ICD-10-CM

## 2021-05-09 DIAGNOSIS — Z2989 Encounter for other specified prophylactic measures: Secondary | ICD-10-CM

## 2021-05-09 DIAGNOSIS — K219 Gastro-esophageal reflux disease without esophagitis: Secondary | ICD-10-CM | POA: Diagnosis not present

## 2021-05-09 DIAGNOSIS — A09 Infectious gastroenteritis and colitis, unspecified: Secondary | ICD-10-CM | POA: Diagnosis not present

## 2021-05-09 DIAGNOSIS — Z298 Encounter for other specified prophylactic measures: Secondary | ICD-10-CM | POA: Diagnosis not present

## 2021-05-09 DIAGNOSIS — R946 Abnormal results of thyroid function studies: Secondary | ICD-10-CM | POA: Diagnosis not present

## 2021-05-09 DIAGNOSIS — E785 Hyperlipidemia, unspecified: Secondary | ICD-10-CM

## 2021-05-09 MED ORDER — AMLODIPINE BESYLATE 5 MG PO TABS
5.0000 mg | ORAL_TABLET | Freq: Every day | ORAL | 1 refills | Status: DC
  Filled 2021-05-09: qty 90, 90d supply, fill #0
  Filled 2021-10-13: qty 90, 90d supply, fill #1

## 2021-05-09 MED ORDER — LOSARTAN POTASSIUM 100 MG PO TABS
100.0000 mg | ORAL_TABLET | Freq: Every day | ORAL | 1 refills | Status: DC
  Filled 2021-05-09: qty 90, 90d supply, fill #0
  Filled 2021-10-13: qty 90, 90d supply, fill #1

## 2021-05-09 MED ORDER — DOXYCYCLINE HYCLATE 100 MG PO TABS
100.0000 mg | ORAL_TABLET | Freq: Every day | ORAL | 0 refills | Status: DC
  Filled 2021-05-09: qty 50, 50d supply, fill #0

## 2021-05-09 MED ORDER — NEBIVOLOL HCL 20 MG PO TABS
1.0000 | ORAL_TABLET | Freq: Every day | ORAL | 1 refills | Status: DC
  Filled 2021-05-09: qty 90, 90d supply, fill #0
  Filled 2021-10-13: qty 90, 90d supply, fill #1

## 2021-05-09 MED ORDER — AZELASTINE-FLUTICASONE 137-50 MCG/ACT NA SUSP
1.0000 | Freq: Two times a day (BID) | NASAL | 1 refills | Status: DC
  Filled 2021-05-09: qty 23, 30d supply, fill #0

## 2021-05-09 MED ORDER — AZITHROMYCIN 500 MG PO TABS
500.0000 mg | ORAL_TABLET | Freq: Every day | ORAL | 0 refills | Status: DC
  Filled 2021-05-09: qty 3, 3d supply, fill #0

## 2021-05-09 MED ORDER — OMEPRAZOLE 40 MG PO CPDR
40.0000 mg | DELAYED_RELEASE_CAPSULE | Freq: Every day | ORAL | 1 refills | Status: DC
Start: 2021-05-09 — End: 2022-03-01
  Filled 2021-05-09: qty 90, 90d supply, fill #0
  Filled 2021-10-13: qty 90, 90d supply, fill #1

## 2021-05-09 NOTE — Patient Instructions (Addendum)
Doxycycline once per day when traveling, start 1 to 2 days before leaving for your trip and continue for 4 additional weeks when you return.  If you do have signs or symptoms of traveler's diarrhea, can start the azithromycin 1 pill/day for 3 days.  As we discussed if that does not help or worsening of symptoms be seen by medical provider.  No change in blood pressure, cholesterol medications today.  I will check some labs. I did feel a possible nodular firm area on the right side of your thyroid.  I am checking a thyroid test, but will also order an ultrasound to evaluate that area further.   I will refer you to ENT, but try restarting Dymista for now.

## 2021-05-09 NOTE — Progress Notes (Signed)
Subjective:  Patient ID: Raymond Green, male    DOB: 14-Aug-1965  Age: 55 y.o. MRN: [id number]  CC:  Chief Complaint  Patient presents with   Hypertension    Pt in need of refills, doing well, no physical sxs   Travel Consult    Pt [place] go [place] would like malaria treatment and other recommended therapies    Gastroesophageal Reflux    In need of refills     HPI Raymond Green presents for   Now working with Deboraha Sprang   Hypertension: With history of mild coronary disease, CKD followed by nephrology - appt in September. . Treated with amlodipine 5 mg daily, spironolactone 50 mg daily, Bystolic 20 mg daily.   Home readings: BP Readings from Last 3 Encounters:  05/09/21 138/78  06/10/20 (!) 190/97  03/05/20 (!) 143/78   Lab Results  Component Value Date   CREATININE 1.39 (H) 08/20/2019   Gout: Last flare: Daily meds: Allopurinol 100 mg daily Prn med: Colchicine as needed Lab Results  Component Value Date   LABURIC 6.7 11/22/2017   GERD Gastroenterology Dr. Christella Hartigan, treated with omeprazole 40 mg daily which has been effective.    Hyperlipidemia: Lipitor 80 mg daily Lab Results  Component Value Date   CHOL 149 08/20/2019   HDL 54 08/20/2019   LDLCALC 77 08/20/2019   TRIG 99 08/20/2019   CHOLHDL 2.8 08/20/2019   Lab Results  Component Value Date   ALT 14 08/20/2019   AST 20 08/20/2019   ALKPHOS 69 08/20/2019   BILITOT 0.4 08/20/2019    Malaria prophylaxis Will be traveling to [place], [place]. Father passed away 1 year ago from Covid related illness.will be visiting mom.  Duration of trip 12/3 -12/20.  COVID-19 booster - had bivalent vaccine 2-3 weeks ago.  Flu vaccine - few months ago at [facility].   Chronic rhinitis with deviated septum Treated in April for bacteria sinusitis here, evaluated by ENT in June, had been treated with Dymista, 2-week course of clindamycin, after CT indicating pansinusitis with occlusion of most  sinus drainage pathways.  Mild rightward nasal septal deviation with rightward projecting spur on CT imaging June 13. Still some chronic nasal congestion at night. Not on nasal spray now - dymista without much improvement. Ha not had recent ENT eval.  Wants second opinion - requests Dr. Annalee Genta, not PA. No afrin, no current nasal sprays.    History Patient Active Problem List   Diagnosis Date Noted   Mild coronary artery disease 02/12/2019   Mixed hyperlipidemia 01/14/2019   Chest pain 01/13/2019   Palpitations 01/13/2019   Low back pain 07/13/2016   Abdominal pain, left lower quadrant 05/17/2016   Elevated serum creatinine 05/29/2015   Gout 10/29/2013   Essential hypertension 03/02/2008   GERD 03/02/2008   Iron deficiency anemia 12/13/2007   HEMORRHOIDS-EXTERNAL 12/13/2007   Past Medical History:  Diagnosis Date   Allergy    Anemia    Erosive gastritis 2016   GERD (gastroesophageal reflux disease)    Headache(784.0)    Hemorrhoids    Hyperlipidemia    Hypertension    Kidney insufficiency    LVH (left ventricular hypertrophy)    Dr. Donnie Aho ( cardiology)   Obesity    OSA (obstructive sleep apnea)    Positive PPD, treated 2000   INH   Sleep apnea    uses cpap   Vitamin D deficiency    Past Surgical History:  Procedure Laterality  Date   COLONOSCOPY  12/28/2011   Procedure: COLONOSCOPY;  Surgeon: [name], MD;  Location: [facility];  Service: Endoscopy;  Laterality: N/A;   HEMORRHOID SURGERY     UPPER GASTROINTESTINAL ENDOSCOPY     Allergies  Allergen Reactions   Lisinopril     angioedema   Shrimp [Shellfish Allergy] Nausea And Vomiting   Prior to Admission medications   Medication Sig Start Date End Date Taking? Authorizing Provider  amLODipine (NORVASC) 5 MG tablet Take 1 tablet (5 mg total) by mouth daily.  Need to make appointment 02/11/21  Yes Shade Flood, MD  atorvastatin (LIPITOR) 40 MG tablet Take 1 tablet by mouth daily 02/17/21  Yes    clidinium-chlordiazePOXIDE (LIBRAX) 5-2.5 MG capsule take 1 capsule by mouth 2 times a day as needed 09/27/20  Yes   colchicine 0.6 MG tablet Take 2 tabs by mouth at onset of flair, then 1 tab 1 hour later if needed. 02/11/21  Yes Shade Flood, MD  losartan (COZAAR) 100 MG tablet Take 1 tablet (100 mg total) by mouth daily. Patient needs an appointment 02/11/21  Yes Shade Flood, MD  Nebivolol HCl (BYSTOLIC) 20 MG TABS Take 1 tablet (20 mg total) by mouth daily. Patient needs an appointment 02/11/21  Yes Shade Flood, MD  omeprazole (PRILOSEC) 40 MG capsule Take 1 capsule (40 mg total) by mouth daily. 08/20/19  Yes Shade Flood, MD  spironolactone (ALDACTONE) 50 MG tablet Take 1 tablet by mouth daily 02/17/21  Yes   Azelastine-Fluticasone 137-50 MCG/ACT SUSP Place 1 spray into the nose every 12 (twelve) hours as directed. Patient not taking: Reported on 05/09/2021 10/05/20   Janeece Agee, NP   Social History   Socioeconomic History   Marital status: Married    Spouse name: Not on file   Number of children: 0   Years of education: Not on file   Highest education level: Not on file  Occupational History   Occupation: ENDOSCOPY TECHNICIAN    Employer:   Tobacco Use   Smoking status: Never   Smokeless tobacco: Never  Vaping Use   Vaping Use: Never used  Substance and Sexual Activity   Alcohol use: No    Alcohol/week: 0.0 standard drinks   Drug use: No   Sexual activity: Yes  Other Topics Concern   Not on file  Social History Narrative   Married   Education: Automotive engineer   Exercise: Yes   Social Determinants of Health   Financial Resource Strain: Not on file  Food Insecurity: Not on file  Transportation Needs: Not on file  Physical Activity: Not on file  Stress: Not on file  Social Connections: Not on file  Intimate Partner Violence: Not on file    Review of Systems   Objective:   Vitals:   05/09/21 1316  BP: 138/78  Pulse: (!) 58  Resp: 16   Temp: 98.2 F (36.8 C)  TempSrc: Temporal  SpO2: 98%  Weight: 243 lb 6.4 oz (110.4 kg)  Height: 6' (1.829 m)     Physical Exam Vitals reviewed.  Constitutional:      Appearance: He is well-developed.  HENT:     Head: Normocephalic and atraumatic.     Nose:     Comments: Sinuses nontender.  Neck:     Vascular: No carotid bruit or JVD.     Comments: Firm area R thyroid - upper aspect, nontender.  Cardiovascular:     Rate and Rhythm: Normal  rate and regular rhythm.     Heart sounds: Normal heart sounds. No murmur heard. Pulmonary:     Effort: Pulmonary effort is normal.     Breath sounds: Normal breath sounds. No rales.  Musculoskeletal:     Right lower leg: No edema.     Left lower leg: No edema.  Skin:    General: Skin is warm and dry.  Neurological:     Mental Status: He is alert and oriented to person, place, and time.  Psychiatric:        Mood and Affect: Mood normal.        Behavior: Behavior normal.     Assessment & Plan:  Raymond Green is a 55 y.o. male . Need for malaria prophylaxis - Plan: doxycycline (VIBRA-TABS) 100 MG tablet  -Doxycycline 1 to 2 days prior to travel, continue during travel and for 4 weeks upon return.  Potential side effects discussed.  Benign essential HTN - Plan: amLODipine (NORVASC) 5 MG tablet, Nebivolol HCl (BYSTOLIC) 20 MG TABS, Lipid panel Essential hypertension - Plan: losartan (COZAAR) 100 MG tablet, Comprehensive metabolic panel  -Stable, check labs, no med changes for now.  Gastroesophageal reflux disease, unspecified whether esophagitis present - Plan: omeprazole (PRILOSEC) 40 MG capsule  -Stable, continue omeprazole, trigger avoidance.    Traveler's diarrhea - Plan: azithromycin (ZITHROMAX) 500 MG tablet  -Indications for use of antibiotic with travel discussed as well as potential side effects, risks and need for medical care if persistent or worsening symptoms.  Abnormal thyroid exam - Plan: US THYROID,  TSH  -Possible nodule area on right side, check TSH, ultrasound.  Hyperlipidemia, unspecified hyperlipidemia type  -Check lipid panel  Chronic nasal congestion - Plan: Ambulatory referral to ENT, Azelastine-Fluticasone 137-50 MCG/ACT SUSP  -Persistent chronic nasal congestion, refer back to ENT, refilled Dymista for now as trial to see if it will help with symptoms in the interim.  Meds ordered this encounter  Medications   amLODipine (NORVASC) 5 MG tablet    Sig: Take 1 tablet (5 mg total) by mouth daily.    Dispense:  90 tablet    Refill:  1   omeprazole (PRILOSEC) 40 MG capsule    Sig: Take 1 capsule (40 mg total) by mouth daily.    Dispense:  90 capsule    Refill:  1   Nebivolol HCl (BYSTOLIC) 20 MG TABS    Sig: Take 1 tablet (20 mg total) by mouth daily.    Dispense:  90 tablet    Refill:  1   losartan (COZAAR) 100 MG tablet    Sig: Take 1 tablet (100 mg total) by mouth daily.    Dispense:  90 tablet    Refill:  1   doxycycline (VIBRA-TABS) 100 MG tablet    Sig: Take 1 tablet (100 mg total) by mouth daily.    Dispense:  50 tablet    Refill:  0   azithromycin (ZITHROMAX) 500 MG tablet    Sig: Take 1 tablet (500 mg total) by mouth daily. If signs or symptoms of travelers diarrhea.    Dispense:  3 tablet    Refill:  0   Azelastine-Fluticasone 137-50 MCG/ACT SUSP    Sig: Place 1 spray into the nose every 12 (twelve) hours as directed.    Dispense:  23 g    Refill:  1   Patient Instructions  Doxycycline once per day when traveling, start 1 to 2 days before leaving for your trip and continue  for 4 additional weeks when you return.  If you do have signs or symptoms of traveler's diarrhea, can start the azithromycin 1 pill/day for 3 days.  As we discussed if that does not help or worsening of symptoms be seen by medical provider.  No change in blood pressure, cholesterol medications today.  I will check some labs. I did feel a possible nodular firm area on the right side of  your thyroid.  I am checking a thyroid test, but will also order an ultrasound to evaluate that area further.   I will refer you to ENT, but try restarting Dymista for now.     Signed,   [name], MD [facility], [facility] 05/09/21 2:15 PM

## 2021-05-10 ENCOUNTER — Other Ambulatory Visit (HOSPITAL_COMMUNITY): Payer: Self-pay

## 2021-05-10 LAB — LIPID PANEL
Cholesterol: 202 mg/dL — ABNORMAL HIGH (ref 0–200)
HDL: 53 mg/dL (ref >39.00)
LDL Cholesterol: 121 mg/dL — ABNORMAL HIGH (ref 0–99)
NonHDL: 149.19
Total CHOL/HDL Ratio: 4
Triglycerides: 140 mg/dL (ref 0.0–149.0)
VLDL: 28 mg/dL (ref 0.0–40.0)

## 2021-05-10 LAB — COMPREHENSIVE METABOLIC PANEL
ALT: 14 U/L (ref 0–53)
AST: 19 U/L (ref 0–37)
Albumin: 4.4 g/dL (ref 3.5–5.2)
Alkaline Phosphatase: 52 U/L (ref 39–117)
BUN: 22 mg/dL (ref 6–23)
CO2: 29 mEq/L (ref 19–32)
Calcium: 10.5 mg/dL (ref 8.4–10.5)
Chloride: 104 mEq/L (ref 96–112)
Creatinine, Ser: 1.42 mg/dL (ref 0.40–1.50)
GFR: 55.82 mL/min — ABNORMAL LOW (ref >60.00)
Glucose, Bld: 80 mg/dL (ref 70–99)
Potassium: 5.3 mEq/L — ABNORMAL HIGH (ref 3.5–5.1)
Sodium: 138 mEq/L (ref 135–145)
Total Bilirubin: 0.5 mg/dL (ref 0.2–1.2)
Total Protein: 7.5 g/dL (ref 6.0–8.3)

## 2021-05-10 LAB — TSH: TSH: 2.01 u[IU]/mL (ref 0.35–5.50)

## 2021-05-17 ENCOUNTER — Other Ambulatory Visit: Payer: Self-pay | Admitting: Family Medicine

## 2021-05-17 DIAGNOSIS — E875 Hyperkalemia: Secondary | ICD-10-CM

## 2021-07-21 ENCOUNTER — Telehealth: Payer: Self-pay

## 2021-07-21 NOTE — Telephone Encounter (Signed)
Message also sent to the pt via My Chart to call and set up colon at his convenience.

## 2021-07-21 NOTE — Telephone Encounter (Signed)
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 Can you contact him to help arrange it? [facility] is OK if that is OK with him.  Thanks

## 2021-07-21 NOTE — Telephone Encounter (Signed)
Left message on machine to call back  

## 2021-10-13 ENCOUNTER — Other Ambulatory Visit (HOSPITAL_COMMUNITY): Payer: Self-pay

## 2021-11-08 ENCOUNTER — Encounter: Payer: Self-pay | Admitting: Gastroenterology

## 2021-11-25 ENCOUNTER — Encounter: Payer: Self-pay | Admitting: Gastroenterology

## 2021-12-14 ENCOUNTER — Other Ambulatory Visit (HOSPITAL_COMMUNITY): Payer: Self-pay

## 2021-12-14 ENCOUNTER — Ambulatory Visit (AMBULATORY_SURGERY_CENTER): Payer: Self-pay | Admitting: *Deleted

## 2021-12-14 VITALS — Ht 72.0 in | Wt 235.0 lb

## 2021-12-14 DIAGNOSIS — Z1211 Encounter for screening for malignant neoplasm of colon: Secondary | ICD-10-CM

## 2021-12-14 MED ORDER — NA SULFATE-K SULFATE-MG SULF 17.5-3.13-1.6 GM/177ML PO SOLN
1.0000 | Freq: Once | ORAL | 0 refills | Status: AC
  Filled 2021-12-14: qty 354, 1d supply, fill #0

## 2021-12-14 NOTE — Progress Notes (Signed)
No egg or soy allergy known to patient  No issues known to pt with past sedation with any surgeries or procedures Patient denies ever being told they had issues or difficulty with intubation  No FH of Malignant Hyperthermia Pt is not on diet pills Pt is not on  home 02  Pt is not on blood thinners  Pt denies issues with constipation  No A fib or A flutter    NO PA's for preps discussed with pt In PV today  Discussed with pt there will be an out-of-pocket cost for prep and that varies from $0 to 70 +  dollars - pt verbalized understanding  Pt instructed to use [email or web address].[email or web address] or GoodRx for a price reduction on prep  

## 2022-01-04 ENCOUNTER — Ambulatory Visit (INDEPENDENT_AMBULATORY_CARE_PROVIDER_SITE_OTHER): Payer: No Typology Code available for payment source | Admitting: Family Medicine

## 2022-01-04 ENCOUNTER — Encounter: Payer: Self-pay | Admitting: Family Medicine

## 2022-01-04 ENCOUNTER — Other Ambulatory Visit (HOSPITAL_COMMUNITY)
Admission: RE | Admit: 2022-01-04 | Discharge: 2022-01-04 | Disposition: A | Discharge status: Home / Self Care | Payer: No Typology Code available for payment source | Source: Ambulatory Visit | Attending: Family Medicine | Admitting: Family Medicine

## 2022-01-04 ENCOUNTER — Other Ambulatory Visit (HOSPITAL_COMMUNITY): Payer: Self-pay

## 2022-01-04 ENCOUNTER — Other Ambulatory Visit: Payer: Self-pay | Admitting: Family Medicine

## 2022-01-04 VITALS — BP 138/80 | HR 49 | Temp 98.5°F | Resp 20 | Ht 70.0 in | Wt 233.0 lb

## 2022-01-04 DIAGNOSIS — Z113 Encounter for screening for infections with a predominantly sexual mode of transmission: Secondary | ICD-10-CM

## 2022-01-04 DIAGNOSIS — R61 Generalized hyperhidrosis: Secondary | ICD-10-CM

## 2022-01-04 DIAGNOSIS — E785 Hyperlipidemia, unspecified: Secondary | ICD-10-CM | POA: Diagnosis not present

## 2022-01-04 DIAGNOSIS — Z0001 Encounter for general adult medical examination with abnormal findings: Secondary | ICD-10-CM

## 2022-01-04 DIAGNOSIS — Z1159 Encounter for screening for other viral diseases: Secondary | ICD-10-CM

## 2022-01-04 DIAGNOSIS — I10 Essential (primary) hypertension: Secondary | ICD-10-CM

## 2022-01-04 DIAGNOSIS — Z125 Encounter for screening for malignant neoplasm of prostate: Secondary | ICD-10-CM | POA: Diagnosis not present

## 2022-01-04 DIAGNOSIS — Z131 Encounter for screening for diabetes mellitus: Secondary | ICD-10-CM

## 2022-01-04 DIAGNOSIS — K219 Gastro-esophageal reflux disease without esophagitis: Secondary | ICD-10-CM

## 2022-01-04 DIAGNOSIS — Z0000 Encounter for general adult medical examination without abnormal findings: Secondary | ICD-10-CM

## 2022-01-04 MED ORDER — ALUMINUM CHLORIDE 20 % EX SOLN
Freq: Every day | CUTANEOUS | 0 refills | Status: DC
  Filled 2022-01-04: qty 35, 30d supply, fill #0

## 2022-01-04 NOTE — Progress Notes (Signed)
Subjective:  Patient ID: Raymond Green, male    DOB: 08-Mar-1966  Age: 56 y.o. MRN: [id number]  CC:  Chief Complaint  Patient presents with   Hypertension    Pt notes he has had some increased bp readings thinks this is related to stress in recent weeks.  Pt was scheduled for a physical today    Excessive Sweating    Pt notes that he has had a lot of sweating on his feet recently notes he feels as tho he is standing in water     HPI Raymond Green presents for Annual Exam  With acute concerns as above.  Hypertension: Reports elevated readings recently.  Some stress recently - tough time with family, handling ok - denies needs form me.  150/100-110. Wrist monitor.  No CP/dyspnea.  Trouble getting back to sleep after waking up - thinking about stressors. No treatments.  Currently treated with amlodipine 5 mg daily, losartan 100 mg daily, spironolactone 50 mg daily, bystolic 80HO qd.  Home readings as above.  No missed doses of meds.  BP Readings from Last 3 Encounters:  01/04/22 138/80  05/09/21 138/78  06/10/20 (!) 190/97   Lab Results  Component Value Date   CREATININE 1.42 05/09/2021   Excessive sweating Noted recently in his feet. Past 2-3 weeks. No treatments.   STI screening: Monogamous wife spouse, but wants to be screened.   Hyperlipidemia: Lipitor 40 mg daily. No new side effects.  Lab Results  Component Value Date   CHOL 202 (H) 05/09/2021   HDL 53.00 05/09/2021   LDLCALC 121 (H) 05/09/2021   TRIG 140.0 05/09/2021   CHOLHDL 4 05/09/2021   Lab Results  Component Value Date   ALT 14 05/09/2021   AST 19 05/09/2021   ALKPHOS 52 05/09/2021   BILITOT 0.5 05/09/2021           01/04/2022    3:42 PM 05/09/2021    1:20 PM 10/05/2020    1:06 PM 03/05/2020    4:43 PM 08/20/2019    3:43 PM  Depression screen PHQ 2/9  Decreased Interest 0 0 0 0 0  Down, Depressed, Hopeless 2 0 0 0 0  PHQ - 2 Score 2 0 0 0 0  Altered sleeping 2      Tired,  decreased energy 0      Change in appetite 0      Feeling bad or failure about yourself  0      Trouble concentrating 0      Moving slowly or fidgety/restless 0      Suicidal thoughts 0      PHQ-9 Score 4        Health Maintenance  Topic Date Due   COVID-19 Vaccine (4 - Pfizer series) 01/20/2022 (Originally 05/28/2020)   Hepatitis C Screening  01/05/2023 (Originally 05/06/1984)   INFLUENZA VACCINE  01/17/2022   COLONOSCOPY (Pts 45-30yr Insurance coverage will need to be confirmed)  09/23/2024   TETANUS/TDAP  08/31/2025   HIV Screening  Completed   Zoster Vaccines- Shingrix  Completed   HPV VACCINES  Aged Out  Colonoscopy 2016-  Dr. HAmedeo Plenty now followed by Dr. JArdis Hughs Has appt 7/26.  Prostate: does NOT have family history of prostate cancer The natural history of prostate cancer and ongoing controversy regarding screening and potential treatment outcomes of prostate cancer has been discussed with the patient. The meaning of a false positive PSA and a false negative PSA has been discussed. He indicates understanding  of the limitations of this screening test and wishes to proceed with screening PSA testing. Lab Results  Component Value Date   PSA1 0.3 12/11/2017   PSA 0.37 09/01/2015   PSA 0.27 12/26/2013      Immunization History  Administered Date(s) Administered   Influenza,inj,Quad PF,6+ Mos 03/23/2014, 02/22/2015   Influenza-Unspecified 03/19/2017   Meningococcal Conjugate 01/12/2014   PFIZER(Purple Top)SARS-COV-2 Vaccination 07/04/2019, 07/22/2019, 04/02/2020   Tdap 09/01/2015   Zoster Recombinat (Shingrix) 12/13/2017, 02/22/2018  Bilvalent covid booster x2 at [facility].   No results found. Optho - due for visit, appt with Dr. Katy Fitch later this year.   Dental: needs names.   Alcohol:none  Tobacco: none  Exercise:walking on breaks, gym on treadmill at times. 86mn per day.    History Patient Active Problem List   Diagnosis Date Noted   Mild coronary artery  disease 02/12/2019   Mixed hyperlipidemia 01/14/2019   Chest pain 01/13/2019   Palpitations 01/13/2019   Low back pain 07/13/2016   Abdominal pain, left lower quadrant 05/17/2016   Elevated serum creatinine 05/29/2015   Gout 10/29/2013   Essential hypertension 03/02/2008   GERD 03/02/2008   Iron deficiency anemia 12/13/2007   HEMORRHOIDS-EXTERNAL 12/13/2007   Past Medical History:  Diagnosis Date   Allergy    Anemia    Erosive gastritis 2016   GERD (gastroesophageal reflux disease)    Headache(784.0)    Hemorrhoids    Hyperlipidemia    Hypertension    Kidney insufficiency    LVH (left ventricular hypertrophy)    Dr. TWynonia Lawman( cardiology) palpitations   Obesity    OSA (obstructive sleep apnea)    Positive PPD, treated 2000   INH   Sleep apnea    uses cpap   Vitamin D deficiency    Past Surgical History:  Procedure Laterality Date   COLONOSCOPY  12/28/2011   Procedure: COLONOSCOPY;  Surgeon: DMilus Banister MD;  Location: [facility];  Service: Endoscopy;  Laterality: N/A;   HEMORRHOID SURGERY     UPPER GASTROINTESTINAL ENDOSCOPY     Allergies  Allergen Reactions   Lisinopril     angioedema   Shrimp [Shellfish Allergy] Nausea And Vomiting   Prior to Admission medications   Medication Sig Start Date End Date Taking? Authorizing Provider  amLODipine (NORVASC) 5 MG tablet Take 1 tablet (5 mg total) by mouth daily. 05/09/21  Yes GWendie Agreste MD  atorvastatin (LIPITOR) 40 MG tablet Take 1 tablet by mouth daily 02/17/21  Yes   losartan (COZAAR) 100 MG tablet Take 1 tablet (100 mg total) by mouth daily. 05/09/21  Yes GWendie Agreste MD  Nebivolol HCl (BYSTOLIC) 20 MG TABS Take 1 tablet (20 mg total) by mouth daily. 05/09/21  Yes GWendie Agreste MD  omeprazole (PRILOSEC) 40 MG capsule Take 1 capsule (40 mg total) by mouth daily. 05/09/21  Yes GWendie Agreste MD  spironolactone (ALDACTONE) 50 MG tablet Take 1 tablet by mouth daily 02/17/21  Yes   azithromycin  (ZITHROMAX) 500 MG tablet Take 1 tablet (500 mg total) by mouth daily. If signs or symptoms of travelers diarrhea. Patient not taking: Reported on 12/14/2021 05/09/21   GWendie Agreste MD  doxycycline (VIBRA-TABS) 100 MG tablet Take 1 tablet (100 mg total) by mouth daily. Patient not taking: Reported on 12/14/2021 05/09/21   GWendie Agreste MD   Social History   Socioeconomic History   Marital status: Married    Spouse name: Not on file   Number  of children: 0   Years of education: Not on file   Highest education level: Not on file  Occupational History   Occupation: ENDOSCOPY TECHNICIAN    Employer: [employer]  Tobacco Use   Smoking status: Never   Smokeless tobacco: Never  Vaping Use   Vaping Use: Never used  Substance and Sexual Activity   Alcohol use: No    Alcohol/week: 0.0 standard drinks of alcohol   Drug use: No   Sexual activity: Yes  Other Topics Concern   Not on file  Social History Narrative   Married   Education: Secretary/administrator   Exercise: Yes   Social Determinants of Health   Financial Resource Strain: Not on file  Food Insecurity: Not on file  Transportation Needs: Not on file  Physical Activity: Not on file  Stress: Not on file  Social Connections: Not on file  Intimate Partner Violence: Not on file    Review of Systems  Constitutional:  Negative for fatigue and unexpected weight change.  Eyes:  Negative for visual disturbance.  Respiratory:  Negative for cough, chest tightness and shortness of breath.   Cardiovascular:  Negative for chest pain, palpitations and leg swelling.  Gastrointestinal:  Negative for abdominal pain and blood in stool.  Neurological:  Negative for dizziness, light-headedness and headaches.   13 point review of systems per patient health survey noted.  Negative other than as indicated above or in HPI.    Objective:   Vitals:   01/04/22 1537  BP: 138/80  Pulse: (!) 49  Resp: 20  Temp: 98.5 F (36.9 C)  TempSrc: Oral   SpO2: 97%  Weight: 233 lb (105.7 kg)  Height: _0  (1.778 m)     Physical Exam Vitals reviewed.  Constitutional:      Appearance: He is well-developed.  HENT:     Head: Normocephalic and atraumatic.     Right Ear: External ear normal.     Left Ear: External ear normal.  Eyes:     Conjunctiva/sclera: Conjunctivae normal.     Pupils: Pupils are equal, round, and reactive to light.  Neck:     Thyroid: No thyromegaly.  Cardiovascular:     Rate and Rhythm: Normal rate and regular rhythm.     Heart sounds: Normal heart sounds.  Pulmonary:     Effort: Pulmonary effort is normal. No respiratory distress.     Breath sounds: Normal breath sounds. No wheezing.  Abdominal:     General: There is no distension.     Palpations: Abdomen is soft.     Tenderness: There is no abdominal tenderness.  Musculoskeletal:        General: No tenderness. Normal range of motion.     Cervical back: Normal range of motion and neck supple.  Lymphadenopathy:     Cervical: No cervical adenopathy.  Skin:    General: Skin is warm and dry.  Neurological:     Mental Status: He is alert and oriented to person, place, and time.     Deep Tendon Reflexes: Reflexes are normal and symmetric.  Psychiatric:        Behavior: Behavior normal.       Assessment & Plan:  Raymond Green is a 56 y.o. male . Annual physical exam - Plan: Comprehensive metabolic panel, Lipid panel, PSA, Hemoglobin A1c, Hepatitis C antibody  - -anticipatory guidance as below in AVS, screening labs above. Health maintenance items as above in HPI discussed/recommended as applicable.   -Situational stressors,  likely affecting sleep.  Handout given with coping techniques, avoid new medications for now as those may be too sedating for work.  RTC precautions.  Benign essential HTN - Plan: Comprehensive metabolic panel  -Some elevated readings, will increase amlodipine to 10 mg for now.  If doubling 5 mg tolerated can send a new  prescription for that dose..  May be elevated due to situational stressors.   RTC precautions if persistent elevations or new symptoms/side effects.  Should bring monitor to verify accuracy if persistent elevations.  Labs pending.  Hyperlipidemia, unspecified hyperlipidemia type - Plan: Lipid panel  -Tolerating statin, continue same, updated labs ordered  Screening for prostate cancer - Plan: PSA  Screening for diabetes mellitus - Plan: Hemoglobin A1c  Need for hepatitis C screening test - Plan: Hepatitis C antibody  Excessive sweating - Plan: TSH, aluminum chloride (DRYSOL) 20 % external solution  -Soles of feet, check TSH, Drysol ordered with potential side effects discussed.  Routine screening for STI (sexually transmitted infection) - Plan: RPR, HIV Antibody (routine testing w rflx), Urine cytology ancillary only   Meds ordered this encounter  Medications   aluminum chloride (DRYSOL) 20 % external solution    Sig: Apply topically at bedtime to feet as needed.    Dispense:  35 mL    Refill:  0   Patient Instructions  For stress and trouble sleeping, see information below. I do not recommend any new meds for now as those may affect alertness during the day. Please let me know if sleep continues to be difficult.   I do recommend meeting with dentist. Here is an option: [facility] - Dr. Oren Binet Address: [address], [place], [place] [address]  [email or web address]  Phone: [phone]  Okay to increase amlodipine to 10 mg total dosing for now, 2 of the 5 mg pills.  If that dose is tolerated without low readings or lightheadedness/dizziness, let me know and I can send in a new prescription.  Blood pressures in the office were lower than home readings, but if you continue to have significant high readings at home please schedule a repeat visit with your blood pressure machine to make sure it is accurate reading.  For sweating of the feet I will check some blood work, but  can try the new topical treatment Drysol at bedtime.  That can cause some burning.  Let me know if that is not improving.  Hang in there.  Preventive Care 53-61 Years Old, Male Preventive care refers to lifestyle choices and visits with your health care provider that can promote health and wellness. Preventive care visits are also called wellness exams. What can I expect for my preventive care visit? Counseling During your preventive care visit, your health care provider may ask about your: Medical history, including: Past medical problems. Family medical history. Current health, including: Emotional well-being. Home life and relationship well-being. Sexual activity. Lifestyle, including: Alcohol, nicotine or tobacco, and drug use. Access to firearms. Diet, exercise, and sleep habits. Safety issues such as seatbelt and bike helmet use. Sunscreen use. Work and work Statistician. Physical exam Your health care provider will check your: Height and weight. These may be used to calculate your BMI (body mass index). BMI is a measurement that tells if you are at a healthy weight. Waist circumference. This measures the distance around your waistline. This measurement also tells if you are at a healthy weight and may help predict your risk of certain diseases, such as type 2 diabetes  and high blood pressure. Heart rate and blood pressure. Body temperature. Skin for abnormal spots. What immunizations do I need?  Vaccines are usually given at various ages, according to a schedule. Your health care provider will recommend vaccines for you based on your age, medical history, and lifestyle or other factors, such as travel or where you work. What tests do I need? Screening Your health care provider may recommend screening tests for certain conditions. This may include: Lipid and cholesterol levels. Diabetes screening. This is done by checking your blood sugar (glucose) after you have not eaten  for a while (fasting). Hepatitis B test. Hepatitis C test. HIV (human immunodeficiency virus) test. STI (sexually transmitted infection) testing, if you are at risk. Lung cancer screening. Prostate cancer screening. Colorectal cancer screening. Talk with your health care provider about your test results, treatment options, and if necessary, the need for more tests. Follow these instructions at home: Eating and drinking  Eat a diet that includes fresh fruits and vegetables, whole grains, lean protein, and low-fat dairy products. Take vitamin and mineral supplements as recommended by your health care provider. Do not drink alcohol if your health care provider tells you not to drink. If you drink alcohol: Limit how much you have to 0-2 drinks a day. Know how much alcohol is in your drink. In the [place] one drink equals one 12 oz bottle of beer (355 mL), one 5 oz glass of wine (148 mL), or one 1 oz glass of hard liquor (44 mL). Lifestyle Brush your teeth every morning and night with fluoride toothpaste. Floss one time each day. Exercise for at least 30 minutes 5 or more days each week. Do not use any products that contain nicotine or tobacco. These products include cigarettes, chewing tobacco, and vaping devices, such as e-cigarettes. If you need help quitting, ask your health care provider. Do not use drugs. If you are sexually active, practice safe sex. Use a condom or other form of protection to prevent STIs. Take aspirin only as told by your health care provider. Make sure that you understand how much to take and what form to take. Work with your health care provider to find out whether it is safe and beneficial for you to take aspirin daily. Find healthy ways to manage stress, such as: Meditation, yoga, or listening to music. Journaling. Talking to a trusted person. Spending time with friends and family. Minimize exposure to UV radiation to reduce your risk of skin  cancer. Safety Always wear your seat belt while driving or riding in a vehicle. Do not drive: If you have been drinking alcohol. Do not ride with someone who has been drinking. When you are tired or distracted. While texting. If you have been using any mind-altering substances or drugs. Wear a helmet and other protective equipment during sports activities. If you have firearms in your house, make sure you follow all gun safety procedures. What's next? Go to your health care provider once a year for an annual wellness visit. Ask your health care provider how often you should have your eyes and teeth checked. Stay up to date on all vaccines. This information is not intended to replace advice given to you by your health care provider. Make sure you discuss any questions you have with your health care provider. Document Revised: 12/01/2020 Document Reviewed: 12/01/2020 Elsevier Patient Education  [place] [facility], Adult Feeling a certain amount of stress is normal. Stress helps our body and mind  get ready to deal with the demands of life. Stress hormones can motivate you to do well at work and meet your responsibilities. But severe or long-term (chronic) stress can affect your mental and physical health. Chronic stress puts you at higher risk for: Anxiety and depression. Other health problems such as digestive problems, muscle aches, heart disease, high blood pressure, and stroke. What are the causes? Common causes of stress include: Demands from work, such as deadlines, feeling overworked, or having long hours. Pressures at home, such as money issues, disagreements with a spouse, or parenting issues. Pressures from major life changes, such as divorce, moving, loss of a loved one, or chronic illness. You may be at higher risk for stress-related problems if you: Do not get enough sleep. Are in poor health. Do not have emotional support. Have a mental health disorder  such as anxiety or depression. How to recognize stress Stress can make you: Have trouble sleeping. Feel sad, anxious, irritable, or overwhelmed. Lose your appetite. Overeat or want to eat unhealthy foods. Want to use drugs or alcohol. Stress can also cause physical symptoms, such as: Sore, tense muscles, especially in the shoulders and neck. Headaches. Trouble breathing. A faster heart rate. Stomach pain, nausea, or vomiting. Diarrhea or constipation. Trouble concentrating. Follow these instructions at home: Eating and drinking Eat a healthy diet. This includes: Eating foods that are high in fiber, such as beans, whole grains, and fresh fruits and vegetables. Limiting foods that are high in fat and processed sugars, such as fried or sweet foods. Do not skip meals or overeat. Drink enough fluid to keep your urine pale yellow. Alcohol use Do not drink alcohol if: Your health care provider tells you not to drink. You are pregnant, may be pregnant, or are planning to become pregnant. Drinking alcohol is a way some people try to ease their stress. This can be dangerous, so if you drink alcohol: Limit how much you have to: 0-1 drink a day for women. 0-2 drinks a day for men. Know how much alcohol is in your drink. In the [place] one drink equals one 12 oz bottle of beer (355 mL), one 5 oz glass of wine (148 mL), or one 1 oz glass of hard liquor (44 mL). Activity  Include 30 minutes of exercise in your daily schedule. Exercise is a good stress reducer. Include time in your day for an activity that you find relaxing. Try taking a walk, going on a bike ride, reading a book, or listening to music. Schedule your time in a way that lowers stress, and keep a regular schedule. Focus on doing what is most important to get done. Lifestyle Identify the source of your stress and your reaction to it. See a therapist who can help you change unhelpful reactions. When there are stressful  events: Talk about them with family, friends, or coworkers. Try to think realistically about stressful events and not ignore them or overreact. Try to find the positives in a stressful situation and not focus on the negatives. Cut back on responsibilities at work and home, if possible. Ask for help from friends or family members if you need it. Find ways to manage stress, such as: Mindfulness, meditation, or deep breathing. Yoga or tai chi. Progressive muscle relaxation. Spending time in nature. Doing art, playing music, or reading. Making time for fun activities. Spending time with family and friends. Get support from family, friends, or spiritual resources. General instructions Get enough sleep. Try to go to sleep  and get up at about the same time every day. Take over-the-counter and prescription medicines only as told by your health care provider. Do not use any products that contain nicotine or tobacco. These products include cigarettes, chewing tobacco, and vaping devices, such as e-cigarettes. If you need help quitting, ask your health care provider. Do not use drugs or smoke to deal with stress. Keep all follow-up visits. This is important. Where to find support Talk with your health care provider about stress management or finding a support group. Find a therapist to work with you on your stress management techniques. Where to find more information [employer] on Mental Illness: www.nami.org American Psychological Association: TVStereos.ch Contact a health care provider if: Your stress symptoms get worse. You are unable to manage your stress at home. You are struggling to stop using drugs or alcohol. Get help right away if: You may be a danger to yourself or others. You have any thoughts of death or suicide. Get help right awayif you feel like you may hurt yourself or others, or have thoughts about taking your own life. Go to your nearest emergency room or: Call  911. Call the [place] at 515-400-4693 or 988 in the [place] This is open 24 hours a day. Text the Crisis Text Line at 938 733 8180. Summary Feeling a certain amount of stress is normal, but severe or long-term (chronic) stress can affect your mental and physical health. Chronic stress can put you at higher risk for anxiety, depression, and other health problems such as digestive problems, muscle aches, heart disease, high blood pressure, and stroke. You may be at higher risk for stress-related problems if you do not get enough sleep, are in poor health, lack emotional support, or have a mental health disorder such as anxiety or depression. Identify the source of your stress and your reaction to it. Try talking about stressful events with family, friends, or coworkers, finding a coping method, or getting support from spiritual resources. If you need more help, talk with your health care provider about finding a support group or a mental health therapist. This information is not intended to replace advice given to you by your health care provider. Make sure you discuss any questions you have with your health care provider. Document Revised: 12/30/2020 Document Reviewed: 12/28/2020 Elsevier Patient Education  [place].   Insomnia Insomnia is a sleep disorder that makes it difficult to fall asleep or stay asleep. Insomnia can cause fatigue, low energy, difficulty concentrating, mood swings, and poor performance at work or school. There are three different ways to classify insomnia: Difficulty falling asleep. Difficulty staying asleep. Waking up too early in the morning. Any type of insomnia can be long-term (chronic) or short-term (acute). Both are common. Short-term insomnia usually lasts for 3 months or less. Chronic insomnia occurs at least three times a week for longer than 3 months. What are the causes? Insomnia may be caused by another condition, situation, or  substance, such as: Having certain mental health conditions, such as anxiety and depression. Using caffeine, alcohol, tobacco, or drugs. Having gastrointestinal conditions, such as gastroesophageal reflux disease (GERD). Having certain medical conditions. These include: Asthma. Alzheimer's disease. Stroke. Chronic pain. An overactive thyroid gland (hyperthyroidism). Other sleep disorders, such as restless legs syndrome and sleep apnea. Menopause. Sometimes, the cause of insomnia may not be known. What increases the risk? Risk factors for insomnia include: Gender. Females are affected more often than males. Age. Insomnia is more common as  people get older. Stress and certain medical and mental health conditions. Lack of exercise. Having an irregular work schedule. This may include working night shifts and traveling between different time zones. What are the signs or symptoms? If you have insomnia, the main symptom is having trouble falling asleep or having trouble staying asleep. This may lead to other symptoms, such as: Feeling tired or having low energy. Feeling nervous about going to sleep. Not feeling rested in the morning. Having trouble concentrating. Feeling irritable, anxious, or depressed. How is this diagnosed? This condition may be diagnosed based on: Your symptoms and medical history. Your health care provider may ask about: Your sleep habits. Any medical conditions you have. Your mental health. A physical exam. How is this treated? Treatment for insomnia depends on the cause. Treatment may focus on treating an underlying condition that is causing the insomnia. Treatment may also include: Medicines to help you sleep. Counseling or therapy. Lifestyle adjustments to help you sleep better. Follow these instructions at home: Eating and drinking  Limit or avoid alcohol, caffeinated beverages, and products that contain nicotine and tobacco, especially close to bedtime.  These can disrupt your sleep. Do not eat a large meal or eat spicy foods right before bedtime. This can lead to digestive discomfort that can make it hard for you to sleep. Sleep habits  Keep a sleep diary to help you and your health care provider figure out what could be causing your insomnia. Write down: When you sleep. When you wake up during the night. How well you sleep and how rested you feel the next day. Any side effects of medicines you are taking. What you eat and drink. Make your bedroom a dark, comfortable place where it is easy to fall asleep. Put up shades or blackout curtains to block light from outside. Use a white noise machine to block noise. Keep the temperature cool. Limit screen use before bedtime. This includes: Not watching TV. Not using your smartphone, tablet, or computer. Stick to a routine that includes going to bed and waking up at the same times every day and night. This can help you fall asleep faster. Consider making a quiet activity, such as reading, part of your nighttime routine. Try to avoid taking naps during the day so that you sleep better at night. Get out of bed if you are still awake after 15 minutes of trying to sleep. Keep the lights down, but try reading or doing a quiet activity. When you feel sleepy, go back to bed. General instructions Take over-the-counter and prescription medicines only as told by your health care provider. Exercise regularly as told by your health care provider. However, avoid exercising in the hours right before bedtime. Use relaxation techniques to manage stress. Ask your health care provider to suggest some techniques that may work well for you. These may include: Breathing exercises. Routines to release muscle tension. Visualizing peaceful scenes. Make sure that you drive carefully. Do not drive if you feel very sleepy. Keep all follow-up visits. This is important. Contact a health care provider if: You are tired  throughout the day. You have trouble in your daily routine due to sleepiness. You continue to have sleep problems, or your sleep problems get worse. Get help right away if: You have thoughts about hurting yourself or someone else. Get help right away if you feel like you may hurt yourself or others, or have thoughts about taking your own life. Go to your nearest emergency room or: Call  911. Call the [facility] at [phone] or 988. This is open 24 hours a day. Text the Crisis Text Line at 5072107755. Summary Insomnia is a sleep disorder that makes it difficult to fall asleep or stay asleep. Insomnia can be long-term (chronic) or short-term (acute). Treatment for insomnia depends on the cause. Treatment may focus on treating an underlying condition that is causing the insomnia. Keep a sleep diary to help you and your health care provider figure out what could be causing your insomnia. This information is not intended to replace advice given to you by your health care provider. Make sure you discuss any questions you have with your health care provider. Document Revised: 05/16/2021 Document Reviewed: 05/16/2021 Elsevier Patient Education  2023 [place],   Merri Ray, MD [place] 01/04/22 4:41 PM

## 2022-01-04 NOTE — Patient Instructions (Addendum)
For stress and trouble sleeping, see information below. I do not recommend any new meds for now as those may affect alertness during the day. Please let me know if sleep continues to be difficult.   I do recommend meeting with dentist. Here is an option: [facility] - Dr. Oren Binet Address: [address], [place], [place] [address]  [email or web address]  Phone: [phone]  Okay to increase amlodipine to 10 mg total dosing for now, 2 of the 5 mg pills.  If that dose is tolerated without low readings or lightheadedness/dizziness, let me know and I can send in a new prescription.  Blood pressures in the office were lower than home readings, but if you continue to have significant high readings at home please schedule a repeat visit with your blood pressure machine to make sure it is accurate reading.  For sweating of the feet I will check some blood work, but can try the new topical treatment Drysol at bedtime.  That can cause some burning.  Let me know if that is not improving.  Hang in there.  Preventive Care 56-66 Years Old, Male Preventive care refers to lifestyle choices and visits with your health care provider that can promote health and wellness. Preventive care visits are also called wellness exams. What can I expect for my preventive care visit? Counseling During your preventive care visit, your health care provider may ask about your: Medical history, including: Past medical problems. Family medical history. Current health, including: Emotional well-being. Home life and relationship well-being. Sexual activity. Lifestyle, including: Alcohol, nicotine or tobacco, and drug use. Access to firearms. Diet, exercise, and sleep habits. Safety issues such as seatbelt and bike helmet use. Sunscreen use. Work and work Statistician. Physical exam Your health care provider will check your: Height and weight. These may be used to calculate your BMI (body mass index). BMI is a  measurement that tells if you are at a healthy weight. Waist circumference. This measures the distance around your waistline. This measurement also tells if you are at a healthy weight and may help predict your risk of certain diseases, such as type 2 diabetes and high blood pressure. Heart rate and blood pressure. Body temperature. Skin for abnormal spots. What immunizations do I need?  Vaccines are usually given at various ages, according to a schedule. Your health care provider will recommend vaccines for you based on your age, medical history, and lifestyle or other factors, such as travel or where you work. What tests do I need? Screening Your health care provider may recommend screening tests for certain conditions. This may include: Lipid and cholesterol levels. Diabetes screening. This is done by checking your blood sugar (glucose) after you have not eaten for a while (fasting). Hepatitis B test. Hepatitis C test. HIV (human immunodeficiency virus) test. STI (sexually transmitted infection) testing, if you are at risk. Lung cancer screening. Prostate cancer screening. Colorectal cancer screening. Talk with your health care provider about your test results, treatment options, and if necessary, the need for more tests. Follow these instructions at home: Eating and drinking  Eat a diet that includes fresh fruits and vegetables, whole grains, lean protein, and low-fat dairy products. Take vitamin and mineral supplements as recommended by your health care provider. Do not drink alcohol if your health care provider tells you not to drink. If you drink alcohol: Limit how much you have to 0-2 drinks a day. Know how much alcohol is in your drink. In the [place] one drink equals  one 12 oz bottle of beer (355 mL), one 5 oz glass of wine (148 mL), or one 1 oz glass of hard liquor (44 mL). Lifestyle Brush your teeth every morning and night with fluoride toothpaste. Floss one time each  day. Exercise for at least 30 minutes 5 or more days each week. Do not use any products that contain nicotine or tobacco. These products include cigarettes, chewing tobacco, and vaping devices, such as e-cigarettes. If you need help quitting, ask your health care provider. Do not use drugs. If you are sexually active, practice safe sex. Use a condom or other form of protection to prevent STIs. Take aspirin only as told by your health care provider. Make sure that you understand how much to take and what form to take. Work with your health care provider to find out whether it is safe and beneficial for you to take aspirin daily. Find healthy ways to manage stress, such as: Meditation, yoga, or listening to music. Journaling. Talking to a trusted person. Spending time with friends and family. Minimize exposure to UV radiation to reduce your risk of skin cancer. Safety Always wear your seat belt while driving or riding in a vehicle. Do not drive: If you have been drinking alcohol. Do not ride with someone who has been drinking. When you are tired or distracted. While texting. If you have been using any mind-altering substances or drugs. Wear a helmet and other protective equipment during sports activities. If you have firearms in your house, make sure you follow all gun safety procedures. What's next? Go to your health care provider once a year for an annual wellness visit. Ask your health care provider how often you should have your eyes and teeth checked. Stay up to date on all vaccines. This information is not intended to replace advice given to you by your health care provider. Make sure you discuss any questions you have with your health care provider. Document Revised: 12/01/2020 Document Reviewed: 12/01/2020 Elsevier Patient Education  [place], Adult Feeling a certain amount of stress is normal. Stress helps our body and mind get ready to deal with the  demands of life. Stress hormones can motivate you to do well at work and meet your responsibilities. But severe or long-term (chronic) stress can affect your mental and physical health. Chronic stress puts you at higher risk for: Anxiety and depression. Other health problems such as digestive problems, muscle aches, heart disease, high blood pressure, and stroke. What are the causes? Common causes of stress include: Demands from work, such as deadlines, feeling overworked, or having long hours. Pressures at home, such as money issues, disagreements with a spouse, or parenting issues. Pressures from major life changes, such as divorce, moving, loss of a loved one, or chronic illness. You may be at higher risk for stress-related problems if you: Do not get enough sleep. Are in poor health. Do not have emotional support. Have a mental health disorder such as anxiety or depression. How to recognize stress Stress can make you: Have trouble sleeping. Feel sad, anxious, irritable, or overwhelmed. Lose your appetite. Overeat or want to eat unhealthy foods. Want to use drugs or alcohol. Stress can also cause physical symptoms, such as: Sore, tense muscles, especially in the shoulders and neck. Headaches. Trouble breathing. A faster heart rate. Stomach pain, nausea, or vomiting. Diarrhea or constipation. Trouble concentrating. Follow these instructions at home: Eating and drinking Eat a healthy diet. This includes: Eating foods  that are high in fiber, such as beans, whole grains, and fresh fruits and vegetables. Limiting foods that are high in fat and processed sugars, such as fried or sweet foods. Do not skip meals or overeat. Drink enough fluid to keep your urine pale yellow. Alcohol use Do not drink alcohol if: Your health care provider tells you not to drink. You are pregnant, may be pregnant, or are planning to become pregnant. Drinking alcohol is a way some people try to ease their  stress. This can be dangerous, so if you drink alcohol: Limit how much you have to: 0-1 drink a day for women. 0-2 drinks a day for men. Know how much alcohol is in your drink. In the [place] one drink equals one 12 oz bottle of beer (355 mL), one 5 oz glass of wine (148 mL), or one 1 oz glass of hard liquor (44 mL). Activity  Include 30 minutes of exercise in your daily schedule. Exercise is a good stress reducer. Include time in your day for an activity that you find relaxing. Try taking a walk, going on a bike ride, reading a book, or listening to music. Schedule your time in a way that lowers stress, and keep a regular schedule. Focus on doing what is most important to get done. Lifestyle Identify the source of your stress and your reaction to it. See a therapist who can help you change unhelpful reactions. When there are stressful events: Talk about them with family, friends, or coworkers. Try to think realistically about stressful events and not ignore them or overreact. Try to find the positives in a stressful situation and not focus on the negatives. Cut back on responsibilities at work and home, if possible. Ask for help from friends or family members if you need it. Find ways to manage stress, such as: Mindfulness, meditation, or deep breathing. Yoga or tai chi. Progressive muscle relaxation. Spending time in nature. Doing art, playing music, or reading. Making time for fun activities. Spending time with family and friends. Get support from family, friends, or spiritual resources. General instructions Get enough sleep. Try to go to sleep and get up at about the same time every day. Take over-the-counter and prescription medicines only as told by your health care provider. Do not use any products that contain nicotine or tobacco. These products include cigarettes, chewing tobacco, and vaping devices, such as e-cigarettes. If you need help quitting, ask your health care  provider. Do not use drugs or smoke to deal with stress. Keep all follow-up visits. This is important. Where to find support Talk with your health care provider about stress management or finding a support group. Find a therapist to work with you on your stress management techniques. Where to find more information [employer] on Mental Illness: www.nami.org American Psychological Association: TV[employer].ch Contact a health care provider if: Your stress symptoms get worse. You are unable to manage your stress at home. You are struggling to stop using drugs or alcohol. Get help right away if: You may be a danger to yourself or others. You have any thoughts of death or suicide. Get help right awayif you feel like you may hurt yourself or others, or have thoughts about taking your own life. Go to your nearest emergency room or: Call 911. Call the [facility] at (320)188-2875 or 988 in the [place] This is open 24 hours a day. Text the Crisis Text Line at 4806214067. Summary Feeling a certain amount of stress is  normal, but severe or long-term (chronic) stress can affect your mental and physical health. Chronic stress can put you at higher risk for anxiety, depression, and other health problems such as digestive problems, muscle aches, heart disease, high blood pressure, and stroke. You may be at higher risk for stress-related problems if you do not get enough sleep, are in poor health, lack emotional support, or have a mental health disorder such as anxiety or depression. Identify the source of your stress and your reaction to it. Try talking about stressful events with family, friends, or coworkers, finding a coping method, or getting support from spiritual resources. If you need more help, talk with your health care provider about finding a support group or a mental health therapist. This information is not intended to replace advice given to you by your health care  provider. Make sure you discuss any questions you have with your health care provider. Document Revised: 12/30/2020 Document Reviewed: 12/28/2020 Elsevier Patient Education  [address].   Insomnia Insomnia is a sleep disorder that makes it difficult to fall asleep or stay asleep. Insomnia can cause fatigue, low energy, difficulty concentrating, mood swings, and poor performance at work or school. There are three different ways to classify insomnia: Difficulty falling asleep. Difficulty staying asleep. Waking up too early in the morning. Any type of insomnia can be long-term (chronic) or short-term (acute). Both are common. Short-term insomnia usually lasts for 3 months or less. Chronic insomnia occurs at least three times a week for longer than 3 months. What are the causes? Insomnia may be caused by another condition, situation, or substance, such as: Having certain mental health conditions, such as anxiety and depression. Using caffeine, alcohol, tobacco, or drugs. Having gastrointestinal conditions, such as gastroesophageal reflux disease (GERD). Having certain medical conditions. These include: Asthma. Alzheimer's disease. Stroke. Chronic pain. An overactive thyroid gland (hyperthyroidism). Other sleep disorders, such as restless legs syndrome and sleep apnea. Menopause. Sometimes, the cause of insomnia may not be known. What increases the risk? Risk factors for insomnia include: Gender. Females are affected more often than males. Age. Insomnia is more common as people get older. Stress and certain medical and mental health conditions. Lack of exercise. Having an irregular work schedule. This may include working night shifts and traveling between different time zones. What are the signs or symptoms? If you have insomnia, the main symptom is having trouble falling asleep or having trouble staying asleep. This may lead to other symptoms, such as: Feeling tired or having  low energy. Feeling nervous about going to sleep. Not feeling rested in the morning. Having trouble concentrating. Feeling irritable, anxious, or depressed. How is this diagnosed? This condition may be diagnosed based on: Your symptoms and medical history. Your health care provider may ask about: Your sleep habits. Any medical conditions you have. Your mental health. A physical exam. How is this treated? Treatment for insomnia depends on the cause. Treatment may focus on treating an underlying condition that is causing the insomnia. Treatment may also include: Medicines to help you sleep. Counseling or therapy. Lifestyle adjustments to help you sleep better. Follow these instructions at home: Eating and drinking  Limit or avoid alcohol, caffeinated beverages, and products that contain nicotine and tobacco, especially close to bedtime. These can disrupt your sleep. Do not eat a large meal or eat spicy foods right before bedtime. This can lead to digestive discomfort that can make it hard for you to sleep. Sleep habits  Keep a sleep  diary to help you and your health care provider figure out what could be causing your insomnia. Write down: When you sleep. When you wake up during the night. How well you sleep and how rested you feel the next day. Any side effects of medicines you are taking. What you eat and drink. Make your bedroom a dark, comfortable place where it is easy to fall asleep. Put up shades or blackout curtains to block light from outside. Use a white noise machine to block noise. Keep the temperature cool. Limit screen use before bedtime. This includes: Not watching TV. Not using your smartphone, tablet, or computer. Stick to a routine that includes going to bed and waking up at the same times every day and night. This can help you fall asleep faster. Consider making a quiet activity, such as reading, part of your nighttime routine. Try to avoid taking naps during the  day so that you sleep better at night. Get out of bed if you are still awake after 15 minutes of trying to sleep. Keep the lights down, but try reading or doing a quiet activity. When you feel sleepy, go back to bed. General instructions Take over-the-counter and prescription medicines only as told by your health care provider. Exercise regularly as told by your health care provider. However, avoid exercising in the hours right before bedtime. Use relaxation techniques to manage stress. Ask your health care provider to suggest some techniques that may work well for you. These may include: Breathing exercises. Routines to release muscle tension. Visualizing peaceful scenes. Make sure that you drive carefully. Do not drive if you feel very sleepy. Keep all follow-up visits. This is important. Contact a health care provider if: You are tired throughout the day. You have trouble in your daily routine due to sleepiness. You continue to have sleep problems, or your sleep problems get worse. Get help right away if: You have thoughts about hurting yourself or someone else. Get help right away if you feel like you may hurt yourself or others, or have thoughts about taking your own life. Go to your nearest emergency room or: Call 911. Call the [facility] at (757)561-2372 or 988. This is open 24 hours a day. Text the Crisis Text Line at 321 178 6389. Summary Insomnia is a sleep disorder that makes it difficult to fall asleep or stay asleep. Insomnia can be long-term (chronic) or short-term (acute). Treatment for insomnia depends on the cause. Treatment may focus on treating an underlying condition that is causing the insomnia. Keep a sleep diary to help you and your health care provider figure out what could be causing your insomnia. This information is not intended to replace advice given to you by your health care provider. Make sure you discuss any questions you have with your  health care provider. Document Revised: 05/16/2021 Document Reviewed: 05/16/2021 Elsevier Patient Education  [place].

## 2022-01-05 LAB — HEPATITIS C ANTIBODY: Hepatitis C Ab: NONREACTIVE

## 2022-01-05 LAB — HEMOGLOBIN A1C: Hgb A1c MFr Bld: 5.6 % (ref 4.6–6.5)

## 2022-01-05 LAB — COMPREHENSIVE METABOLIC PANEL
ALT: 14 U/L (ref 0–53)
AST: 21 U/L (ref 0–37)
Albumin: 4.4 g/dL (ref 3.5–5.2)
Alkaline Phosphatase: 52 U/L (ref 39–117)
BUN: 22 mg/dL (ref 6–23)
CO2: 28 mEq/L (ref 19–32)
Calcium: 9.9 mg/dL (ref 8.4–10.5)
Chloride: 106 mEq/L (ref 96–112)
Creatinine, Ser: 1.52 mg/dL — ABNORMAL HIGH (ref 0.40–1.50)
GFR: 51.21 mL/min — ABNORMAL LOW (ref >60.00)
Glucose, Bld: 89 mg/dL (ref 70–99)
Potassium: 4.6 mEq/L (ref 3.5–5.1)
Sodium: 141 mEq/L (ref 135–145)
Total Bilirubin: 0.6 mg/dL (ref 0.2–1.2)
Total Protein: 7.3 g/dL (ref 6.0–8.3)

## 2022-01-05 LAB — LIPID PANEL
Cholesterol: 150 mg/dL (ref 0–200)
HDL: 53.4 mg/dL (ref >39.00)
LDL Cholesterol: 78 mg/dL (ref 0–99)
NonHDL: 96.38
Total CHOL/HDL Ratio: 3
Triglycerides: 92 mg/dL (ref 0.0–149.0)
VLDL: 18.4 mg/dL (ref 0.0–40.0)

## 2022-01-05 LAB — HIV ANTIBODY (ROUTINE TESTING W REFLEX): HIV 1&2 Ab, 4th Generation: NONREACTIVE

## 2022-01-05 LAB — TSH: TSH: 1.8 u[IU]/mL (ref 0.35–5.50)

## 2022-01-05 LAB — PSA: PSA: 0.26 ng/mL (ref 0.10–4.00)

## 2022-01-05 LAB — RPR: RPR Ser Ql: NONREACTIVE

## 2022-01-06 ENCOUNTER — Other Ambulatory Visit (HOSPITAL_COMMUNITY): Payer: Self-pay

## 2022-01-06 ENCOUNTER — Encounter: Payer: Self-pay | Admitting: Gastroenterology

## 2022-01-09 ENCOUNTER — Other Ambulatory Visit (HOSPITAL_COMMUNITY): Payer: Self-pay

## 2022-01-09 LAB — URINE CYTOLOGY ANCILLARY ONLY
Chlamydia: NEGATIVE
Comment: NEGATIVE
Comment: NEGATIVE
Comment: NORMAL
Neisseria Gonorrhea: NEGATIVE
Trichomonas: NEGATIVE

## 2022-01-11 ENCOUNTER — Ambulatory Visit (AMBULATORY_SURGERY_CENTER): Payer: No Typology Code available for payment source | Admitting: Gastroenterology

## 2022-01-11 ENCOUNTER — Encounter: Payer: Self-pay | Admitting: Gastroenterology

## 2022-01-11 VITALS — BP 159/88 | HR 40 | Temp 97.8°F | Resp 11 | Ht 72.0 in | Wt 235.0 lb

## 2022-01-11 DIAGNOSIS — Z86010 Personal history of colonic polyps: Secondary | ICD-10-CM | POA: Diagnosis not present

## 2022-01-11 DIAGNOSIS — Z09 Encounter for follow-up examination after completed treatment for conditions other than malignant neoplasm: Secondary | ICD-10-CM

## 2022-01-11 DIAGNOSIS — Z1211 Encounter for screening for malignant neoplasm of colon: Secondary | ICD-10-CM

## 2022-01-11 MED ORDER — SODIUM CHLORIDE 0.9 % IV SOLN: 500.0000 mL | Freq: Once | INTRAVENOUS | Status: DC

## 2022-01-11 NOTE — Progress Notes (Signed)
Pt states "I usually have a low pulse"  Cell phone and [employer] watch off per pt  Pt's states no medical or surgical changes since previsit or office visit.

## 2022-01-11 NOTE — Op Note (Signed)
[facility] Patient Name: Raymond Green Procedure Date: 01/11/2022 10:27 AM MRN: [id number] Endoscopist: [name] , MD Age: 56 Referring MD:  Date of Birth: 1966-04-16 Gender: Male Account #: [id number] Procedure:                Colonoscopy Indications:              Screening for colorectal malignant neoplasm;                            Colonoscopy Dr. Christella Hartigan 2013 was normal. Colonoscopy                            Dr. Madilyn Fireman 2016 single subCM polyp that was not                            precancerous Medicines:                Monitored Anesthesia Care Procedure:                Pre-Anesthesia Assessment:                           - Prior to the procedure, a History and Physical                            was performed, and patient medications and                            allergies were reviewed. The patient's tolerance of                            previous anesthesia was also reviewed. The risks                            and benefits of the procedure and the sedation                            options and risks were discussed with the patient.                            All questions were answered, and informed consent                            was obtained. Prior Anticoagulants: The patient has                            taken no previous anticoagulant or antiplatelet                            agents. ASA Grade Assessment: II - A patient with                            mild systemic disease. After reviewing the risks  and benefits, the patient was deemed in                            satisfactory condition to undergo the procedure.                           After obtaining informed consent, the colonoscope                            was passed under direct vision. Throughout the                            procedure, the patient's blood pressure, pulse, and                            oxygen saturations were monitored continuously. The                             CF [id number] was introduced through the anus                            and advanced to the the cecum, identified by                            appendiceal orifice and ileocecal valve. The                            colonoscopy was performed without difficulty. The                            patient tolerated the procedure well. The quality                            of the bowel preparation was good. The ileocecal                            valve, appendiceal orifice, and rectum were                            photographed. Scope In: 10:32:12 AM Scope Out: 10:42:07 AM Scope Withdrawal Time: 0 hours 6 minutes 9 seconds  Total Procedure Duration: 0 hours 9 minutes 55 seconds  Findings:                 The entire examined colon appeared normal on direct                            and retroflexion views. Complications:            No immediate complications. Estimated blood loss:                            None. Estimated Blood Loss:     Estimated blood loss: none. Impression:               - The entire examined colon  is normal on direct and                            retroflexion views.                           - No polyps or cancers. Recommendation:           - Patient has a contact number available for                            emergencies. The signs and symptoms of potential                            delayed complications were discussed with the                            patient. Return to normal activities tomorrow.                            Written discharge instructions were provided to the                            patient.                           - Resume previous diet.                           - Continue present medications.                           - Repeat colonoscopy in 10 years for screening. [name], MD 01/11/2022 10:45:43 AM This report has been signed electronically.

## 2022-01-11 NOTE — Progress Notes (Signed)
HPI: This is a man at routine risk for [facility]  Colonoscopy Dr. Christella Hartigan 2013 was normal. Colonoscopy Dr. Madilyn Fireman 2016 single subCM polyp that was not precancerous.  ROS: complete GI ROS as described in HPI, all other review negative.  Constitutional:  No unintentional weight loss   Past Medical History:  Diagnosis Date   Allergy    Anemia    Erosive gastritis 2016   GERD (gastroesophageal reflux disease)    Headache(784.0)    Hemorrhoids    Hyperlipidemia    Hypertension    Kidney insufficiency    LVH (left ventricular hypertrophy)    Dr. Donnie Aho ( cardiology) palpitations   Obesity    OSA (obstructive sleep apnea)    Positive PPD, treated 2000   INH   Sleep apnea    uses cpap   Vitamin D deficiency     Past Surgical History:  Procedure Laterality Date   COLONOSCOPY  12/28/2011   Procedure: COLONOSCOPY;  Surgeon: [name], MD;  Location: [facility];  Service: Endoscopy;  Laterality: N/A;   HEMORRHOID SURGERY     UPPER GASTROINTESTINAL ENDOSCOPY      Current Outpatient Medications  Medication Sig Dispense Refill   amLODipine (NORVASC) 5 MG tablet Take 1 tablet (5 mg total) by mouth daily. 90 tablet 1   atorvastatin (LIPITOR) 40 MG tablet Take 1 tablet by mouth daily 90 tablet 3   losartan (COZAAR) 100 MG tablet Take 1 tablet (100 mg total) by mouth daily. 90 tablet 1   Nebivolol HCl (BYSTOLIC) 20 MG TABS Take 1 tablet (20 mg total) by mouth daily. 90 tablet 1   omeprazole (PRILOSEC) 40 MG capsule Take 1 capsule (40 mg total) by mouth daily. 90 capsule 1   spironolactone (ALDACTONE) 50 MG tablet Take 1 tablet by mouth daily 90 tablet 3   aluminum chloride (DRYSOL) 20 % external solution Apply topically at bedtime to feet as needed. 35 mL 0   azithromycin (ZITHROMAX) 500 MG tablet Take 1 tablet (500 mg total) by mouth daily. If signs or symptoms of travelers diarrhea. (Patient not taking: Reported on 01/11/2022) 3 tablet 0   doxycycline (VIBRA-TABS) 100 MG tablet Take 1  tablet (100 mg total) by mouth daily. (Patient not taking: Reported on 12/14/2021) 50 tablet 0   Current Facility-Administered Medications  Medication Dose Route Frequency Provider Last Rate Last Admin   0.9 %  sodium chloride infusion  500 mL Intravenous Once [name], MD        Allergies as of 01/11/2022 - Review Complete 01/11/2022  Allergen Reaction Noted   Lisinopril  08/25/2011   Shrimp [shellfish allergy] Nausea And Vomiting 05/02/2016    Family History  Problem Relation Age of Onset   Diabetes Mother    Heart disease Mother    Hypertension Mother    Hypertension Father    Diabetes Sister    Colon cancer Neg Hx    Colon polyps Neg Hx    Esophageal cancer Neg Hx    Rectal cancer Neg Hx    Stomach cancer Neg Hx     Social History   Socioeconomic History   Marital status: Married    Spouse name: Not on file   Number of children: 0   Years of education: Not on file   Highest education level: Not on file  Occupational History   Occupation: ENDOSCOPY TECHNICIAN    Employer: [employer]  Tobacco Use   Smoking status: Never   Smokeless tobacco: Never  Vaping Use   Vaping Use: Never used  Substance and Sexual Activity   Alcohol use: No    Alcohol/week: 0.0 standard drinks of alcohol   Drug use: No   Sexual activity: Yes  Other Topics Concern   Not on file  Social History Narrative   Married   Education: Automotive engineer   Exercise: Yes   Social Determinants of Health   Financial Resource Strain: Not on file  Food Insecurity: Not on file  Transportation Needs: Not on file  Physical Activity: Not on file  Stress: Not on file  Social Connections: Not on file  Intimate Partner Violence: Not on file     Physical Exam: BP (!) 166/84   Pulse (!) 48   Temp 97.8 F (36.6 C)   Ht 6' (1.829 m)   Wt 235 lb (106.6 kg)   SpO2 100%   BMI 31.87 kg/m  Constitutional: generally well-appearing Psychiatric: alert and oriented x3 Lungs: CTA bilaterally Heart: no  MCR  Assessment and plan: 56 y.o. male with routine risk for CRC   Screening colonoscopy today.  Care is appropriate for the ambulatory setting.  Rob Bunting, MD [facility] 01/11/2022, 10:24 AM

## 2022-01-11 NOTE — Patient Instructions (Signed)
YOU HAD AN ENDOSCOPIC PROCEDURE TODAY AT THE [facility]:   Refer to the procedure report that was given to you for any specific questions about what was found during the examination.  If the procedure report does not answer your questions, please call your gastroenterologist to clarify.  If you requested that your care partner not be given the details of your procedure findings, then the procedure report has been included in a sealed envelope for you to review at your convenience later.  YOU SHOULD EXPECT: Some feelings of bloating in the abdomen. Passage of more gas than usual.  Walking can help get rid of the air that was put into your GI tract during the procedure and reduce the bloating. If you had a lower endoscopy (such as a colonoscopy or flexible sigmoidoscopy) you may notice spotting of blood in your stool or on the toilet paper. If you underwent a bowel prep for your procedure, you may not have a normal bowel movement for a few days.  Please Note:  You might notice some irritation and congestion in your nose or some drainage.  This is from the oxygen used during your procedure.  There is no need for concern and it should clear up in a day or so.  SYMPTOMS TO REPORT IMMEDIATELY:  Following lower endoscopy (colonoscopy or flexible sigmoidoscopy):  Excessive amounts of blood in the stool  Significant tenderness or worsening of abdominal pains  Swelling of the abdomen that is new, acute  Fever of 100F or higher  Following upper endoscopy (EGD)  Vomiting of blood or coffee ground material  New chest pain or pain under the shoulder blades  Painful or persistently difficult swallowing  New shortness of breath  Fever of 100F or higher  Black, tarry-looking stools  For urgent or emergent issues, a gastroenterologist can be reached at any hour by calling [phone]. Do not use MyChart messaging for urgent concerns.    DIET:  We do recommend a small meal at first, but  then you may proceed to your regular diet.  Drink plenty of fluids but you should avoid alcoholic beverages for 24 hours.  ACTIVITY:  You should plan to take it easy for the rest of today and you should NOT DRIVE or use heavy machinery until tomorrow (because of the sedation medicines used during the test).    FOLLOW UP: Our staff will call the number listed on your records the next business day following your procedure.  We will call around 7:15- 8:00 am to check on you and address any questions or concerns that you may have regarding the information given to you following your procedure. If we do not reach you, we will leave a message.  If you develop any symptoms (ie: fever, flu-like symptoms, shortness of breath, cough etc.) before then, please call [phone].  If you test positive for Covid 19 in the 2 weeks post procedure, please call and report this information to us.    If any biopsies were taken you will be contacted by phone or by letter within the next 1-3 weeks.  Please call us at [phone] if you have not heard about the biopsies in 3 weeks.    SIGNATURES/CONFIDENTIALITY: You and/or your care partner have signed paperwork which will be entered into your electronic medical record.  These signatures attest to the fact that that the information above on your After Visit Summary has been reviewed and is understood.  Full responsibility of the confidentiality   of this discharge information lies with you and/or your care-partner.  

## 2022-01-11 NOTE — Progress Notes (Signed)
To pacu, VSS. Report to [email or web address] 

## 2022-01-12 ENCOUNTER — Telehealth: Payer: Self-pay | Admitting: *Deleted

## 2022-01-12 NOTE — Telephone Encounter (Signed)
  Follow up Call-     01/11/2022   10:04 AM  Call back number  Post procedure Call Back phone  # [phone]  Permission to leave phone message Yes     Patient questions:  Do you have a fever, pain , or abdominal swelling? No. Pain Score  0 *  Have you tolerated food without any problems? Yes.    Have you been able to return to your normal activities? Yes.    Do you have any questions about your discharge instructions: Diet   No. Medications  No. Follow up visit  No.  Do you have questions or concerns about your Care? No.  Actions: * If pain score is 4 or above: No action needed, pain <4.

## 2022-02-16 ENCOUNTER — Other Ambulatory Visit (HOSPITAL_COMMUNITY): Payer: Self-pay

## 2022-03-01 ENCOUNTER — Other Ambulatory Visit: Payer: Self-pay | Admitting: Family Medicine

## 2022-03-01 ENCOUNTER — Other Ambulatory Visit (HOSPITAL_COMMUNITY): Payer: Self-pay

## 2022-03-01 DIAGNOSIS — I10 Essential (primary) hypertension: Secondary | ICD-10-CM

## 2022-03-01 DIAGNOSIS — K219 Gastro-esophageal reflux disease without esophagitis: Secondary | ICD-10-CM

## 2022-03-01 MED ORDER — LOSARTAN POTASSIUM 100 MG PO TABS
100.0000 mg | ORAL_TABLET | Freq: Every day | ORAL | 1 refills | Status: DC
  Filled 2022-03-01: qty 90, 90d supply, fill #0
  Filled 2022-05-25: qty 90, 90d supply, fill #1

## 2022-03-01 MED ORDER — AMLODIPINE BESYLATE 5 MG PO TABS
5.0000 mg | ORAL_TABLET | Freq: Every day | ORAL | 1 refills | Status: DC
  Filled 2022-03-01: qty 90, 90d supply, fill #0
  Filled 2022-05-25: qty 90, 90d supply, fill #1

## 2022-03-01 MED ORDER — OMEPRAZOLE 40 MG PO CPDR
40.0000 mg | DELAYED_RELEASE_CAPSULE | Freq: Every day | ORAL | 1 refills | Status: DC
  Filled 2022-03-01: qty 90, 90d supply, fill #0
  Filled 2022-05-25: qty 90, 90d supply, fill #1

## 2022-03-01 MED ORDER — NEBIVOLOL HCL 20 MG PO TABS
1.0000 | ORAL_TABLET | Freq: Every day | ORAL | 1 refills | Status: DC
  Filled 2022-03-01: qty 90, 90d supply, fill #0
  Filled 2022-05-25: qty 90, 90d supply, fill #1

## 2022-03-13 ENCOUNTER — Ambulatory Visit: Payer: No Typology Code available for payment source | Admitting: Family Medicine

## 2022-03-22 ENCOUNTER — Ambulatory Visit (INDEPENDENT_AMBULATORY_CARE_PROVIDER_SITE_OTHER): Payer: No Typology Code available for payment source | Admitting: Family Medicine

## 2022-03-22 ENCOUNTER — Encounter: Payer: Self-pay | Admitting: Family Medicine

## 2022-03-22 VITALS — BP 138/80 | HR 46 | Temp 98.1°F | Ht 72.0 in | Wt 230.0 lb

## 2022-03-22 DIAGNOSIS — K625 Hemorrhage of anus and rectum: Secondary | ICD-10-CM | POA: Diagnosis not present

## 2022-03-22 NOTE — Patient Instructions (Signed)
I will refer you to gastroenterology but previous bright red blood could have been hemorrhoid especially as you are straining at that time for anal fissure.  See information below.  If any return of symptoms, return for recheck right away, but unlikely to occur.  Stool softener if needed, and fiber in the diet can help prevent constipation or hard stools.  Return to the clinic or go to the nearest emergency room if any of your symptoms worsen or new symptoms occur.  Rectal Bleeding  Rectal bleeding is when blood passes out of the opening between the buttocks (anus). People with rectal bleeding may notice bright red blood in their underwear or in the toilet after having a bowel movement. They may also have blood mixed with their stool (feces), or dark red or black stools. Rectal bleeding is usually a sign that something is wrong. Many things can cause rectal bleeding, including: Diverticulosis. This is a condition in which pockets or sacs project from the bowel. Hemorrhoids. These are blood vessels around the anus or inside the rectum that are larger than normal. Anal fissures. This is a tear in the anus. Proctitis and colitis. These are conditions in which the rectum, colon, or anus become inflamed. Polyps. These are growths that can be cancerous (malignant) or noncancerous (benign). Infections of the intestines. Fistulas. These are abnormal openings in the rectum and anus. Rectal prolapse. This is when a part of the rectum sticks out from the anus. Follow these instructions at home: Pay attention to any changes in your symptoms. Take these actions to help reduce bleeding and discomfort: Medicines Take over-the-counter and prescription medicines only as told by your health care provider. Ask your health care provider about changing or stopping your regular medicines or supplements. This is especially important if you are taking blood thinners. Medicines that thin the blood can make rectal  bleeding worse. Managing constipation Your condition may cause constipation. To prevent or treat constipation, or to help make your stools soft, you may need to: Drink enough fluid to keep your urine pale yellow. Take over-the-counter or prescription medicines. Eat foods that are high in fiber, such as beans, whole grains, and fresh fruits and vegetables. Ask your health care provider if you need a supplement to give you more fiber. Limit foods that are high in fat and processed sugars, such as fried or sweet foods.  General instructions Try not to strain when having a bowel movement. Try taking a warm bath. This may help to soothe any pain in your rectum. Keep all follow-up visits as told by your health care provider. This is important. Contact a health care provider if you: Have pain or tenderness in your abdomen. Have a fever. Have weakness. Have nausea. Cannot have a bowel movement. Get help right away if you have: New or increased rectal bleeding. Black or dark red stools. Vomit with blood or something that looks like coffee grounds. A fainting episode. Severe pain in your rectum. Summary Rectal bleeding is usually a sign that something is wrong. This condition should be evaluated by a health care provider. Eat a diet that is high in fiber. This will help keep your stools soft, making it easier to pass stools without straining. Medicines that thin the blood can make rectal bleeding worse. Get help right away if you have new or increased rectal bleeding, black or dark red stools, blood in your vomit, an episode of fainting, or severe pain in your rectum. This information is not intended  to replace advice given to you by your health care provider. Make sure you discuss any questions you have with your health care provider. Document Revised: 05/07/2019 Document Reviewed: 05/07/2019 Elsevier Patient Education  [place].

## 2022-03-22 NOTE — Progress Notes (Signed)
Subjective:  Patient ID: Raymond Green, male    DOB: 02-20-1966  Age: 56 y.o. MRN: [id number]  CC:  Chief Complaint  Patient presents with   Concerns with blood in stool    Pt states he noticed blood in his stool a few weeks ago, pt states right now he doesn't see blood in his stool    HPI Raymond Green presents for   Blood in stool Noticed BRBPR with straining with BM last month. Bright red blood with wiping and some clots. 2 episodes within 2 days. Hard stools at that time. No treatments, No recurrence. Normal soft BM's since that time and no recurrence.  Some lower abd pain when this occurred. None now but intermittent lower left abdominal pain for awhile. No recent changes. Notes in middle of night at times. See prior notes. Chronic left abdominal pain. CT abd pelvis wihtout concerns in 09/2020. Colonoscopy in July - Dr. Ardis Hughs. "The entire examined colon is normal on direct and retroflexion views.- No polyps or cancers". No recent change in abd pain.   History Patient Active Problem List   Diagnosis Date Noted   Chronic rhinitis 11/23/2020   Deviated septum 11/23/2020   Mild coronary artery disease 02/12/2019   Mixed hyperlipidemia 01/14/2019   Chest pain 01/13/2019   Palpitations 01/13/2019   Low back pain 07/13/2016   Abdominal pain, left lower quadrant 05/17/2016   Elevated serum creatinine 05/29/2015   Gout 10/29/2013   Essential hypertension 03/02/2008   GERD 03/02/2008   Iron deficiency anemia 12/13/2007   HEMORRHOIDS-EXTERNAL 12/13/2007   Past Medical History:  Diagnosis Date   Allergy    Anemia    Erosive gastritis 2016   GERD (gastroesophageal reflux disease)    Headache(784.0)    Hemorrhoids    Hyperlipidemia    Hypertension    Kidney insufficiency    LVH (left ventricular hypertrophy)    Dr. Wynonia Lawman ( cardiology) palpitations   Obesity    OSA (obstructive sleep apnea)    Positive PPD, treated 2000   INH   Sleep apnea    uses cpap    Vitamin D deficiency    Past Surgical History:  Procedure Laterality Date   COLONOSCOPY  12/28/2011   Procedure: COLONOSCOPY;  Surgeon: Milus Banister, MD;  Location: [facility];  Service: Endoscopy;  Laterality: N/A;   HEMORRHOID SURGERY     UPPER GASTROINTESTINAL ENDOSCOPY     Allergies  Allergen Reactions   Lisinopril     angioedema   Shrimp [Shellfish Allergy] Nausea And Vomiting   Prior to Admission medications   Medication Sig Start Date End Date Taking? Authorizing Provider  aluminum chloride (DRYSOL) 20 % external solution Apply topically at bedtime to feet as needed. 01/04/22  Yes Wendie Agreste, MD  amLODipine (NORVASC) 5 MG tablet Take 1 tablet (5 mg total) by mouth daily. 03/01/22  Yes Wendie Agreste, MD  atorvastatin (LIPITOR) 40 MG tablet Take 1 tablet by mouth daily 02/17/21  Yes   losartan (COZAAR) 100 MG tablet Take 1 tablet (100 mg total) by mouth daily. 03/01/22  Yes Wendie Agreste, MD  Nebivolol HCl (BYSTOLIC) 20 MG TABS Take 1 tablet (20 mg total) by mouth daily. 03/01/22  Yes Wendie Agreste, MD  omeprazole (PRILOSEC) 40 MG capsule Take 1 capsule (40 mg total) by mouth daily. 03/01/22  Yes Wendie Agreste, MD  spironolactone (ALDACTONE) 50 MG tablet Take 1 tablet by mouth daily 02/17/21  Yes  azithromycin (ZITHROMAX) 500 MG tablet Take 1 tablet (500 mg total) by mouth daily. If signs or symptoms of travelers diarrhea. Patient not taking: Reported on 01/11/2022 05/09/21   Wendie Agreste, MD  doxycycline (VIBRA-TABS) 100 MG tablet Take 1 tablet (100 mg total) by mouth daily. Patient not taking: Reported on 12/14/2021 05/09/21   Wendie Agreste, MD   Social History   Socioeconomic History   Marital status: Married    Spouse name: Not on file   Number of children: 0   Years of education: Not on file   Highest education level: Not on file  Occupational History   Occupation: ENDOSCOPY TECHNICIAN    Employer: [employer]  Tobacco Use   Smoking status:  Never   Smokeless tobacco: Never  Vaping Use   Vaping Use: Never used  Substance and Sexual Activity   Alcohol use: No    Alcohol/week: 0.0 standard drinks of alcohol   Drug use: No   Sexual activity: Yes  Other Topics Concern   Not on file  Social History Narrative   Married   Education: Secretary/administrator   Exercise: Yes   Social Determinants of Health   Financial Resource Strain: Not on file  Food Insecurity: Not on file  Transportation Needs: Not on file  Physical Activity: Not on file  Stress: Not on file  Social Connections: Not on file  Intimate Partner Violence: Not on file    Review of Systems  Per HPI.  Objective:   Vitals:   03/22/22 1546  BP: 138/80  Pulse: (!) 46  Temp: 98.1 F (36.7 C)  SpO2: 99%  Weight: 230 lb (104.3 kg)  Height: 6' (1.829 m)     Physical Exam Vitals reviewed.  Constitutional:      General: He is not in acute distress.    Appearance: Normal appearance. He is well-developed.  HENT:     Head: Normocephalic and atraumatic.  Cardiovascular:     Rate and Rhythm: Normal rate.  Pulmonary:     Effort: Pulmonary effort is normal.  Abdominal:     General: There is no distension.     Tenderness: There is no abdominal tenderness. There is no guarding or rebound.  Genitourinary:   Neurological:     Mental Status: He is alert and oriented to person, place, and time.  Psychiatric:        Mood and Affect: Mood normal.        Assessment & Plan:  [name] is a 56 y.o. male . BRBPR (bright red blood per rectum) - Plan: Ambulatory referral to Gastroenterology Noted at time of straining/hard stools, suspected anal fissure versus hemorrhoidal bleed, 2 episodes only, no recent recurrence.  No change in chronic left-sided abdominal discomfort that he notices with lying on the left side at night with prior reassuring CT and colonoscopy in July.  Skin tag versus nonthrombosed external hemorrhoid on exam, refer to gastroenterology with RTC  precautions.  Handout given on causes of rectal bleeding.  Stool softener, fiber in the diet to minimize straining.  No orders of the defined types were placed in this encounter.  Patient Instructions  I will refer you to gastroenterology but previous bright red blood could have been hemorrhoid especially as you are straining at that time for anal fissure.  See information below.  If any return of symptoms, return for recheck right away, but unlikely to occur.  Stool softener if needed, and fiber in the diet can help prevent  constipation or hard stools.  Return to the clinic or go to the nearest emergency room if any of your symptoms worsen or new symptoms occur.  Rectal Bleeding  Rectal bleeding is when blood passes out of the opening between the buttocks (anus). People with rectal bleeding may notice bright red blood in their underwear or in the toilet after having a bowel movement. They may also have blood mixed with their stool (feces), or dark red or black stools. Rectal bleeding is usually a sign that something is wrong. Many things can cause rectal bleeding, including: Diverticulosis. This is a condition in which pockets or sacs project from the bowel. Hemorrhoids. These are blood vessels around the anus or inside the rectum that are larger than normal. Anal fissures. This is a tear in the anus. Proctitis and colitis. These are conditions in which the rectum, colon, or anus become inflamed. Polyps. These are growths that can be cancerous (malignant) or noncancerous (benign). Infections of the intestines. Fistulas. These are abnormal openings in the rectum and anus. Rectal prolapse. This is when a part of the rectum sticks out from the anus. Follow these instructions at home: Pay attention to any changes in your symptoms. Take these actions to help reduce bleeding and discomfort: Medicines Take over-the-counter and prescription medicines only as told by your health care provider. Ask  your health care provider about changing or stopping your regular medicines or supplements. This is especially important if you are taking blood thinners. Medicines that thin the blood can make rectal bleeding worse. Managing constipation Your condition may cause constipation. To prevent or treat constipation, or to help make your stools soft, you may need to: Drink enough fluid to keep your urine pale yellow. Take over-the-counter or prescription medicines. Eat foods that are high in fiber, such as beans, whole grains, and fresh fruits and vegetables. Ask your health care provider if you need a supplement to give you more fiber. Limit foods that are high in fat and processed sugars, such as fried or sweet foods.  General instructions Try not to strain when having a bowel movement. Try taking a warm bath. This may help to soothe any pain in your rectum. Keep all follow-up visits as told by your health care provider. This is important. Contact a health care provider if you: Have pain or tenderness in your abdomen. Have a fever. Have weakness. Have nausea. Cannot have a bowel movement. Get help right away if you have: New or increased rectal bleeding. Black or dark red stools. Vomit with blood or something that looks like coffee grounds. A fainting episode. Severe pain in your rectum. Summary Rectal bleeding is usually a sign that something is wrong. This condition should be evaluated by a health care provider. Eat a diet that is high in fiber. This will help keep your stools soft, making it easier to pass stools without straining. Medicines that thin the blood can make rectal bleeding worse. Get help right away if you have new or increased rectal bleeding, black or dark red stools, blood in your vomit, an episode of fainting, or severe pain in your rectum. This information is not intended to replace advice given to you by your health care provider. Make sure you discuss any questions you  have with your health care provider. Document Revised: 05/07/2019 Document Reviewed: 05/07/2019 Elsevier Patient Education  2023 [place],   Merri Ray, MD [place] 03/22/22  4:47 PM

## 2022-04-05 ENCOUNTER — Other Ambulatory Visit (HOSPITAL_COMMUNITY): Payer: Self-pay

## 2022-04-06 ENCOUNTER — Other Ambulatory Visit (HOSPITAL_COMMUNITY): Payer: Self-pay

## 2022-04-06 MED ORDER — ATORVASTATIN CALCIUM 40 MG PO TABS
40.0000 mg | ORAL_TABLET | Freq: Every day | ORAL | 0 refills | Status: DC
  Filled 2022-04-06: qty 90, 90d supply, fill #0

## 2022-04-06 MED ORDER — SPIRONOLACTONE 50 MG PO TABS
50.0000 mg | ORAL_TABLET | Freq: Every day | ORAL | 0 refills | Status: DC
  Filled 2022-04-06: qty 90, 90d supply, fill #0

## 2022-04-08 ENCOUNTER — Other Ambulatory Visit (HOSPITAL_COMMUNITY): Payer: Self-pay

## 2022-05-22 ENCOUNTER — Encounter: Payer: Self-pay | Admitting: Family Medicine

## 2022-05-22 ENCOUNTER — Ambulatory Visit (INDEPENDENT_AMBULATORY_CARE_PROVIDER_SITE_OTHER): Payer: No Typology Code available for payment source | Admitting: Family Medicine

## 2022-05-22 ENCOUNTER — Other Ambulatory Visit (HOSPITAL_COMMUNITY): Payer: Self-pay

## 2022-05-22 VITALS — BP 130/76 | HR 47 | Temp 98.2°F | Ht 72.0 in | Wt 232.0 lb

## 2022-05-22 DIAGNOSIS — J342 Deviated nasal septum: Secondary | ICD-10-CM

## 2022-05-22 DIAGNOSIS — J329 Chronic sinusitis, unspecified: Secondary | ICD-10-CM | POA: Diagnosis not present

## 2022-05-22 MED ORDER — PREDNISONE 20 MG PO TABS
40.0000 mg | ORAL_TABLET | Freq: Every day | ORAL | 0 refills | Status: DC
  Filled 2022-05-22: qty 10, 5d supply, fill #0

## 2022-05-22 MED ORDER — AMOXICILLIN-POT CLAVULANATE 875-125 MG PO TABS
1.0000 | ORAL_TABLET | Freq: Two times a day (BID) | ORAL | 0 refills | Status: DC
  Filled 2022-05-22: qty 20, 10d supply, fill #0

## 2022-05-22 NOTE — Progress Notes (Unsigned)
Subjective:  Patient ID: [name], male    DOB: 12-24-1965  Age: 56 y.o. MRN: [id number]  CC:  Chief Complaint  Patient presents with   Referral    Pt needs a referral to an ENT doctor     HPI RAED SCHALK presents for    Left nostril occlusion:  Noticed past month. Feels blocked. Usually left feels closed, sometimes right and left opens up.  Intermittent cpap use in past, but with blocked nostril having to use nightly.  Discussed chronic nasal congestion - discussed 04/2021. had been treated with Dymista, 2-week course of clindamycin prior after CT indicating pansinusitis with occlusion of most sinus drainage pathways.  Mild rightward nasal septal deviation with rightward projecting spur on CT imaging.  No relief with Dymista.  Referred to ENT last year. Saw PA - Shela Nevin.  Told had deviated septum. Possible need for surgery. No relief with nasal spray at that time. Was better, then got worse.  Has not tried nasal spray with current illness as not effective prior.  Would like to see ENT again - no preference.  Worse at night No fever, no face pain, but maxillary sinus pressure. Worse on left.  Discolored nasal discharge every morning past month. Yellow nasal d/c every morning.  No recent abx.   History Patient Active Problem List   Diagnosis Date Noted   Chronic rhinitis 11/23/2020   Deviated septum 11/23/2020   Mild coronary artery disease 02/12/2019   Mixed hyperlipidemia 01/14/2019   Chest pain 01/13/2019   Palpitations 01/13/2019   Low back pain 07/13/2016   Abdominal pain, left lower quadrant 05/17/2016   Elevated serum creatinine 05/29/2015   Gout 10/29/2013   Essential hypertension 03/02/2008   GERD 03/02/2008   Iron deficiency anemia 12/13/2007   HEMORRHOIDS-EXTERNAL 12/13/2007   Past Medical History:  Diagnosis Date   Allergy    Anemia    Erosive gastritis 2016   GERD (gastroesophageal reflux disease)    Headache(784.0)     Hemorrhoids    Hyperlipidemia    Hypertension    Kidney insufficiency    LVH (left ventricular hypertrophy)    Dr. Donnie Aho ( cardiology) palpitations   Obesity    OSA (obstructive sleep apnea)    Positive PPD, treated 2000   INH   Sleep apnea    uses cpap   Vitamin D deficiency    Past Surgical History:  Procedure Laterality Date   COLONOSCOPY  12/28/2011   Procedure: COLONOSCOPY;  Surgeon: [name], MD;  Location: [facility];  Service: Endoscopy;  Laterality: N/A;   HEMORRHOID SURGERY     UPPER GASTROINTESTINAL ENDOSCOPY     Allergies  Allergen Reactions   Lisinopril     angioedema   Shrimp [Shellfish Allergy] Nausea And Vomiting   Prior to Admission medications   Medication Sig Start Date End Date Taking? Authorizing Provider  aluminum chloride (DRYSOL) 20 % external solution Apply topically at bedtime to feet as needed. 01/04/22  Yes Shade Flood, MD  amLODipine (NORVASC) 5 MG tablet Take 1 tablet (5 mg total) by mouth daily. 03/01/22  Yes Shade Flood, MD  atorvastatin (LIPITOR) 40 MG tablet Take 1 tablet (40 mg total) by mouth daily. 04/06/22  Yes   losartan (COZAAR) 100 MG tablet Take 1 tablet (100 mg total) by mouth daily. 03/01/22  Yes Shade Flood, MD  Nebivolol HCl (BYSTOLIC) 20 MG TABS Take 1 tablet (20 mg total) by mouth daily. 03/01/22  Yes Shade Flood, MD  omeprazole (PRILOSEC) 40 MG capsule Take 1 capsule (40 mg total) by mouth daily. 03/01/22  Yes Shade Flood, MD  spironolactone (ALDACTONE) 50 MG tablet Take 1 tablet (50 mg total) by mouth daily. 04/06/22  Yes   azithromycin (ZITHROMAX) 500 MG tablet Take 1 tablet (500 mg total) by mouth daily. If signs or symptoms of travelers diarrhea. Patient not taking: Reported on 01/11/2022 05/09/21   Shade Flood, MD  doxycycline (VIBRA-TABS) 100 MG tablet Take 1 tablet (100 mg total) by mouth daily. Patient not taking: Reported on 12/14/2021 05/09/21   Shade Flood, MD   Social  History   Socioeconomic History   Marital status: Married    Spouse name: Not on file   Number of children: 0   Years of education: Not on file   Highest education level: Not on file  Occupational History   Occupation: ENDOSCOPY TECHNICIAN    Employer: [employer]  Tobacco Use   Smoking status: Never   Smokeless tobacco: Never  Vaping Use   Vaping Use: Never used  Substance and Sexual Activity   Alcohol use: No    Alcohol/week: 0.0 standard drinks of alcohol   Drug use: No   Sexual activity: Yes  Other Topics Concern   Not on file  Social History Narrative   Married   Education: Automotive engineer   Exercise: Yes   Social Determinants of Health   Financial Resource Strain: Not on file  Food Insecurity: Not on file  Transportation Needs: Not on file  Physical Activity: Not on file  Stress: Not on file  Social Connections: Not on file  Intimate Partner Violence: Not on file    Review of Systems Per HPI.  Objective:   Vitals:   05/22/22 1545  BP: 130/76  Pulse: (!) 47  Temp: 98.2 F (36.8 C)  SpO2: 99%  Weight: 232 lb (105.2 kg)  Height: 6' (1.829 m)    Physical Exam Vitals reviewed.  Constitutional:      Appearance: He is well-developed.  HENT:     Head: Normocephalic and atraumatic.     Right Ear: Tympanic membrane, ear canal and external ear normal.     Left Ear: Tympanic membrane, ear canal and external ear normal.     Nose:     Comments: Some dry/discolored nasal discharge noted right greater than left nose, deviated septum.  Some edema of turbinates.  Slight pressure on nasal aspect of maxillary sinuses bilaterally, no other sinus tenderness. Neck:     Vascular: No carotid bruit or JVD.  Cardiovascular:     Rate and Rhythm: Normal rate and regular rhythm.     Heart sounds: Normal heart sounds. No murmur heard. Pulmonary:     Effort: Pulmonary effort is normal.     Breath sounds: Normal breath sounds. No rales.  Musculoskeletal:     Right lower leg: No  edema.     Left lower leg: No edema.  Skin:    General: Skin is warm and dry.  Neurological:     Mental Status: He is alert and oriented to person, place, and time.  Psychiatric:        Mood and Affect: Mood normal.        Assessment & Plan:  KERN GINGRAS is a 56 y.o. male . Recurrent sinusitis - Plan: Ambulatory referral to ENT, predniSONE (DELTASONE) 20 MG tablet, amoxicillin-clavulanate (AUGMENTIN) 875-125 MG tablet  Nasal septal deviation - Plan: Ambulatory  referral to ENT, predniSONE (DELTASONE) 20 MG tablet, amoxicillin-clavulanate (AUGMENTIN) 875-125 MG tablet Recurrent sinusitis with nasal septal deviation.  Similar symptoms last year, saw ENT, CT with pansinusitis as above.  Some recent recurrence/worsening of symptoms.  Possible secondary acute on chronic sinusitis.  Minimal to no relief with treatments as above.  We will start short course of prednisone, Augmentin and refer to ENT with RTC precautions given.  Meds ordered this encounter  Medications   predniSONE (DELTASONE) 20 MG tablet    Sig: Take 2 tablets (40 mg total) by mouth daily with breakfast.    Dispense:  10 tablet    Refill:  0   amoxicillin-clavulanate (AUGMENTIN) 875-125 MG tablet    Sig: Take 1 tablet by mouth 2 (two) times daily.    Dispense:  20 tablet    Refill:  0   Patient Instructions  Start prednisone, antibiotic for sinus infection, chronic sinus congestion.  I will refer you to ENT.  If any acute worsening symptoms be seen.  Thanks for coming in today and let me know if there are questions.      Signed,   [name], MD [facility], [facility] 05/23/22 3:15 PM

## 2022-05-22 NOTE — Patient Instructions (Addendum)
Start prednisone, antibiotic for sinus infection, chronic sinus congestion.  I will refer you to ENT.  If any acute worsening symptoms be seen.  Thanks for coming in today and let me know if there are questions.

## 2022-05-23 ENCOUNTER — Encounter: Payer: Self-pay | Admitting: Family Medicine

## 2022-05-25 ENCOUNTER — Other Ambulatory Visit (HOSPITAL_COMMUNITY): Payer: Self-pay

## 2022-06-15 ENCOUNTER — Encounter: Payer: Self-pay | Admitting: Gastroenterology

## 2022-06-15 NOTE — Progress Notes (Signed)
This is a patient previously followed by Dr. Christella Hartigan. He has been experiencing issues of intermittent episodes of rectal bleeding. Full colonoscopy completed earlier this year in 2023. I would like the patient to have suppository therapy sent in while he is awaiting his clinic visit with me.  Directions Anusol nightly x 3 nights and utilize as needed 12 suppositories/1 refill available.  Thanks. GM

## 2022-06-16 ENCOUNTER — Other Ambulatory Visit: Payer: Self-pay

## 2022-06-16 MED ORDER — HYDROCORTISONE ACETATE 25 MG RE SUPP
25.0000 mg | Freq: Every evening | RECTAL | 1 refills | Status: AC
  Filled 2022-06-16: qty 12, 12d supply, fill #0

## 2022-06-16 NOTE — Progress Notes (Signed)
Anusol supp sent as ordered

## 2022-06-20 DIAGNOSIS — J342 Deviated nasal septum: Secondary | ICD-10-CM | POA: Diagnosis not present

## 2022-06-20 DIAGNOSIS — I10 Essential (primary) hypertension: Secondary | ICD-10-CM | POA: Diagnosis not present

## 2022-06-20 DIAGNOSIS — J339 Nasal polyp, unspecified: Secondary | ICD-10-CM | POA: Diagnosis not present

## 2022-06-21 ENCOUNTER — Other Ambulatory Visit (HOSPITAL_COMMUNITY): Payer: Self-pay

## 2022-06-21 MED ORDER — PREDNISONE 20 MG PO TABS
ORAL_TABLET | ORAL | 0 refills | Status: AC
  Filled 2022-06-21 – 2022-06-22 (×2): qty 18, 9d supply, fill #0

## 2022-06-22 ENCOUNTER — Other Ambulatory Visit (HOSPITAL_COMMUNITY): Payer: Self-pay | Admitting: Otolaryngology

## 2022-06-22 ENCOUNTER — Other Ambulatory Visit (HOSPITAL_COMMUNITY): Payer: Self-pay

## 2022-06-22 ENCOUNTER — Encounter (HOSPITAL_COMMUNITY): Payer: Self-pay

## 2022-06-22 DIAGNOSIS — J342 Deviated nasal septum: Secondary | ICD-10-CM

## 2022-06-22 DIAGNOSIS — I10 Essential (primary) hypertension: Secondary | ICD-10-CM

## 2022-06-22 DIAGNOSIS — J339 Nasal polyp, unspecified: Secondary | ICD-10-CM

## 2022-06-23 ENCOUNTER — Other Ambulatory Visit: Payer: Self-pay

## 2022-07-03 ENCOUNTER — Ambulatory Visit (HOSPITAL_COMMUNITY)
Admission: RE | Admit: 2022-07-03 | Discharge: 2022-07-03 | Disposition: A | Discharge status: Home / Self Care | Payer: 59 | Source: Ambulatory Visit | Attending: Otolaryngology | Admitting: Otolaryngology

## 2022-07-03 ENCOUNTER — Other Ambulatory Visit (HOSPITAL_COMMUNITY): Payer: Self-pay

## 2022-07-03 DIAGNOSIS — J339 Nasal polyp, unspecified: Secondary | ICD-10-CM | POA: Diagnosis not present

## 2022-07-03 DIAGNOSIS — I10 Essential (primary) hypertension: Secondary | ICD-10-CM | POA: Diagnosis not present

## 2022-07-03 DIAGNOSIS — J342 Deviated nasal septum: Secondary | ICD-10-CM | POA: Insufficient documentation

## 2022-07-28 DIAGNOSIS — J310 Chronic rhinitis: Secondary | ICD-10-CM | POA: Diagnosis not present

## 2022-07-28 DIAGNOSIS — J342 Deviated nasal septum: Secondary | ICD-10-CM | POA: Diagnosis not present

## 2022-07-28 DIAGNOSIS — J339 Nasal polyp, unspecified: Secondary | ICD-10-CM | POA: Diagnosis not present

## 2022-07-29 ENCOUNTER — Other Ambulatory Visit (HOSPITAL_COMMUNITY): Payer: Self-pay

## 2022-07-29 MED ORDER — PREDNISONE 20 MG PO TABS
ORAL_TABLET | ORAL | 0 refills | Status: DC
  Filled 2022-07-29 (×2): qty 18, 9d supply, fill #0

## 2022-07-31 ENCOUNTER — Other Ambulatory Visit (HOSPITAL_COMMUNITY): Payer: Self-pay

## 2022-08-02 ENCOUNTER — Ambulatory Visit: Payer: 59 | Admitting: Gastroenterology

## 2022-08-02 ENCOUNTER — Encounter: Payer: Self-pay | Admitting: Gastroenterology

## 2022-08-02 ENCOUNTER — Other Ambulatory Visit: Payer: Self-pay

## 2022-08-02 VITALS — BP 130/62 | HR 58 | Ht 75.0 in | Wt 230.0 lb

## 2022-08-02 DIAGNOSIS — K648 Other hemorrhoids: Secondary | ICD-10-CM | POA: Diagnosis not present

## 2022-08-02 DIAGNOSIS — J0111 Acute recurrent frontal sinusitis: Secondary | ICD-10-CM | POA: Diagnosis not present

## 2022-08-02 DIAGNOSIS — K649 Unspecified hemorrhoids: Secondary | ICD-10-CM | POA: Diagnosis not present

## 2022-08-02 DIAGNOSIS — K625 Hemorrhage of anus and rectum: Secondary | ICD-10-CM | POA: Diagnosis not present

## 2022-08-02 MED ORDER — AZITHROMYCIN 250 MG PO TABS
ORAL_TABLET | ORAL | 0 refills | Status: AC
  Filled 2022-08-02: qty 6, 5d supply, fill #0

## 2022-08-02 NOTE — Patient Instructions (Addendum)
We have sent the following medications to your pharmacy for you to pick up at your convenience: Z-pak   Trial of Cal-mol Suppositories at bedtime for 3 nights if rectal bleeding recurs.   Toileting tips to help with your constipation - Drink at least 64-80 ounces of water/liquid per day. - Establish a time to try to move your bowels every day.  For many people, this is after a cup of coffee or after a meal such as breakfast. - Sit all of the way back on the toilet keeping your back fairly straight and while sitting up, try to rest the tops of your forearms on your upper thighs.   - Raising your feet with a step stool/squatty potty can be helpful to improve the angle that allows your stool to pass through the rectum. - Relax the rectum feeling it bulge toward the toilet water.  If you feel your rectum raising toward your body, you are contracting rather than relaxing. - Breathe in and slowly exhale. "Belly breath" by expanding your belly towards your belly button. Keep belly expanded as you gently direct pressure down and back to the anus.  A low pitched GRRR sound can assist with increasing intra-abdominal pressure.  - Repeat 3-4 times. If unsuccessful, contract the pelvic floor to restore normal tone and get off the toilet.  Avoid excessive straining. - To reduce excessive wiping by teaching your anus to normally contract, place hands on outer aspect of knees and resist knee movement outward.  Hold 5-10 second then place hands just inside of knees and resist inward movement of knees.  Hold 5 seconds.  Repeat a few times each way.   You have been scheduled for an endoscopy and flexible sigmoidoscopy. Please follow the written instructions given to you at your visit today. If you use inhalers (even only as needed), please bring them with you on the day of your procedure. _______________________________________________________  If your blood pressure at your visit was 140/90 or greater, please  contact your primary care physician to follow up on this.  _______________________________________________________  If you are age 57 or older, your body mass index should be between 23-30. Your Body mass index is 28.75 kg/m. If this is out of the aforementioned range listed, please consider follow up with your Primary Care Provider.  If you are age 57 or younger, your body mass index should be between 19-25. Your Body mass index is 28.75 kg/m. If this is out of the aformentioned range listed, please consider follow up with your Primary Care Provider.   ________________________________________________________  The  GI providers would like to encourage you to use [facility] to communicate with providers for non-urgent requests or questions.  Due to long hold times on the telephone, sending your provider a message by [facility] may be a faster and more efficient way to get a response.  Please allow 48 business hours for a response.  Please remember that this is for non-urgent requests.  _______________________________________________________  Thank you for choosing me and [facility].  Dr. Rush Landmark

## 2022-08-03 ENCOUNTER — Other Ambulatory Visit: Payer: Self-pay

## 2022-08-03 ENCOUNTER — Encounter: Payer: Self-pay | Admitting: Pharmacist

## 2022-08-06 ENCOUNTER — Encounter: Payer: Self-pay | Admitting: Gastroenterology

## 2022-08-06 NOTE — Progress Notes (Signed)
[place] VISIT   Primary Care Provider Wendie Agreste, MD [address] [place] [address] [phone]   Patient Profile: Raymond Green is a 58 y.o. male with a pmh significant for Hypertension, hyperlipidemia, obesity, OSA, GERD.  The patient presents to the [facility] for an evaluation and management of problem(s) noted below:  Problem List 1. BRBPR (bright red blood per rectum)   2. Hemorrhoids, unspecified hemorrhoid type   3. Acute recurrent frontal sinusitis     History of Present Illness Please see prior GI notes for full details of HPI.  Interval History This is a patient that I know from our endoscopy team at the hospital and has been followed by Dr. Ardis Hughs for the last few years.  Patient noted a few months ago bright red blood per rectum that occurred on multiple days in a row.  Eventually things settle.  He scheduled a follow-up in clinic with [place] and was scheduled with me, and when he saw me previously in the hospital, we recommended initiation of suppository therapy in the chance/setting that this was hemorrhoidal in nature versus fissural (though he did not have significant pain).  The patient did Anusol suppositories and has done well since then without any recurrence of rectal bleeding.  The patient has not had any other significant GI symptoms.  He had a full colonoscopy in 2023.  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  His bowel habits are such that he uses the restroom once to twice daily and it is formed.  GI Review of Systems Positive as above Negative for dysphagia, odynophagia, nausea, vomiting, pain, melena  Review of Systems General: Denies fevers/chills/weight loss unintentionally Cardiovascular: Denies chest pain Pulmonary: Denies shortness of  breath Gastroenterological: See HPI Genitourinary: Denies darkened urine  Hematological: Denies easy bruising/bleeding Endocrine: Denies temperature intolerance Dermatological: Denies jaundice Psychological: Mood is stable   Medications Current Outpatient Medications  Medication Sig Dispense Refill   aluminum chloride (DRYSOL) 20 % external solution Apply topically at bedtime to feet as needed. 35 mL 0   amLODipine (NORVASC) 5 MG tablet Take 1 tablet (5 mg total) by mouth daily. 90 tablet 1   amoxicillin-clavulanate (AUGMENTIN) 875-125 MG tablet Take 1 tablet by mouth 2 (two) times daily. 20 tablet 0   atorvastatin (LIPITOR) 40 MG tablet Take 1 tablet (40 mg total) by mouth daily. 90 tablet 0   azithromycin (ZITHROMAX) 250 MG tablet Take 2 tablets (500 mg total) by mouth daily for 1 day, THEN 1 tablet (250 mg total) daily for 4 days. 6 tablet 0   losartan (COZAAR) 100 MG tablet Take 1 tablet (100 mg total) by mouth daily. 90 tablet 1   Nebivolol HCl (BYSTOLIC) 20 MG TABS Take 1 tablet (20 mg total) by mouth daily. 90 tablet 1   omeprazole (PRILOSEC) 40 MG capsule Take 1 capsule (40 mg total) by mouth daily. 90 capsule 1   predniSONE (DELTASONE) 20 MG tablet Take 1 tablet by mouth 3 times a day for 3 days,then 1 tablet two times a day for 3 days,then 1 tablet once a day for 3 days. 18 tablet 0   spironolactone (ALDACTONE) 50 MG tablet Take 1 tablet (50 mg total) by mouth daily. 90 tablet 0   No current facility-administered medications for this visit.  Allergies Allergies  Allergen Reactions   Lisinopril     angioedema   Shrimp [Shellfish Allergy] Nausea And Vomiting    Histories Past Medical History:  Diagnosis Date   Allergy    Anemia    Erosive gastritis 2016   GERD (gastroesophageal reflux disease)    Headache(784.0)    Hemorrhoids    Hyperlipidemia    Hypertension    Kidney insufficiency    LVH (left ventricular hypertrophy)    Dr. Wynonia Lawman ( cardiology)  palpitations   Obesity    OSA (obstructive sleep apnea)    Positive PPD, treated 2000   INH   Sleep apnea    uses cpap   Vitamin D deficiency    Past Surgical History:  Procedure Laterality Date   COLONOSCOPY  12/28/2011   Procedure: COLONOSCOPY;  Surgeon: Milus Banister, MD;  Location: [facility];  Service: Endoscopy;  Laterality: N/A;   HEMORRHOID SURGERY     UPPER GASTROINTESTINAL ENDOSCOPY     Social History   Socioeconomic History   Marital status: Married    Spouse name: Not on file   Number of children: 0   Years of education: Not on file   Highest education level: Not on file  Occupational History   Occupation: ENDOSCOPY TECHNICIAN    Employer: [employer]  Tobacco Use   Smoking status: Never   Smokeless tobacco: Never  Vaping Use   Vaping Use: Never used  Substance and Sexual Activity   Alcohol use: No    Alcohol/week: 0.0 standard drinks of alcohol   Drug use: No   Sexual activity: Yes  Other Topics Concern   Not on file  Social History Narrative   Married   Education: Secretary/administrator   Exercise: Yes   Social Determinants of Health   Financial Resource Strain: Not on file  Food Insecurity: Not on file  Transportation Needs: Not on file  Physical Activity: Not on file  Stress: Not on file  Social Connections: Not on file  Intimate Partner Violence: Not on file   Family History  Problem Relation Age of Onset   Diabetes Mother    Heart disease Mother    Hypertension Mother    Hypertension Father    Diabetes Sister    Colon cancer Neg Hx    Colon polyps Neg Hx    Esophageal cancer Neg Hx    Rectal cancer Neg Hx    Stomach cancer Neg Hx    Inflammatory bowel disease Neg Hx    Liver disease Neg Hx    Pancreatic cancer Neg Hx    I have reviewed his medical, social, and family history in detail and updated the electronic medical record as necessary.    PHYSICAL EXAMINATION  BP 130/62   Pulse (!) 58   Ht 6' 3"$  (1.905 m)   Wt 230 lb (104.3 kg)    BMI 28.75 kg/m  Wt Readings from Last 3 Encounters:  08/02/22 230 lb (104.3 kg)  05/22/22 232 lb (105.2 kg)  03/22/22 230 lb (104.3 kg)  GEN: NAD, appears stated age, doesn't appear chronically ill PSYCH: Cooperative, without pressured speech EYE: Conjunctivae pink, sclerae anicteric ENT: MMM, Mild tap tenderness to Frontal Maxillary sinuses CV: Nontachycardic RESP: No audible wheezing GI: NABS, soft, rounded, NT/ND, without rebound or guarding GU: DRE shows small external skin tags, with palpation of what seems to be hemorrhoids, and with anoscopy showing 2 columns of internal hemorrhoids MSK/EXT: No significant lower extremity edema SKIN: No jaundice  NEURO:  Alert & Oriented x 3, no focal deficits   REVIEW OF DATA  I reviewed the following data at the time of this encounter:  GI Procedures and Studies  July 2023 colonoscopy by Dr. Ardis Hughs The entire examined colon is normal on direct and retroflexed views. No polyps or cancers  Laboratory Studies  Reviewed those in epic  Imaging Studies  No relevant studies to review   ASSESSMENT  Raymond Green is a 57 y.o. male with a pmh significant for Hypertension, hyperlipidemia, obesity, OSA, GERD.  The patient is seen today for evaluation and management of:  1. BRBPR (bright red blood per rectum)   2. Hemorrhoids, unspecified hemorrhoid type   3. Acute recurrent frontal sinusitis    The patient is hemodynamically stable.  Clinically he seems to be doing better after the Anusol therapy.  I suspect his symptoms were most likely hemorrhoidal in nature based on his current clinical evaluation.  He had a full colonoscopy within the last year, so missing a colon cancer or rectal cancer would be very unlikely though nothing in life or medicine is absolute.  For peace of mind and to help [place] understand whether anything else could be occurring and before potential hemorrhoidal banding protocol would be considered, we will plan to repeat a flexible  sigmoidoscopy.  The patient will be able to use Anusol suppositories as needed or Calmol-4 suppositories OTC as needed.  Will try to minimize using them if possible to 3 nights in a row.  Thankfully no further bleeding so hopefully he does not have issues recur significantly.  For possible sinusitis, I will send the patient in a Z-Pak and he will follow-up with his primary care provider if his symptoms persist post Z-Pak treatment.   PLAN  Continue Anusol suppositories as needed If wanting to hold on prescription suppositories then use Calmol-4 Continue fiber supplementation Toileting instructions discussed Proceed with scheduling flexible sigmoidoscopy Holding on hemorrhoidal banding for now but consider depending on symptoms and findings at sigmoidoscopy Z-Pak for sinus infection symptoms and follow-up with PCP thereafter   Orders Placed This Encounter  Procedures   Ambulatory referral to Gastroenterology    New Prescriptions   AZITHROMYCIN (ZITHROMAX) 250 MG TABLET    Take 2 tablets (500 mg total) by mouth daily for 1 day, THEN 1 tablet (250 mg total) daily for 4 days.   Modified Medications   No medications on file    Planned Follow Up No follow-ups on file.   Total Time in Face-to-Face and in Coordination of Care for patient including independent/personal interpretation/review of prior testing, medical history, examination, medication adjustment, communicating results with the patient directly, and documentation within the EHR is 30 minutes.   Justice Britain, MD [facility] # [id number]

## 2022-08-08 ENCOUNTER — Encounter: Payer: Self-pay | Admitting: Internal Medicine

## 2022-08-08 ENCOUNTER — Ambulatory Visit (INDEPENDENT_AMBULATORY_CARE_PROVIDER_SITE_OTHER): Payer: 59 | Admitting: Internal Medicine

## 2022-08-08 ENCOUNTER — Other Ambulatory Visit (HOSPITAL_COMMUNITY): Payer: Self-pay

## 2022-08-08 ENCOUNTER — Other Ambulatory Visit: Payer: Self-pay

## 2022-08-08 VITALS — BP 132/84 | HR 50 | Temp 98.1°F | Resp 16 | Ht 72.0 in | Wt 239.8 lb

## 2022-08-08 DIAGNOSIS — J330 Polyp of nasal cavity: Secondary | ICD-10-CM | POA: Diagnosis not present

## 2022-08-08 DIAGNOSIS — G4733 Obstructive sleep apnea (adult) (pediatric): Secondary | ICD-10-CM | POA: Diagnosis not present

## 2022-08-08 DIAGNOSIS — K219 Gastro-esophageal reflux disease without esophagitis: Secondary | ICD-10-CM

## 2022-08-08 DIAGNOSIS — J3089 Other allergic rhinitis: Secondary | ICD-10-CM

## 2022-08-08 DIAGNOSIS — J324 Chronic pansinusitis: Secondary | ICD-10-CM

## 2022-08-08 DIAGNOSIS — J302 Other seasonal allergic rhinitis: Secondary | ICD-10-CM

## 2022-08-08 MED ORDER — AZELASTINE HCL 0.1 % NA SOLN
1.0000 | Freq: Two times a day (BID) | NASAL | 5 refills | Status: DC
  Filled 2022-08-08: qty 30, 50d supply, fill #0

## 2022-08-08 MED ORDER — CETIRIZINE HCL 10 MG PO TABS
10.0000 mg | ORAL_TABLET | Freq: Every day | ORAL | 5 refills | Status: DC
  Filled 2022-08-08: qty 30, 30d supply, fill #0

## 2022-08-08 MED ORDER — FLUTICASONE PROPIONATE 50 MCG/ACT NA SUSP
2.0000 | Freq: Every day | NASAL | 5 refills | Status: DC
  Filled 2022-08-08: qty 16, 30d supply, fill #0

## 2022-08-08 NOTE — Patient Instructions (Addendum)
Allergic Rhinitis Pansinusitis with Nasal Polyps - Follow up with ENT Dr. Constance Holster for sinus surgery.  Nose sprays will work better after surgery.  - Positive skin test 07/2022: trees, weeds, grasses, mold, dust mite, cockroach - Avoidance measures discussed. - Use nasal saline rinses before nose sprays such as with Neilmed Sinus Rinse.  Use distilled water.   - Use Flonase 2 sprays each nostril daily. Aim upward and outward. - Use Azelastine 1-2 sprays each nostril twice daily as needed. Aim upward and outward. - Use Zyrtec 10 mg daily as needed for runny nose, sneezing, itchy watery eyes.  - Consider allergy shots as long term control of your symptoms by teaching your immune system to be more tolerant of your allergy triggers.    GERD -Take Prilosec 73m about 30-45 minutes before breakfast.   -Avoid lying down for at least two hours after a meal or after drinking acidic beverages, like soda, or other caffeinated beverages. This can help to prevent stomach contents from flowing back into the esophagus. -Keep your head elevated while you sleep. Using an extra pillow or two can also help to prevent reflux. -Eat smaller and more frequent meals each day instead of a few large meals. This promotes digestion and can aid in preventing heartburn. -Wear loose-fitting clothes to ease pressure on the stomach, which can worsen heartburn and reflux. -Reduce excess weight around the midsection. This can ease pressure on the stomach. Such pressure can force some stomach contents back up the esophagus.   Obstructive Sleep Apnea - Remember to wear CPAP machine every time you sleep with a humidifier.     ALLERGEN AVOIDANCE MEASURES   Dust Mites Use central air conditioning and heat; and change the filter monthly.  Pleated filters work better than mesh filters.  Electrostatic filters may also be used; wash the filter monthly.  Window air conditioners may be used, but do not clean the air as well as a central  air conditioner.  Change or wash the filter monthly. Keep windows closed.  Do not use attic fans.   Encase the mattress, box springs and pillows with zippered, dust proof covers. Wash the bed linens in hot water weekly.   Remove carpet, especially from the bedroom. Remove stuffed animals, throw pillows, dust ruffles, heavy drapes and other items that collect dust from the bedroom. Do not use a humidifier.   Use wood, vinyl or leather furniture instead of cloth furniture in the bedroom. Keep the indoor humidity at 30 - 40%.  Monitor with a humidity gauge.  Molds - Indoor avoidance Use air conditioning to reduce indoor humidity.  Do not use a humidifier. Keep indoor humidity at 30 - 40%.  Use a dehumidifier if needed. In the bathroom use an exhaust fan or open a window after showering.  Wipe down damp surfaces after showering.  Clean bathrooms with a mold-killing solution (diluted bleach, or products like Tilex, etc) at least once a month. In the kitchen use an exhaust fan to remove steam from cooking.  Throw away spoiled foods immediately, and empty garbage daily.  Empty water pans below self-defrosting refrigerators frequently. Vent the clothes dryer to the outside. Limit indoor houseplants; mold grows in the dirt.  No houseplants in the bedroom. Remove carpet from the bedroom. Encase the mattress and box springs with a zippered encasing.  Molds - Outdoor avoidance Avoid being outside when the grass is being mowed, or the ground is tilled. Avoid playing in leaves, pine straw, hay, etc.  Dead plant materials contain mold. Avoid going into barns or grain storage areas. Remove leaves, clippings and compost from around the home.  Cockroach Limit spread of food around the house; especially keep food out of bedrooms. Keep food and garbage in closed containers with a tight lid.  Never leave food out in the kitchen.  Do not leave out pet food or dirty food bowls. Mop the kitchen floor and wash  countertops at least once a week. Repair leaky pipes and faucets so there is no standing water to attract roaches. Plug up cracks in the house through which cockroaches can enter. Use bait stations and approved pesticides to reduce cockroach infestation. Pollen Avoidance Pollen levels are highest during the mid-day and afternoon.  Consider this when planning outdoor activities. Avoid being outside when the grass is being mowed, or wear a mask if the pollen-allergic person must be the one to mow the grass. Keep the windows closed to keep pollen outside of the home. Use an air conditioner to filter the air. Take a shower, wash hair, and change clothing after working or playing outdoors during pollen season. Pet Dander Keep the pet out of your bedroom and restrict it to only a few rooms. Be advised that keeping the pet in only one room will not limit the allergens to that room. Don't pet, hug or kiss the pet; if you do, wash your hands with soap and water. High-efficiency particulate air (HEPA) cleaners run continuously in a bedroom or living room can reduce allergen levels over time. Regular use of a high-efficiency vacuum cleaner or a central vacuum can reduce allergen levels. Giving your pet a bath at least once a week can reduce airborne allergen.

## 2022-08-08 NOTE — Progress Notes (Signed)
NEW PATIENT  Date of Service/Encounter:  08/08/22  Consult requested by: Wendie Agreste, MD   Subjective:   Raymond Green (DOB: August 03, 1965) is a 57 y.o. male who presents to the clinic on 08/08/2022 with a chief complaint of Allergic Rhinitis  (Saw ENT in January 19th and February 9th and was referred here for nasal polyps - no current allergy medications  ) .    History obtained from: chart review and patient.   Sleep apnea: Reports having trouble wearing his CPAP mask due to congestion but for the most part, he does wear the CPAP with humidifier with sleep.   GERD: Reports intermittent symptoms of heartburn. He is not practicing lifestyle management.  Does use Prilosec almost daily which helps.   Rhinitis:  Started about a year ago but worse the past 3-4 months.  Symptoms include: nasal congestion, rhinorrhea, and post nasal drainage  Very poor sense of smell and has yellow mucoid drainage.  Not much sinus pain.  Followed by ENT.  Reports seeing them in 2022 and more recently this year with Dr. Constance Holster.  Occurs year-round Potential triggers: not sure Treatments tried:  Dymista Multiple steroid and antibiotic courses. Prednisone does help open him up but does not resolve all the symptoms.   Previous allergy testing: no History of reflux/heartburn: yes History of sinus surgery: yes years ago in 1980s but not for nasal polyps  Nonallergic triggers: none    Past Medical History: Past Medical History:  Diagnosis Date   Allergy    Anemia    Erosive gastritis 2016   GERD (gastroesophageal reflux disease)    Headache(784.0)    Hemorrhoids    Hyperlipidemia    Hypertension    Kidney insufficiency    LVH (left ventricular hypertrophy)    Dr. Wynonia Lawman ( cardiology) palpitations   Obesity    OSA (obstructive sleep apnea)    Positive PPD, treated 2000   INH   Recurrent upper respiratory infection (URI)    Sleep apnea    uses cpap   Vitamin D deficiency    Past  Surgical History: Past Surgical History:  Procedure Laterality Date   COLONOSCOPY  12/28/2011   Procedure: COLONOSCOPY;  Surgeon: Milus Banister, MD;  Location: [facility];  Service: Endoscopy;  Laterality: N/A;   HEMORRHOID SURGERY     UPPER GASTROINTESTINAL ENDOSCOPY      Family History: Family History  Problem Relation Age of Onset   Diabetes Mother    Heart disease Mother    Hypertension Mother    Hypertension Father    Diabetes Sister    Colon cancer Neg Hx    Colon polyps Neg Hx    Esophageal cancer Neg Hx    Rectal cancer Neg Hx    Stomach cancer Neg Hx    Inflammatory bowel disease Neg Hx    Liver disease Neg Hx    Pancreatic cancer Neg Hx     Social History:  Lives in a 62 year house Flooring in bedroom: carpet Pets: none Tobacco use/exposure: none Job: endoscopy tech  Medication List:  Allergies as of 08/08/2022       Reactions   Lisinopril    angioedema   Shrimp [shellfish Allergy] Nausea And Vomiting        Medication List        Accurate as of August 08, 2022  4:12 PM. If you have any questions, ask your nurse or doctor.  amLODipine 5 MG tablet Commonly known as: NORVASC Take 1 tablet (5 mg total) by mouth daily.   amoxicillin-clavulanate 875-125 MG tablet Commonly known as: AUGMENTIN Take 1 tablet by mouth 2 (two) times daily.   atorvastatin 40 MG tablet Commonly known as: Lipitor Take 1 tablet (40 mg total) by mouth daily.   azelastine 0.1 % nasal spray Commonly known as: ASTELIN Place 1 spray into both nostrils 2 (two) times daily. Use in each nostril as directed Started by: Larose Kells, MD   azithromycin 250 MG tablet Commonly known as: Zithromax Take 2 tablets (500 mg total) by mouth daily for 1 day, THEN 1 tablet (250 mg total) daily for 4 days. Start taking on: August 03, 2022   cetirizine 10 MG tablet Commonly known as: ZyrTEC Allergy Take 1 tablet (10 mg total) by mouth daily. Started by: Larose Kells, MD   Drysol 20 % external solution Generic drug: aluminum chloride Apply topically at bedtime to feet as needed.   fluticasone 50 MCG/ACT nasal spray Commonly known as: FLONASE Place 2 sprays into both nostrils daily. Started by: Larose Kells, MD   losartan 100 MG tablet Commonly known as: COZAAR Take 1 tablet (100 mg total) by mouth daily.   Nebivolol HCl 20 MG Tabs Commonly known as: Bystolic Take 1 tablet (20 mg total) by mouth daily.   omeprazole 40 MG capsule Commonly known as: PRILOSEC Take 1 capsule (40 mg total) by mouth daily.   predniSONE 20 MG tablet Commonly known as: DELTASONE Take 1 tablet by mouth 3 times a day for 3 days,then 1 tablet two times a day for 3 days,then 1 tablet once a day for 3 days.   spironolactone 50 MG tablet Commonly known as: ALDACTONE Take 1 tablet (50 mg total) by mouth daily.         REVIEW OF SYSTEMS: Pertinent positives and negatives discussed in HPI.   Objective:   Physical Exam: BP 132/84   Pulse (!) 50   Temp 98.1 F (36.7 C)   Resp 16   Ht 6' (1.829 m)   Wt 239 lb 12.8 oz (108.8 kg)   SpO2 99%   BMI 32.52 kg/m  Body mass index is 32.52 kg/m. GEN: alert, well developed HEENT: clear conjunctiva, TM grey and translucent, nose with + inferior turbinate hypertrophy, pink nasal mucosa, lots of clear rhinorrhea, + cobblestoning, no nasal polyps on anterior exam HEART: regular rate and rhythm, no murmur LUNGS: clear to auscultation bilaterally, no coughing, unlabored respiration ABDOMEN: soft, non distended  SKIN: no rashes or lesions  Reviewed:  07/28/2022: seen by Dr. Constance Holster ENT for chronic rhinitis with nasal polyps.  Has attempted multiple courses of oral prednisone and antibiotics. CT sinus 07/04/2022 with sinonasal polyposis and pansinus disease with partial obstruction of major drainage pathways.   10/05/2020: seen by Orland Mustard NP for 1 week of congestion and trouble breathing due to it.  Started on Augmentin  and Dymista.   Previously seen by Cardiology Dr. Virgina Jock for chest pain but coronary CT angiogram showed mild nonobstructive CAD, discussed lifestyle modification and continue Lipitor.  No need for follow up, can continue seeing PCP.  Skin Testing:  Skin prick testing was placed, which includes aeroallergens/foods, histamine control, and saline control.  Verbal consent was obtained prior to placing test.  Patient tolerated procedure well.  Allergy testing results were read and interpreted by myself, documented by clinical staff. Adequate positive and negative control.  Results discussed with patient/family.  Airborne Adult Perc - 08/08/22 1445     Time Antigen Placed 1445    Allergen Manufacturer Lavella Hammock    Location Back    Number of Test 59    1. Control-Buffer 50% Glycerol Negative    2. Control-Histamine 1 mg/ml 3+    3. Albumin saline Negative    4. [place] Negative    5. [place] Negative    6. Johnson Negative    7. [place] Blue Negative    8. Meadow Fescue Negative    9. Perennial Rye Negative    10. Sweet Vernal Negative    11. Timothy Negative    12. Cocklebur Negative    13. Burweed Marshelder Negative    14. Ragweed, short Negative    15. Ragweed, Giant Negative    16. Plantain,  English Negative    17. Lamb's Quarters Negative    18. Sheep Sorrell Negative    19. Rough Pigweed Negative    20. Marsh Elder, Rough Negative    21. Mugwort, Common Negative    22. Ash mix 2+    23. Birch mix 2+    24. Beech American 2+    25. Box, Elder Negative    26. Cedar, red Negative    27. Cottonwood, [place] Negative    28. Elm mix Negative    29. Hickory 3+    30. Maple mix Negative    31. Oak, [place] mix 3+    32. Pecan Pollen 3+    33. Pine mix Negative    34. Sycamore Eastern Negative    35. [place], Black Pollen Negative    36. Alternaria alternata Negative    37. Cladosporium Herbarum Negative    38. Aspergillus mix Negative    39. Penicillium mix Negative     40. Bipolaris sorokiniana (Helminthosporium) Negative    41. Drechslera spicifera (Curvularia) Negative    42. Mucor plumbeus Negative    43. Fusarium moniliforme Negative    44. Aureobasidium pullulans (pullulara) Negative    45. Rhizopus oryzae Negative    46. Botrytis cinera Negative    47. Epicoccum nigrum Negative    48. Phoma betae Negative    49. Candida Albicans Negative    50. Trichophyton mentagrophytes Negative    51. Mite, D Farinae  5,000 AU/ml 3+    52. Mite, D Pteronyssinus  5,000 AU/ml 3+    53. Cat Hair 10,000 BAU/ml Negative    54.  Dog Epithelia Negative    55. Mixed Feathers Negative    56. Horse Epithelia Negative    57. Cockroach, German 3+    58. Mouse Negative    59. Tobacco Leaf Negative             Intradermal - 08/08/22 1526     Time Antigen Placed 1527    Allergen Manufacturer Lavella Hammock    Location Arm    Number of Test 12    Intradermal Select    Control Negative    Gu[place] Negative    Johnson 2+    7 Grass Negative    Ragweed mix 2+    Weed mix 3+    Mold 1 Negative    Mold 2 2+    Mold 3 2+    Mold 4 Negative    Cat 3+    Dog Negative               Assessment:   1. Chronic pansinusitis   2. Polyp of nasal cavity  3. OSA on CPAP   4. Gastroesophageal reflux disease without esophagitis   5. Seasonal and perennial allergic rhinitis     Plan/Recommendations:  Allergic Rhinitis Pansinusitis with Nasal Polyposis - Follow up with ENT Dr. Constance Holster for sinus surgery due significant sinus disease, last CT 06/2022 showed significant pansinus disease obstructing most major drainage pathways and sinonasal polyposis.  Nose sprays will work better after surgery. We can discuss AIT afterwards.  Of note, he is on a beta blocker.     - Positive skin test 07/2022: trees, weeds, grasses, mold, dust mite, cockroach - Avoidance measures discussed. - Use nasal saline rinses before nose sprays such as with Neilmed Sinus Rinse.  Use distilled water.    - Use Flonase 2 sprays each nostril daily. Aim upward and outward. - Use Azelastine 1-2 sprays each nostril twice daily as needed. Aim upward and outward. - Use Zyrtec 10 mg daily as needed for runny nose, sneezing, itchy watery eyes.  - Consider allergy shots as long term control of your symptoms by teaching your immune system to be more tolerant of your allergy triggers.    GERD -Take Prilosec 21m about 30-45 minutes before breakfast.   -Avoid lying down for at least two hours after a meal or after drinking acidic beverages, like soda, or other caffeinated beverages. This can help to prevent stomach contents from flowing back into the esophagus. -Keep your head elevated while you sleep. Using an extra pillow or two can also help to prevent reflux. -Eat smaller and more frequent meals each day instead of a few large meals. This promotes digestion and can aid in preventing heartburn. -Wear loose-fitting clothes to ease pressure on the stomach, which can worsen heartburn and reflux. -Reduce excess weight around the midsection. This can ease pressure on the stomach. Such pressure can force some stomach contents back up the esophagus.   Obstructive Sleep Apnea - Remember to wear CPAP machine with humidifier every time you sleep.   Return in about 3 months (around 11/06/2022).  PHarlon Flor MD [facility]

## 2022-08-09 ENCOUNTER — Other Ambulatory Visit: Payer: Self-pay

## 2022-09-02 ENCOUNTER — Ambulatory Visit
Admission: EM | Admit: 2022-09-02 | Discharge: 2022-09-02 | Disposition: A | Discharge status: Home / Self Care | Payer: 59 | Attending: Emergency Medicine | Admitting: Emergency Medicine

## 2022-09-02 ENCOUNTER — Other Ambulatory Visit (HOSPITAL_COMMUNITY): Payer: Self-pay

## 2022-09-02 DIAGNOSIS — Z760 Encounter for issue of repeat prescription: Secondary | ICD-10-CM | POA: Diagnosis not present

## 2022-09-02 DIAGNOSIS — I10 Essential (primary) hypertension: Secondary | ICD-10-CM

## 2022-09-02 MED ORDER — PREDNISONE 20 MG PO TABS
ORAL_TABLET | ORAL | 0 refills | Status: AC
  Filled 2022-09-02: qty 18, fill #0
  Filled 2022-09-04: qty 18, 9d supply, fill #0

## 2022-09-02 MED ORDER — AZELASTINE HCL 0.1 % NA SOLN
1.0000 | Freq: Two times a day (BID) | NASAL | 5 refills | Status: DC
  Filled 2022-09-02: qty 30, 50d supply, fill #0

## 2022-09-02 NOTE — Discharge Instructions (Signed)
Continue to take your blood pressure medication as prescribed  check blood pressure daily and keep a log Discussed importance of low salt diet and exercise Follow-up with PCP Medication was refilled Please follow up with PCP for further evaluation and management of blood pressure Return or go to the ER if you experience any new or worsening symptoms such as: headache, vision changes, dizziness, lightheadedness, chest pain, shortness of breath, numbness or tingling in extremities, abdominal pain, changes in bowel or bladder habits, etc..Marland Kitchen

## 2022-09-02 NOTE — ED Triage Notes (Signed)
Pt presents with elevated blood pressure today. °

## 2022-09-02 NOTE — Progress Notes (Signed)
BP is high even on recheck. Recommenced pt to go to Urgent Care by [facility] court. Pt agreed to go. Follow up with SDOH questions. Thank you!!

## 2022-09-02 NOTE — ED Provider Notes (Signed)
[place]   [id number] 09/02/22 Arrival Time: 34   Chief Complaint  Patient presents with   Hypertension     SUBJECTIVE: History from: patient.  Raymond Green is a 57 y.o. male presented to the urgent care with a complaint of elevated blood pressure.  He states he has hypertension and he is on medication.  Currently taking losartan 100 mg,, Norvasc 5 mg.  Reports he hasnot take his medication for today yet .  Stated he was sent to the urgent care by a nurse at a blood pressure check free clinic.  Does have a PCP.  Currently denies any symptoms related to his blood pressure.  Denies HA, vision changes, dizziness, lightheadedness, chest pain, shortness of breath, numbness or tingling in extremities, abdominal pain, changes in bowel or bladder habits.      ROS: As per HPI.  All other pertinent ROS negative.      Past Medical History:  Diagnosis Date   Allergy    Anemia    Erosive gastritis 2016   GERD (gastroesophageal reflux disease)    Headache(784.0)    Hemorrhoids    Hyperlipidemia    Hypertension    Kidney insufficiency    LVH (left ventricular hypertrophy)    Dr. Wynonia Lawman ( cardiology) palpitations   Obesity    OSA (obstructive sleep apnea)    Positive PPD, treated 2000   INH   Recurrent upper respiratory infection (URI)    Sleep apnea    uses cpap   Vitamin D deficiency    Past Surgical History:  Procedure Laterality Date   COLONOSCOPY  12/28/2011   Procedure: COLONOSCOPY;  Surgeon: Milus Banister, MD;  Location: [facility];  Service: Endoscopy;  Laterality: N/A;   HEMORRHOID SURGERY     UPPER GASTROINTESTINAL ENDOSCOPY     Allergies  Allergen Reactions   Lisinopril     angioedema   Shrimp [Shellfish Allergy] Nausea And Vomiting   No current facility-administered medications on file prior to encounter.   Current Outpatient Medications on File Prior to Encounter  Medication Sig Dispense Refill   aluminum chloride (DRYSOL) 20 % external  solution Apply topically at bedtime to feet as needed. 35 mL 0   amLODipine (NORVASC) 5 MG tablet Take 1 tablet (5 mg total) by mouth daily. 90 tablet 1   amoxicillin-clavulanate (AUGMENTIN) 875-125 MG tablet Take 1 tablet by mouth 2 (two) times daily. 20 tablet 0   atorvastatin (LIPITOR) 40 MG tablet Take 1 tablet (40 mg total) by mouth daily. 90 tablet 0   cetirizine (ZYRTEC ALLERGY) 10 MG tablet Take 1 tablet (10 mg total) by mouth daily. 30 tablet 5   fluticasone (FLONASE) 50 MCG/ACT nasal spray Place 2 sprays into both nostrils daily. 16 g 5   losartan (COZAAR) 100 MG tablet Take 1 tablet (100 mg total) by mouth daily. 90 tablet 1   Nebivolol HCl (BYSTOLIC) 20 MG TABS Take 1 tablet (20 mg total) by mouth daily. 90 tablet 1   omeprazole (PRILOSEC) 40 MG capsule Take 1 capsule (40 mg total) by mouth daily. 90 capsule 1   predniSONE (DELTASONE) 20 MG tablet Take 1 tablet by mouth 3 times a day for 3 days,then 1 tablet two times a day for 3 days,then 1 tablet once a day for 3 days. 18 tablet 0   spironolactone (ALDACTONE) 50 MG tablet Take 1 tablet (50 mg total) by mouth daily. 90 tablet 0   Social History   Socioeconomic History  Marital status: Married    Spouse name: Not on file   Number of children: 0   Years of education: Not on file   Highest education level: Not on file  Occupational History   Occupation: ENDOSCOPY TECHNICIAN    Employer: [employer]  Tobacco Use   Smoking status: Never   Smokeless tobacco: Never  Vaping Use   Vaping Use: Never used  Substance and Sexual Activity   Alcohol use: No    Alcohol/week: 0.0 standard drinks of alcohol   Drug use: No   Sexual activity: Yes  Other Topics Concern   Not on file  Social History Narrative   Married   Education: Secretary/administrator   Exercise: Yes   Social Determinants of Health   Financial Resource Strain: Not on file  Food Insecurity: Food Insecurity Present (09/02/2022)   Hunger Vital Sign    Worried About Running Out  of Food in the Last Year: Sometimes true    Ran Out of Food in the Last Year: Sometimes true  Transportation Needs: No Transportation Needs (09/02/2022)   PRAPARE - Hydrologist (Medical): No    Lack of Transportation (Non-Medical): No  Physical Activity: Not on file  Stress: Not on file  Social Connections: Not on file  Intimate Partner Violence: Not At Risk (09/02/2022)   Humiliation, Afraid, Rape, and Kick questionnaire    Fear of Current or Ex-Partner: No    Emotionally Abused: No    Physically Abused: No    Sexually Abused: No   Family History  Problem Relation Age of Onset   Diabetes Mother    Heart disease Mother    Hypertension Mother    Hypertension Father    Diabetes Sister    Colon cancer Neg Hx    Colon polyps Neg Hx    Esophageal cancer Neg Hx    Rectal cancer Neg Hx    Stomach cancer Neg Hx    Inflammatory bowel disease Neg Hx    Liver disease Neg Hx    Pancreatic cancer Neg Hx     OBJECTIVE:  Vitals:   09/02/22 1501  BP: (!) 174/108  Pulse: 80  Resp: 18  Temp: 98.2 F (36.8 C)  TempSrc: Oral  SpO2: 98%     Physical Exam Vitals and nursing note reviewed.  Constitutional:      General: He is not in acute distress.    Appearance: Normal appearance. He is normal weight. He is not ill-appearing, toxic-appearing or diaphoretic.  HENT:     Head: Normocephalic.     Right Ear: Tympanic membrane, ear canal and external ear normal. There is no impacted cerumen.     Left Ear: Tympanic membrane, ear canal and external ear normal. There is no impacted cerumen.  Cardiovascular:     Rate and Rhythm: Normal rate and regular rhythm.     Pulses: Normal pulses.     Heart sounds: Normal heart sounds. No murmur heard.    No friction rub. No gallop.  Pulmonary:     Effort: Pulmonary effort is normal. No respiratory distress.     Breath sounds: Normal breath sounds. No stridor. No wheezing, rhonchi or rales.  Chest:     Chest wall: No  tenderness.  Neurological:     Mental Status: He is alert and oriented to person, place, and time.     GCS: GCS eye subscore is 4. GCS verbal subscore is 5. GCS motor subscore is 6.  Cranial Nerves: Cranial nerves 2-12 are intact.     Sensory: Sensation is intact.     Motor: Motor function is intact.     Coordination: Coordination is intact.     LABS:  No results found for this or any previous visit (from the past 24 hour(s)).   ASSESSMENT & PLAN:  1. Essential hypertension   2. Medication refill     Meds ordered this encounter  Medications   azelastine (ASTELIN) 0.1 % nasal spray    Sig: Place 1 spray into both nostrils 2 (two) times daily. Use in each nostril as directed    Dispense:  30 mL    Refill:  5   Discharge Instructions   Continue to take your blood pressure medication as prescribed  check blood pressure daily and keep a log Discussed importance of low salt diet and exercise Follow-up with PCP Medication was refilled Please follow up with PCP for further evaluation and management of blood pressure Return or go to the ER if you experience any new or worsening symptoms such as: headache, vision changes, dizziness, lightheadedness, chest pain, shortness of breath, numbness or tingling in extremities, abdominal pain, changes in bowel or bladder habits, etc...    Reviewed expectations re: course of current medical issues. Questions answered. Outlined signs and symptoms indicating need for more acute intervention. Patient verbalized understanding. After Visit Summary given.          Emerson Monte, FNP 09/02/22 1534

## 2022-09-04 ENCOUNTER — Encounter: Payer: Self-pay | Admitting: *Deleted

## 2022-09-04 ENCOUNTER — Other Ambulatory Visit (HOSPITAL_COMMUNITY): Payer: Self-pay

## 2022-09-04 NOTE — Progress Notes (Signed)
Pt seen at 09/02/22 screening event where b/p was 174/108 initially, and on repeat check was 180/105. Event RN advised pt to go to [facility] Urgent care, which pt did. Pt told event RN he had been taking his b/p meds; and told Urgent care he had not take b/p meds that day - Urgent care also documented that pt was asymptomatic from elevated b/p. Pt 's info sent via in-basket to his PCP. Phone message left for pt to call his PCP for f/u. SDOH notes indicate pt has food insecurity to letter and list of food pantries sent to pt. No additional health equity team support indicated at this time.

## 2022-09-07 ENCOUNTER — Ambulatory Visit (INDEPENDENT_AMBULATORY_CARE_PROVIDER_SITE_OTHER): Payer: 59 | Admitting: Family Medicine

## 2022-09-07 ENCOUNTER — Encounter: Payer: Self-pay | Admitting: Family Medicine

## 2022-09-07 VITALS — BP 138/78 | HR 58 | Temp 98.7°F | Ht 72.0 in | Wt 238.6 lb

## 2022-09-07 DIAGNOSIS — Z8673 Personal history of transient ischemic attack (TIA), and cerebral infarction without residual deficits: Secondary | ICD-10-CM | POA: Diagnosis not present

## 2022-09-07 DIAGNOSIS — Z131 Encounter for screening for diabetes mellitus: Secondary | ICD-10-CM

## 2022-09-07 DIAGNOSIS — I160 Hypertensive urgency: Secondary | ICD-10-CM

## 2022-09-07 DIAGNOSIS — R29898 Other symptoms and signs involving the musculoskeletal system: Secondary | ICD-10-CM

## 2022-09-07 DIAGNOSIS — Z13220 Encounter for screening for lipoid disorders: Secondary | ICD-10-CM

## 2022-09-07 DIAGNOSIS — R519 Headache, unspecified: Secondary | ICD-10-CM

## 2022-09-07 NOTE — Progress Notes (Signed)
Subjective:  Patient ID: Raymond Green, male    DOB: [birth year]  Age: 57 y.o. MRN: [id number]  CC:  Chief Complaint  Patient presents with   Hypertension    Pt states he has been having headaches since  Saturday, and numbness in left arm     HPI Raymond Green presents for   Hypertension: Evaluated at [facility] screening clinic  5 days ago. blood pressure 180/105, after initial reading of 174/108.  Recommended urgent care evaluation.  Note reviewed from urgent care that day.  Typically on losartan and Norvasc but had not taken his medication for that day yet.  Denied any symptoms at that time.  Recommended to continue medication, was refilled, blood pressure log and PCP follow-up.  Usually takes meds in am, just had not taken yet at the screening - 1030am.  Takes amlodipine 5mg , losartan 100mg , bystolic 20mg  (BID), spirinolactone 50mg .   Home BP since Saturday - only high in am before meds - 207/120 this morning,  164/91 yesterday am, 176/102 2 days ago. No readings in meds.  No current NA, but has had intermittent headaches - comes and goes. Sometimes when stressed. HA past few weeks.  No chest pain, dyspnea.  Left arm feels numb in the morning, and some weakness - trouble grasping, lasts about 70minutes. past 2 weeks, intermittent as well. Only in the morning typically. No neck pain, no R arm symptoms. No current HA, arm tingling/numbness or weakness.  No vision changes.  Min wine - 1 drink per day on weekends.  No tobacco, no IDU.     Blood pressure stable at 130/76 at his last visit with me in December 2023. Personal history of mild CAD, family history of CAD.  Prior echo with EF 50 to [id number], grade 1 diastolic dysfunction, mild MR, mild TR, CCTA showed nonobstructive CAD with 1 to 24% calcified plaque in the proximal LAD in August 2020.  Met with cardiology at that time and planned on continue risk factor modification including management of hypertension hyperlipidemia  (Lipitor).  With history of gastric erosion did not recommend use of aspirin.  Home readings: BP Readings from Last 3 Encounters:  09/07/22 138/78  09/02/22 (!) 174/108  09/02/22 (!) 180/105   Lab Results  Component Value Date   CREATININE 1.52 (H) 01/04/2022      History Patient Active Problem List   Diagnosis Date Noted   Chronic rhinitis 11/23/2020   Deviated septum 11/23/2020   Mild coronary artery disease 02/12/2019   Mixed hyperlipidemia 01/14/2019   Chest pain 01/13/2019   Palpitations 01/13/2019   Low back pain 07/13/2016   Abdominal pain, left lower quadrant 05/17/2016   Elevated serum creatinine 05/29/2015   Gout 10/29/2013   Essential hypertension 03/02/2008   GERD 03/02/2008   Iron deficiency anemia 12/13/2007   HEMORRHOIDS-EXTERNAL 12/13/2007   Past Medical History:  Diagnosis Date   Allergy    Anemia    Erosive gastritis 2016   GERD (gastroesophageal reflux disease)    Headache(784.0)    Hemorrhoids    Hyperlipidemia    Hypertension    Kidney insufficiency    LVH (left ventricular hypertrophy)    Dr. Wynonia Lawman ( cardiology) palpitations   Obesity    OSA (obstructive sleep apnea)    Positive PPD, treated 2000   INH   Recurrent upper respiratory infection (URI)    Sleep apnea    uses cpap   Vitamin D deficiency    Past Surgical History:  Procedure  Laterality Date   COLONOSCOPY  12/28/2011   Procedure: COLONOSCOPY;  Surgeon: Milus Banister, MD;  Location: [facility];  Service: Endoscopy;  Laterality: N/A;   HEMORRHOID SURGERY     UPPER GASTROINTESTINAL ENDOSCOPY     Allergies  Allergen Reactions   Lisinopril     angioedema   Shrimp [Shellfish Allergy] Nausea And Vomiting   Prior to Admission medications   Medication Sig Start Date End Date Taking? Authorizing Provider  amLODipine (NORVASC) 5 MG tablet Take 1 tablet (5 mg total) by mouth daily. 03/01/22  Yes Wendie Agreste, MD  atorvastatin (LIPITOR) 40 MG tablet Take 1 tablet (40 mg  total) by mouth daily. 04/06/22  Yes   fluticasone (FLONASE) 50 MCG/ACT nasal spray Place 2 sprays into both nostrils daily. 08/08/22  Yes Larose Kells, MD  losartan (COZAAR) 100 MG tablet Take 1 tablet (100 mg total) by mouth daily. 03/01/22  Yes Wendie Agreste, MD  Nebivolol HCl (BYSTOLIC) 20 MG TABS Take 1 tablet (20 mg total) by mouth daily. 03/01/22  Yes Wendie Agreste, MD  omeprazole (PRILOSEC) 40 MG capsule Take 1 capsule (40 mg total) by mouth daily. 03/01/22  Yes Wendie Agreste, MD  spironolactone (ALDACTONE) 50 MG tablet Take 1 tablet (50 mg total) by mouth daily. 04/06/22  Yes   aluminum chloride (DRYSOL) 20 % external solution Apply topically at bedtime to feet as needed. Patient not taking: Reported on 09/07/2022 01/04/22   Wendie Agreste, MD  amoxicillin-clavulanate (AUGMENTIN) 875-125 MG tablet Take 1 tablet by mouth 2 (two) times daily. Patient not taking: Reported on 09/07/2022 05/22/22   Wendie Agreste, MD  azelastine (ASTELIN) 0.1 % nasal spray Instill 1 spray into both nostrils 2 (two) times daily. Use in each nostril as directed Patient not taking: Reported on 09/07/2022 09/02/22   Emerson Monte, FNP  cetirizine (ZYRTEC ALLERGY) 10 MG tablet Take 1 tablet (10 mg total) by mouth daily. Patient not taking: Reported on 09/07/2022 08/08/22   Larose Kells, MD  predniSONE (DELTASONE) 20 MG tablet Take 1 tablet (20 mg total) by mouth 3 (three) times daily for 3 days, THEN 0.5 tablets (10 mg total) 2 (two) times daily for 3 days, THEN 0.5 tablets (10 mg total) daily for 3 days. Patient not taking: Reported on 09/07/2022 09/02/22 09/13/22  Emerson Monte, FNP   Social History   Socioeconomic History   Marital status: Married    Spouse name: Not on file   Number of children: 0   Years of education: Not on file   Highest education level: Not on file  Occupational History   Occupation: ENDOSCOPY TECHNICIAN    Employer: [employer]  Tobacco Use   Smoking status:  Never   Smokeless tobacco: Never  Vaping Use   Vaping Use: Never used  Substance and Sexual Activity   Alcohol use: No    Alcohol/week: 0.0 standard drinks of alcohol   Drug use: No   Sexual activity: Yes  Other Topics Concern   Not on file  Social History Narrative   Married   Education: Secretary/administrator   Exercise: Yes   Social Determinants of Health   Financial Resource Strain: Not on file  Food Insecurity: Food Insecurity Present (09/02/2022)   Hunger Vital Sign    Worried About Running Out of Food in the Last Year: Sometimes true    Ran Out of Food in the Last Year: Sometimes true  Transportation Needs: No Transportation Needs (  09/02/2022)   PRAPARE - Hydrologist (Medical): No    Lack of Transportation (Non-Medical): No  Physical Activity: Not on file  Stress: Not on file  Social Connections: Not on file  Intimate Partner Violence: Not At Risk (09/02/2022)   Humiliation, Afraid, Rape, and Kick questionnaire    Fear of Current or Ex-Partner: No    Emotionally Abused: No    Physically Abused: No    Sexually Abused: No    Review of Systems   Objective:   Vitals:   09/07/22 1549  BP: 138/78  Pulse: (!) 58  Temp: 98.7 F (37.1 C)  TempSrc: Temporal  SpO2: 98%  Weight: 238 lb 9.6 oz (108.2 kg)  Height: 6' (1.829 m)     Physical Exam Vitals reviewed.  Constitutional:      Appearance: He is well-developed.  HENT:     Head: Normocephalic and atraumatic.  Neck:     Vascular: No carotid bruit or JVD.  Cardiovascular:     Rate and Rhythm: Normal rate and regular rhythm.     Heart sounds: Normal heart sounds. No murmur heard. Pulmonary:     Effort: Pulmonary effort is normal.     Breath sounds: Normal breath sounds. No rales.  Musculoskeletal:     Right lower leg: No edema.     Left lower leg: No edema.  Skin:    General: Skin is warm and dry.  Neurological:     General: No focal deficit present.     Mental Status: He is alert and  oriented to person, place, and time.     Cranial Nerves: No dysarthria or facial asymmetry.     Sensory: No sensory deficit.     Motor: No weakness, tremor or pronator drift.     Coordination: Coordination is intact. Romberg sign negative. Coordination normal. Finger-Nose-Finger Test and Heel to [facility] Test normal. Rapid alternating movements normal.     Gait: Gait normal.     Comments: Pain-free cervical spine range of motion, does not reproduce left arm symptoms.  Equal grip strength, and arm strength left versus right.  Intact sensation and equal to right arm, no dysesthesias on exam currently.  Neurovascular intact distally.  Psychiatric:        Mood and Affect: Mood normal.      Assessment & Plan:  Raymond Green is a 57 y.o. male . Hypertensive urgency - Plan: CBC, Basic metabolic panel, CT HEAD WO CONTRAST (5MM)  Nonintractable episodic headache, unspecified headache type - Plan: CT HEAD WO CONTRAST (5MM)  Left arm weakness - Plan: CBC, CT HEAD WO CONTRAST (5MM)  Uncontrolled hypertension primarily in the morning, improves during the day.  Will adjust the dosing intervals of his medications, have him take amlodipine at night for now, continue twice daily dosing of Bystolic, spironolactone and losartan in the morning.  Asymptomatic in office.  However he has had new headaches as above with intermittent left arm weakness, dysesthesias.  Unable to reproduce with cervical spine exam.  Noted in the morning when his blood pressure has been high.  Currently nonfocal neuroexam.  Timing with hypertension urgency -  check CT head, ER precautions if persistent or worsening symptoms.  Virtual visit follow-up next week with home blood pressure readings.  No orders of the defined types were placed in this encounter.  Patient Instructions  Take amlodipine at night with the second dose bystolic. Continue bystolic, losartan and spirinolactone in the morning. See if  this provides better morning blood  pressure control, but check blood pressure at other times during the day as well.  Keep a record of your blood pressures outside of the office for the next office visit.  CT head ordered, and labs tomorrow - walk - in: [employer] or xray: Walk in 8:30-4:30 during weekdays, no appointment needed [place].  [place], [place] [id number]  Return to the clinic or go to the nearest emergency room if any of your symptoms worsen or new symptoms occur.  Managing Your Hypertension Hypertension, also called high blood pressure, is when the force of the blood pressing against the walls of the arteries is too strong. Arteries are blood vessels that carry blood from your heart throughout your body. Hypertension forces the heart to work harder to pump blood and may cause the arteries to become narrow or stiff. Understanding blood pressure readings A blood pressure reading includes a higher number over a lower number: The first, or top, number is called the systolic pressure. It is a measure of the pressure in your arteries as your heart beats. The second, or bottom number, is called the diastolic pressure. It is a measure of the pressure in your arteries as the heart relaxes. For most people, a normal blood pressure is below 120/80. Your personal target blood pressure may vary depending on your medical conditions, your age, and other factors. Blood pressure is classified into four stages. Based on your blood pressure reading, your health care provider may use the following stages to determine what type of treatment you need, if any. Systolic pressure and diastolic pressure are measured in a unit called millimeters of mercury (mmHg). Normal Systolic pressure: below 123456. Diastolic pressure: below 80. Elevated Systolic pressure: [id number]. Diastolic pressure: below 80. Hypertension stage 1 Systolic pressure: 0000000. Diastolic pressure: XX123456. Hypertension stage 2 Systolic pressure: XX123456 or  above. Diastolic pressure: 90 or above. How can this condition affect me? Managing your hypertension is very important. Over time, hypertension can damage the arteries and decrease blood flow to parts of the body, including the brain, heart, and kidneys. Having untreated or uncontrolled hypertension can lead to: A heart attack. A stroke. A weakened blood vessel (aneurysm). Heart failure. Kidney damage. Eye damage. Memory and concentration problems. Vascular dementia. What actions can I take to manage this condition? Hypertension can be managed by making lifestyle changes and possibly by taking medicines. Your health care provider will help you make a plan to bring your blood pressure within a normal range. You may be referred for counseling on a healthy diet and physical activity. Nutrition  Eat a diet that is high in fiber and potassium, and low in salt (sodium), added sugar, and fat. An example eating plan is called the DASH diet. DASH stands for Dietary Approaches to Stop Hypertension. To eat this way: Eat plenty of fresh fruits and vegetables. Try to fill one-half of your plate at each meal with fruits and vegetables. Eat whole grains, such as whole-wheat pasta, brown rice, or whole-grain bread. Fill about one-fourth of your plate with whole grains. Eat low-fat dairy products. Avoid fatty cuts of meat, processed or cured meats, and poultry with skin. Fill about one-fourth of your plate with lean proteins such as fish, chicken without skin, beans, eggs, and tofu. Avoid pre-made and processed foods. These tend to be higher in sodium, added sugar, and fat. Reduce your daily sodium intake. Many people with hypertension should eat less than 1,500 mg of sodium  a day. Lifestyle  Work with your health care provider to maintain a healthy body weight or to lose weight. Ask what an ideal weight is for you. Get at least 30 minutes of exercise that causes your heart to beat faster (aerobic exercise)  most days of the week. Activities may include walking, swimming, or biking. Include exercise to strengthen your muscles (resistance exercise), such as weight lifting, as part of your weekly exercise routine. Try to do these types of exercises for 30 minutes at least 3 days a week. Do not use any products that contain nicotine or tobacco. These products include cigarettes, chewing tobacco, and vaping devices, such as e-cigarettes. If you need help quitting, ask your health care provider. Control any long-term (chronic) conditions you have, such as high cholesterol or diabetes. Identify your sources of stress and find ways to manage stress. This may include meditation, deep breathing, or making time for fun activities. Alcohol use Do not drink alcohol if: Your health care provider tells you not to drink. You are pregnant, may be pregnant, or are planning to become pregnant. If you drink alcohol: Limit how much you have to: 0-1 drink a day for women. 0-2 drinks a day for men. Know how much alcohol is in your drink. In the [place] one drink equals one 12 oz bottle of beer (355 mL), one 5 oz glass of wine (148 mL), or one 1 oz glass of hard liquor (44 mL). Medicines Your health care provider may prescribe medicine if lifestyle changes are not enough to get your blood pressure under control and if: Your systolic blood pressure is 130 or higher. Your diastolic blood pressure is 80 or higher. Take medicines only as told by your health care provider. Follow the directions carefully. Blood pressure medicines must be taken as told by your health care provider. The medicine does not work as well when you skip doses. Skipping doses also puts you at risk for problems. Monitoring Before you monitor your blood pressure: Do not smoke, drink caffeinated beverages, or exercise within 30 minutes before taking a measurement. Use the bathroom and empty your bladder (urinate). Sit quietly for at least 5 minutes  before taking measurements. Monitor your blood pressure at home as told by your health care provider. To do this: Sit with your back straight and supported. Place your feet flat on the floor. Do not cross your legs. Support your arm on a flat surface, such as a table. Make sure your upper arm is at heart level. Each time you measure, take two or three readings one minute apart and record the results. You may also need to have your blood pressure checked regularly by your health care provider. General information Talk with your health care provider about your diet, exercise habits, and other lifestyle factors that may be contributing to hypertension. Review all the medicines you take with your health care provider because there may be side effects or interactions. Keep all follow-up visits. Your health care provider can help you create and adjust your plan for managing your high blood pressure. Where to find more information National Heart, Lung, and Blood Institute: https://wilson-eaton.com/ American Heart Association: www.heart.org Contact a health care provider if: You think you are having a reaction to medicines you have taken. You have repeated (recurrent) headaches. You feel dizzy. You have swelling in your ankles. You have trouble with your vision. Get help right away if: You develop a severe headache or confusion. You have unusual weakness or numbness,  or you feel faint. You have severe pain in your chest or abdomen. You vomit repeatedly. You have trouble breathing. These symptoms may be an emergency. Get help right away. Call 911. Do not wait to see if the symptoms will go away. Do not drive yourself to the hospital. Summary Hypertension is when the force of blood pumping through your arteries is too strong. If this condition is not controlled, it may put you at risk for serious complications. Your personal target blood pressure may vary depending on your medical conditions, your age,  and other factors. For most people, a normal blood pressure is less than 120/80. Hypertension is managed by lifestyle changes, medicines, or both. Lifestyle changes to help manage hypertension include losing weight, eating a healthy, low-sodium diet, exercising more, stopping smoking, and limiting alcohol. This information is not intended to replace advice given to you by your health care provider. Make sure you discuss any questions you have with your health care provider. Document Revised: 02/17/2021 Document Reviewed: 02/17/2021 Elsevier Patient Education  [place],   Merri Ray, MD [place], [facility] 09/07/22 4:39 PM

## 2022-09-07 NOTE — Patient Instructions (Addendum)
Take amlodipine at night with the second dose bystolic. Continue bystolic, losartan and spirinolactone in the morning. See if this provides better morning blood pressure control, but check blood pressure at other times during the day as well.  Keep a record of your blood pressures outside of the office for the next office visit.  CT head ordered, and labs tomorrow - walk - in: [employer] or xray: Walk in 8:30-4:30 during weekdays, no appointment needed [address].  [place], [place] [address]  Return to the clinic or go to the nearest emergency room if any of your symptoms worsen or new symptoms occur.  Managing Your Hypertension Hypertension, also called high blood pressure, is when the force of the blood pressing against the walls of the arteries is too strong. Arteries are blood vessels that carry blood from your heart throughout your body. Hypertension forces the heart to work harder to pump blood and may cause the arteries to become narrow or stiff. Understanding blood pressure readings A blood pressure reading includes a higher number over a lower number: The first, or top, number is called the systolic pressure. It is a measure of the pressure in your arteries as your heart beats. The second, or bottom number, is called the diastolic pressure. It is a measure of the pressure in your arteries as the heart relaxes. For most people, a normal blood pressure is below 120/80. Your personal target blood pressure may vary depending on your medical conditions, your age, and other factors. Blood pressure is classified into four stages. Based on your blood pressure reading, your health care provider may use the following stages to determine what type of treatment you need, if any. Systolic pressure and diastolic pressure are measured in a unit called millimeters of mercury (mmHg). Normal Systolic pressure: below 123456. Diastolic pressure: below 80. Elevated Systolic pressure: [id number]. Diastolic  pressure: below 80. Hypertension stage 1 Systolic pressure: 0000000. Diastolic pressure: [id number]. Hypertension stage 2 Systolic pressure: [id number] or above. Diastolic pressure: 90 or above. How can this condition affect me? Managing your hypertension is very important. Over time, hypertension can damage the arteries and decrease blood flow to parts of the body, including the brain, heart, and kidneys. Having untreated or uncontrolled hypertension can lead to: A heart attack. A stroke. A weakened blood vessel (aneurysm). Heart failure. Kidney damage. Eye damage. Memory and concentration problems. Vascular dementia. What actions can I take to manage this condition? Hypertension can be managed by making lifestyle changes and possibly by taking medicines. Your health care provider will help you make a plan to bring your blood pressure within a normal range. You may be referred for counseling on a healthy diet and physical activity. Nutrition  Eat a diet that is high in fiber and potassium, and low in salt (sodium), added sugar, and fat. An example eating plan is called the DASH diet. DASH stands for Dietary Approaches to Stop Hypertension. To eat this way: Eat plenty of fresh fruits and vegetables. Try to fill one-half of your plate at each meal with fruits and vegetables. Eat whole grains, such as whole-wheat pasta, brown rice, or whole-grain bread. Fill about one-fourth of your plate with whole grains. Eat low-fat dairy products. Avoid fatty cuts of meat, processed or cured meats, and poultry with skin. Fill about one-fourth of your plate with lean proteins such as fish, chicken without skin, beans, eggs, and tofu. Avoid pre-made and processed foods. These tend to be higher in sodium, added sugar, and  fat. Reduce your daily sodium intake. Many people with hypertension should eat less than 1,500 mg of sodium a day. Lifestyle  Work with your health care provider to maintain a healthy body weight  or to lose weight. Ask what an ideal weight is for you. Get at least 30 minutes of exercise that causes your heart to beat faster (aerobic exercise) most days of the week. Activities may include walking, swimming, or biking. Include exercise to strengthen your muscles (resistance exercise), such as weight lifting, as part of your weekly exercise routine. Try to do these types of exercises for 30 minutes at least 3 days a week. Do not use any products that contain nicotine or tobacco. These products include cigarettes, chewing tobacco, and vaping devices, such as e-cigarettes. If you need help quitting, ask your health care provider. Control any long-term (chronic) conditions you have, such as high cholesterol or diabetes. Identify your sources of stress and find ways to manage stress. This may include meditation, deep breathing, or making time for fun activities. Alcohol use Do not drink alcohol if: Your health care provider tells you not to drink. You are pregnant, may be pregnant, or are planning to become pregnant. If you drink alcohol: Limit how much you have to: 0-1 drink a day for women. 0-2 drinks a day for men. Know how much alcohol is in your drink. In the [place] one drink equals one 12 oz bottle of beer (355 mL), one 5 oz glass of wine (148 mL), or one 1 oz glass of hard liquor (44 mL). Medicines Your health care provider may prescribe medicine if lifestyle changes are not enough to get your blood pressure under control and if: Your systolic blood pressure is 130 or higher. Your diastolic blood pressure is 80 or higher. Take medicines only as told by your health care provider. Follow the directions carefully. Blood pressure medicines must be taken as told by your health care provider. The medicine does not work as well when you skip doses. Skipping doses also puts you at risk for problems. Monitoring Before you monitor your blood pressure: Do not smoke, drink caffeinated beverages, or  exercise within 30 minutes before taking a measurement. Use the bathroom and empty your bladder (urinate). Sit quietly for at least 5 minutes before taking measurements. Monitor your blood pressure at home as told by your health care provider. To do this: Sit with your back straight and supported. Place your feet flat on the floor. Do not cross your legs. Support your arm on a flat surface, such as a table. Make sure your upper arm is at heart level. Each time you measure, take two or three readings one minute apart and record the results. You may also need to have your blood pressure checked regularly by your health care provider. General information Talk with your health care provider about your diet, exercise habits, and other lifestyle factors that may be contributing to hypertension. Review all the medicines you take with your health care provider because there may be side effects or interactions. Keep all follow-up visits. Your health care provider can help you create and adjust your plan for managing your high blood pressure. Where to find more information National Heart, Lung, and Blood Institute: https://wilson-eaton.com/ American Heart Association: www.heart.org Contact a health care provider if: You think you are having a reaction to medicines you have taken. You have repeated (recurrent) headaches. You feel dizzy. You have swelling in your ankles. You have trouble with your vision.  Get help right away if: You develop a severe headache or confusion. You have unusual weakness or numbness, or you feel faint. You have severe pain in your chest or abdomen. You vomit repeatedly. You have trouble breathing. These symptoms may be an emergency. Get help right away. Call 911. Do not wait to see if the symptoms will go away. Do not drive yourself to the hospital. Summary Hypertension is when the force of blood pumping through your arteries is too strong. If this condition is not controlled,  it may put you at risk for serious complications. Your personal target blood pressure may vary depending on your medical conditions, your age, and other factors. For most people, a normal blood pressure is less than 120/80. Hypertension is managed by lifestyle changes, medicines, or both. Lifestyle changes to help manage hypertension include losing weight, eating a healthy, low-sodium diet, exercising more, stopping smoking, and limiting alcohol. This information is not intended to replace advice given to you by your health care provider. Make sure you discuss any questions you have with your health care provider. Document Revised: 02/17/2021 Document Reviewed: 02/17/2021 Elsevier Patient Education  [place].

## 2022-09-08 ENCOUNTER — Ambulatory Visit (HOSPITAL_COMMUNITY)
Admission: RE | Admit: 2022-09-08 | Discharge: 2022-09-08 | Disposition: A | Discharge status: Home / Self Care | Payer: 59 | Source: Ambulatory Visit | Attending: Family Medicine | Admitting: Family Medicine

## 2022-09-08 DIAGNOSIS — I160 Hypertensive urgency: Secondary | ICD-10-CM | POA: Diagnosis not present

## 2022-09-08 DIAGNOSIS — R200 Anesthesia of skin: Secondary | ICD-10-CM | POA: Diagnosis not present

## 2022-09-08 DIAGNOSIS — R29898 Other symptoms and signs involving the musculoskeletal system: Secondary | ICD-10-CM | POA: Diagnosis not present

## 2022-09-08 DIAGNOSIS — R531 Weakness: Secondary | ICD-10-CM | POA: Diagnosis not present

## 2022-09-08 DIAGNOSIS — R519 Headache, unspecified: Secondary | ICD-10-CM | POA: Diagnosis not present

## 2022-09-09 ENCOUNTER — Other Ambulatory Visit: Payer: Self-pay | Admitting: Family Medicine

## 2022-09-09 DIAGNOSIS — R29898 Other symptoms and signs involving the musculoskeletal system: Secondary | ICD-10-CM

## 2022-09-09 DIAGNOSIS — Z8673 Personal history of transient ischemic attack (TIA), and cerebral infarction without residual deficits: Secondary | ICD-10-CM

## 2022-09-09 NOTE — Progress Notes (Signed)
See CT results, referred to neurology for evaluation of intermittent left arm weakness, numbness, CT indicated remote infarct right corona radiata.

## 2022-09-13 ENCOUNTER — Encounter: Payer: Self-pay | Admitting: Family Medicine

## 2022-09-13 ENCOUNTER — Ambulatory Visit (INDEPENDENT_AMBULATORY_CARE_PROVIDER_SITE_OTHER): Payer: 59 | Admitting: Family Medicine

## 2022-09-13 VITALS — BP 118/76 | HR 56 | Temp 98.0°F | Ht 72.0 in | Wt 233.6 lb

## 2022-09-13 DIAGNOSIS — R29898 Other symptoms and signs involving the musculoskeletal system: Secondary | ICD-10-CM

## 2022-09-13 DIAGNOSIS — J342 Deviated nasal septum: Secondary | ICD-10-CM | POA: Diagnosis not present

## 2022-09-13 DIAGNOSIS — Z8673 Personal history of transient ischemic attack (TIA), and cerebral infarction without residual deficits: Secondary | ICD-10-CM

## 2022-09-13 DIAGNOSIS — I160 Hypertensive urgency: Secondary | ICD-10-CM | POA: Diagnosis not present

## 2022-09-13 DIAGNOSIS — Z131 Encounter for screening for diabetes mellitus: Secondary | ICD-10-CM | POA: Diagnosis not present

## 2022-09-13 DIAGNOSIS — Z13220 Encounter for screening for lipoid disorders: Secondary | ICD-10-CM | POA: Diagnosis not present

## 2022-09-13 NOTE — Patient Instructions (Addendum)
Send me a mychart update on home readings with the new meter. Upper arm meter may work better, but make sure that cuff fits your arm for most effective readings. No med changes for now.  Let me know if you do not hear from neurology in the next week.  Continue aspirin for now.  Please have fasting labs drawn within the next week.  I will let you know if there are concerns on  results.  Try the azelastine nasal spray as recommended by allergist.  Continue Flonase.  I am hesitant to restart prednisone for now, especially with the concerns of blood pressure but if you are not achieving any relief with the nasal sprays, discuss repeat prednisone with your ear nose and throat specialist.  Let me know if there are questions.  Take care!

## 2022-09-13 NOTE — Addendum Note (Signed)
Addended by: Merri Ray R on: 09/13/2022 04:53 PM   Modules accepted: Orders

## 2022-09-13 NOTE — Progress Notes (Signed)
Subjective:  Patient ID: Raymond Green, male    DOB: [birth year]  Age: 57 y.o. MRN: [id number]  CC:  Chief Complaint  Patient presents with   Hypertension    Pt notes doing well, notes no physical sxs.     HPI Raymond Green presents for   Hypertension: Seen 6 days ago after hypertensive urgency.  Blood pressure has been elevated day of health screening, had not yet taken meds.  Denies missed meds typically.  Recommended adjustment in timing of meds, amlodipine changed to evening dosing,Continue other meds with Bystolic, spironolactone and losartan in the morning.  Second dose of Bystolic in the evening.  Since last visit Home readings prior to meds - 170/110. That is with taking amlodipine at night.  Evening BP 140/90. Wrist meter. Upper arm meter on order.  Home readings: BP Readings from Last 3 Encounters:  09/13/22 118/76  09/07/22 138/78  09/02/22 (!) 174/108   Lab Results  Component Value Date   CREATININE 1.52 (H) 01/04/2022   Left arm weakness, history of CVA, headaches Discussed at his visit 6 days ago.  Nonfocal neurologic exam in the office, had reported intermittent left arm weakness/dysesthesias.  Unable to reproduce with the cervical spine exam.  Had also noticed some intermittent headaches, thought to be related in part to his elevated blood pressure.  However CT head on 3/22 did indicate remote infarct in the right corona radiata.  No acute infarct, intracranial hemorrhage, or mass noted. Discussed results with patient over the weekend, referred to neurology, and in the interim recommended aspirin 81 mg daily for now. No further arm weakness - feels better.  Tolerating daily ASA. No further HA.   Most recent lipids, A1c below.nonsmoker. not diabetic Lab Results  Component Value Date   HGBA1C 5.6 01/04/2022   Lab Results  Component Value Date   CHOL 150 01/04/2022   HDL 53.40 01/04/2022   LDLCALC 78 01/04/2022   TRIG 92.0 01/04/2022   CHOLHDL 3  01/04/2022   Plan for laser surgery for nasal polyps. Improved in past with prednisone.  Trouble sleeping at night d/t nasal congestion - using flonase daily, not azelastine. Allergy visit noted 08/08/22.      History Patient Active Problem List   Diagnosis Date Noted   Chronic rhinitis 11/23/2020   Deviated septum 11/23/2020   Mild coronary artery disease 02/12/2019   Mixed hyperlipidemia 01/14/2019   Chest pain 01/13/2019   Palpitations 01/13/2019   Low back pain 07/13/2016   Abdominal pain, left lower quadrant 05/17/2016   Elevated serum creatinine 05/29/2015   Gout 10/29/2013   Essential hypertension 03/02/2008   GERD 03/02/2008   Iron deficiency anemia 12/13/2007   HEMORRHOIDS-EXTERNAL 12/13/2007   Past Medical History:  Diagnosis Date   Allergy    Anemia    Erosive gastritis 2016   GERD (gastroesophageal reflux disease)    Headache(784.0)    Hemorrhoids    Hyperlipidemia    Hypertension    Kidney insufficiency    LVH (left ventricular hypertrophy)    Dr. Wynonia Lawman ( cardiology) palpitations   Obesity    OSA (obstructive sleep apnea)    Positive PPD, treated 2000   INH   Recurrent upper respiratory infection (URI)    Sleep apnea    uses cpap   Vitamin D deficiency    Past Surgical History:  Procedure Laterality Date   COLONOSCOPY  12/28/2011   Procedure: COLONOSCOPY;  Surgeon: Milus Banister, MD;  Location: [facility];  Service: Endoscopy;  Laterality: N/A;   HEMORRHOID SURGERY     UPPER GASTROINTESTINAL ENDOSCOPY     Allergies  Allergen Reactions   Lisinopril     angioedema   Shrimp [Shellfish Allergy] Nausea And Vomiting   Prior to Admission medications   Medication Sig Start Date End Date Taking? Authorizing Provider  amLODipine (NORVASC) 5 MG tablet Take 1 tablet (5 mg total) by mouth daily. 03/01/22  Yes Wendie Agreste, MD  atorvastatin (LIPITOR) 40 MG tablet Take 1 tablet (40 mg total) by mouth daily. 04/06/22  Yes   fluticasone (FLONASE) 50  MCG/ACT nasal spray Place 2 sprays into both nostrils daily. 08/08/22  Yes Larose Kells, MD  losartan (COZAAR) 100 MG tablet Take 1 tablet (100 mg total) by mouth daily. 03/01/22  Yes Wendie Agreste, MD  Nebivolol HCl (BYSTOLIC) 20 MG TABS Take 1 tablet (20 mg total) by mouth daily. 03/01/22  Yes Wendie Agreste, MD  omeprazole (PRILOSEC) 40 MG capsule Take 1 capsule (40 mg total) by mouth daily. 03/01/22  Yes Wendie Agreste, MD  predniSONE (DELTASONE) 20 MG tablet Take 1 tablet (20 mg total) by mouth 3 (three) times daily for 3 days, THEN 0.5 tablets (10 mg total) 2 (two) times daily for 3 days, THEN 0.5 tablets (10 mg total) daily for 3 days. 09/02/22 09/13/22 Yes Avegno, Darrelyn Hillock, FNP  spironolactone (ALDACTONE) 50 MG tablet Take 1 tablet (50 mg total) by mouth daily. 04/06/22  Yes    Social History   Socioeconomic History   Marital status: Married    Spouse name: Not on file   Number of children: 0   Years of education: Not on file   Highest education level: Not on file  Occupational History   Occupation: ENDOSCOPY TECHNICIAN    Employer: [employer]  Tobacco Use   Smoking status: Never   Smokeless tobacco: Never  Vaping Use   Vaping Use: Never used  Substance and Sexual Activity   Alcohol use: No    Alcohol/week: 0.0 standard drinks of alcohol   Drug use: No   Sexual activity: Yes  Other Topics Concern   Not on file  Social History Narrative   Married   Education: Secretary/administrator   Exercise: Yes   Social Determinants of Health   Financial Resource Strain: Not on file  Food Insecurity: Food Insecurity Present (09/02/2022)   Hunger Vital Sign    Worried About Running Out of Food in the Last Year: Sometimes true    Ran Out of Food in the Last Year: Sometimes true  Transportation Needs: No Transportation Needs (09/02/2022)   PRAPARE - Hydrologist (Medical): No    Lack of Transportation (Non-Medical): No  Physical Activity: Not on file  Stress:  Not on file  Social Connections: Not on file  Intimate Partner Violence: Not At Risk (09/02/2022)   Humiliation, Afraid, Rape, and Kick questionnaire    Fear of Current or Ex-Partner: No    Emotionally Abused: No    Physically Abused: No    Sexually Abused: No    Review of Systems  Per HPI  Objective:   Vitals:   09/13/22 1557  BP: 118/76  Pulse: (!) 56  Temp: 98 F (36.7 C)  TempSrc: Temporal  SpO2: 98%  Weight: 233 lb 9.6 oz (106 kg)  Height: 6' (1.829 m)     Physical Exam Vitals reviewed.  Constitutional:      Appearance: He is  well-developed.  HENT:     Head: Normocephalic and atraumatic.  Neck:     Vascular: No carotid bruit or JVD.  Cardiovascular:     Rate and Rhythm: Normal rate and regular rhythm.     Heart sounds: Normal heart sounds. No murmur heard. Pulmonary:     Effort: Pulmonary effort is normal.     Breath sounds: Normal breath sounds. No rales.  Musculoskeletal:     Right lower leg: No edema.     Left lower leg: No edema.  Skin:    General: Skin is warm and dry.  Neurological:     Mental Status: He is alert and oriented to person, place, and time.  Psychiatric:        Mood and Affect: Mood normal.      Assessment & Plan:  Raymond Green is a 57 y.o. male . Left arm weakness History of cerebral infarction - Plan: Comprehensive metabolic panel, Lipid panel, Hemoglobin A1c  -CT with remote infarct of right corona radiata.  Prior left arm weakness has resolved, headaches have resolved.  Intermittent left arm symptoms previously.  Not sure this is related to the findings on CT but will have him meet with neurology to discuss if further testing including echo or carotid Dopplers needed.  For now we will look into other secondary prevention including A1c, lipid panel and continue to monitor blood pressure control.  See below.  RTC/ER precautions given, tolerating aspirin currently.   Hypertensive urgency - Plan: Comprehensive metabolic  panel  -Well-controlled in office, question accuracy of home blood pressure wrist meter. New meter on order. appropriate cuff size discussed for upper arm to make sure it fits, home monitoring with update on readings over the next week or 2.  No med changes for now.  If persistent difficulty with control, would recommend evaluation with advanced hypertension clinic.  1 month follow-up. Nasal septal deviation  Screening for diabetes mellitus - Plan: Hemoglobin A1c Screening for hyperlipidemia - Plan: Lipid panel  -Secondary prevention discussed as above, check fasting labs in the next week, order placed.  No orders of the defined types were placed in this encounter.  Patient Instructions  Send me a mychart update on home readings with the new meter. Upper arm meter may work better, but make sure that cuff fits your arm for most effective readings. No med changes for now.  Let me know if you do not hear from neurology in the next week.  Continue aspirin for now.  Please have fasting labs drawn within the next week.  I will let you know if there are concerns on  results.  Try the azelastine nasal spray as recommended by allergist.  Continue Flonase.  I am hesitant to restart prednisone for now, especially with the concerns of blood pressure but if you are not achieving any relief with the nasal sprays, discuss repeat prednisone with your ear nose and throat specialist.  Let me know if there are questions.  Take care!       Signed,   Merri Ray, MD [place] 09/13/22 5:15 PM

## 2022-09-18 ENCOUNTER — Other Ambulatory Visit (INDEPENDENT_AMBULATORY_CARE_PROVIDER_SITE_OTHER): Payer: 59

## 2022-09-18 DIAGNOSIS — I160 Hypertensive urgency: Secondary | ICD-10-CM | POA: Diagnosis not present

## 2022-09-18 DIAGNOSIS — Z8673 Personal history of transient ischemic attack (TIA), and cerebral infarction without residual deficits: Secondary | ICD-10-CM

## 2022-09-18 DIAGNOSIS — Z13220 Encounter for screening for lipoid disorders: Secondary | ICD-10-CM

## 2022-09-18 DIAGNOSIS — R29898 Other symptoms and signs involving the musculoskeletal system: Secondary | ICD-10-CM | POA: Diagnosis not present

## 2022-09-18 DIAGNOSIS — Z131 Encounter for screening for diabetes mellitus: Secondary | ICD-10-CM | POA: Diagnosis not present

## 2022-09-18 LAB — LIPID PANEL
Cholesterol: 173 mg/dL (ref 0–200)
HDL: 61 mg/dL (ref >39.00)
LDL Cholesterol: 89 mg/dL (ref 0–99)
NonHDL: 111.55
Total CHOL/HDL Ratio: 3
Triglycerides: 115 mg/dL (ref 0.0–149.0)
VLDL: 23 mg/dL (ref 0.0–40.0)

## 2022-09-18 LAB — HEMOGLOBIN A1C: Hgb A1c MFr Bld: 5.9 % (ref 4.6–6.5)

## 2022-09-18 LAB — COMPREHENSIVE METABOLIC PANEL
ALT: 12 U/L (ref 0–53)
AST: 18 U/L (ref 0–37)
Albumin: 4.3 g/dL (ref 3.5–5.2)
Alkaline Phosphatase: 43 U/L (ref 39–117)
BUN: 17 mg/dL (ref 6–23)
CO2: 30 mEq/L (ref 19–32)
Calcium: 9.9 mg/dL (ref 8.4–10.5)
Chloride: 101 mEq/L (ref 96–112)
Creatinine, Ser: 1.56 mg/dL — ABNORMAL HIGH (ref 0.40–1.50)
GFR: 49.39 mL/min — ABNORMAL LOW (ref >60.00)
Glucose, Bld: 90 mg/dL (ref 70–99)
Potassium: 4.1 mEq/L (ref 3.5–5.1)
Sodium: 135 mEq/L (ref 135–145)
Total Bilirubin: 0.9 mg/dL (ref 0.2–1.2)
Total Protein: 7.4 g/dL (ref 6.0–8.3)

## 2022-09-18 LAB — CBC
HCT: 39.4 % (ref 39.0–52.0)
Hemoglobin: 13.4 g/dL (ref 13.0–17.0)
MCHC: 33.9 g/dL (ref 30.0–36.0)
MCV: 89.2 fl (ref 78.0–100.0)
Platelets: 166 10*3/uL (ref 150.0–400.0)
RBC: 4.42 Mil/uL (ref 4.22–5.81)
RDW: 14 % (ref 11.5–15.5)
WBC: 5.9 10*3/uL (ref 4.0–10.5)

## 2022-09-25 DIAGNOSIS — N182 Chronic kidney disease, stage 2 (mild): Secondary | ICD-10-CM | POA: Diagnosis not present

## 2022-09-25 DIAGNOSIS — I10 Essential (primary) hypertension: Secondary | ICD-10-CM | POA: Diagnosis not present

## 2022-09-25 DIAGNOSIS — E785 Hyperlipidemia, unspecified: Secondary | ICD-10-CM | POA: Diagnosis not present

## 2022-09-25 DIAGNOSIS — K279 Peptic ulcer, site unspecified, unspecified as acute or chronic, without hemorrhage or perforation: Secondary | ICD-10-CM | POA: Diagnosis not present

## 2022-09-26 ENCOUNTER — Other Ambulatory Visit (HOSPITAL_COMMUNITY): Payer: Self-pay

## 2022-09-26 LAB — LAB REPORT - SCANNED
Albumin, Urine POC: 16.4
Creatinine, POC: 311.8 mg/dL
EGFR: 47
Microalb Creat Ratio: 5
Protein/Creatinine Ratio: 52

## 2022-09-26 MED ORDER — SPIRONOLACTONE 25 MG PO TABS
25.0000 mg | ORAL_TABLET | Freq: Every day | ORAL | 3 refills | Status: DC
  Filled 2022-09-26: qty 90, 90d supply, fill #0
  Filled 2023-01-04: qty 90, 90d supply, fill #1
  Filled 2023-03-28: qty 90, 90d supply, fill #2
  Filled 2023-07-13 (×2): qty 90, 90d supply, fill #3

## 2022-09-27 ENCOUNTER — Telehealth: Payer: Self-pay | Admitting: Family Medicine

## 2022-09-27 NOTE — Telephone Encounter (Signed)
Received leave of absence forms from [employer]. Placed forms in front bin with charge sheet.

## 2022-09-29 ENCOUNTER — Telehealth: Payer: Self-pay | Admitting: Family Medicine

## 2022-09-29 NOTE — Telephone Encounter (Signed)
FMLA paperwork reviewed, please obtain some details of when he was out, when did he return or planned return to work.  I can discuss with him further details if needed based on that phone call and can complete his paperwork next week.  Thank you.

## 2022-10-02 ENCOUNTER — Telehealth: Payer: Self-pay | Admitting: Family Medicine

## 2022-10-02 NOTE — Telephone Encounter (Signed)
FMLA paperwork - charge sheet attached - placed I front bin.

## 2022-10-02 NOTE — Telephone Encounter (Signed)
Forms are in folder

## 2022-10-02 NOTE — Telephone Encounter (Signed)
Spoke with patient start date out of work will be 10/30/2022 until 11/14/2022.

## 2022-10-04 ENCOUNTER — Other Ambulatory Visit: Payer: 59 | Admitting: Gastroenterology

## 2022-10-04 ENCOUNTER — Other Ambulatory Visit: Payer: Self-pay | Admitting: Family Medicine

## 2022-10-04 ENCOUNTER — Other Ambulatory Visit: Payer: Self-pay

## 2022-10-04 ENCOUNTER — Other Ambulatory Visit (HOSPITAL_COMMUNITY): Payer: Self-pay

## 2022-10-04 ENCOUNTER — Ambulatory Visit (AMBULATORY_SURGERY_CENTER): Payer: 59 | Admitting: Gastroenterology

## 2022-10-04 ENCOUNTER — Encounter: Payer: Self-pay | Admitting: Gastroenterology

## 2022-10-04 VITALS — BP 180/90 | HR 43 | Temp 98.2°F | Resp 16 | Ht 75.0 in | Wt 230.0 lb

## 2022-10-04 DIAGNOSIS — K219 Gastro-esophageal reflux disease without esophagitis: Secondary | ICD-10-CM

## 2022-10-04 DIAGNOSIS — K625 Hemorrhage of anus and rectum: Secondary | ICD-10-CM

## 2022-10-04 DIAGNOSIS — I10 Essential (primary) hypertension: Secondary | ICD-10-CM

## 2022-10-04 MED ORDER — SODIUM CHLORIDE 0.9 % IV SOLN
500.0000 mL | Freq: Once | INTRAVENOUS | Status: DC
Start: 2022-10-04 — End: 2022-10-04

## 2022-10-04 MED ORDER — NEBIVOLOL HCL 20 MG PO TABS
1.0000 | ORAL_TABLET | Freq: Every day | ORAL | 1 refills | Status: DC
Start: 2022-10-04 — End: 2023-03-28
  Filled 2022-10-04: qty 90, 90d supply, fill #0
  Filled 2023-01-04: qty 90, 90d supply, fill #1

## 2022-10-04 MED ORDER — OMEPRAZOLE 40 MG PO CPDR
40.0000 mg | DELAYED_RELEASE_CAPSULE | Freq: Every day | ORAL | 1 refills | Status: DC
Start: 2022-10-04 — End: 2023-03-28
  Filled 2022-10-04: qty 90, 90d supply, fill #0
  Filled 2023-01-04: qty 90, 90d supply, fill #1

## 2022-10-04 MED ORDER — LOSARTAN POTASSIUM 100 MG PO TABS
100.0000 mg | ORAL_TABLET | Freq: Every day | ORAL | 1 refills | Status: DC
Start: 2022-10-04 — End: 2023-03-28
  Filled 2022-10-04: qty 90, 90d supply, fill #0
  Filled 2023-01-04: qty 90, 90d supply, fill #1

## 2022-10-04 MED ORDER — AMLODIPINE BESYLATE 5 MG PO TABS
5.0000 mg | ORAL_TABLET | Freq: Every day | ORAL | 1 refills | Status: DC
Start: 2022-10-04 — End: 2023-03-28
  Filled 2022-10-04: qty 90, 90d supply, fill #0
  Filled 2023-01-04: qty 90, 90d supply, fill #1

## 2022-10-04 NOTE — Progress Notes (Unsigned)
GASTROENTEROLOGY PROCEDURE H&P NOTE   Primary Care Physician: Shade Flood, MD  HPI: Raymond Green is a 57 y.o. male who presents for Sigmoidoscopy for evaluation of rectal bleeding.  Past Medical History:  Diagnosis Date   Allergy    Anemia    Erosive gastritis 2016   GERD (gastroesophageal reflux disease)    Headache(784.0)    Hemorrhoids    Hyperlipidemia    Hypertension    Kidney insufficiency    LVH (left ventricular hypertrophy)    Dr. Donnie Aho ( cardiology) palpitations   Obesity    OSA (obstructive sleep apnea)    Positive PPD, treated 2000   INH   Recurrent upper respiratory infection (URI)    Sleep apnea    uses cpap   Vitamin D deficiency    Past Surgical History:  Procedure Laterality Date   COLONOSCOPY  12/28/2011   Procedure: COLONOSCOPY;  Surgeon: [name], MD;  Location: [facility];  Service: Endoscopy;  Laterality: N/A;   COLONOSCOPY     HEMORRHOID SURGERY     UPPER GASTROINTESTINAL ENDOSCOPY     Current Outpatient Medications  Medication Sig Dispense Refill   amLODipine (NORVASC) 5 MG tablet Take 1 tablet (5 mg total) by mouth daily. 90 tablet 1   aspirin EC 81 MG tablet Take 81 mg by mouth daily. Swallow whole.     atorvastatin (LIPITOR) 40 MG tablet Take 1 tablet (40 mg total) by mouth daily. 90 tablet 0   fluticasone (FLONASE) 50 MCG/ACT nasal spray Place 2 sprays into both nostrils daily. 16 g 5   losartan (COZAAR) 100 MG tablet Take 1 tablet (100 mg total) by mouth daily. 90 tablet 1   Nebivolol HCl (BYSTOLIC) 20 MG TABS Take 1 tablet (20 mg total) by mouth daily. 90 tablet 1   omeprazole (PRILOSEC) 40 MG capsule Take 1 capsule (40 mg total) by mouth daily. 90 capsule 1   spironolactone (ALDACTONE) 25 MG tablet Take 1 tablet (25 mg total) by mouth daily. 90 tablet 3   spironolactone (ALDACTONE) 50 MG tablet Take 1 tablet (50 mg total) by mouth daily. (Patient not taking: Reported on 10/04/2022) 90 tablet 0   Current  Facility-Administered Medications  Medication Dose Route Frequency Provider Last Rate Last Admin   0.9 %  sodium chloride infusion  500 mL Intravenous Once Mansouraty, Netty Starring., MD        Current Outpatient Medications:    amLODipine (NORVASC) 5 MG tablet, Take 1 tablet (5 mg total) by mouth daily., Disp: 90 tablet, Rfl: 1   aspirin EC 81 MG tablet, Take 81 mg by mouth daily. Swallow whole., Disp: , Rfl:    atorvastatin (LIPITOR) 40 MG tablet, Take 1 tablet (40 mg total) by mouth daily., Disp: 90 tablet, Rfl: 0   fluticasone (FLONASE) 50 MCG/ACT nasal spray, Place 2 sprays into both nostrils daily., Disp: 16 g, Rfl: 5   losartan (COZAAR) 100 MG tablet, Take 1 tablet (100 mg total) by mouth daily., Disp: 90 tablet, Rfl: 1   Nebivolol HCl (BYSTOLIC) 20 MG TABS, Take 1 tablet (20 mg total) by mouth daily., Disp: 90 tablet, Rfl: 1   omeprazole (PRILOSEC) 40 MG capsule, Take 1 capsule (40 mg total) by mouth daily., Disp: 90 capsule, Rfl: 1   spironolactone (ALDACTONE) 25 MG tablet, Take 1 tablet (25 mg total) by mouth daily., Disp: 90 tablet, Rfl: 3   spironolactone (ALDACTONE) 50 MG tablet, Take 1 tablet (50 mg total) by mouth  daily. (Patient not taking: Reported on 10/04/2022), Disp: 90 tablet, Rfl: 0  Current Facility-Administered Medications:    0.9 %  sodium chloride infusion, 500 mL, Intravenous, Once, Mansouraty, Netty Starring., MD Allergies  Allergen Reactions   Lisinopril     angioedema   Shrimp [Shellfish Allergy] Nausea And Vomiting   Family History  Problem Relation Age of Onset   Diabetes Mother    Heart disease Mother    Hypertension Mother    Hypertension Father    Diabetes Sister    Colon cancer Neg Hx    Colon polyps Neg Hx    Esophageal cancer Neg Hx    Rectal cancer Neg Hx    Stomach cancer Neg Hx    Inflammatory bowel disease Neg Hx    Liver disease Neg Hx    Pancreatic cancer Neg Hx    Social History   Socioeconomic History   Marital status: Married     Spouse name: Not on file   Number of children: 0   Years of education: Not on file   Highest education level: Not on file  Occupational History   Occupation: ENDOSCOPY TECHNICIAN    Employer: [employer]  Tobacco Use   Smoking status: Never   Smokeless tobacco: Never  Vaping Use   Vaping Use: Never used  Substance and Sexual Activity   Alcohol use: Yes    Comment: occasional   Drug use: No   Sexual activity: Yes  Other Topics Concern   Not on file  Social History Narrative   Married   Education: Automotive engineer   Exercise: Yes   Social Determinants of Health   Financial Resource Strain: Not on file  Food Insecurity: Food Insecurity Present (09/02/2022)   Hunger Vital Sign    Worried About Running Out of Food in the Last Year: Sometimes true    Ran Out of Food in the Last Year: Sometimes true  Transportation Needs: No Transportation Needs (09/02/2022)   PRAPARE - Administrator, Civil Service (Medical): No    Lack of Transportation (Non-Medical): No  Physical Activity: Not on file  Stress: Not on file  Social Connections: Not on file  Intimate Partner Violence: Not At Risk (09/02/2022)   Humiliation, Afraid, Rape, and Kick questionnaire    Fear of Current or Ex-Partner: No    Emotionally Abused: No    Physically Abused: No    Sexually Abused: No    Physical Exam: Today's Vitals   10/04/22 1426 10/04/22 1433  BP: (!) 146/90   Pulse: (!) 46   Temp: 98.2 F (36.8 C) 98.2 F (36.8 C)  TempSrc: Skin   SpO2: 100%   Weight: 230 lb (104.3 kg)   Height: 6\' 3"  (1.905 m)    Body mass index is 28.75 kg/m. GEN: NAD EYE: Sclerae anicteric ENT: MMM CV: Non-tachycardic GI: Soft, NT/ND NEURO:  Alert & Oriented x 3  Lab Results: No results for input(s): "WBC", "HGB", "HCT", "PLT" in the last 72 hours. BMET No results for input(s): "NA", "K", "CL", "CO2", "GLUCOSE", "BUN", "CREATININE", "CALCIUM" in the last 72 hours. LFT No results for input(s): "PROT",  "ALBUMIN", "AST", "ALT", "ALKPHOS", "BILITOT", "BILIDIR", "IBILI" in the last 72 hours. PT/INR No results for input(s): "LABPROT", "INR" in the last 72 hours.   Impression / Plan: This is a 57 y.o.male who presents for Sigmoidoscopy for evaluation of rectal bleeding.  The risks and benefits of endoscopic evaluation/treatment were discussed with the patient and/or family; these include  but are not limited to the risk of perforation, infection, bleeding, missed lesions, lack of diagnosis, severe illness requiring hospitalization, as well as anesthesia and sedation related illnesses.  The patient's history has been reviewed, patient examined, no change in status, and deemed stable for procedure.  The patient and/or family is agreeable to proceed.    Corliss Parish, MD [facility] Office # [phone]

## 2022-10-04 NOTE — Op Note (Signed)
[facility] Patient Name: Raymond Green Procedure Date: 10/04/2022 4:37 PM MRN: [id number] Endoscopist: Corliss Parish , MD, [id number] Age: 57 Referring MD:  Date of Birth: 12/15/1965 Gender: Male Account #: [id number] Procedure:                Flexible Sigmoidoscopy Indications:              Hematochezia Medicines:                None Procedure:                Pre-Anesthesia Assessment:                           - Prior to the procedure, a History and Physical                            was performed, and patient medications and                            allergies were reviewed. The patient's tolerance of                            previous anesthesia was also reviewed. The risks                            and benefits of the procedure and the sedation                            options and risks were discussed with the patient.                            All questions were answered, and informed consent                            was obtained. Prior Anticoagulants: The patient has                            taken no anticoagulant or antiplatelet agents                            except for aspirin. ASA Grade Assessment: II - A                            patient with mild systemic disease. After reviewing                            the risks and benefits, the patient was deemed in                            satisfactory condition to undergo the procedure.                           After obtaining informed consent, the scope was  passed under direct vision. The Olympus PCF-H190DL                            (#[id number]) Colonoscope was introduced through the                            anus and advanced to the the left transverse colon.                            The flexible sigmoidoscopy was accomplished without                            difficulty. The patient tolerated the procedure.                            The quality of the bowel  preparation was adequate. Scope In: Scope Out: Findings:                 Skin tags were found on perianal exam.                           The digital rectal exam findings include                            hemorrhoids. Pertinent negatives include no                            palpable rectal lesions.                           A large amount of semi-liquid semi-solid stool was                            found in the entire colon, interfering with                            visualization. Lavage of the area was performed                            using copious amounts, resulting in clearance with                            adequate visualization.                           Normal mucosa was found in the rectum, in the                            recto-sigmoid colon, in the sigmoid colon and in                            the descending colon.                           Non-bleeding non-thrombosed external and internal  hemorrhoids were found during retroflexion, during                            perianal exam and during digital exam. The                            hemorrhoids were Grade III (internal hemorrhoids                            that prolapse but require manual reduction). Complications:            No immediate complications. Estimated Blood Loss:     Estimated blood loss: none. Impression:               - Perianal skin tags found on perianal exam.                           - Hemorrhoids found on digital rectal exam.                           - Stool in the entire examined colon.                           - Normal mucosa in the rectum, in the recto-sigmoid                            colon, in the sigmoid colon and in the descending                            colon.                           - Non-bleeding non-thrombosed external and internal                            hemorrhoids. Recommendation:           - The patient will be observed post-procedure,                             until all discharge criteria are met.                           - Discharge patient to home.                           - Patient has a contact number available for                            emergencies. The signs and symptoms of potential                            delayed complications were discussed with the                            patient. Return to normal activities tomorrow.  Written discharge instructions were provided to the                            patient.                           - High fiber diet.                           - Use FiberCon 1-2 tablets PO daily.                           - If patient has recurrent episodes of bleeding,                            will plan retreatment with Anusol suppositories +/-                            Calmol-4 suppositories.                           - Can consider hemorrhoidal banding in the future.                           - The findings and recommendations were discussed                            with the patient. Corliss Parish, MD 10/04/2022 5:00:55 PM

## 2022-10-04 NOTE — Progress Notes (Unsigned)
Cell phone and watch off per pt Per pt- he usually runs a low heart rate

## 2022-10-04 NOTE — Patient Instructions (Signed)
-   High fiber diet. - Use FiberCon 1-2 tablets PO daily. - If patient has recurrent episodes of bleeding, will plan retreatment with Anusol suppositories +/-   Calmol-4 suppositories.  - Can consider hemorrhoidal banding in the future. - The findings and recommendations were discussed with the patient. -Handout on hemorrhoids, hemorrhoid banding and high fiber diet provided   YOU HAD AN ENDOSCOPIC PROCEDURE TODAY AT THE [facility]:   Refer to the procedure report that was given to you for any specific questions about what was found during the examination.  If the procedure report does not answer your questions, please call your gastroenterologist to clarify.  If you requested that your care partner not be given the details of your procedure findings, then the procedure report has been included in a sealed envelope for you to review at your convenience later.  YOU SHOULD EXPECT: Some feelings of bloating in the abdomen. Passage of more gas than usual.  Walking can help get rid of the air that was put into your GI tract during the procedure and reduce the bloating. If you had a lower endoscopy (such as a colonoscopy or flexible sigmoidoscopy) you may notice spotting of blood in your stool or on the toilet paper. If you underwent a bowel prep for your procedure, you may not have a normal bowel movement for a few days.  Please Note:  You might notice some irritation and congestion in your nose or some drainage.  This is from the oxygen used during your procedure.  There is no need for concern and it should clear up in a day or so.  SYMPTOMS TO REPORT IMMEDIATELY:  Following lower endoscopy (colonoscopy or flexible sigmoidoscopy):  Excessive amounts of blood in the stool  Significant tenderness or worsening of abdominal pains  Swelling of the abdomen that is new, acute  Fever of 100F or higher   For urgent or emergent issues, a gastroenterologist can be reached at any hour by calling  [phone]. Do not use MyChart messaging for urgent concerns.    DIET:  We do recommend a small meal at first, but then you may proceed to your regular diet.  Drink plenty of fluids but you should avoid alcoholic beverages for 24 hours.  ACTIVITY:  You should plan to take it easy for the rest of today and you should NOT DRIVE or use heavy machinery until tomorrow (because of the sedation medicines used during the test).    FOLLOW UP: Our staff will call the number listed on your records the next business day following your procedure.  We will call around 7:15- 8:00 am to check on you and address any questions or concerns that you may have regarding the information given to you following your procedure. If we do not reach you, we will leave a message.     If any biopsies were taken you will be contacted by phone or by letter within the next 1-3 weeks.  Please call us at [phone] if you have not heard about the biopsies in 3 weeks.    SIGNATURES/CONFIDENTIALITY: You and/or your care partner have signed paperwork which will be entered into your electronic medical record.  These signatures attest to the fact that that the information above on your After Visit Summary has been reviewed and is understood.  Full responsibility of the confidentiality of this discharge information lies with you and/or your care-partner.

## 2022-10-04 NOTE — Progress Notes (Unsigned)
A/ox3, pleased with MAC, report to RN 

## 2022-10-05 ENCOUNTER — Telehealth: Payer: Self-pay

## 2022-10-05 NOTE — Telephone Encounter (Signed)
I received more FMLA forms. Placed in front bin with charge sheet.

## 2022-10-05 NOTE — Telephone Encounter (Signed)
  Follow up Call-     10/04/2022    2:33 PM 01/11/2022   10:04 AM  Call back number  Post procedure Call Back phone  # [phone]  Permission to leave phone message Yes Yes     Patient questions:  Do you have a fever, pain , or abdominal swelling? No. Pain Score  0 *  Have you tolerated food without any problems? Yes.    Have you been able to return to your normal activities? Yes.    Do you have any questions about your discharge instructions: Diet   No. Medications  No. Follow up visit  No.  Do you have questions or concerns about your Care? No.  Actions: * If pain score is 4 or above: No action needed, pain <4.

## 2022-10-06 DIAGNOSIS — Z0279 Encounter for issue of other medical certificate: Secondary | ICD-10-CM

## 2022-10-06 NOTE — Telephone Encounter (Signed)
Called patient. FMLA paperwork will be for time out dureing your nasal polypectomy. Surgery will be on My 13th. Plans on working through May 12th, then off for 2 weeks for surgery/recovery.  Surgery with Dr. Pollyann Kennedy - paperwork sent to his office as well but has not yet received. Plan for out of work for 2 weeks, first day back on May 28th.  Paperwork completed, but will notate that it is up to his surgeon regarding time out of work and if further time needed should be determined on their paperwork.  FMLA paperwork completed and placed in fax bin at nurse desk.

## 2022-10-09 ENCOUNTER — Telehealth: Payer: Self-pay

## 2022-10-10 NOTE — Telephone Encounter (Signed)
See phone note

## 2022-10-24 ENCOUNTER — Encounter: Payer: Self-pay | Admitting: Neurology

## 2022-10-24 ENCOUNTER — Telehealth: Payer: Self-pay | Admitting: Neurology

## 2022-10-24 ENCOUNTER — Ambulatory Visit: Payer: 59 | Admitting: Neurology

## 2022-10-24 NOTE — Telephone Encounter (Signed)
Pt needs clearance from Dr for surgery 5/13 for a nasal polypectomy. This is needed in order to do his surgery so he would like to speak to someone. His surgery was already rescheduled and he wants to make sure he isn't delayed.

## 2022-10-24 NOTE — Telephone Encounter (Signed)
Pt came into the office and spoke with the front desk. Pt was advised that at this time I dont have anything prior to his scheduled surgery that I can offer.  He is asking that you or someone call once you speak with Dohmeier if she is/isn't able to be put on schedule this week. We would need to call the surgery at [facility] [phone] Albin Felling Asked if once decision is made from Dohmeier if someone would also contact surgery scheduler.

## 2022-10-25 ENCOUNTER — Ambulatory Visit: Payer: 59 | Admitting: Neurology

## 2022-10-25 ENCOUNTER — Encounter: Payer: Self-pay | Admitting: Neurology

## 2022-10-25 VITALS — BP 146/81 | HR 43 | Ht 72.0 in | Wt 226.0 lb

## 2022-10-25 DIAGNOSIS — I10 Essential (primary) hypertension: Secondary | ICD-10-CM | POA: Diagnosis not present

## 2022-10-25 DIAGNOSIS — I63331 Cerebral infarction due to thrombosis of right posterior cerebral artery: Secondary | ICD-10-CM

## 2022-10-25 DIAGNOSIS — D508 Other iron deficiency anemias: Secondary | ICD-10-CM

## 2022-10-25 DIAGNOSIS — Z8673 Personal history of transient ischemic attack (TIA), and cerebral infarction without residual deficits: Secondary | ICD-10-CM | POA: Diagnosis not present

## 2022-10-25 DIAGNOSIS — J329 Chronic sinusitis, unspecified: Secondary | ICD-10-CM | POA: Diagnosis not present

## 2022-10-25 DIAGNOSIS — G4733 Obstructive sleep apnea (adult) (pediatric): Secondary | ICD-10-CM | POA: Diagnosis not present

## 2022-10-25 DIAGNOSIS — J339 Nasal polyp, unspecified: Secondary | ICD-10-CM

## 2022-10-25 NOTE — Progress Notes (Signed)
[facility]  Provider:  Dr Radonna Bracher Referring Provider: Shade Flood, MD Primary Care Physician:  Shade Flood, MD  Chief Complaint  Patient presents with   New Patient (Initial Visit)    Pt alone, rm 2. Went to urgent care  09/02/22 for elevated BP. They had him follow up with PCP d/w PCP that Sometimes left hand grip strength may be weaker. Completed CT 09/08/2022.  He has a upcoming apt next week to complete a polypectomy. He needs neuro clearance for the surgery. He is taking 81 mg asa. More recently the BP     HPI:  Raymond Green is a 57 y.o. male and seen here upon referral from Dr. Neva Seat for a Consultation/ Evaluation-[name] is from [place] and was seen here 7 years ago for sleep apnea. He is now  referred for a left hand grip strength loss that let to a head CT and a remote stroke ws identified, now a nasal polypectomy is postponed until he is neurologically cleared.        left hand weakness, he reports onset several months ago-  at the time he had felt dizzy, not nauseated, no headaches.  remote stroke on CT brain-   Sinuses/Orbits: There is extensive opacification of the imaged paranasal sinuses, better assessed on CT sinuses from 07/03/2022. The globes and orbits are unremarkable.  Other: None.  IMPRESSION: 1. No acute intracranial pathology. 2. Remote infarct in the right corona radiata.   Electronically Signed By: Lesia Hausen M.D. On: 09/08/2022 15:51   Review of Systems: Out of a complete 14 system review, the patient complains of only the following symptoms, and all other reviewed systems are negative.    Social History   Socioeconomic History   Marital status: Married    Spouse name: Not on file   Number of children: 0   Years of education: Not on file   Highest education level: Not on file  Occupational History   Occupation: ENDOSCOPY TECHNICIAN    Employer: [employer]  Tobacco Use   Smoking status: Never    Smokeless tobacco: Never  Vaping Use   Vaping Use: Never used  Substance and Sexual Activity   Alcohol use: Yes    Comment: occasional   Drug use: No   Sexual activity: Yes  Other Topics Concern   Not on file  Social History Narrative   Married   Education: Automotive engineer   Exercise: Yes   Social Determinants of Health   Financial Resource Strain: Not on file  Food Insecurity: Food Insecurity Present (09/02/2022)   Hunger Vital Sign    Worried About Running Out of Food in the Last Year: Sometimes true    Ran Out of Food in the Last Year: Sometimes true  Transportation Needs: No Transportation Needs (09/02/2022)   PRAPARE - Administrator, Civil Service (Medical): No    Lack of Transportation (Non-Medical): No  Physical Activity: Not on file  Stress: Not on file  Social Connections: Not on file  Intimate Partner Violence: Not At Risk (09/02/2022)   Humiliation, Afraid, Rape, and Kick questionnaire    Fear of Current or Ex-Partner: No    Emotionally Abused: No    Physically Abused: No    Sexually Abused: No    Family History  Problem Relation Age of Onset   Diabetes Mother    Heart disease Mother    Hypertension Mother    Hypertension Father    Diabetes Sister  Colon cancer Neg Hx    Colon polyps Neg Hx    Esophageal cancer Neg Hx    Rectal cancer Neg Hx    Stomach cancer Neg Hx    Inflammatory bowel disease Neg Hx    Liver disease Neg Hx    Pancreatic cancer Neg Hx     Past Medical History:  Diagnosis Date   Allergy    Anemia    Erosive gastritis 2016   GERD (gastroesophageal reflux disease)    Headache(784.0)    Hemorrhoids    Hyperlipidemia    Hypertension    Kidney insufficiency    LVH (left ventricular hypertrophy)    Dr. Donnie Aho ( cardiology) palpitations   Obesity    OSA (obstructive sleep apnea)    Positive PPD, treated 2000   INH   Recurrent upper respiratory infection (URI)    Sleep apnea    uses cpap   Vitamin D deficiency      Past Surgical History:  Procedure Laterality Date   COLONOSCOPY  12/28/2011   Procedure: COLONOSCOPY;  Surgeon: [name], MD;  Location: [facility];  Service: Endoscopy;  Laterality: N/A;   COLONOSCOPY     HEMORRHOID SURGERY     UPPER GASTROINTESTINAL ENDOSCOPY      Current Outpatient Medications  Medication Sig Dispense Refill   amLODipine (NORVASC) 5 MG tablet Take 1 tablet (5 mg total) by mouth daily. 90 tablet 1   aspirin EC 81 MG tablet Take 81 mg by mouth daily. Swallow whole.     atorvastatin (LIPITOR) 40 MG tablet Take 1 tablet (40 mg total) by mouth daily. 90 tablet 0   losartan (COZAAR) 100 MG tablet Take 1 tablet (100 mg total) by mouth daily. 90 tablet 1   Nebivolol HCl (BYSTOLIC) 20 MG TABS Take 1 tablet (20 mg total) by mouth daily. 90 tablet 1   omeprazole (PRILOSEC) 40 MG capsule Take 1 capsule (40 mg total) by mouth daily. 90 capsule 1   spironolactone (ALDACTONE) 25 MG tablet Take 1 tablet (25 mg total) by mouth daily. 90 tablet 3   No current facility-administered medications for this visit.    Allergies as of 10/25/2022 - Review Complete 10/04/2022  Allergen Reaction Noted   Lisinopril  08/25/2011   Shrimp [shellfish allergy] Nausea And Vomiting 05/02/2016    Vitals: Ht 6' (1.829 m)   Wt 226 lb (102.5 kg)   BMI 30.65 kg/m  Last Weight:  Wt Readings from Last 1 Encounters:  10/25/22 226 lb (102.5 kg)   Last Height:   Ht Readings from Last 1 Encounters:  10/25/22 6' (1.829 m)   Last BMI: @LASTBMI  Physical exam:  General: The patient is awake, alert and appears not in acute distress.  The patient is well groomed. Head: Normocephalic, atraumatic.  Neck is supple.  Neck circumference:17 Cardiovascular:  Regular rate and palpable peripheral pulse:  Respratory: clear to auscultation.  Mallampati2 , Skin:  Neurologic exam : The patient is awake and alert, oriented to place and time.  Memory subjective  described as intact.  There is a  normal attention span & concentration ability.  Speech is fluent without  dysarthria, dysphonia or aphasia.  Mood and affect are appropriate.  Cranial nerves: Pupils are equal and briskly reactive to light. Funduscopic exam without  evidence of pallor or edema. Extraocular movements  in vertical and horizontal planes intact and without nystagmus. Visual fields by finger perimetry are intact. Hearing to finger rub intact.  Facial sensation intact  to fine touch. Facial motor strength is symmetric and tongue and uvula move midline.  Motor exam:   Normal tone and normal muscle bulk and symmetric normal strength in all extremities. Grip Strength is equal !  Proximal strength of shoulder muscles and hip flexors was equal .  Sensory:  Fine touch and vibration were felt less in the tip of all fingers of the left hand,   Coordination: Rapid alternating movements in the fingers/hands were normal.  Finger-to-nose maneuver was tested and showed no evidence of ataxia, dysmetria or tremor.  Gait and station: Patient walked without assistive device . Core Strength within normal limits. Stance is stable and of normal base.  Deep tendon reflexes: in the  upper and lower extremities are symmetric and  Brisk= and  without Clonus. Babinski maneuver response is bilaterally  downgoing.   Assessment: Total time for face to face interview and examination, for review of  images and laboratory testing, neurophysiology testing and pre-existing records, including out-of -network , was 23 minutes. I reviewed the CT scan with the patient in 2 modalities.  Assessment is as follows here:  1)   REMOTE  corona radiata stroke, right brain, no edema, no bleed and  unclear if symptoms even correlated with the  time of this remote stroke. In order to better date the CVA we would need a MRI brain, this can be done after the  nasal polypectomy/ .   2) no ASA,  until his nasal surgical site has healed, hold off on CPAP for the  time , too.  ASA on hold as of today.   3) I will have a RV after 4 weeks post surgery when we discuss further steps as needed.    Plan:  Treatment plan and additional workup planned after today includes:   1) He is cleared for surgery on Monday - hold ASA.! The stroke is remote, at least 30 months old, may be years old.  Resume CPAP and ASA only once your nasal passage is no longer swollen.  2)  we will meet in 4 -6 weeks for follow up and to discuss MRI brain.  HTN control should be optimized with PCP,  please arrange for echo and carotid doppler.   3) Dear Mr Juergensen: I had last seen you in 2017 and wrote for a CPAP machine that you still use, please bring it to follow up visit next months .  You are entitled to a new machine.  I will order a HST in the meantime.  Melvyn Novas, MD

## 2022-10-25 NOTE — Telephone Encounter (Signed)
Pt was scheduled for a opening on 5/8 at 4 pm

## 2022-10-25 NOTE — Patient Instructions (Signed)
Stroke Prevention Some medical conditions and lifestyle choices can lead to a higher risk for a stroke. You can help to prevent a stroke by eating healthy foods and exercising. It also helps to not smoke and to manage any health problems you may have. How can this condition affect me? A stroke is an emergency. It should be treated right away. A stroke can lead to brain damage or threaten your life. There is a better chance of surviving and getting better after a stroke if you get medical help right away. What can increase my risk? The following medical conditions may increase your risk of a stroke: Diseases of the heart and blood vessels (cardiovascular disease). High blood pressure (hypertension). Diabetes. High cholesterol. Sickle cell disease. Problems with blood clotting. Being very overweight. Sleeping problems (obstructivesleep apnea). Other risk factors include: Being older than age 60. A history of blood clots, stroke, or mini-stroke (TIA). Race, ethnic background, or a family history of stroke. Smoking or using tobacco products. Taking birth control pills, especially if you smoke. Heavy alcohol and drug use. Not being active. What actions can I take to prevent this? Manage your health conditions High cholesterol. Eat a healthy diet. If this is not enough to manage your cholesterol, you may need to take medicines. Take medicines as told by your doctor. High blood pressure. Try to keep your blood pressure below 130/80. If your blood pressure cannot be managed through a healthy diet and regular exercise, you may need to take medicines. Take medicines as told by your doctor. Ask your doctor if you should check your blood pressure at home. Have your blood pressure checked every year. Diabetes. Eat a healthy diet and get regular exercise. If your blood sugar (glucose) cannot be managed through diet and exercise, you may need to take medicines. Take medicines as told by your  doctor. Talk to your doctor about getting checked for sleeping problems. Signs of a problem can include: Snoring a lot. Feeling very tired. Make sure that you manage any other conditions you have. Nutrition  Follow instructions from your doctor about what to eat or drink. You may be told to: Eat and drink fewer calories each day. Limit how much salt (sodium) you use to 1,500 milligrams (mg) each day. Use only healthy fats for cooking, such as olive oil, canola oil, and sunflower oil. Eat healthy foods. To do this: Choose foods that are high in fiber. These include whole grains, and fresh fruits and vegetables. Eat at least 5 servings of fruits and vegetables a day. Try to fill one-half of your plate with fruits and vegetables at each meal. Choose low-fat (lean) proteins. These include low-fat cuts of meat, chicken without skin, fish, tofu, beans, and nuts. Eat low-fat dairy products. Avoid foods that: Are high in salt. Have saturated fat. Have trans fat. Have cholesterol. Are processed or pre-made. Count how many carbohydrates you eat and drink each day. Lifestyle If you drink alcohol: Limit how much you have to: 0-1 drink a day for women who are not pregnant. 0-2 drinks a day for men. Know how much alcohol is in your drink. In the [place] one drink equals one 12 oz bottle of beer (355mL), one 5 oz glass of wine (148mL), or one 1 oz glass of hard liquor (44mL). Do not smoke or use any products that have nicotine or tobacco. If you need help quitting, ask your doctor. Avoid secondhand smoke. Do not use drugs. Activity  Try to stay at a   healthy weight. Get at least 30 minutes of exercise on most days, such as: Fast walking. Biking. Swimming. Medicines Take over-the-counter and prescription medicines only as told by your doctor. Avoid taking birth control pills. Talk to your doctor about the risks of taking birth control pills if: You are over 35 years old. You smoke. You get  very bad headaches. You have had a blood clot. Where to find more information American Stroke Association: www.strokeassociation.org Get help right away if: You or a loved one has any signs of a stroke. "BE FAST" is an easy way to remember the warning signs: B - Balance. Dizziness, sudden trouble walking, or loss of balance. E - Eyes. Trouble seeing or a change in how you see. F - Face. Sudden weakness or loss of feeling of the face. The face or eyelid may droop on one side. A - Arms. Weakness or loss of feeling in an arm. This happens all of a sudden and most often on one side of the body. S - Speech. Sudden trouble speaking, slurred speech, or trouble understanding what people say. T - Time. Time to call emergency services. Write down what time symptoms started. You or a loved one has other signs of a stroke, such as: A sudden, very bad headache with no known cause. Feeling like you may vomit (nausea). Vomiting. A seizure. These symptoms may be an emergency. Get help right away. Call your local emergency services (911 in the [place] Do not wait to see if the symptoms will go away. Do not drive yourself to the hospital. Summary You can help to prevent a stroke by eating healthy, exercising, and not smoking. It also helps to manage any health problems you have. Do not smoke or use any products that contain nicotine or tobacco. Get help right away if you or a loved one has any signs of a stroke. This information is not intended to replace advice given to you by your health care provider. Make sure you discuss any questions you have with your health care provider. Document Revised: 12/18/2019 Document Reviewed: 01/05/2020 Elsevier Patient Education  2023 Elsevier Inc.  

## 2022-10-26 ENCOUNTER — Telehealth: Payer: Self-pay | Admitting: Neurology

## 2022-10-26 ENCOUNTER — Encounter (HOSPITAL_COMMUNITY): Payer: Self-pay | Admitting: Otolaryngology

## 2022-10-26 ENCOUNTER — Other Ambulatory Visit: Payer: Self-pay

## 2022-10-26 DIAGNOSIS — I63331 Cerebral infarction due to thrombosis of right posterior cerebral artery: Secondary | ICD-10-CM

## 2022-10-26 NOTE — H&P (Signed)
HPI:  Raymond Green is a 57 y.o. male who presents as a return Patient.  Current problem: Nasal obstruction.  HPI: Long history of nasal obstruction and congestion, found to have nasal polyposis a few couple of years ago. He has had multiple rounds of steroids that usually clears them up temporarily but this most recent round did not. He suffers with hypertension and is on 3 different medications for that. He does not have asthma. He has not been to an allergist. He is originally from [place]. Does not smoke.  PMH/Meds/All/SocHx/FamHx/ROS:  History reviewed. No pertinent past medical history.  History reviewed. No pertinent surgical history.  No family history of bleeding disorders, wound healing problems or difficulty with anesthesia.  Social History  Socioeconomic History  Marital status: Married Spouse name: Not on file  Number of children: Not on file  Years of education: Not on file  Highest education level: Not on file Occupational History  Not on file Tobacco Use  Smoking status: Never  Smokeless tobacco: Never Substance and Sexual Activity  Alcohol use: Not Currently  Drug use: Not on file  Sexual activity: Not on file Other Topics Concern  Not on file Social History Narrative  Not on file  Social Determinants of Health  Food Insecurity: Not on file Transportation Needs: Not on file Living Situation: Not on file  Current Outpatient Medications:  amoxicillin-clavulanate (AUGMENTIN) 875-125 mg per tablet, Take by mouth., Disp: , Rfl:  azelastine-fluticasone (DYMISTA) 137-50 mcg/spray nasal spray, Instill one puff each nostril daily., Disp: 23 g, Rfl: 11   Physical Exam:  Healthy-appearing gentleman no distress. His voice is obviously hyponasal. Oral cavity and pharynx are healthy and clear. Intranasal exam reveals diffuse polyposis left greater than right. Deviated septum towards the right causing additional obstruction on the right. No exudate  identified.  Independent Review of Additional Tests or Records: none  Procedures: none  Impression & Plans: Chronic nasal polyposis. CT from last year revealed diffuse pansinusitis mainly involving the frontals and ethmoids. Recommend another course of prednisone. Recommend allergy evaluation. Recommend repeat CT scan and follow-up in 2 weeks. Recommend consideration for polypectomy.

## 2022-10-26 NOTE — Telephone Encounter (Signed)
[facility] [employer] no auth required sent to [facility] [phone]

## 2022-10-26 NOTE — Addendum Note (Signed)
Addended by: Khilynn Borntreger C on: 10/26/2022 09:11 AM   Modules accepted: Orders  

## 2022-10-26 NOTE — Telephone Encounter (Signed)
The carotid order needs to be put in as VAS US Carotid please

## 2022-10-26 NOTE — Telephone Encounter (Signed)
Order corrected

## 2022-10-26 NOTE — Addendum Note (Signed)
Addended by: Judi Cong on: 10/26/2022 09:11 AM   Modules accepted: Orders

## 2022-10-26 NOTE — Progress Notes (Signed)
Mr. Binner denies chest pain or shortness of breath. Patient denies having any s/s of Covid in his household, also denies any known exposure to Covid. Mr. Gangl denies any s/s of upper or lower respiratory in the past 8 weeks.   Mr. Hockensmith PCP is Dr. [name], patient saw Dr. Rosemary Holms in 2020 for chest pain, cardiac causes were ruled out, patient will see cardiologist on an as need basis, patient has not had chest pain again.

## 2022-10-30 ENCOUNTER — Ambulatory Visit (HOSPITAL_BASED_OUTPATIENT_CLINIC_OR_DEPARTMENT_OTHER): Payer: 59 | Admitting: Anesthesiology

## 2022-10-30 ENCOUNTER — Encounter (HOSPITAL_COMMUNITY): Payer: Self-pay | Admitting: Otolaryngology

## 2022-10-30 ENCOUNTER — Other Ambulatory Visit: Payer: Self-pay

## 2022-10-30 ENCOUNTER — Ambulatory Visit (HOSPITAL_COMMUNITY)
Admission: RE | Admit: 2022-10-30 | Discharge: 2022-10-30 | Disposition: A | Discharge status: Home / Self Care | Payer: 59 | Attending: Otolaryngology | Admitting: Otolaryngology

## 2022-10-30 ENCOUNTER — Other Ambulatory Visit (HOSPITAL_COMMUNITY): Payer: Self-pay

## 2022-10-30 ENCOUNTER — Ambulatory Visit (HOSPITAL_COMMUNITY): Payer: 59 | Admitting: Anesthesiology

## 2022-10-30 ENCOUNTER — Encounter (HOSPITAL_COMMUNITY)
Admission: RE | Disposition: A | Discharge status: Home / Self Care | Payer: Self-pay | Source: Home / Self Care | Attending: Otolaryngology

## 2022-10-30 DIAGNOSIS — J324 Chronic pansinusitis: Secondary | ICD-10-CM | POA: Diagnosis not present

## 2022-10-30 DIAGNOSIS — G4730 Sleep apnea, unspecified: Secondary | ICD-10-CM | POA: Insufficient documentation

## 2022-10-30 DIAGNOSIS — J339 Nasal polyp, unspecified: Secondary | ICD-10-CM

## 2022-10-30 DIAGNOSIS — I10 Essential (primary) hypertension: Secondary | ICD-10-CM

## 2022-10-30 DIAGNOSIS — Z9989 Dependence on other enabling machines and devices: Secondary | ICD-10-CM | POA: Diagnosis not present

## 2022-10-30 DIAGNOSIS — I2510 Atherosclerotic heart disease of native coronary artery without angina pectoris: Secondary | ICD-10-CM

## 2022-10-30 DIAGNOSIS — G4733 Obstructive sleep apnea (adult) (pediatric): Secondary | ICD-10-CM | POA: Diagnosis not present

## 2022-10-30 DIAGNOSIS — J310 Chronic rhinitis: Secondary | ICD-10-CM

## 2022-10-30 DIAGNOSIS — K219 Gastro-esophageal reflux disease without esophagitis: Secondary | ICD-10-CM | POA: Insufficient documentation

## 2022-10-30 DIAGNOSIS — E669 Obesity, unspecified: Secondary | ICD-10-CM | POA: Diagnosis not present

## 2022-10-30 DIAGNOSIS — Z6830 Body mass index (BMI) 30.0-30.9, adult: Secondary | ICD-10-CM | POA: Insufficient documentation

## 2022-10-30 DIAGNOSIS — J329 Chronic sinusitis, unspecified: Secondary | ICD-10-CM | POA: Diagnosis not present

## 2022-10-30 HISTORY — DX: Cerebral infarction, unspecified: I63.9

## 2022-10-30 HISTORY — DX: Cardiac arrhythmia, unspecified: I49.9

## 2022-10-30 HISTORY — PX: NASAL SINUS SURGERY: SHX719

## 2022-10-30 LAB — POCT I-STAT, CHEM 8
BUN: 21 mg/dL — ABNORMAL HIGH (ref 6–20)
Calcium, Ion: 1.38 mmol/L (ref 1.15–1.40)
Chloride: 103 mmol/L (ref 98–111)
Creatinine, Ser: 1.3 mg/dL — ABNORMAL HIGH (ref 0.61–1.24)
Glucose, Bld: 81 mg/dL (ref 70–99)
HCT: 30 % — ABNORMAL LOW (ref 39.0–52.0)
Hemoglobin: 10.2 g/dL — ABNORMAL LOW (ref 13.0–17.0)
Potassium: 4.6 mmol/L (ref 3.5–5.1)
Sodium: 140 mmol/L (ref 135–145)
TCO2: 24 mmol/L (ref 22–32)

## 2022-10-30 SURGERY — SINUS SURGERY, ENDOSCOPIC
Anesthesia: General | Site: Nose | Laterality: Bilateral

## 2022-10-30 MED ORDER — LACTATED RINGERS IV SOLN: INTRAVENOUS | Status: DC

## 2022-10-30 MED ORDER — PHENYLEPHRINE HCL-NACL 20-0.9 MG/250ML-% IV SOLN
INTRAVENOUS | Status: DC | PRN
  Administered 2022-10-30: 60 ug/min via INTRAVENOUS

## 2022-10-30 MED ORDER — ONDANSETRON HCL 4 MG/2ML IJ SOLN
INTRAMUSCULAR | Status: AC
  Filled 2022-10-30: qty 2

## 2022-10-30 MED ORDER — GLYCOPYRROLATE PF 0.2 MG/ML IJ SOSY
PREFILLED_SYRINGE | INTRAMUSCULAR | Status: AC
  Filled 2022-10-30: qty 1

## 2022-10-30 MED ORDER — FENTANYL CITRATE (PF) 250 MCG/5ML IJ SOLN
INTRAMUSCULAR | Status: AC
  Filled 2022-10-30: qty 5

## 2022-10-30 MED ORDER — LIDOCAINE 2% (20 MG/ML) 5 ML SYRINGE
INTRAMUSCULAR | Status: AC
  Filled 2022-10-30: qty 5

## 2022-10-30 MED ORDER — OXYMETAZOLINE HCL 0.05 % NA SOLN
NASAL | Status: DC | PRN
  Administered 2022-10-30: 1 via TOPICAL

## 2022-10-30 MED ORDER — CHLORHEXIDINE GLUCONATE 0.12 % MT SOLN: 15.0000 mL | Freq: Once | OROMUCOSAL | Status: AC

## 2022-10-30 MED ORDER — LIDOCAINE-EPINEPHRINE 1 %-1:100000 IJ SOLN
INTRAMUSCULAR | Status: AC
  Filled 2022-10-30: qty 1

## 2022-10-30 MED ORDER — OXYMETAZOLINE HCL 0.05 % NA SOLN
2.0000 | NASAL | Status: DC
  Administered 2022-10-30: 2 via NASAL
  Filled 2022-10-30: qty 30

## 2022-10-30 MED ORDER — CHLORHEXIDINE GLUCONATE 0.12 % MT SOLN
OROMUCOSAL | Status: AC
  Administered 2022-10-30: 15 mL via OROMUCOSAL
  Filled 2022-10-30: qty 15

## 2022-10-30 MED ORDER — ONDANSETRON HCL 4 MG/2ML IJ SOLN
INTRAMUSCULAR | Status: DC | PRN
  Administered 2022-10-30: 4 mg via INTRAVENOUS

## 2022-10-30 MED ORDER — ORAL CARE MOUTH RINSE: 15.0000 mL | Freq: Once | OROMUCOSAL | Status: AC

## 2022-10-30 MED ORDER — FENTANYL CITRATE (PF) 250 MCG/5ML IJ SOLN
INTRAMUSCULAR | Status: DC | PRN
  Administered 2022-10-30: 100 ug via INTRAVENOUS
  Administered 2022-10-30 (×2): 50 ug via INTRAVENOUS

## 2022-10-30 MED ORDER — OXYCODONE HCL 5 MG PO TABS
5.0000 mg | ORAL_TABLET | Freq: Once | ORAL | Status: AC
  Administered 2022-10-30: 5 mg via ORAL

## 2022-10-30 MED ORDER — DEXAMETHASONE SODIUM PHOSPHATE 10 MG/ML IJ SOLN
INTRAMUSCULAR | Status: DC | PRN
  Administered 2022-10-30: 10 mg via INTRAVENOUS

## 2022-10-30 MED ORDER — LIDOCAINE-EPINEPHRINE 1 %-1:100000 IJ SOLN
INTRAMUSCULAR | Status: DC | PRN
  Administered 2022-10-30: 8 mL

## 2022-10-30 MED ORDER — PROMETHAZINE HCL 25 MG/ML IJ SOLN: 6.2500 mg | INTRAMUSCULAR | Status: DC | PRN

## 2022-10-30 MED ORDER — ROCURONIUM BROMIDE 10 MG/ML (PF) SYRINGE
PREFILLED_SYRINGE | INTRAVENOUS | Status: DC | PRN
  Administered 2022-10-30: 20 mg via INTRAVENOUS
  Administered 2022-10-30: 60 mg via INTRAVENOUS

## 2022-10-30 MED ORDER — BACITRACIN ZINC 500 UNIT/GM EX OINT
TOPICAL_OINTMENT | CUTANEOUS | Status: AC
  Filled 2022-10-30: qty 28.35

## 2022-10-30 MED ORDER — FENTANYL CITRATE (PF) 100 MCG/2ML IJ SOLN
INTRAMUSCULAR | Status: AC
  Filled 2022-10-30: qty 2

## 2022-10-30 MED ORDER — ACETAMINOPHEN 500 MG PO TABS
1000.0000 mg | ORAL_TABLET | Freq: Once | ORAL | Status: AC
  Administered 2022-10-30: 1000 mg via ORAL
  Filled 2022-10-30: qty 2

## 2022-10-30 MED ORDER — OXYMETAZOLINE HCL 0.05 % NA SOLN
NASAL | Status: AC
  Filled 2022-10-30: qty 30

## 2022-10-30 MED ORDER — MIDAZOLAM HCL 2 MG/2ML IJ SOLN
INTRAMUSCULAR | Status: DC | PRN
  Administered 2022-10-30: 1 mg via INTRAVENOUS

## 2022-10-30 MED ORDER — 0.9 % SODIUM CHLORIDE (POUR BTL) OPTIME
TOPICAL | Status: DC | PRN
  Administered 2022-10-30: 1000 mL

## 2022-10-30 MED ORDER — SUGAMMADEX SODIUM 200 MG/2ML IV SOLN
INTRAVENOUS | Status: DC | PRN
  Administered 2022-10-30: 200 mg via INTRAVENOUS

## 2022-10-30 MED ORDER — HYDROCODONE-ACETAMINOPHEN 7.5-325 MG PO TABS
1.0000 | ORAL_TABLET | Freq: Four times a day (QID) | ORAL | 0 refills | Status: DC | PRN
  Filled 2022-10-30 (×2): qty 20, 5d supply, fill #0

## 2022-10-30 MED ORDER — ROCURONIUM BROMIDE 10 MG/ML (PF) SYRINGE
PREFILLED_SYRINGE | INTRAVENOUS | Status: AC
  Filled 2022-10-30: qty 10

## 2022-10-30 MED ORDER — MIDAZOLAM HCL 2 MG/2ML IJ SOLN
INTRAMUSCULAR | Status: AC
  Filled 2022-10-30: qty 2

## 2022-10-30 MED ORDER — PROPOFOL 10 MG/ML IV BOLUS
INTRAVENOUS | Status: DC | PRN
  Administered 2022-10-30: 170 mg via INTRAVENOUS

## 2022-10-30 MED ORDER — VASOPRESSIN 20 UNIT/ML IV SOLN
INTRAVENOUS | Status: AC
  Filled 2022-10-30: qty 1

## 2022-10-30 MED ORDER — OXYCODONE HCL 5 MG PO TABS
ORAL_TABLET | ORAL | Status: AC
  Filled 2022-10-30: qty 1

## 2022-10-30 MED ORDER — EPHEDRINE SULFATE-NACL 50-0.9 MG/10ML-% IV SOSY
PREFILLED_SYRINGE | INTRAVENOUS | Status: DC | PRN
  Administered 2022-10-30: 10 mg via INTRAVENOUS

## 2022-10-30 MED ORDER — GLYCOPYRROLATE PF 0.2 MG/ML IJ SOSY
PREFILLED_SYRINGE | INTRAMUSCULAR | Status: DC | PRN
  Administered 2022-10-30: .2 mg via INTRAVENOUS

## 2022-10-30 MED ORDER — ARTIFICIAL TEARS OPHTHALMIC OINT
TOPICAL_OINTMENT | OPHTHALMIC | Status: AC
  Filled 2022-10-30: qty 3.5

## 2022-10-30 MED ORDER — ONDANSETRON 4 MG PO TBDP
4.0000 mg | ORAL_TABLET | Freq: Three times a day (TID) | ORAL | 0 refills | Status: DC | PRN
  Filled 2022-10-30 (×2): qty 20, 7d supply, fill #0

## 2022-10-30 MED ORDER — DEXAMETHASONE SODIUM PHOSPHATE 10 MG/ML IJ SOLN
INTRAMUSCULAR | Status: AC
  Filled 2022-10-30: qty 1

## 2022-10-30 MED ORDER — SODIUM CHLORIDE 0.9 % IR SOLN
Status: DC | PRN
  Administered 2022-10-30: 1000 mL

## 2022-10-30 MED ORDER — EPHEDRINE 5 MG/ML INJ
INTRAVENOUS | Status: AC
  Filled 2022-10-30: qty 5

## 2022-10-30 MED ORDER — PHENYLEPHRINE 80 MCG/ML (10ML) SYRINGE FOR IV PUSH (FOR BLOOD PRESSURE SUPPORT)
PREFILLED_SYRINGE | INTRAVENOUS | Status: AC
  Filled 2022-10-30: qty 10

## 2022-10-30 MED ORDER — FENTANYL CITRATE (PF) 100 MCG/2ML IJ SOLN
25.0000 ug | INTRAMUSCULAR | Status: DC | PRN
  Administered 2022-10-30: 50 ug via INTRAVENOUS

## 2022-10-30 SURGICAL SUPPLY — 25 items
BAG COUNTER SPONGE SURGICOUNT (BAG) ×1 IMPLANT
BAG SPNG CNTER NS LX DISP (BAG) ×1
BLADE TRICUT ROTATE M4 4 5PK (BLADE) ×1 IMPLANT
CANISTER SUCT 3000ML PPV (MISCELLANEOUS) ×1 IMPLANT
DRAPE HALF SHEET 40X57 (DRAPES) IMPLANT
DRSG NASOPORE 8CM (GAUZE/BANDAGES/DRESSINGS) IMPLANT
ELECT REM PT RETURN 9FT ADLT (ELECTROSURGICAL) ×1
ELECTRODE REM PT RTRN 9FT ADLT (ELECTROSURGICAL) IMPLANT
FILTER ARTHROSCOPY CONVERTOR (FILTER) IMPLANT
GLOVE ECLIPSE 7.5 STRL STRAW (GLOVE) ×1 IMPLANT
GOWN STRL REUS W/ TWL LRG LVL3 (GOWN DISPOSABLE) ×2 IMPLANT
GOWN STRL REUS W/TWL LRG LVL3 (GOWN DISPOSABLE) ×2
KIT BASIN OR (CUSTOM PROCEDURE TRAY) ×1 IMPLANT
KIT TURNOVER KIT B (KITS) ×1 IMPLANT
NDL PRECISIONGLIDE 27X1.5 (NEEDLE) ×1 IMPLANT
NEEDLE PRECISIONGLIDE 27X1.5 (NEEDLE) ×1 IMPLANT
NS IRRIG 1000ML POUR BTL (IV SOLUTION) ×1 IMPLANT
PAD ARMBOARD 7.5X6 YLW CONV (MISCELLANEOUS) ×2 IMPLANT
PATTIES SURGICAL .5 X3 (DISPOSABLE) ×1 IMPLANT
SPECIMEN JAR SMALL (MISCELLANEOUS) ×1 IMPLANT
TOWEL GREEN STERILE FF (TOWEL DISPOSABLE) ×1 IMPLANT
TRAY ENT MC OR (CUSTOM PROCEDURE TRAY) ×1 IMPLANT
TUBE CONNECTING 12X1/4 (SUCTIONS) ×1 IMPLANT
TUBING EXTENTION W/L.L. (IV SETS) IMPLANT
WATER STERILE IRR 1000ML POUR (IV SOLUTION) ×1 IMPLANT

## 2022-10-30 NOTE — Anesthesia Preprocedure Evaluation (Addendum)
Anesthesia Evaluation  Patient identified by MRN, date of birth, ID band Patient awake  General Assessment Comment:Chronic rhinitis; Nasal polyps  Reviewed: Allergy & Precautions, NPO status , Patient's Chart, lab work & pertinent test results, reviewed documented beta blocker date and time   Airway Mallampati: III  TM Distance: >3 FB Neck ROM: Full    Dental  (+) Teeth Intact, Dental Advisory Given   Pulmonary sleep apnea and Continuous Positive Airway Pressure Ventilation    Pulmonary exam normal breath sounds clear to auscultation       Cardiovascular hypertension, Pt. on home beta blockers and Pt. on medications (-) angina + CAD  (-) Past MI Normal cardiovascular exam Rhythm:Regular Rate:Normal     Neuro/Psych  Headaches CVA    GI/Hepatic Neg liver ROS, PUD,GERD  Medicated and Controlled,,  Endo/Other  Obesity   Renal/GU Renal InsufficiencyRenal disease     Musculoskeletal negative musculoskeletal ROS (+)    Abdominal   Peds  Hematology negative hematology ROS (+)   Anesthesia Other Findings Day of surgery medications reviewed with the patient.  Reproductive/Obstetrics                             Anesthesia Physical Anesthesia Plan  ASA: 3  Anesthesia Plan: General   Post-op Pain Management: Tylenol PO (pre-op)*   Induction: Intravenous  PONV Risk Score and Plan: 3 and Midazolam, Dexamethasone and Ondansetron  Airway Management Planned: Oral ETT and Video Laryngoscope Planned  Additional Equipment:   Intra-op Plan:   Post-operative Plan: Extubation in OR  Informed Consent: I have reviewed the patients History and Physical, chart, labs and discussed the procedure including the risks, benefits and alternatives for the proposed anesthesia with the patient or authorized representative who has indicated his/her understanding and acceptance.     Dental advisory given  Plan  Discussed with: CRNA  Anesthesia Plan Comments:        Anesthesia Quick Evaluation

## 2022-10-30 NOTE — Transfer of Care (Signed)
Immediate Anesthesia Transfer of Care Note  Patient: Raymond Green  Procedure(s) Performed: ENDOSCOPIC NASAL POLYPECTOMY; FRONTAL ETHMOIDECTOMY AND MAXILLARY SINUS SURGERY (Bilateral: Nose)  Patient Location: PACU  Anesthesia Type:General  Level of Consciousness: drowsy, patient cooperative, and responds to stimulation  Airway & Oxygen Therapy: Patient Spontanous Breathing and Patient connected to face mask oxygen  Post-op Assessment: Report given to RN and Post -op Vital signs reviewed and stable  Post vital signs: Reviewed and stable  Attending surgeon aware of nose bleeding.  Last Vitals:  Vitals Value Taken Time  BP 149/94 10/30/22 1307  Temp    Pulse 63 10/30/22 1309  Resp 12 10/30/22 1309  SpO2 100 % 10/30/22 1309  Vitals shown include unvalidated device data.  Last Pain:  Vitals:   10/30/22 0902  TempSrc:   PainSc: 0-No pain         Complications: No notable events documented.

## 2022-10-30 NOTE — Interval H&P Note (Signed)
History and Physical Interval Note:  10/30/2022 10:18 AM  Raymond Green  has presented today for surgery, with the diagnosis of Chronic rhinitis; Nasal polyps.  The various methods of treatment have been discussed with the patient and family. After consideration of risks, benefits and other options for treatment, the patient has consented to  Procedure(s): ENDOSCOPIC NASAL POLYPECTOMY; FRONTAL ETHMOIDECTOMY AND MAXILLARY SINUS SURGERY (Bilateral) as a surgical intervention.  The patient's history has been reviewed, patient examined, no change in status, stable for surgery.  I have reviewed the patient's chart and labs.  Questions were answered to the patient's satisfaction.     [name]

## 2022-10-30 NOTE — Op Note (Signed)
OPERATIVE REPORT  DATE OF SURGERY: 10/30/2022  PATIENT:  Raymond Green,  57 y.o. male  PRE-OPERATIVE DIAGNOSIS:  Chronic sinusitis; Nasal polyps  POST-OPERATIVE DIAGNOSIS:  Chronic sinusitis; Nasal polyps  PROCEDURE:  Procedure(s): ENDOSCOPIC NASAL POLYPECTOMY, bilateral; bilateral endoscopic ETHMOIDECTOMY AND MAXILLARY SINUS SURGERY  SURGEON:  Susy Frizzle, MD  ASSISTANTS: None.  ANESTHESIA:   General   EBL: 150 ml  DRAINS: None  LOCAL MEDICATIONS USED: 1% Xylocaine with epinephrine  SPECIMEN: Bilateral nasal and sinus contents  COUNTS:  Correct  PROCEDURE DETAILS: The patient was taken to the operating room and placed on the operating table in the supine position. Following induction of general endotracheal anesthesia, the face was prepped and draped in standard fashion.  Afrin spray was used preoperatively in the nasal cavities.  A 0 degree endoscope was used to inspect the nasal cavities and large polypoid masses were identified bilaterally.  Local anesthetic was infiltrated into the polyps and then after the initial polyps were cleared out the posterior and superior attachments of the middle turbinates were infiltrated.  1.  Bilateral endoscopic nasal polypectomy.  Using the 0 degree nasal endoscope and the microdebrider and extensive polypectomy was performed.  There is extensive polypoid disease present in the inferior and middle meatus bilaterally and the superior meatus as well.  All polypoid disease was removed.  Some larger polyps were removed with while Blakesley forceps.  All was sent for pathologic evaluation.  2.  Bilateral endoscopic total ethmoidectomy.  After the initial polypectomy was completed the ethmoid bulla was inspected bilaterally and cleared out using the microdebrider.  0 and 30 degree endoscopes were used.  A complete ethmoid dissection was accomplished bilaterally using the microdebrider and angled forceps.  There is extensive polypoid disease  filling all sinus cavities.  Complete ethmoid exoneration was performed bilaterally using the lamina papyracea as the lateral limit of dissection in the fovea as the superior limit.  Ground lamella was taken down but the middle turbinates were preserved.  There was some bony septations that were thickened and osteitic.  There is no purulence obtained.  Giraffe forceps were used to clear out polypoid disease from the frontal recesses.  I was unable to enter the left frontal sinus with anything larger than a sinus seeker.  The bone was too thick and osteitic.  3.  Bilateral endoscopic maxillary antrostomy.  After the initial bulla exploration was completed on both sides a curved suction was entered into the maxillary antrum to confirm position and then a backbiting forcep was used to enlarge the antrostomy anteriorly and a straight through cut to enlarge it posteriorly.  Polypoid disease was not present within the maxillary sinuses.  The ethmoid cavities were packed with half of a nasal pore dressing on each side.  The pharynx was suctioned of blood and secretions under direct visualization.  Patient was awakened extubated and transferred to recovery in stable condition.    PATIENT DISPOSITION:  To PACU, stable

## 2022-10-30 NOTE — Discharge Instructions (Signed)
Use nasal saline spray every hour while awake and both sides.  Avoid any strenuous activity.

## 2022-10-30 NOTE — Progress Notes (Signed)
 of fentanyl wasted in the stericycle with Atha Starks RN.   Versie Starks, RN

## 2022-10-30 NOTE — Anesthesia Procedure Notes (Signed)
Procedure Name: Intubation Date/Time: 10/30/2022 11:44 AM  Performed by: Ayesha Rumpf, CRNAPre-anesthesia Checklist: Patient identified, Emergency Drugs available, Suction available and Patient being monitored Patient Re-evaluated:Patient Re-evaluated prior to induction Oxygen Delivery Method: Circle System Utilized Preoxygenation: Pre-oxygenation with 100% oxygen Induction Type: IV induction Ventilation: Mask ventilation without difficulty Laryngoscope Size: Glidescope and 4 Grade View: Grade I Tube type: Oral Tube size: 7.5 mm Number of attempts: 1 Airway Equipment and Method: Stylet and Oral airway Placement Confirmation: ETT inserted through vocal cords under direct vision, positive ETCO2 and breath sounds checked- equal and bilateral Secured at: 24 cm Tube secured with: Tape Dental Injury: Teeth and Oropharynx as per pre-operative assessment

## 2022-10-31 ENCOUNTER — Encounter (HOSPITAL_COMMUNITY): Payer: Self-pay | Admitting: Otolaryngology

## 2022-10-31 NOTE — Anesthesia Postprocedure Evaluation (Signed)
Anesthesia Post Note  Patient: Raymond Green  Procedure(s) Performed: ENDOSCOPIC NASAL POLYPECTOMY; FRONTAL ETHMOIDECTOMY AND MAXILLARY SINUS SURGERY (Bilateral: Nose)     Patient location during evaluation: PACU Anesthesia Type: General Level of consciousness: awake and alert Pain management: pain level controlled Vital Signs Assessment: post-procedure vital signs reviewed and stable Respiratory status: spontaneous breathing, nonlabored ventilation, respiratory function stable and patient connected to nasal cannula oxygen Cardiovascular status: blood pressure returned to baseline and stable Postop Assessment: no apparent nausea or vomiting Anesthetic complications: no   No notable events documented.  Last Vitals:  Vitals:   10/30/22 1345 10/30/22 1400  BP: (!) 146/90 (!) 154/88  Pulse: (!) 44 (!) 52  Resp: 14 12  Temp:  36.5 C  SpO2: 98% 99%    Last Pain:  Vitals:   10/30/22 1330  TempSrc:   PainSc: Asleep                 Collene Schlichter

## 2022-11-01 ENCOUNTER — Telehealth: Payer: Self-pay | Admitting: Neurology

## 2022-11-01 ENCOUNTER — Other Ambulatory Visit (HOSPITAL_BASED_OUTPATIENT_CLINIC_OR_DEPARTMENT_OTHER): Payer: Self-pay

## 2022-11-01 LAB — SURGICAL PATHOLOGY

## 2022-11-01 NOTE — Telephone Encounter (Signed)
MAIL[facility]- [facility] [employer] no auth req   Patient is on the schedule for 11/07/22.

## 2022-11-07 ENCOUNTER — Ambulatory Visit: Payer: 59 | Admitting: Internal Medicine

## 2022-11-07 ENCOUNTER — Other Ambulatory Visit (HOSPITAL_COMMUNITY): Payer: Self-pay

## 2022-11-07 ENCOUNTER — Other Ambulatory Visit: Payer: Self-pay | Admitting: Family Medicine

## 2022-11-07 ENCOUNTER — Ambulatory Visit: Payer: 59 | Admitting: Neurology

## 2022-11-07 DIAGNOSIS — D508 Other iron deficiency anemias: Secondary | ICD-10-CM

## 2022-11-07 DIAGNOSIS — I10 Essential (primary) hypertension: Secondary | ICD-10-CM

## 2022-11-07 DIAGNOSIS — J329 Chronic sinusitis, unspecified: Secondary | ICD-10-CM

## 2022-11-07 DIAGNOSIS — Z8673 Personal history of transient ischemic attack (TIA), and cerebral infarction without residual deficits: Secondary | ICD-10-CM

## 2022-11-07 DIAGNOSIS — G4733 Obstructive sleep apnea (adult) (pediatric): Secondary | ICD-10-CM | POA: Diagnosis not present

## 2022-11-07 MED ORDER — ATORVASTATIN CALCIUM 40 MG PO TABS
40.0000 mg | ORAL_TABLET | Freq: Every day | ORAL | 0 refills | Status: DC
  Filled 2022-11-07: qty 90, 90d supply, fill #0

## 2022-11-14 ENCOUNTER — Ambulatory Visit (HOSPITAL_COMMUNITY)
Admission: RE | Admit: 2022-11-14 | Discharge: 2022-11-14 | Disposition: A | Discharge status: Home / Self Care | Payer: 59 | Source: Ambulatory Visit | Attending: Neurology | Admitting: Neurology

## 2022-11-14 DIAGNOSIS — Z8673 Personal history of transient ischemic attack (TIA), and cerebral infarction without residual deficits: Secondary | ICD-10-CM | POA: Insufficient documentation

## 2022-11-14 DIAGNOSIS — I10 Essential (primary) hypertension: Secondary | ICD-10-CM | POA: Diagnosis not present

## 2022-11-14 DIAGNOSIS — J329 Chronic sinusitis, unspecified: Secondary | ICD-10-CM | POA: Insufficient documentation

## 2022-11-14 DIAGNOSIS — J339 Nasal polyp, unspecified: Secondary | ICD-10-CM | POA: Insufficient documentation

## 2022-11-14 DIAGNOSIS — D508 Other iron deficiency anemias: Secondary | ICD-10-CM | POA: Diagnosis not present

## 2022-11-14 DIAGNOSIS — I639 Cerebral infarction, unspecified: Secondary | ICD-10-CM | POA: Diagnosis not present

## 2022-11-14 MED ORDER — GADOBUTROL 1 MMOL/ML IV SOLN
10.0000 mL | Freq: Once | INTRAVENOUS | Status: AC | PRN
  Administered 2022-11-14: 10 mL via INTRAVENOUS

## 2022-11-14 NOTE — Progress Notes (Signed)
[facility]  [name] "Chrissie Noa" 57 year old male [birth year]   HOME SLEEP TEST REPORT ( by Watch PAT)   STUDY DATE:  11-13-2022    ORDERING CLINICIAN: Melvyn Novas, MD  REFERRING CLINICIAN: Dr Neva Seat, MD    CLINICAL INFORMATION/HISTORY: [id number]; patent referred as a STROKE patient, needing OR clearance. He presented to the Ed with left hand weakness , this symptom had been present on and off for several month. Remote stroke was detected on CT head from 09-08-2022, localized to  the Right corona radiata. The patient went to urgent care in March with an elevated blood pressure from there he was referred back to his primary care physician and then reported that he had sometimes left hand grip strength problems a CT of the head on 3-22 confirmed the presence of a remote stroke.  Due to this finding he was unable to undergo polypectomy with ENT.  He is awaiting surgical clearance from [place].  He reports chronic rhinosinusitis, primary hypertension and has had sleep apnea on CPAP     Epworth sleepiness score: XYZ/24.   BMI: 30.5  kg/m   Neck Circumference: 17"   FINDINGS:   7 hours 56 minutes of Total Recording Time (hours, min):        Total Sleep Time (hours, min):    7 hours 17 minutes             Percent REM (%): 27.2%                                      Respiratory Indices:   Calculated pAHI (per hour): 39.9/h, severe sleep apnea                           REM pAHI:    59/h                                             NREM pAHI:    33.1/h                          Positional AHI: The patient slept mostly in supine associated with an AHI of 51.8/h this was followed by left lateral sleep with an AHI of 28.6/h and right lateral sleep with an AHI of 17.6/h.  There is a significant reduction by about 50% in AHI when sleeping nonsupine.  Snoring level reached a mean volume of 42 dB and was present for over 40% of total sleep time.                                                  Oxygen Saturation Statistics:        O2 Saturation Range (%):    Between 83 at nadir and a maximum saturation of 100% with a mean saturation of 95%                                   O2 Saturation (minutes) <89%:   2.3  minutes        Pulse Rate Statistics:   Pulse Mean (bpm): 54 bpm                Pulse Range:   Between 40 and 115 bpm              IMPRESSION:  This HST confirms the ongoing presence of severe obstructive sleep apnea.  There is a clear REM sleep preference and supine sleep accentuation for the patient's sleep apnea but even non-REM sleep and nonsupine sleep are still associated with severe apnea.  The patient needs to continue positive airway pressure therapy.  His in-lab sleep study from Nov 11, 2015 had shown also severe sleep apnea with the same distribution between REM and non-REM sleep, The patient had at the time being titrated to 8 cm water pressureThe patient had at the time being titrated to 8 cm water pressure.   RECOMMENDATION: renewal of CPAP machine replacement with autotitration device, set between 5-15 cm water, 2 cm EPR and humidifier and interface of patient's choice.   RV between day 30 and day 80 of new CPAP use with Np     INTERPRETING PHYSICIAN:   Melvyn Novas, MD  [facility].

## 2022-11-17 ENCOUNTER — Ambulatory Visit (HOSPITAL_COMMUNITY)
Admission: RE | Admit: 2022-11-17 | Discharge: 2022-11-17 | Disposition: A | Discharge status: Home / Self Care | Payer: 59 | Source: Ambulatory Visit | Attending: Neurology | Admitting: Neurology

## 2022-11-17 ENCOUNTER — Ambulatory Visit (HOSPITAL_BASED_OUTPATIENT_CLINIC_OR_DEPARTMENT_OTHER)
Admission: RE | Admit: 2022-11-17 | Discharge: 2022-11-17 | Disposition: A | Discharge status: Home / Self Care | Payer: 59 | Source: Ambulatory Visit | Attending: Neurology | Admitting: Neurology

## 2022-11-17 DIAGNOSIS — I63331 Cerebral infarction due to thrombosis of right posterior cerebral artery: Secondary | ICD-10-CM | POA: Diagnosis not present

## 2022-11-17 DIAGNOSIS — R079 Chest pain, unspecified: Secondary | ICD-10-CM | POA: Insufficient documentation

## 2022-11-17 DIAGNOSIS — I10 Essential (primary) hypertension: Secondary | ICD-10-CM | POA: Insufficient documentation

## 2022-11-17 DIAGNOSIS — G4733 Obstructive sleep apnea (adult) (pediatric): Secondary | ICD-10-CM

## 2022-11-17 DIAGNOSIS — I2510 Atherosclerotic heart disease of native coronary artery without angina pectoris: Secondary | ICD-10-CM | POA: Insufficient documentation

## 2022-11-17 DIAGNOSIS — D508 Other iron deficiency anemias: Secondary | ICD-10-CM

## 2022-11-17 DIAGNOSIS — I639 Cerebral infarction, unspecified: Secondary | ICD-10-CM | POA: Insufficient documentation

## 2022-11-17 DIAGNOSIS — Z8673 Personal history of transient ischemic attack (TIA), and cerebral infarction without residual deficits: Secondary | ICD-10-CM

## 2022-11-17 DIAGNOSIS — J329 Chronic sinusitis, unspecified: Secondary | ICD-10-CM

## 2022-11-17 LAB — ECHOCARDIOGRAM COMPLETE BUBBLE STUDY
AR max vel: 2.76 cm2
AV Area VTI: 2.85 cm2
AV Area mean vel: 2.62 cm2
AV Mean grad: 4 mmHg
AV Peak grad: 7.7 mmHg
Ao pk vel: 1.39 m/s
Area-P 1/2: 3.48 cm2
S' Lateral: 3.9 cm

## 2022-11-21 NOTE — Progress Notes (Signed)
Please call and inform patient that the recent carotid US did not show evidence of high grade stenosis or any other significant stenosis.

## 2022-11-21 NOTE — Progress Notes (Signed)
Please call and inform patient that the echocardiogram showed normal biventricular function without evidence of hemodynamically significant valvular heart disease with an EF 60-65%. No intracardiac source of embolism detected on this transthoracic study

## 2022-11-28 ENCOUNTER — Telehealth: Payer: Self-pay

## 2022-11-28 NOTE — Telephone Encounter (Signed)
I called the patient and informed him of the results. He verbalized understanding of the findings and expressed appreciation for the call. All questions answered.

## 2022-11-28 NOTE — Telephone Encounter (Signed)
-----   Message from Melvyn Novas, MD sent at 11/27/2022  8:44 AM EDT ----- There was a chronic infarct seen in the right corona radiata, a lacune ( most common risk factor for lacunar strokes is HTN). This finding can explain the presence of left sided weakness, but it  would only be fitting for the time frame of symptoms occurring over 3-6 months ago.  Further stroke follow up under the guidance of Dr Pearlean Brownie.   Also sinusitis is present. Please have PCP treat that condition.   I copied this message , it is accessible to the patient.

## 2022-11-29 ENCOUNTER — Telehealth: Payer: Self-pay | Admitting: Family Medicine

## 2022-11-29 DIAGNOSIS — Z8673 Personal history of transient ischemic attack (TIA), and cerebral infarction without residual deficits: Secondary | ICD-10-CM | POA: Insufficient documentation

## 2022-11-29 DIAGNOSIS — J329 Chronic sinusitis, unspecified: Secondary | ICD-10-CM | POA: Insufficient documentation

## 2022-11-29 DIAGNOSIS — J339 Nasal polyp, unspecified: Secondary | ICD-10-CM | POA: Insufficient documentation

## 2022-11-29 NOTE — Telephone Encounter (Signed)
Patient with recent MRI, mucosal edema noted in sinuses with concern for active generalized sinusitis.  Had undergone recent procedure with ENT, this could be in response to that procedure.  Please call patient.  Is he having any new sinus symptoms or acute sinus symptoms?

## 2022-11-29 NOTE — Procedures (Signed)
  [facility]  Raymond A Henrichs "William" 56 year old male 02/03/1966   HOME SLEEP TEST REPORT ( by Watch PAT)   STUDY DATE:  11-13-2022    ORDERING CLINICIAN: Dayson Aboud, MD  REFERRING CLINICIAN: Dr Greene, MD    CLINICAL INFORMATION/HISTORY: [id number]; patent referred as a STROKE patient, needing OR clearance. He presented to the Ed with left hand weakness , this symptom had been present on and off for several month. Remote stroke was detected on CT head from 09-08-2022, localized to  the Right corona radiata. The patient went to urgent care in March with an elevated blood pressure from there he was referred back to his primary care physician and then reported that he had sometimes left hand grip strength problems a CT of the head on 3-22 confirmed the presence of a remote stroke.  Due to this finding he was unable to undergo polypectomy with ENT.  He is awaiting surgical clearance from us.  He reports chronic rhinosinusitis, primary hypertension and has had sleep apnea on CPAP     Epworth sleepiness score: XYZ/24.   BMI: 30.5  kg/m   Neck Circumference: 17"   FINDINGS:   7 hours 56 minutes of Total Recording Time (hours, min):        Total Sleep Time (hours, min):    7 hours 17 minutes             Percent REM (%): 27.2%                                      Respiratory Indices:   Calculated pAHI (per hour): 39.9/h, severe sleep apnea                           REM pAHI:    59/h                                             NREM pAHI:    33.1/h                          Positional AHI: The patient slept mostly in supine associated with an AHI of 51.8/h this was followed by left lateral sleep with an AHI of 28.6/h and right lateral sleep with an AHI of 17.6/h.  There is a significant reduction by about 50% in AHI when sleeping nonsupine.  Snoring level reached a mean volume of 42 dB and was present for over 40% of total sleep time.                                                  Oxygen Saturation Statistics:        O2 Saturation Range (%):    Between 83 at nadir and a maximum saturation of 100% with a mean saturation of 95%                                   O2 Saturation (minutes) <89%:   2.3   minutes        Pulse Rate Statistics:   Pulse Mean (bpm): 54 bpm                Pulse Range:   Between 40 and 115 bpm              IMPRESSION:  This HST confirms the ongoing presence of severe obstructive sleep apnea.  There is a clear REM sleep preference and supine sleep accentuation for the patient's sleep apnea but even non-REM sleep and nonsupine sleep are still associated with severe apnea.  The patient needs to continue positive airway pressure therapy.  His in-lab sleep study from Nov 11, 2015 had shown also severe sleep apnea with the same distribution between REM and non-REM sleep, The patient had at the time being titrated to 8 cm water pressureThe patient had at the time being titrated to 8 cm water pressure.   RECOMMENDATION: renewal of CPAP machine replacement with autotitration device, set between 5-15 cm water, 2 cm EPR and humidifier and interface of patient's choice.   RV between day 30 and day 80 of new CPAP use with Np     INTERPRETING PHYSICIAN:   Lyzette Reinhardt, MD  [facility].          

## 2022-11-30 ENCOUNTER — Ambulatory Visit (INDEPENDENT_AMBULATORY_CARE_PROVIDER_SITE_OTHER): Payer: 59 | Admitting: Family Medicine

## 2022-11-30 ENCOUNTER — Encounter: Payer: Self-pay | Admitting: Neurology

## 2022-11-30 VITALS — BP 128/60 | HR 52 | Temp 98.0°F | Ht 72.0 in | Wt 225.2 lb

## 2022-11-30 DIAGNOSIS — Z2989 Encounter for other specified prophylactic measures: Secondary | ICD-10-CM

## 2022-11-30 DIAGNOSIS — Z8619 Personal history of other infectious and parasitic diseases: Secondary | ICD-10-CM

## 2022-11-30 DIAGNOSIS — I10 Essential (primary) hypertension: Secondary | ICD-10-CM

## 2022-11-30 DIAGNOSIS — Z8673 Personal history of transient ischemic attack (TIA), and cerebral infarction without residual deficits: Secondary | ICD-10-CM

## 2022-11-30 MED ORDER — DOXYCYCLINE HYCLATE 100 MG PO TABS
100.0000 mg | ORAL_TABLET | Freq: Every day | ORAL | 0 refills | Status: DC
Start: 2022-11-30 — End: 2023-05-10
  Filled 2022-11-30: qty 65, 65d supply, fill #0

## 2022-11-30 MED ORDER — AZITHROMYCIN 250 MG PO TABS
ORAL_TABLET | ORAL | 0 refills | Status: AC
Start: 2022-11-30 — End: 2022-12-09
  Filled 2022-11-30: qty 6, 5d supply, fill #0

## 2022-11-30 NOTE — Progress Notes (Signed)
Subjective:  Patient ID: Raymond Green, male    DOB: 1966/02/09  Age: 57 y.o. MRN: [id number]  CC:  Chief Complaint  Patient presents with   Travel Consult    Pt is traveling to [place] in August and needs to make sure he has the appropriate medications    Sinusitis    MRI showed possibility of acute sinusitis and pt does note he has had some newer congestion on Right side see phone note 11/29/22   Hypertension    Pt notes will have high BP readings 150/80s in the am upon waking but as the day goes on he gets back to a normal range didn't know if this was a cause for concern     HPI Raymond Green presents for   Hypertension: As above elevated readings in the morning 150-160/98-100 only checking once, then improves in few hours - 1120-130/76. losartan 100 mg daily, Bystolic 20 mg daily, amlodipine 5 mg daily. Taking all meds in am.  Home readings: BP Readings from Last 3 Encounters:  11/30/22 128/60  10/30/22 (!) 154/88  10/25/22 (!) 146/81   Lab Results  Component Value Date   CREATININE 1.30 (H) 10/30/2022   Sinusitis Recent MRI through neurology with mucosal edema noted in sinuses, concern for active generalized sinusitis.  MRI obtained on 5/28, had recent procedure with ENT, with endoscopic nasal polypectomy, frontal ethmoidectomy and maxillary sinus surgery on May 13.  Office visit follow-up on May 28 with Dr. Pollyann Kennedy noted for 2-week postop.  No obstruction or polyps identified, some residual packing in the ethmoid cavities, no external swelling.  Continued on saline nasal spray at that time.  6-week follow-up planned. Congestion in R sinus at night at times - past 2 weeks, not during the day. No face pain or tooth pain, no fever, no discolored nasal discharge. Using saline NS throughout the day.    Arterial ischemic stroke, MCA right, chronic. Followed by neurology.  Remote stroke detected on CT head from 09/08/2022 localized to right corona radiata.  Intermittent left  arm weakness.  Referred to neurology in March after review of CT head. Evaluated by neurology May 8.  Initially cleared for sinus surgery, and with remote stroke, aspirin was held initially. Echocardiogram with normal biventricular function without evidence of significant valvular heart disease, EF 60 to 65% and no intracardiac source of embolism.  Carotid ultrasound without high-grade stenosis. MRI brain on June 5 with chronic lacunar infarct in the right corona radiata, mild chronic white matter disease.  Active generalized sinusitis as above.  Plan for follow-up with Dr. Pearlean Brownie.  Still on ASA. No weakness or new symptoms.  HTN control for lacunar stroke noted by neuro.   Travel counseling [place], 1 month 8/2-9/4. [employer] family.  Doxycycline for prior malaria prophylaxis in 2022 - tolerated.  Abx for travelers diarrhea.  Yellow fever vaccine within past 10 years.  Has received polio vaccine.  Unknown last MMR.  Immunization History  Administered Date(s) Administered   Fluad Quad(high Dose 65+) 03/10/2022   Influenza,inj,Quad PF,6+ Mos 03/23/2014, 02/22/2015   Influenza-Unspecified 03/19/2017   Meningococcal Conjugate 01/12/2014   PFIZER(Purple Top)SARS-COV-2 Vaccination 07/04/2019, 07/22/2019, 04/02/2020   Tdap 09/01/2015   Zoster Recombinat (Shingrix) 12/13/2017, 02/07/2018, 02/22/2018      History Patient Active Problem List   Diagnosis Date Noted   Arterial ischemic stroke, MCA (middle cerebral artery), right, chronic 11/29/2022   Chronic rhinosinusitis with multiple nasal polyps 11/29/2022   Chronic rhinitis  11/23/2020   Deviated septum 11/23/2020   Mild coronary artery disease 02/12/2019   Mixed hyperlipidemia 01/14/2019   Chest pain 01/13/2019   Palpitations 01/13/2019   Low back pain 07/13/2016   Abdominal pain, left lower quadrant 05/17/2016   Elevated serum creatinine 05/29/2015   Gout 10/29/2013   Primary hypertension 03/02/2008   GERD  03/02/2008   Iron deficiency anemia 12/13/2007   HEMORRHOIDS-EXTERNAL 12/13/2007   Past Medical History:  Diagnosis Date   Allergy    Anemia    Dysrhythmia    Erosive gastritis 2016   GERD (gastroesophageal reflux disease)    Headache(784.0)    Hemorrhoids    Hyperlipidemia    Hypertension    Kidney insufficiency    LVH (left ventricular hypertrophy)    Dr. Donnie Aho ( cardiology) palpitations   Obesity    OSA (obstructive sleep apnea)    Positive PPD, treated 2000   INH   Recurrent upper respiratory infection (URI)    Sleep apnea    uses cpap   Stroke (HCC)    mimi   Vitamin D deficiency    Past Surgical History:  Procedure Laterality Date   COLONOSCOPY  12/28/2011   Procedure: COLONOSCOPY;  Surgeon: [name], MD;  Location: [facility];  Service: Endoscopy;  Laterality: N/A;   COLONOSCOPY     HEMORRHOID SURGERY     NASAL SINUS SURGERY Bilateral 10/30/2022   Procedure: ENDOSCOPIC NASAL POLYPECTOMY; FRONTAL ETHMOIDECTOMY AND MAXILLARY SINUS SURGERY;  Surgeon: [name], MD;  Location[facility] OR;  Service: ENT;  Laterality: Bilateral;   UPPER GASTROINTESTINAL ENDOSCOPY     Allergies  Allergen Reactions   Lisinopril     angioedema   Shrimp [Shellfish Allergy] Nausea And Vomiting   Prior to Admission medications   Medication Sig Start Date End Date Taking? Authorizing Provider  amLODipine (NORVASC) 5 MG tablet Take 1 tablet (5 mg total) by mouth daily. 10/04/22  Yes Shade Flood, MD  aspirin EC 81 MG tablet Take 81 mg by mouth daily. Swallow whole.   Yes [provider]  atorvastatin (LIPITOR) 40 MG tablet Take 1 tablet (40 mg total) by mouth daily. 11/07/22  Yes Shade Flood, MD  HYDROcodone-acetaminophen (NORCO) 7.5-325 MG tablet Take 1 tablet by mouth every 6 (six) hours as needed for moderate pain. 10/30/22  Yes [name], MD  losartan (COZAAR) 100 MG tablet Take 1 tablet (100 mg total) by mouth daily. 10/04/22  Yes Shade Flood, MD   Nebivolol HCl (BYSTOLIC) 20 MG TABS Take 1 tablet (20 mg total) by mouth daily. 10/04/22  Yes Shade Flood, MD  omeprazole (PRILOSEC) 40 MG capsule Take 1 capsule (40 mg total) by mouth daily. 10/04/22  Yes Shade Flood, MD  ondansetron (ZOFRAN-ODT) 4 MG disintegrating tablet Dissolve 1 tablet (4 mg total) by mouth every 8 (eight) hours as needed for nausea or vomiting. 10/30/22  Yes [name], MD  spironolactone (ALDACTONE) 25 MG tablet Take 1 tablet (25 mg total) by mouth daily. 09/25/22  Yes    Social History   Socioeconomic History   Marital status: Married    Spouse name: Not on file   Number of children: 0   Years of education: Not on file   Highest education level: Not on file  Occupational History   Occupation: ENDOSCOPY TECHNICIAN    Employer: [employer]  Tobacco Use   Smoking status: Never   Smokeless tobacco: Never  Vaping Use   Vaping Use: Never  used  Substance and Sexual Activity   Alcohol use: Not Currently    Comment: occasional   Drug use: Never   Sexual activity: Yes  Other Topics Concern   Not on file  Social History Narrative   Married   Education: Automotive engineer   Exercise: Yes   Social Determinants of Health   Financial Resource Strain: Not on file  Food Insecurity: Food Insecurity Present (09/02/2022)   Hunger Vital Sign    Worried About Running Out of Food in the Last Year: Sometimes true    Ran Out of Food in the Last Year: Sometimes true  Transportation Needs: No Transportation Needs (09/02/2022)   PRAPARE - Administrator, Civil Service (Medical): No    Lack of Transportation (Non-Medical): No  Physical Activity: Not on file  Stress: Not on file  Social Connections: Not on file  Intimate Partner Violence: Not At Risk (09/02/2022)   Humiliation, Afraid, Rape, and Kick questionnaire    Fear of Current or Ex-Partner: No    Emotionally Abused: No    Physically Abused: No    Sexually Abused: No    Review of Systems Per HPI    Objective:   Vitals:   11/30/22 1550  BP: 128/60  Pulse: (!) 52  Temp: 98 F (36.7 C)  TempSrc: Temporal  SpO2: 98%  Weight: 225 lb 3.2 oz (102.2 kg)  Height: 6' (1.829 m)    Physical Exam Vitals reviewed.  Constitutional:      Appearance: He is well-developed.  HENT:     Head: Normocephalic and atraumatic.  Neck:     Vascular: No carotid bruit or JVD.  Cardiovascular:     Rate and Rhythm: Normal rate and regular rhythm.     Heart sounds: Normal heart sounds. No murmur heard. Pulmonary:     Effort: Pulmonary effort is normal.     Breath sounds: Normal breath sounds. No rales.  Musculoskeletal:     Right lower leg: No edema.     Left lower leg: No edema.  Skin:    General: Skin is warm and dry.  Neurological:     General: No focal deficit present.     Mental Status: He is alert and oriented to person, place, and time.     Comments: Grossly equal grip strength, upper extremity strength bilaterally.  Psychiatric:        Mood and Affect: Mood normal.        Assessment & Plan:  Raymond Green is a 57 y.o. male . H/O traveler's diarrhea - Plan: azithromycin (ZITHROMAX) 250 MG tablet  -Azithromycin prescribed if needed for traveler's diarrhea, and discussed need for medical evaluation if persistent symptoms after use.  Need for malaria prophylaxis - Plan: doxycycline (VIBRA-TABS) 100 MG tablet  -Doxycycline day prior to travel, continue for 4 weeks upon return  -Will have a follow-up visit to review other recommendations on his vaccine record.  Did discuss the CDC website for travel including measles, polio advisories.  Can review further at upcoming video visit.  Arterial ischemic stroke, MCA (middle cerebral artery), right, chronic  -Denies new weakness, new symptoms, on aspirin.  Follow-up with neurology as planned.  -Minimal right-sided sinus symptoms in the evening, not during the day, does not appear to be acute sinusitis based on description but  recommended he contact ENT given recent imaging results and recent surgery.  Primary hypertension  -Variable readings at home, including higher readings in the morning.  Will try changing  his amlodipine to evening dosing for some cross coverage with medication to see if that may be more effective.  Continue home monitoring with RTC precautions.  Meds ordered this encounter  Medications   doxycycline (VIBRA-TABS) 100 MG tablet    Sig: Take 1 tablet (100 mg total) by mouth daily. With travel and for 4 weeks upon return.    Dispense:  65 tablet    Refill:  0   azithromycin (ZITHROMAX) 250 MG tablet    Sig: Take 2 tablets on day 1, then 1 tablet daily on days 2 through 5    Dispense:  6 tablet    Refill:  0   Patient Instructions  Try taking amlodipine at night for now, and keep a record of your blood pressures outside of the office for next visit.   Call ENT - Dr. Pollyann Kennedy about the nighttime R sided congestion and recent MRI. Without daytime symptoms I am hesitant to start antibiotic, but I would like ENT to help make that decision. Let me know if I need to call Dr. Pollyann Kennedy as well.   For travel - I will write azithromycin again if needed for travelers diarrhea only.  Doxycycline - start day prior to travel and continue for 4 weeks upon return.  Send me a copy of your vaccine cards prior to follow up video visit in next 2 weeks.    How to Take Your Blood Pressure Blood pressure is a measurement of how strongly your blood is pressing against the walls of your arteries. Arteries are blood vessels that carry blood from your heart throughout your body. Your health care provider takes your blood pressure at each office visit. You can also take your own blood pressure at home with a blood pressure monitor. You may need to take your own blood pressure to: Confirm a diagnosis of high blood pressure (hypertension). Monitor your blood pressure over time. Make sure your blood pressure medicine is  working. Supplies needed: Blood pressure monitor. A chair to sit in. This should be a chair where you can sit upright with your back supported. Do not sit on a soft couch or an armchair. Table or desk. Small notebook and pencil or pen. How to prepare To get the most accurate reading, avoid the following for 30 minutes before you check your blood pressure: Drinking caffeine. Drinking alcohol. Eating. Smoking. Exercising. Five minutes before you check your blood pressure: Use the bathroom and urinate so that you have an empty bladder. Sit quietly in a chair. Do not talk. How to take your blood pressure To check your blood pressure, follow the instructions in the manual that came with your blood pressure monitor. If you have a digital blood pressure monitor, the instructions may be as follows: Sit up straight in a chair. Place your feet on the floor. Do not cross your ankles or legs. Rest your left arm at the level of your heart on a table or desk or on the arm of a chair. Pull up your shirt sleeve. Wrap the blood pressure cuff around the upper part of your left arm, 1 inch (2.5 cm) above your elbow. It is best to wrap the cuff around bare skin. Fit the cuff snugly, but not too tightly, around your arm. You should be able to place only one finger between the cuff and your arm. Position the cord so that it rests in the bend of your elbow. Press the power button. Sit quietly while the cuff inflates and  deflates. Read the digital reading on the monitor screen and write the numbers down (record them) in a notebook. Wait 2-3 minutes, then repeat the steps, starting at step 1. What does my blood pressure reading mean? A blood pressure reading consists of a higher number over a lower number. Ideally, your blood pressure should be below 120/80. The first ("top") number is called the systolic pressure. It is a measure of the pressure in your arteries as your heart beats. The second ("bottom")  number is called the diastolic pressure. It is a measure of the pressure in your arteries as the heart relaxes. Blood pressure is classified into four stages. The following are the stages for adults who do not have a short-term serious illness or a chronic condition. Systolic pressure and diastolic pressure are measured in a unit called mm Hg (millimeters of mercury).  Normal Systolic pressure: below 120. Diastolic pressure: below 80. Elevated Systolic pressure: 120-129. Diastolic pressure: below 80. Hypertension stage 1 Systolic pressure: 130-139. Diastolic pressure: 80-89. Hypertension stage 2 Systolic pressure: 140 or above. Diastolic pressure: 90 or above. You can have elevated blood pressure or hypertension even if only the systolic or only the diastolic number in your reading is higher than normal. Follow these instructions at home: Medicines Take over-the-counter and prescription medicines only as told by your health care provider. Tell your health care provider if you are having any side effects from blood pressure medicine. General instructions Check your blood pressure as often as recommended by your health care provider. Check your blood pressure at the same time every day. Take your monitor to the next appointment with your health care provider to make sure that: You are using it correctly. It provides accurate readings. Understand what your goal blood pressure numbers are. Keep all follow-up visits. This is important. General tips Your health care provider can suggest a reliable monitor that will meet your needs. There are several types of home blood pressure monitors. Choose a monitor that has an arm cuff. Do not choose a monitor that measures your blood pressure from your wrist or finger. Choose a cuff that wraps snugly, not too tight or too loose, around your upper arm. You should be able to fit only one finger between your arm and the cuff. You can buy a blood pressure  monitor at most drugstores or online. Where to find more information American Heart Association: www.heart.org Contact a health care provider if: Your blood pressure is consistently high. Your blood pressure is suddenly low. Get help right away if: Your systolic blood pressure is higher than 180. Your diastolic blood pressure is higher than 120. These symptoms may be an emergency. Get help right away. Call 911. Do not wait to see if the symptoms will go away. Do not drive yourself to the hospital. Summary Blood pressure is a measurement of how strongly your blood is pressing against the walls of your arteries. A blood pressure reading consists of a higher number over a lower number. Ideally, your blood pressure should be below 120/80. Check your blood pressure at the same time every day. Avoid caffeine, alcohol, smoking, and exercise for 30 minutes prior to checking your blood pressure. These agents can affect the accuracy of the blood pressure reading. This information is not intended to replace advice given to you by your health care provider. Make sure you discuss any questions you have with your health care provider. Document Revised: 02/17/2021 Document Reviewed: 02/17/2021 Elsevier Patient Education  2024 [employer].  Signed,   [name], MD [facility], [facility] 11/30/22 4:57 PM

## 2022-11-30 NOTE — Progress Notes (Signed)
Kindly inform the patient that MRI scan of the brain shows an old stroke in the deep portion of the brain on the right side.  No new or worrisome findings

## 2022-11-30 NOTE — Patient Instructions (Addendum)
Try taking amlodipine at night for now, and keep a record of your blood pressures outside of the office for next visit.   Call ENT - Dr. Pollyann Kennedy about the nighttime R sided congestion and recent MRI. Without daytime symptoms I am hesitant to start antibiotic, but I would like ENT to help make that decision. Let me know if I need to call Dr. Pollyann Kennedy as well.   For travel - I will write azithromycin again if needed for travelers diarrhea only.  Doxycycline - start day prior to travel and continue for 4 weeks upon return.  Send me a copy of your vaccine cards prior to follow up video visit in next 2 weeks.    How to Take Your Blood Pressure Blood pressure is a measurement of how strongly your blood is pressing against the walls of your arteries. Arteries are blood vessels that carry blood from your heart throughout your body. Your health care provider takes your blood pressure at each office visit. You can also take your own blood pressure at home with a blood pressure monitor. You may need to take your own blood pressure to: Confirm a diagnosis of high blood pressure (hypertension). Monitor your blood pressure over time. Make sure your blood pressure medicine is working. Supplies needed: Blood pressure monitor. A chair to sit in. This should be a chair where you can sit upright with your back supported. Do not sit on a soft couch or an armchair. Table or desk. Small notebook and pencil or pen. How to prepare To get the most accurate reading, avoid the following for 30 minutes before you check your blood pressure: Drinking caffeine. Drinking alcohol. Eating. Smoking. Exercising. Five minutes before you check your blood pressure: Use the bathroom and urinate so that you have an empty bladder. Sit quietly in a chair. Do not talk. How to take your blood pressure To check your blood pressure, follow the instructions in the manual that came with your blood pressure monitor. If you have a digital  blood pressure monitor, the instructions may be as follows: Sit up straight in a chair. Place your feet on the floor. Do not cross your ankles or legs. Rest your left arm at the level of your heart on a table or desk or on the arm of a chair. Pull up your shirt sleeve. Wrap the blood pressure cuff around the upper part of your left arm, 1 inch (2.5 cm) above your elbow. It is best to wrap the cuff around bare skin. Fit the cuff snugly, but not too tightly, around your arm. You should be able to place only one finger between the cuff and your arm. Position the cord so that it rests in the bend of your elbow. Press the power button. Sit quietly while the cuff inflates and deflates. Read the digital reading on the monitor screen and write the numbers down (record them) in a notebook. Wait 2-3 minutes, then repeat the steps, starting at step 1. What does my blood pressure reading mean? A blood pressure reading consists of a higher number over a lower number. Ideally, your blood pressure should be below 120/80. The first ("top") number is called the systolic pressure. It is a measure of the pressure in your arteries as your heart beats. The second ("bottom") number is called the diastolic pressure. It is a measure of the pressure in your arteries as the heart relaxes. Blood pressure is classified into four stages. The following are the stages for adults  who do not have a short-term serious illness or a chronic condition. Systolic pressure and diastolic pressure are measured in a unit called mm Hg (millimeters of mercury).  Normal Systolic pressure: below 120. Diastolic pressure: below 80. Elevated Systolic pressure: 120-129. Diastolic pressure: below 80. Hypertension stage 1 Systolic pressure: 130-139. Diastolic pressure: 80-89. Hypertension stage 2 Systolic pressure: 140 or above. Diastolic pressure: 90 or above. You can have elevated blood pressure or hypertension even if only the systolic or  only the diastolic number in your reading is higher than normal. Follow these instructions at home: Medicines Take over-the-counter and prescription medicines only as told by your health care provider. Tell your health care provider if you are having any side effects from blood pressure medicine. General instructions Check your blood pressure as often as recommended by your health care provider. Check your blood pressure at the same time every day. Take your monitor to the next appointment with your health care provider to make sure that: You are using it correctly. It provides accurate readings. Understand what your goal blood pressure numbers are. Keep all follow-up visits. This is important. General tips Your health care provider can suggest a reliable monitor that will meet your needs. There are several types of home blood pressure monitors. Choose a monitor that has an arm cuff. Do not choose a monitor that measures your blood pressure from your wrist or finger. Choose a cuff that wraps snugly, not too tight or too loose, around your upper arm. You should be able to fit only one finger between your arm and the cuff. You can buy a blood pressure monitor at most drugstores or online. Where to find more information American Heart Association: www.heart.org Contact a health care provider if: Your blood pressure is consistently high. Your blood pressure is suddenly low. Get help right away if: Your systolic blood pressure is higher than 180. Your diastolic blood pressure is higher than 120. These symptoms may be an emergency. Get help right away. Call 911. Do not wait to see if the symptoms will go away. Do not drive yourself to the hospital. Summary Blood pressure is a measurement of how strongly your blood is pressing against the walls of your arteries. A blood pressure reading consists of a higher number over a lower number. Ideally, your blood pressure should be below  120/80. Check your blood pressure at the same time every day. Avoid caffeine, alcohol, smoking, and exercise for 30 minutes prior to checking your blood pressure. These agents can affect the accuracy of the blood pressure reading. This information is not intended to replace advice given to you by your health care provider. Make sure you discuss any questions you have with your health care provider. Document Revised: 02/17/2021 Document Reviewed: 02/17/2021 Elsevier Patient Education  2024 [employer].

## 2022-11-30 NOTE — Telephone Encounter (Signed)
Pt called back. °

## 2022-11-30 NOTE — Telephone Encounter (Signed)
Left vm to call office

## 2022-12-01 ENCOUNTER — Other Ambulatory Visit (HOSPITAL_COMMUNITY): Payer: Self-pay

## 2022-12-04 ENCOUNTER — Other Ambulatory Visit: Payer: Self-pay

## 2022-12-07 ENCOUNTER — Encounter: Payer: Self-pay | Admitting: Family Medicine

## 2022-12-14 ENCOUNTER — Telehealth (INDEPENDENT_AMBULATORY_CARE_PROVIDER_SITE_OTHER): Payer: 59 | Admitting: Family Medicine

## 2022-12-14 DIAGNOSIS — Z7184 Encounter for health counseling related to travel: Secondary | ICD-10-CM | POA: Diagnosis not present

## 2022-12-14 DIAGNOSIS — I10 Essential (primary) hypertension: Secondary | ICD-10-CM

## 2022-12-14 NOTE — Patient Instructions (Addendum)
Try taking losartan and amlodipine in the evening to see if that evens out readings and send me an update in the next 2 weeks.   I would recommend meeting with travel department at health department to discuss the updated typhoid vaccine, and need for measles or polio vaccine updates.   International Travel  [place], [place] ([email or web address])

## 2022-12-14 NOTE — Progress Notes (Signed)
Virtual Visit via Video Note  I connected with Raymond Green on 12/14/22 at 4:26 PM by a video enabled telemedicine application and verified that I am speaking with the correct person using two identifiers.  Patient location: home, by self.  My location: office - [place].    I discussed the limitations, risks, security and privacy concerns of performing an evaluation and management service by telephone and the availability of in person appointments. I also discussed with the patient that there may be a patient responsible charge related to this service. The patient expressed understanding and agreed to proceed, consent obtained  Chief complaint:  Chief Complaint  Patient presents with   Hypertension    BP has been okay notes about the same as last month 150/80 am 120/60 in afternoons     History of Present Illness: Raymond Green is a 57 y.o. male  Hypertension: Follow-up from June 13, under care of neurology with arterial ischemic stroke, right corona radiata.  Chronic lacunar infarct.  Plan for hypertension control given lacunar stroke.  Elevated blood pressures have been discussed at home but normal in office at his last visit.  Adjusted amlodipine to evening dosing for some cross coverage of medication. Still having controlled BP at night, but up in the morning. 150/80-85, goes to work, then in the evening 120/65-70.   Home readings: BP Readings from Last 3 Encounters:  11/30/22 128/60  10/30/22 (!) 154/88  10/25/22 (!) 146/81   Lab Results  Component Value Date   CREATININE 1.30 (H) 10/30/2022    Travel counseling: Travel in August, discussed last visit.   Vaccine card reviewed, hepatitis A in August 2015, typhoid August 2015, Tdap August 2015, meningococcal vaccine July 2015.    Patient Active Problem List   Diagnosis Date Noted   Arterial ischemic stroke, MCA (middle cerebral artery), right, chronic 11/29/2022   Chronic rhinosinusitis with  multiple nasal polyps 11/29/2022   Chronic rhinitis 11/23/2020   Deviated septum 11/23/2020   Mild coronary artery disease 02/12/2019   Mixed hyperlipidemia 01/14/2019   Chest pain 01/13/2019   Palpitations 01/13/2019   Low back pain 07/13/2016   Abdominal pain, left lower quadrant 05/17/2016   Elevated serum creatinine 05/29/2015   Gout 10/29/2013   Primary hypertension 03/02/2008   GERD 03/02/2008   Iron deficiency anemia 12/13/2007   HEMORRHOIDS-EXTERNAL 12/13/2007   Past Medical History:  Diagnosis Date   Allergy    Anemia    Dysrhythmia    Erosive gastritis 2016   GERD (gastroesophageal reflux disease)    Headache(784.0)    Hemorrhoids    Hyperlipidemia    Hypertension    Kidney insufficiency    LVH (left ventricular hypertrophy)    Dr. Donnie Aho ( cardiology) palpitations   Obesity    OSA (obstructive sleep apnea)    Positive PPD, treated 2000   INH   Recurrent upper respiratory infection (URI)    Sleep apnea    uses cpap   Stroke (HCC)    mimi   Vitamin D deficiency    Past Surgical History:  Procedure Laterality Date   COLONOSCOPY  12/28/2011   Procedure: COLONOSCOPY;  Surgeon: [name], MD;  Location: [facility];  Service: Endoscopy;  Laterality: N/A;   COLONOSCOPY     HEMORRHOID SURGERY     NASAL SINUS SURGERY Bilateral 10/30/2022   Procedure: ENDOSCOPIC NASAL POLYPECTOMY; FRONTAL ETHMOIDECTOMY AND MAXILLARY SINUS SURGERY;  Surgeon: [name], MD;  [facility] OR;  Service: ENT;  Laterality: Bilateral;   UPPER GASTROINTESTINAL ENDOSCOPY     Allergies  Allergen Reactions   Lisinopril     angioedema   Shrimp [Shellfish Allergy] Nausea And Vomiting   Prior to Admission medications   Medication Sig Start Date End Date Taking? Authorizing Provider  amLODipine (NORVASC) 5 MG tablet Take 1 tablet (5 mg total) by mouth daily. 10/04/22  Yes Shade Flood, MD  aspirin EC 81 MG tablet Take 81 mg by mouth daily. Swallow whole.   Yes [provider]  atorvastatin (LIPITOR) 40 MG tablet Take 1 tablet (40 mg total) by mouth daily. 11/07/22  Yes Shade Flood, MD  doxycycline (VIBRA-TABS) 100 MG tablet Take 1 tablet (100 mg total) by mouth daily. With travel and for 4 weeks upon return. 11/30/22  Yes Shade Flood, MD  HYDROcodone-acetaminophen (NORCO) 7.5-325 MG tablet Take 1 tablet by mouth every 6 (six) hours as needed for moderate pain. 10/30/22  Yes [name], MD  losartan (COZAAR) 100 MG tablet Take 1 tablet (100 mg total) by mouth daily. 10/04/22  Yes Shade Flood, MD  Nebivolol HCl (BYSTOLIC) 20 MG TABS Take 1 tablet (20 mg total) by mouth daily. 10/04/22  Yes Shade Flood, MD  omeprazole (PRILOSEC) 40 MG capsule Take 1 capsule (40 mg total) by mouth daily. 10/04/22  Yes Shade Flood, MD  ondansetron (ZOFRAN-ODT) 4 MG disintegrating tablet Dissolve 1 tablet (4 mg total) by mouth every 8 (eight) hours as needed for nausea or vomiting. 10/30/22  Yes [name], MD  spironolactone (ALDACTONE) 25 MG tablet Take 1 tablet (25 mg total) by mouth daily. 09/25/22  Yes    Social History   Socioeconomic History   Marital status: Married    Spouse name: Not on file   Number of children: 0   Years of education: Not on file   Highest education level: Not on file  Occupational History   Occupation: ENDOSCOPY TECHNICIAN    Employer: [employer]  Tobacco Use   Smoking status: Never   Smokeless tobacco: Never  Vaping Use   Vaping Use: Never used  Substance and Sexual Activity   Alcohol use: Not Currently    Comment: occasional   Drug use: Never   Sexual activity: Yes  Other Topics Concern   Not on file  Social History Narrative   Married   Education: Automotive engineer   Exercise: Yes   Social Determinants of Health   Financial Resource Strain: Not on file  Food Insecurity: Food Insecurity Present (09/02/2022)   Hunger Vital Sign    Worried About Running Out of Food in the Last Year: Sometimes true    Ran Out  of Food in the Last Year: Sometimes true  Transportation Needs: No Transportation Needs (09/02/2022)   PRAPARE - Administrator, Civil Service (Medical): No    Lack of Transportation (Non-Medical): No  Physical Activity: Not on file  Stress: Not on file  Social Connections: Not on file  Intimate Partner Violence: Not At Risk (09/02/2022)   Humiliation, Afraid, Rape, and Kick questionnaire    Fear of Current or Ex-Partner: No    Emotionally Abused: No    Physically Abused: No    Sexually Abused: No    Observations/Objective: Nontoxic appearance on video, speaking in full sentences, no respiratory distress.  Euthymic mood.  All questions answered with understanding plan expressed  Assessment and Plan: Primary hypertension  -Still elevated morning readings.  Will change losartan to  evening dosing along with amlodipine.  Monitor home readings and RTC precautions given.  Update next few weeks.  Travel advice encounter  -Doxycycline prescribed last visit for malaria prophylaxis.  Based on location of travel recommend he discuss with health department updated typhoid vaccine and whether or not he needs measles and polio updated vaccine for CDC travel guidelines.  Phone number provided for [facility] department travel clinic.   Follow Up Instructions: Patient Instructions  Try taking losartan and amlodipine in the evening to see if that evens out readings and send me an update in the next 2 weeks.   I would recommend meeting with travel department at health department to discuss the updated typhoid vaccine, and need for measles or polio vaccine updates.   International Travel  [place], [place] ([email or web address])     I discussed the assessment and treatment plan with the patient. The patient was provided an opportunity to ask questions and all were answered. The patient agreed with the plan and demonstrated an understanding of the instructions.   The patient  was advised to call back or seek an in-person evaluation if the symptoms worsen or if the condition fails to improve as anticipated.   Shade Flood, MD

## 2022-12-15 ENCOUNTER — Encounter: Payer: Self-pay | Admitting: Family Medicine

## 2022-12-18 ENCOUNTER — Encounter: Payer: Self-pay | Admitting: Neurology

## 2022-12-19 ENCOUNTER — Encounter: Payer: Self-pay | Admitting: Neurology

## 2022-12-19 ENCOUNTER — Ambulatory Visit: Payer: 59 | Admitting: Neurology

## 2022-12-19 VITALS — BP 113/73 | HR 42 | Ht 72.0 in | Wt 223.0 lb

## 2022-12-19 DIAGNOSIS — I209 Angina pectoris, unspecified: Secondary | ICD-10-CM | POA: Diagnosis not present

## 2022-12-19 DIAGNOSIS — I10 Essential (primary) hypertension: Secondary | ICD-10-CM | POA: Diagnosis not present

## 2022-12-19 DIAGNOSIS — I70212 Atherosclerosis of native arteries of extremities with intermittent claudication, left leg: Secondary | ICD-10-CM | POA: Diagnosis not present

## 2022-12-19 DIAGNOSIS — Z8673 Personal history of transient ischemic attack (TIA), and cerebral infarction without residual deficits: Secondary | ICD-10-CM | POA: Diagnosis not present

## 2022-12-19 NOTE — Progress Notes (Signed)
Provider:  Melvyn Novas, MD  Primary Care Physician:  Shade Flood, MD [address] [place] [address]     Referring Provider: Shade Flood, Md [address] [place],  [place] [address]          Chief Complaint according to patient   Patient presents with:     New Patient (Initial Visit)           HISTORY OF PRESENT ILLNESS:  Raymond Green is a 57 y.o. male patient who is here for revisit 12/19/2022 for  follow up on stroke risk factor evaluation, after a on and off weakness in the left arm. I will  follow up with Stroke MD and PCP after this visit. I will continue the sleep care.     HST revealed severe sleep apnea, and CPAP was ordered.  ( 11-27-2022 responded)   Echo normal EF and  mildly dilated  atrium left and right heart , dilated aorta. ( Dr Teresa Coombs )   Vas [place] carotis, no stenosis. Dr. Teresa Coombs     MRI revealed lacunar remote stroke in the right basal ganglia, capsula int. Dr Pearlean Brownie.     Review of Systems:   HTN - followed by Dr Neva Seat, Hypertension: As above elevated readings in the morning 150-160/98-100 only checking once, then improves in few hours - 1120-130/76. losartan 100 mg daily, Bystolic 20 mg daily, amlodipine 5 mg daily. Taking all meds in am.  Home readings:    BP Readings from Last 3 Encounters:  11/30/22 128/60  10/30/22 (!) 154/88  10/25/22 (!) 146/81    Sinusitis- seen Dr Pollyann Kennedy  who prescribed medication     OSA  Out of a complete 14 system review, the patient complains of only the following symptoms, and all other reviewed systems are negative.:    How likely are you to doze in the following situations: 0 = not likely, 1 = slight chance, 2 = moderate chance, 3 = high chance   Sitting and Reading? Watching Television? Sitting inactive in a public place (theater or meeting)? As a passenger in a car for an hour without a break? Lying down in the afternoon when circumstances permit? Sitting and  talking to someone? Sitting quietly after lunch without alcohol? In a car, while stopped for a few minutes in traffic?   Total = 12/24/ points   FSS endorsed at [employer]/ 63 points.  Shift worker in health care.    Social History   Socioeconomic History   Marital status: Married    Spouse name: Not on file   Number of children: 0   Years of education: Not on file   Highest education level: Not on file  Occupational History   Occupation: ENDOSCOPY TECHNICIAN    Employer: [employer]  Tobacco Use   Smoking status: Never   Smokeless tobacco: Never  Vaping Use   Vaping Use: Never used  Substance and Sexual Activity   Alcohol use: Not Currently    Comment: occasional   Drug use: Never   Sexual activity: Yes  Other Topics Concern   Not on file  Social History Narrative   Married   Education: Automotive engineer   Exercise: Yes   Social Determinants of Health   Financial Resource Strain: Not on file  Food Insecurity: Food Insecurity Present (09/02/2022)   Hunger Vital Sign    Worried About Running Out of Food in the  Last Year: Sometimes true    Ran Out of Food in the Last Year: Sometimes true  Transportation Needs: No Transportation Needs (09/02/2022)   PRAPARE - Administrator, Civil Service (Medical): No    Lack of Transportation (Non-Medical): No  Physical Activity: Not on file  Stress: Not on file  Social Connections: Not on file    Family History  Problem Relation Age of Onset   Diabetes Mother    Heart disease Mother    Hypertension Mother    Hypertension Father    Diabetes Sister    Colon cancer Neg Hx    Colon polyps Neg Hx    Esophageal cancer Neg Hx    Rectal cancer Neg Hx    Stomach cancer Neg Hx    Inflammatory bowel disease Neg Hx    Liver disease Neg Hx    Pancreatic cancer Neg Hx     Past Medical History:  Diagnosis Date   Allergy    Anemia    Dysrhythmia    Erosive gastritis 2016   GERD (gastroesophageal reflux disease)    Headache(784.0)     Hemorrhoids    Hyperlipidemia    Hypertension    Kidney insufficiency    LVH (left ventricular hypertrophy)    Dr. Donnie Aho ( cardiology) palpitations   Obesity    OSA (obstructive sleep apnea)    Positive PPD, treated 2000   INH   Recurrent upper respiratory infection (URI)    Sleep apnea    uses cpap   Stroke (HCC)    mimi   Vitamin D deficiency     Past Surgical History:  Procedure Laterality Date   COLONOSCOPY  12/28/2011   Procedure: COLONOSCOPY;  Surgeon: [name], MD;  Location: [facility];  Service: Endoscopy;  Laterality: N/A;   COLONOSCOPY     HEMORRHOID SURGERY     NASAL SINUS SURGERY Bilateral 10/30/2022   Procedure: ENDOSCOPIC NASAL POLYPECTOMY; FRONTAL ETHMOIDECTOMY AND MAXILLARY SINUS SURGERY;  Surgeon: [name], MD;  Location: [facility] OR;  Service: ENT;  Laterality: Bilateral;   UPPER GASTROINTESTINAL ENDOSCOPY       Current Outpatient Medications on File Prior to Visit  Medication Sig Dispense Refill   amLODipine (NORVASC) 5 MG tablet Take 1 tablet (5 mg total) by mouth daily. 90 tablet 1   aspirin EC 81 MG tablet Take 81 mg by mouth daily. Swallow whole.     atorvastatin (LIPITOR) 40 MG tablet Take 1 tablet (40 mg total) by mouth daily. 90 tablet 0   doxycycline (VIBRA-TABS) 100 MG tablet Take 1 tablet (100 mg total) by mouth daily. With travel and for 4 weeks upon return. 65 tablet 0   losartan (COZAAR) 100 MG tablet Take 1 tablet (100 mg total) by mouth daily. 90 tablet 1   Nebivolol HCl (BYSTOLIC) 20 MG TABS Take 1 tablet (20 mg total) by mouth daily. 90 tablet 1   omeprazole (PRILOSEC) 40 MG capsule Take 1 capsule (40 mg total) by mouth daily. 90 capsule 1   spironolactone (ALDACTONE) 25 MG tablet Take 1 tablet (25 mg total) by mouth daily. 90 tablet 3   No current facility-administered medications on file prior to visit.    Allergies  Allergen Reactions   Lisinopril     angioedema   Shrimp [Shellfish Allergy] Nausea And Vomiting      DIAGNOSTIC DATA (LABS, IMAGING, TESTING) - I reviewed patient records, labs, notes, testing and imaging myself where available.  Lab Results  Component Value Date   WBC 5.9 09/18/2022   HGB 10.2 (L) 10/30/2022   HCT 30.0 (L) 10/30/2022   MCV 89.2 09/18/2022   PLT 166.0 09/18/2022      Component Value Date/Time   NA 140 10/30/2022 0940   NA 143 08/20/2019 1733   K 4.6 10/30/2022 0940   CL 103 10/30/2022 0940   CO2 30 09/18/2022 1517   GLUCOSE 81 10/30/2022 0940   BUN 21 (H) 10/30/2022 0940   BUN 17 08/20/2019 1733   CREATININE 1.30 (H) 10/30/2022 0940   CREATININE 1.48 (H) 04/03/2016 1319   CALCIUM 9.9 09/18/2022 1517   PROT 7.4 09/18/2022 1517   PROT 7.3 08/20/2019 1733   ALBUMIN 4.3 09/18/2022 1517   ALBUMIN 4.5 08/20/2019 1733   AST 18 09/18/2022 1517   ALT 12 09/18/2022 1517   ALKPHOS 43 09/18/2022 1517   BILITOT 0.9 09/18/2022 1517   BILITOT 0.4 08/20/2019 1733   GFRNONAA 57 (L) 08/20/2019 1733   GFRNONAA 55 (L) 04/03/2016 1319   GFRAA 66 08/20/2019 1733   GFRAA 63 04/03/2016 1319   Lab Results  Component Value Date   CHOL 173 09/18/2022   HDL 61.00 09/18/2022   LDLCALC 89 09/18/2022   TRIG 115.0 09/18/2022   CHOLHDL 3 09/18/2022   Lab Results  Component Value Date   HGBA1C 5.9 09/18/2022   No results found for: "VITAMINB12" Lab Results  Component Value Date   TSH 1.80 01/04/2022    PHYSICAL EXAM:  Today's Vitals   12/19/22 0800  BP: 113/73  Pulse: (!) 42  Weight: 223 lb (101.2 kg)  Height: 6' (1.829 m)   Body mass index is 30.24 kg/m.   Wt Readings from Last 3 Encounters:  12/19/22 223 lb (101.2 kg)  11/30/22 225 lb 3.2 oz (102.2 kg)  10/30/22 226 lb (102.5 kg)     Ht Readings from Last 3 Encounters:  12/19/22 6' (1.829 m)  11/30/22 6' (1.829 m)  10/30/22 6' (1.829 m)      General: TThe patient is awake, alert and appears not in acute distress.  The patient is well groomed. Head: Normocephalic, atraumatic.  Neck is supple.  Neck  circumference:17 Cardiovascular:  Regular rate and palpable peripheral pulse:  Respratory: clear to auscultation.  Mallampati2 , Skin:   Neurologic exam : The patient is awake and alert, oriented to place and time.  Memory subjective  described as intact.  There is a normal attention span & concentration ability.  Speech is fluent without  dysarthria, dysphonia or aphasia.  Mood and affect are appropriate.   Cranial nerves: Pupils are equal and briskly reactive to light. Funduscopic exam without  evidence of pallor or edema. Extraocular movements  in vertical and horizontal planes intact and without nystagmus. Visual fields by finger perimetry are intact. Hearing to finger rub intact.  Facial sensation intact to fine touch. Facial motor strength is symmetric and tongue and uvula move midline.   Motor exam:   Normal tone and normal muscle bulk and symmetric normal strength in all extremities. Grip Strength is equal !  Proximal strength of shoulder muscles and hip flexors was equal .   Sensory:  Fine touch and vibration were felt less in the tip of all fingers of the left hand,    Coordination: Rapid alternating movements in the fingers/hands were normal.  Finger-to-nose maneuver was tested and showed no evidence of ataxia, dysmetria or tremor.   Gait and station: Patient walked without assistive device . Core  Strength within normal limits. Stance is stable and of normal base.  Deep tendon reflexes: in the  upper and lower extremities are symmetric and  Brisk= and  without Clonus.    ASSESSMENT AND PLAN 57 y.o. year old male  here with:    1)  Lacunar stroke patient  ,main risk factor is HTN, which today is controlled.   2) he mostly eat at home, home cooked  food and is more Sodium conscious, his wife is on board.   3) DASH diet information.  Needs to follow up for risk factors with PCP. I will see hi after his CPAP has been issued .    I plan to follow up either personally  or through our NP  Deborah Chalk Cue  within 2-3 months.   I would like to thank Shade Flood, MD and Shade Flood, Md [address] [place],  [place] [address] for allowing me to meet with and to take care of this pleasant patient.   CC: I will share my notes with Dr Neva Seat and Dr Pearlean Brownie.  After spending a total time of  25  minutes face to face and additional time for physical and neurologic examination, review of laboratory studies,  personal review of imaging studies, reports and results of other testing and review of referral information / records as far as provided in visit,   Electronically signed by: Melvyn Novas, MD 12/19/2022 8:15 AM  [facility] and [facility] Board certified by The [employer] of Sleep Medicine and Diplomate of the [employer] of Sleep Medicine. Board certified In Neurology through the ABPN, Fellow of the [employer] of Neu[employer].

## 2022-12-19 NOTE — Patient Instructions (Signed)

## 2022-12-27 DIAGNOSIS — K279 Peptic ulcer, site unspecified, unspecified as acute or chronic, without hemorrhage or perforation: Secondary | ICD-10-CM | POA: Diagnosis not present

## 2022-12-27 DIAGNOSIS — N182 Chronic kidney disease, stage 2 (mild): Secondary | ICD-10-CM | POA: Diagnosis not present

## 2022-12-27 DIAGNOSIS — I129 Hypertensive chronic kidney disease with stage 1 through stage 4 chronic kidney disease, or unspecified chronic kidney disease: Secondary | ICD-10-CM | POA: Diagnosis not present

## 2022-12-27 DIAGNOSIS — E785 Hyperlipidemia, unspecified: Secondary | ICD-10-CM | POA: Diagnosis not present

## 2022-12-28 LAB — LAB REPORT - SCANNED: EGFR: 49

## 2023-01-04 ENCOUNTER — Other Ambulatory Visit: Payer: Self-pay

## 2023-01-04 ENCOUNTER — Other Ambulatory Visit: Payer: Self-pay | Admitting: Family Medicine

## 2023-01-04 ENCOUNTER — Other Ambulatory Visit (HOSPITAL_COMMUNITY): Payer: Self-pay

## 2023-01-04 MED ORDER — ATORVASTATIN CALCIUM 40 MG PO TABS
40.0000 mg | ORAL_TABLET | Freq: Every day | ORAL | 0 refills | Status: DC
  Filled 2023-01-04 – 2023-03-28 (×2): qty 90, 90d supply, fill #0

## 2023-01-05 DIAGNOSIS — G4733 Obstructive sleep apnea (adult) (pediatric): Secondary | ICD-10-CM | POA: Diagnosis not present

## 2023-01-11 ENCOUNTER — Ambulatory Visit: Payer: 59 | Admitting: Neurology

## 2023-01-17 DIAGNOSIS — G4733 Obstructive sleep apnea (adult) (pediatric): Secondary | ICD-10-CM | POA: Diagnosis not present

## 2023-02-05 DIAGNOSIS — G4733 Obstructive sleep apnea (adult) (pediatric): Secondary | ICD-10-CM | POA: Diagnosis not present

## 2023-03-08 DIAGNOSIS — G4733 Obstructive sleep apnea (adult) (pediatric): Secondary | ICD-10-CM | POA: Diagnosis not present

## 2023-03-20 DIAGNOSIS — J342 Deviated nasal septum: Secondary | ICD-10-CM | POA: Diagnosis not present

## 2023-03-27 ENCOUNTER — Ambulatory Visit: Payer: 59 | Admitting: Neurology

## 2023-03-28 ENCOUNTER — Other Ambulatory Visit: Payer: Self-pay | Admitting: Family Medicine

## 2023-03-28 DIAGNOSIS — K219 Gastro-esophageal reflux disease without esophagitis: Secondary | ICD-10-CM

## 2023-03-28 DIAGNOSIS — I10 Essential (primary) hypertension: Secondary | ICD-10-CM

## 2023-03-29 ENCOUNTER — Other Ambulatory Visit (HOSPITAL_COMMUNITY): Payer: Self-pay

## 2023-03-29 ENCOUNTER — Other Ambulatory Visit: Payer: Self-pay

## 2023-03-29 MED ORDER — OMEPRAZOLE 40 MG PO CPDR
40.0000 mg | DELAYED_RELEASE_CAPSULE | Freq: Every day | ORAL | 1 refills | Status: DC
Start: 2023-03-29 — End: 2023-10-29
  Filled 2023-03-29: qty 90, 90d supply, fill #0
  Filled 2023-07-13 (×2): qty 90, 90d supply, fill #1

## 2023-03-29 MED ORDER — AMLODIPINE BESYLATE 5 MG PO TABS
5.0000 mg | ORAL_TABLET | Freq: Every day | ORAL | 1 refills | Status: DC
Start: 2023-03-29 — End: 2023-10-29
  Filled 2023-03-29: qty 90, 90d supply, fill #0
  Filled 2023-07-13 (×2): qty 90, 90d supply, fill #1

## 2023-03-29 MED ORDER — NEBIVOLOL HCL 20 MG PO TABS
1.0000 | ORAL_TABLET | Freq: Every day | ORAL | 1 refills | Status: DC
Start: 2023-03-29 — End: 2023-10-29
  Filled 2023-03-29: qty 90, 90d supply, fill #0
  Filled 2023-07-13 (×2): qty 90, 90d supply, fill #1

## 2023-03-29 MED ORDER — LOSARTAN POTASSIUM 100 MG PO TABS
100.0000 mg | ORAL_TABLET | Freq: Every day | ORAL | 1 refills | Status: DC
Start: 2023-03-29 — End: 2023-10-29
  Filled 2023-03-29: qty 90, 90d supply, fill #0
  Filled 2023-07-13 (×2): qty 90, 90d supply, fill #1

## 2023-04-07 DIAGNOSIS — G4733 Obstructive sleep apnea (adult) (pediatric): Secondary | ICD-10-CM | POA: Diagnosis not present

## 2023-04-09 ENCOUNTER — Other Ambulatory Visit (HOSPITAL_COMMUNITY): Payer: Self-pay

## 2023-04-09 ENCOUNTER — Encounter: Payer: Self-pay | Admitting: Family Medicine

## 2023-04-09 ENCOUNTER — Ambulatory Visit (INDEPENDENT_AMBULATORY_CARE_PROVIDER_SITE_OTHER): Payer: 59 | Admitting: Family Medicine

## 2023-04-09 VITALS — BP 110/74 | HR 47 | Temp 97.8°F | Ht 72.0 in | Wt 222.4 lb

## 2023-04-09 DIAGNOSIS — M5442 Lumbago with sciatica, left side: Secondary | ICD-10-CM | POA: Diagnosis not present

## 2023-04-09 DIAGNOSIS — M5432 Sciatica, left side: Secondary | ICD-10-CM

## 2023-04-09 MED ORDER — MELOXICAM 7.5 MG PO TABS
7.5000 mg | ORAL_TABLET | Freq: Every day | ORAL | 0 refills | Status: DC
Start: 2023-04-09 — End: 2023-11-05
  Filled 2023-04-09: qty 30, 30d supply, fill #0

## 2023-04-09 NOTE — Progress Notes (Signed)
Subjective:  Patient ID: Raymond Green, male    DOB: 08-31-1965  Age: 57 y.o. MRN: [id number]  CC:  Chief Complaint  Patient presents with   Sciatica    Left side tingling for 6 months. From hip to the foot he feels the pain     HPI KOBEN ROBIES presents for   Left sided leg pain Noted for the previous 6 months pain in left leg - intermittent - buttock down back of leg to foot. Past month with some tingling sensation, moves from his hip to foot - same area.  No recent injury or change in activity known. No preceding fall.  Some intermittent low back pain at times.   Tx: naprosyn once per day for 1 month. No change.   No bowel or bladder incontinence, no saddle anesthesia, no lower extremity weakness.  No fever/weight loss. Rare night sweat,not soaking sheets.          History Patient Active Problem List   Diagnosis Date Noted   Atherosclerosis of native artery of left lower extremity with intermittent claudication (HCC) 12/19/2022   Old lacunar stroke without late effect 11/29/2022   Chronic rhinosinusitis with multiple nasal polyps 11/29/2022   Chronic rhinitis 11/23/2020   Deviated septum 11/23/2020   Mild coronary artery disease 02/12/2019   Mixed hyperlipidemia 01/14/2019   Angina pectoris (HCC) 01/13/2019   Palpitations 01/13/2019   Low back pain 07/13/2016   Abdominal pain, left lower quadrant 05/17/2016   Elevated serum creatinine 05/29/2015   Gout 10/29/2013   Primary hypertension 03/02/2008   GERD 03/02/2008   Iron deficiency anemia 12/13/2007   External hemorrhoids 12/13/2007   Past Medical History:  Diagnosis Date   Allergy    Anemia    Dysrhythmia    Erosive gastritis 2016   GERD (gastroesophageal reflux disease)    Headache(784.0)    Hemorrhoids    Hyperlipidemia    Hypertension    Kidney insufficiency    LVH (left ventricular hypertrophy)    Dr. Donnie Aho ( cardiology) palpitations   Obesity    OSA (obstructive sleep apnea)     Positive PPD, treated 2000   INH   Recurrent upper respiratory infection (URI)    Sleep apnea    uses cpap   Stroke (HCC)    mimi   Vitamin D deficiency    Past Surgical History:  Procedure Laterality Date   COLONOSCOPY  12/28/2011   Procedure: COLONOSCOPY;  Surgeon: [name], MD;  Location: [facility];  Service: Endoscopy;  Laterality: N/A;   COLONOSCOPY     HEMORRHOID SURGERY     NASAL SINUS SURGERY Bilateral 10/30/2022   Procedure: ENDOSCOPIC NASAL POLYPECTOMY; FRONTAL ETHMOIDECTOMY AND MAXILLARY SINUS SURGERY;  Surgeon: [name], MD;  Location: [facility] OR;  Service: ENT;  Laterality: Bilateral;   UPPER GASTROINTESTINAL ENDOSCOPY     Allergies  Allergen Reactions   Lisinopril     angioedema   Shrimp [Shellfish Allergy] Nausea And Vomiting   Prior to Admission medications   Medication Sig Start Date End Date Taking? Authorizing Provider  amLODipine (NORVASC) 5 MG tablet Take 1 tablet (5 mg total) by mouth daily. 03/29/23  Yes Shade Flood, MD  aspirin EC 81 MG tablet Take 81 mg by mouth daily. Swallow whole.   Yes [provider]  atorvastatin (LIPITOR) 40 MG tablet Take 1 tablet (40 mg) by mouth daily. 01/04/23  Yes Shade Flood, MD  losartan (COZAAR) 100 MG tablet Take 1 tablet (  100 mg total) by mouth daily. 03/29/23  Yes Shade Flood, MD  Nebivolol HCl (BYSTOLIC) 20 MG TABS Take 1 tablet (20 mg total) by mouth daily. 03/29/23  Yes Shade Flood, MD  omeprazole (PRILOSEC) 40 MG capsule Take 1 capsule (40 mg total) by mouth daily. 03/29/23  Yes Shade Flood, MD  spironolactone (ALDACTONE) 25 MG tablet Take 1 tablet (25 mg total) by mouth daily. 09/25/22  Yes   doxycycline (VIBRA-TABS) 100 MG tablet Take 1 tablet (100 mg total) by mouth daily. With travel and for 4 weeks upon return. Patient not taking: Reported on 04/09/2023 11/30/22   Shade Flood, MD   Social History   Socioeconomic History   Marital status: Married    Spouse  name: Not on file   Number of children: 0   Years of education: Not on file   Highest education level: Not on file  Occupational History   Occupation: ENDOSCOPY TECHNICIAN    Employer: [employer]  Tobacco Use   Smoking status: Never   Smokeless tobacco: Never  Vaping Use   Vaping status: Never Used  Substance and Sexual Activity   Alcohol use: Not Currently    Comment: occasional   Drug use: Never   Sexual activity: Yes  Other Topics Concern   Not on file  Social History Narrative   Married   Education: Automotive engineer   Exercise: Yes   Social Determinants of Health   Financial Resource Strain: Not on file  Food Insecurity: Food Insecurity Present (09/02/2022)   Hunger Vital Sign    Worried About Running Out of Food in the Last Year: Sometimes true    Ran Out of Food in the Last Year: Sometimes true  Transportation Needs: No Transportation Needs (09/02/2022)   PRAPARE - Administrator, Civil Service (Medical): No    Lack of Transportation (Non-Medical): No  Physical Activity: Not on file  Stress: Not on file  Social Connections: Not on file  Intimate Partner Violence: Not At Risk (09/02/2022)   Humiliation, Afraid, Rape, and Kick questionnaire    Fear of Current or Ex-Partner: No    Emotionally Abused: No    Physically Abused: No    Sexually Abused: No    Review of Systems   Objective:   Vitals:   04/09/23 1551  BP: 110/74  Pulse: (!) 47  Temp: 97.8 F (36.6 C)  TempSrc: Oral  SpO2: 97%  Weight: 222 lb 6.4 oz (100.9 kg)  Height: 6' (1.829 m)     Physical Exam Vitals reviewed.  Constitutional:      General: He is not in acute distress.    Appearance: Normal appearance. He is well-developed.  HENT:     Head: Normocephalic and atraumatic.  Cardiovascular:     Rate and Rhythm: Normal rate.  Pulmonary:     Effort: Pulmonary effort is normal.  Musculoskeletal:     Comments: Lumbar spine, no focal midline bony tenderness.  Sciatic notch nontender,  SI joint nontender.  Negative seated straight leg raise.  Able to heel and toe walk without difficulty.  Reflex Achilles 1+ on the right, difficult to obtain on left.  Difficult to obtain patellar reflexes bilaterally.  Ambulating without assistive device.  Neurological:     Mental Status: He is alert and oriented to person, place, and time.  Psychiatric:        Mood and Affect: Mood normal.        Assessment & Plan:  AETHAN BARNABA is a 57 y.o. male . Left sided sciatica - Plan: DG Lumbar Spine Complete, meloxicam (MOBIC) 7.5 MG tablet  Left-sided low back pain with left-sided sciatica, unspecified chronicity - Plan: DG Lumbar Spine Complete, meloxicam (MOBIC) 7.5 MG tablet  Symptoms likely sciatica, with some intermittent discomfort past 6 months, recent dysesthesias past month, but those are also intermittent and overall reassuring exam.  No concerning symptoms as above.  But given timing of symptoms will check imaging initially, trial of meloxicam, update on symptoms next week to 10 days, and physical therapy likely next.  RTC precautions.  1 month follow-up.  Meds ordered this encounter  Medications   meloxicam (MOBIC) 7.5 MG tablet    Sig: Take 1 tablet (7.5 mg total) by mouth daily.    Dispense:  30 tablet    Refill:  0   Patient Instructions  Symptoms do sound like sciatica.  Try the anti-inflammatory Mobic once per day, do not combine with naproxen, ibuprofen or other anti-inflammatories over-the-counter.  Tylenol is okay if needed.  See information below.  Please have x-ray at the [facility] facility in the next week.  If any concerns on x-ray I will let you know.  Give me an update in the next week to 10 days.  If not improving we can order physical therapy.  If any new or worsening symptoms be seen, otherwise recheck in 1 month.  Take care!  [facility] or xray: Walk in 8:30-4:30 during weekdays, no appointment needed [address]  [place], [place]  [address]   Sciatica  Sciatica is pain, numbness, weakness, or tingling along the path of the sciatic nerve. The sciatic nerve starts in the lower back and runs down the back of each leg. The nerve controls the muscles in the lower leg and in the back of the knee. It also provides feeling (sensation) to the back of the thigh, the lower leg, and the sole of the foot. Sciatica is a symptom of another medical condition that pinches or puts pressure on the sciatic nerve. Sciatica most often only affects one side of the body. Sciatica usually goes away on its own or with treatment. In some cases, sciatica may come back (recur). What are the causes? This condition is caused by pressure on the sciatic nerve or pinching of the nerve. This may be the result of: A disk in between the bones of the spine bulging out too far (herniated disk). Age-related changes in the spinal disks. A pain disorder that affects a muscle in the buttock. Extra bone growth near the sciatic nerve. A break (fracture) of the pelvis. Pregnancy. Tumor. This is rare. What increases the risk? The following factors may make you more likely to develop this condition: Playing sports that place pressure or stress on the spine. Having poor strength and flexibility. A history of back injury or surgery. Sitting for long periods of time. Doing activities that involve repetitive bending or lifting. Obesity. What are the signs or symptoms? Symptoms can vary from mild to very severe. They may include: Any of the following problems in the lower back, leg, hip, or buttock: Mild tingling, numbness, or dull aches. Burning sensations. Sharp pains. Numbness in the back of the calf or the sole of the foot. Leg weakness. Severe back pain that makes movement difficult. Symptoms may get worse when you cough, sneeze, or laugh, or when you sit or stand for long periods of time. How is this diagnosed? This condition  may be diagnosed based on: Your  symptoms and medical history. A physical exam. Blood tests. Imaging tests, such as: X-rays. An MRI. A CT scan. How is this treated? In many cases, this condition improves on its own without treatment. However, treatment may include: Reducing or modifying physical activity. Exercising, including strengthening and stretching. Icing and applying heat to the affected area. Medicines that help to: Relieve pain and swelling. Relax your muscles. Injections of medicines that help to relieve pain and inflammation (steroids) around the sciatic nerve. Surgery. Follow these instructions at home: Medicines Take over-the-counter and prescription medicines only as told by your health care provider. Ask your health care provider if the medicine prescribed to you requires you to avoid driving or using heavy machinery. Managing pain     If directed, put ice on the affected area. To do this: Put ice in a plastic bag. Place a towel between your skin and the bag. Leave the ice on for 20 minutes, 2-3 times a day. If your skin turns bright red, remove the ice right away to prevent skin damage. The risk of skin damage is higher if you cannot feel pain, heat, or cold. If directed, apply heat to the affected area as often as told by your health care provider. Use the heat source that your health care provider recommends, such as a moist heat pack or a heating pad. Place a towel between your skin and the heat source. Leave the heat on for 20-30 minutes. If your skin turns bright red, remove the heat right away to prevent burns. The risk of burns is higher if you cannot feel pain, heat, or cold. Activity  Return to your normal activities as told by your health care provider. Ask your health care provider what activities are safe for you. Avoid activities that make your symptoms worse. Take brief periods of rest throughout the day. When you rest for longer periods, mix in some mild activity or stretching  between periods of rest. This will help to prevent stiffness and pain. Avoid sitting for long periods of time without moving. Get up and move around at least one time each hour. Exercise and stretch regularly as told by your health care provider. Do not lift anything that is heavier than 10 lb (4.5 kg) until your health care provider says that it is safe. When you do not have symptoms, you should still avoid heavy lifting, especially repetitive heavy lifting. When you lift objects, always use proper lifting technique, which includes: Bending your knees. Keeping the load close to your body. Avoiding twisting. General instructions Maintain a healthy weight. Excess weight puts extra stress on your back. Wear supportive, comfortable shoes. Avoid wearing high heels. Avoid sleeping on a mattress that is too soft or too hard. A mattress that is firm enough to support your back when you sleep may help to reduce your pain. Contact a health care provider if: Your pain is not controlled by medicine. Your pain does not improve or gets worse. Your pain lasts longer than 4 weeks. You have unexplained weight loss. Get help right away if: You are not able to control when you urinate or have bowel movements (incontinence). You have: Weakness in your lower back, pelvis, buttocks, or legs that gets worse. Redness or swelling of your back. A burning sensation when you urinate. Summary Sciatica is pain, numbness, weakness, or tingling along the path of the sciatic nerve, which may include the lower back, legs, hips, and buttocks. This condition  is caused by pressure on the sciatic nerve or pinching of the nerve. Treatment often includes rest, exercise, medicines, and applying ice or heat. This information is not intended to replace advice given to you by your health care provider. Make sure you discuss any questions you have with your health care provider. Document Revised: 09/12/2021 Document Reviewed:  09/12/2021 Elsevier Patient Education  2024 Elsevier Inc.     Signed,   [name], MD [facility], [facility] 04/09/23 4:46 PM

## 2023-04-09 NOTE — Patient Instructions (Addendum)
Symptoms do sound like sciatica.  Try the anti-inflammatory Mobic once per day, do not combine with naproxen, ibuprofen or other anti-inflammatories over-the-counter.  Tylenol is okay if needed.  See information below.  Please have x-ray at the [facility] facility in the next week.  If any concerns on x-ray I will let you know.  Give me an update in the next week to 10 days.  If not improving we can order physical therapy.  If any new or worsening symptoms be seen, otherwise recheck in 1 month.  Take care!  [facility] or xray: Walk in 8:30-4:30 during weekdays, no appointment needed [address]  [place], [place] [address]   Sciatica  Sciatica is pain, numbness, weakness, or tingling along the path of the sciatic nerve. The sciatic nerve starts in the lower back and runs down the back of each leg. The nerve controls the muscles in the lower leg and in the back of the knee. It also provides feeling (sensation) to the back of the thigh, the lower leg, and the sole of the foot. Sciatica is a symptom of another medical condition that pinches or puts pressure on the sciatic nerve. Sciatica most often only affects one side of the body. Sciatica usually goes away on its own or with treatment. In some cases, sciatica may come back (recur). What are the causes? This condition is caused by pressure on the sciatic nerve or pinching of the nerve. This may be the result of: A disk in between the bones of the spine bulging out too far (herniated disk). Age-related changes in the spinal disks. A pain disorder that affects a muscle in the buttock. Extra bone growth near the sciatic nerve. A break (fracture) of the pelvis. Pregnancy. Tumor. This is rare. What increases the risk? The following factors may make you more likely to develop this condition: Playing sports that place pressure or stress on the spine. Having poor strength and flexibility. A history of back injury or surgery. Sitting for long periods of  time. Doing activities that involve repetitive bending or lifting. Obesity. What are the signs or symptoms? Symptoms can vary from mild to very severe. They may include: Any of the following problems in the lower back, leg, hip, or buttock: Mild tingling, numbness, or dull aches. Burning sensations. Sharp pains. Numbness in the back of the calf or the sole of the foot. Leg weakness. Severe back pain that makes movement difficult. Symptoms may get worse when you cough, sneeze, or laugh, or when you sit or stand for long periods of time. How is this diagnosed? This condition may be diagnosed based on: Your symptoms and medical history. A physical exam. Blood tests. Imaging tests, such as: X-rays. An MRI. A CT scan. How is this treated? In many cases, this condition improves on its own without treatment. However, treatment may include: Reducing or modifying physical activity. Exercising, including strengthening and stretching. Icing and applying heat to the affected area. Medicines that help to: Relieve pain and swelling. Relax your muscles. Injections of medicines that help to relieve pain and inflammation (steroids) around the sciatic nerve. Surgery. Follow these instructions at home: Medicines Take over-the-counter and prescription medicines only as told by your health care provider. Ask your health care provider if the medicine prescribed to you requires you to avoid driving or using heavy machinery. Managing pain     If directed, put ice on the affected area. To do this: Put ice in a plastic bag. Place  a towel between your skin and the bag. Leave the ice on for 20 minutes, 2-3 times a day. If your skin turns bright red, remove the ice right away to prevent skin damage. The risk of skin damage is higher if you cannot feel pain, heat, or cold. If directed, apply heat to the affected area as often as told by your health care provider. Use the heat source that your health  care provider recommends, such as a moist heat pack or a heating pad. Place a towel between your skin and the heat source. Leave the heat on for 20-30 minutes. If your skin turns bright red, remove the heat right away to prevent burns. The risk of burns is higher if you cannot feel pain, heat, or cold. Activity  Return to your normal activities as told by your health care provider. Ask your health care provider what activities are safe for you. Avoid activities that make your symptoms worse. Take brief periods of rest throughout the day. When you rest for longer periods, mix in some mild activity or stretching between periods of rest. This will help to prevent stiffness and pain. Avoid sitting for long periods of time without moving. Get up and move around at least one time each hour. Exercise and stretch regularly as told by your health care provider. Do not lift anything that is heavier than 10 lb (4.5 kg) until your health care provider says that it is safe. When you do not have symptoms, you should still avoid heavy lifting, especially repetitive heavy lifting. When you lift objects, always use proper lifting technique, which includes: Bending your knees. Keeping the load close to your body. Avoiding twisting. General instructions Maintain a healthy weight. Excess weight puts extra stress on your back. Wear supportive, comfortable shoes. Avoid wearing high heels. Avoid sleeping on a mattress that is too soft or too hard. A mattress that is firm enough to support your back when you sleep may help to reduce your pain. Contact a health care provider if: Your pain is not controlled by medicine. Your pain does not improve or gets worse. Your pain lasts longer than 4 weeks. You have unexplained weight loss. Get help right away if: You are not able to control when you urinate or have bowel movements (incontinence). You have: Weakness in your lower back, pelvis, buttocks, or legs that gets  worse. Redness or swelling of your back. A burning sensation when you urinate. Summary Sciatica is pain, numbness, weakness, or tingling along the path of the sciatic nerve, which may include the lower back, legs, hips, and buttocks. This condition is caused by pressure on the sciatic nerve or pinching of the nerve. Treatment often includes rest, exercise, medicines, and applying ice or heat. This information is not intended to replace advice given to you by your health care provider. Make sure you discuss any questions you have with your health care provider. Document Revised: 09/12/2021 Document Reviewed: 09/12/2021 Elsevier Patient Education  2024 [employer].

## 2023-04-10 ENCOUNTER — Other Ambulatory Visit (HOSPITAL_COMMUNITY): Payer: Self-pay

## 2023-04-17 DIAGNOSIS — G4733 Obstructive sleep apnea (adult) (pediatric): Secondary | ICD-10-CM | POA: Diagnosis not present

## 2023-05-08 DIAGNOSIS — G4733 Obstructive sleep apnea (adult) (pediatric): Secondary | ICD-10-CM | POA: Diagnosis not present

## 2023-05-10 ENCOUNTER — Ambulatory Visit: Payer: 59 | Admitting: Family Medicine

## 2023-05-10 ENCOUNTER — Encounter: Payer: Self-pay | Admitting: Family Medicine

## 2023-05-10 VITALS — BP 124/68 | HR 58 | Temp 97.9°F | Wt 230.0 lb

## 2023-05-10 DIAGNOSIS — Z8673 Personal history of transient ischemic attack (TIA), and cerebral infarction without residual deficits: Secondary | ICD-10-CM

## 2023-05-10 DIAGNOSIS — E785 Hyperlipidemia, unspecified: Secondary | ICD-10-CM

## 2023-05-10 DIAGNOSIS — R39198 Other difficulties with micturition: Secondary | ICD-10-CM | POA: Diagnosis not present

## 2023-05-10 DIAGNOSIS — R7989 Other specified abnormal findings of blood chemistry: Secondary | ICD-10-CM | POA: Diagnosis not present

## 2023-05-10 DIAGNOSIS — I10 Essential (primary) hypertension: Secondary | ICD-10-CM

## 2023-05-10 DIAGNOSIS — M5432 Sciatica, left side: Secondary | ICD-10-CM | POA: Diagnosis not present

## 2023-05-10 DIAGNOSIS — R82998 Other abnormal findings in urine: Secondary | ICD-10-CM | POA: Diagnosis not present

## 2023-05-10 MED ORDER — ATORVASTATIN CALCIUM 40 MG PO TABS
40.0000 mg | ORAL_TABLET | Freq: Every day | ORAL | 0 refills | Status: DC
  Filled 2023-05-10 – 2023-07-13 (×3): qty 90, 90d supply, fill #0

## 2023-05-10 NOTE — Patient Instructions (Addendum)
Glad to hear that the back pain, and sciatica has improved.  Okay to continue meloxicam temporarily, if pain does not continue to improve or worsens off that med, please have x-ray performed and let me know.  Would consider physical therapy or orthopedic evaluation.  No med changes at this time.  Please have labs performed at the [facility] location within the next 1 week.  I did order a urine test as well as prostate test to evaluate for the nighttime urinary symptoms as well as the foamy urine but please be seen if any new or worsening symptoms.

## 2023-05-10 NOTE — Progress Notes (Signed)
Subjective:  Patient ID: [name], male    DOB: 1966-05-22  Age: 57 y.o. MRN: [id number]  CC:  Chief Complaint  Patient presents with   Medical Management of Chronic Issues    Pt doing okay pain in leg seems to be better not resolved    HPI ALICIA SALASAR presents for follow up.   Left low back pain with left-sided sciatica Evaluated October 21.  Lumbar spine x-ray ordered.  Started on meloxicam 7.5 mg daily.  Option of physical therapy depending on symptom improvement. Taking mobic daily. Improved. Intermittent symptoms, getting better.   Hypertension: With history of MCA right sided arterial ischemic stroke, right corona radiata noted on CT in March.  Followed by neurology.  On aspirin.  Hypertension controlled plan for prior lacunar stroke noted by neuro. Treated with losartan 100 mg daily, amlodipine 5 mg daily, spironolactone 25 mg daily, bystolic 20mg  every day.   Still on aspirin 81 mg daily. Home readings:no recent readings.  No med side effects form meds.  No new HA or weakness.  BP Readings from Last 3 Encounters:  05/10/23 124/68  04/09/23 110/74  12/19/22 113/73   Lab Results  Component Value Date   CREATININE 1.30 (H) 10/30/2022    Foam with urination past month. No burning with urination. No dysuria. Weak stream at times in middle of night past month. No change in frequency. No difficulty with stream during the day.   Lab Results  Component Value Date   PSA1 0.3 12/11/2017   PSA 0.26 01/04/2022   PSA 0.37 09/01/2015   PSA 0.27 12/26/2013   Hyperlipidemia: Lipitor 40 mg daily.  Secondary prevention as above with history of lacunar stroke. No new se's or myalgias.  Lab Results  Component Value Date   CHOL 173 09/18/2022   HDL 61.00 09/18/2022   LDLCALC 89 09/18/2022   TRIG 115.0 09/18/2022   CHOLHDL 3 09/18/2022   Lab Results  Component Value Date   ALT 12 09/18/2022   AST 18 09/18/2022   ALKPHOS 43 09/18/2022   BILITOT 0.9  09/18/2022   Plan for sinus surgery - Dr. Vanessa Kick in January for deviated septum.      History Patient Active Problem List   Diagnosis Date Noted   Atherosclerosis of native artery of left lower extremity with intermittent claudication (HCC) 12/19/2022   Old lacunar stroke without late effect 11/29/2022   Chronic rhinosinusitis with multiple nasal polyps 11/29/2022   Chronic rhinitis 11/23/2020   Deviated septum 11/23/2020   Mild coronary artery disease 02/12/2019   Mixed hyperlipidemia 01/14/2019   Angina pectoris (HCC) 01/13/2019   Palpitations 01/13/2019   Low back pain 07/13/2016   Abdominal pain, left lower quadrant 05/17/2016   Elevated serum creatinine 05/29/2015   Gout 10/29/2013   Primary hypertension 03/02/2008   GERD 03/02/2008   Iron deficiency anemia 12/13/2007   External hemorrhoids 12/13/2007   Past Medical History:  Diagnosis Date   Allergy    Anemia    Dysrhythmia    Erosive gastritis 2016   GERD (gastroesophageal reflux disease)    Headache(784.0)    Hemorrhoids    Hyperlipidemia    Hypertension    Kidney insufficiency    LVH (left ventricular hypertrophy)    Dr. Donnie Aho ( cardiology) palpitations   Obesity    OSA (obstructive sleep apnea)    Positive PPD, treated 2000   INH   Recurrent upper respiratory infection (URI)    Sleep apnea  uses cpap   Stroke [facility])    mimi   Vitamin D deficiency    Past Surgical History:  Procedure Laterality Date   COLONOSCOPY  12/28/2011   Procedure: COLONOSCOPY;  Surgeon: [name], MD;  Location: [facility];  Service: Endoscopy;  Laterality: N/A;   COLONOSCOPY     HEMORRHOID SURGERY     NASAL SINUS SURGERY Bilateral 10/30/2022   Procedure: ENDOSCOPIC NASAL POLYPECTOMY; FRONTAL ETHMOIDECTOMY AND MAXILLARY SINUS SURGERY;  Surgeon: [name], MD;  Location: [facility] OR;  Service: ENT;  Laterality: Bilateral;   UPPER GASTROINTESTINAL ENDOSCOPY     Allergies  Allergen Reactions   Lisinopril      angioedema   Shrimp [Shellfish Allergy] Nausea And Vomiting   Prior to Admission medications   Medication Sig Start Date End Date Taking? Authorizing Provider  amLODipine (NORVASC) 5 MG tablet Take 1 tablet (5 mg total) by mouth daily. 03/29/23  Yes Shade Flood, MD  aspirin EC 81 MG tablet Take 81 mg by mouth daily. Swallow whole.   Yes [provider]  atorvastatin (LIPITOR) 40 MG tablet Take 1 tablet (40 mg) by mouth daily. 01/04/23  Yes Shade Flood, MD  losartan (COZAAR) 100 MG tablet Take 1 tablet (100 mg total) by mouth daily. 03/29/23  Yes Shade Flood, MD  meloxicam (MOBIC) 7.5 MG tablet Take 1 tablet (7.5 mg total) by mouth daily. 04/09/23  Yes Shade Flood, MD  Nebivolol HCl (BYSTOLIC) 20 MG TABS Take 1 tablet (20 mg total) by mouth daily. 03/29/23  Yes Shade Flood, MD  omeprazole (PRILOSEC) 40 MG capsule Take 1 capsule (40 mg total) by mouth daily. 03/29/23  Yes Shade Flood, MD  spironolactone (ALDACTONE) 25 MG tablet Take 1 tablet (25 mg total) by mouth daily. 09/25/22  Yes   doxycycline (VIBRA-TABS) 100 MG tablet Take 1 tablet (100 mg total) by mouth daily. With travel and for 4 weeks upon return. Patient not taking: Reported on 04/09/2023 11/30/22   Shade Flood, MD   Social History   Socioeconomic History   Marital status: Married    Spouse name: Not on file   Number of children: 0   Years of education: Not on file   Highest education level: Not on file  Occupational History   Occupation: ENDOSCOPY TECHNICIAN    Employer: [employer]  Tobacco Use   Smoking status: Never   Smokeless tobacco: Never  Vaping Use   Vaping status: Never Used  Substance and Sexual Activity   Alcohol use: Not Currently    Comment: occasional   Drug use: Never   Sexual activity: Yes  Other Topics Concern   Not on file  Social History Narrative   Married   Education: Automotive engineer   Exercise: Yes   Social Determinants of Health   Financial  Resource Strain: Not on file  Food Insecurity: Food Insecurity Present (09/02/2022)   Hunger Vital Sign    Worried About Running Out of Food in the Last Year: Sometimes true    Ran Out of Food in the Last Year: Sometimes true  Transportation Needs: No Transportation Needs (09/02/2022)   PRAPARE - Administrator, Civil Service (Medical): No    Lack of Transportation (Non-Medical): No  Physical Activity: Not on file  Stress: Not on file  Social Connections: Not on file  Intimate Partner Violence: Not At Risk (09/02/2022)   Humiliation, Afraid, Rape, and Kick questionnaire    Fear of Current  or Ex-Partner: No    Emotionally Abused: No    Physically Abused: No    Sexually Abused: No    Review of Systems  Constitutional:  Negative for fatigue and unexpected weight change.  Eyes:  Negative for visual disturbance.  Respiratory:  Negative for cough, chest tightness and shortness of breath.   Cardiovascular:  Negative for chest pain, palpitations and leg swelling.  Gastrointestinal:  Negative for abdominal pain and blood in stool.  Neurological:  Negative for dizziness, light-headedness and headaches.     Objective:   Vitals:   05/10/23 1715  BP: 124/68  Pulse: (!) 58  Temp: 97.9 F (36.6 C)  SpO2: 98%  Weight: 230 lb (104.3 kg)     Physical Exam Vitals reviewed.  Constitutional:      Appearance: He is well-developed.  HENT:     Head: Normocephalic and atraumatic.  Neck:     Vascular: No carotid bruit or JVD.  Cardiovascular:     Rate and Rhythm: Normal rate and regular rhythm.     Heart sounds: Normal heart sounds. No murmur heard. Pulmonary:     Effort: Pulmonary effort is normal.     Breath sounds: Normal breath sounds. No rales.  Musculoskeletal:     Right lower leg: No edema.     Left lower leg: No edema.  Skin:    General: Skin is warm and dry.  Neurological:     Mental Status: He is alert and oriented to person, place, and time.  Psychiatric:         Mood and Affect: Mood normal.        Assessment & Plan:  NEKIA NAVEJAS is a 57 y.o. male . Left sided sciatica  -Improving, continue Mobic short-term, RTC precautions.  Imaging if symptoms do not continue to improve with possible PT or Ortho eval.  He will let me know.  Primary hypertension History of cerebral infarction Benign essential HTN  -Stable, continue same regimen.  Secondary prevention with prior lacunar infarct.  Continue aspirin.  Denies any new bleeding.  Hyperlipidemia, unspecified hyperlipidemia type - Plan: Comprehensive metabolic panel, Lipid panel  -Tolerating current regimen, check labs and adjust plan accordingly.  Foamy urine - Plan: Comprehensive metabolic panel, Microalbumin / creatinine urine ratio, POCT urinalysis dipstick Elevated serum creatinine - Plan: Comprehensive metabolic panel  -Repeat creatinine on CMP, check urinalysis for proteinuria as well as microalbumin ratio.  Abnormal urinary stream - Plan: PSA  -Nighttime change in urinary stream, not noted during the day.  Check PSA, urine testing as above, RTC precautions.  Meds ordered this encounter  Medications   atorvastatin (LIPITOR) 40 MG tablet    Sig: Take 1 tablet (40 mg) by mouth daily.    Dispense:  90 tablet    Refill:  0   Patient Instructions  Glad to hear that the back pain, and sciatica has improved.  Okay to continue meloxicam temporarily, if pain does not continue to improve or worsens off that med, please have x-ray performed and let me know.  Would consider physical therapy or orthopedic evaluation.  No med changes at this time.  Please have labs performed at the [facility] location within the next 1 week.  I did order a urine test as well as prostate test to evaluate for the nighttime urinary symptoms as well as the foamy urine but please be seen if any new or worsening symptoms.      Signed,   [name], MD [place]  Primary [facility], [facility] 05/10/23 5:17 PM

## 2023-05-11 ENCOUNTER — Other Ambulatory Visit (HOSPITAL_COMMUNITY): Payer: Self-pay

## 2023-05-14 ENCOUNTER — Other Ambulatory Visit (INDEPENDENT_AMBULATORY_CARE_PROVIDER_SITE_OTHER): Payer: 59

## 2023-05-14 DIAGNOSIS — R82998 Other abnormal findings in urine: Secondary | ICD-10-CM | POA: Diagnosis not present

## 2023-05-14 DIAGNOSIS — R7989 Other specified abnormal findings of blood chemistry: Secondary | ICD-10-CM | POA: Diagnosis not present

## 2023-05-14 DIAGNOSIS — E785 Hyperlipidemia, unspecified: Secondary | ICD-10-CM | POA: Diagnosis not present

## 2023-05-14 DIAGNOSIS — R39198 Other difficulties with micturition: Secondary | ICD-10-CM

## 2023-05-14 LAB — PSA: PSA: 1.18 ng/mL (ref 0.10–4.00)

## 2023-05-15 LAB — LIPID PANEL
Cholesterol: 120 mg/dL (ref 0–200)
HDL: 42.6 mg/dL (ref >39.00)
LDL Cholesterol: 60 mg/dL (ref 0–99)
NonHDL: 77.48
Total CHOL/HDL Ratio: 3
Triglycerides: 85 mg/dL (ref 0.0–149.0)
VLDL: 17 mg/dL (ref 0.0–40.0)

## 2023-05-15 LAB — COMPREHENSIVE METABOLIC PANEL
ALT: 18 U/L (ref 0–53)
AST: 21 U/L (ref 0–37)
Albumin: 4.6 g/dL (ref 3.5–5.2)
Alkaline Phosphatase: 65 U/L (ref 39–117)
BUN: 19 mg/dL (ref 6–23)
CO2: 26 meq/L (ref 19–32)
Calcium: 10.1 mg/dL (ref 8.4–10.5)
Chloride: 104 meq/L (ref 96–112)
Creatinine, Ser: 1.54 mg/dL — ABNORMAL HIGH (ref 0.40–1.50)
GFR: 49.93 mL/min — ABNORMAL LOW (ref >60.00)
Glucose, Bld: 94 mg/dL (ref 70–99)
Potassium: 4.3 meq/L (ref 3.5–5.1)
Sodium: 142 meq/L (ref 135–145)
Total Bilirubin: 0.7 mg/dL (ref 0.2–1.2)
Total Protein: 7.4 g/dL (ref 6.0–8.3)

## 2023-05-15 LAB — MICROALBUMIN / CREATININE URINE RATIO
Creatinine,U: 101.2 mg/dL
Microalb Creat Ratio: 0.7 mg/g (ref 0.0–30.0)
Microalb, Ur: <0.7 mg/dL (ref 0.0–1.9)

## 2023-05-18 DIAGNOSIS — G4733 Obstructive sleep apnea (adult) (pediatric): Secondary | ICD-10-CM | POA: Diagnosis not present

## 2023-05-22 ENCOUNTER — Other Ambulatory Visit: Payer: Self-pay

## 2023-05-22 ENCOUNTER — Other Ambulatory Visit (HOSPITAL_COMMUNITY): Payer: Self-pay

## 2023-05-22 ENCOUNTER — Encounter: Payer: Self-pay | Admitting: Neurology

## 2023-05-22 ENCOUNTER — Ambulatory Visit (INDEPENDENT_AMBULATORY_CARE_PROVIDER_SITE_OTHER): Payer: 59 | Admitting: Neurology

## 2023-05-22 VITALS — BP 122/69 | HR 50 | Ht 72.0 in | Wt 226.0 lb

## 2023-05-22 DIAGNOSIS — R002 Palpitations: Secondary | ICD-10-CM

## 2023-05-22 DIAGNOSIS — G4733 Obstructive sleep apnea (adult) (pediatric): Secondary | ICD-10-CM

## 2023-05-22 DIAGNOSIS — I70212 Atherosclerosis of native arteries of extremities with intermittent claudication, left leg: Secondary | ICD-10-CM

## 2023-05-22 DIAGNOSIS — J342 Deviated nasal septum: Secondary | ICD-10-CM | POA: Diagnosis not present

## 2023-05-22 MED ORDER — ZOLPIDEM TARTRATE 5 MG PO TABS
5.0000 mg | ORAL_TABLET | Freq: Every evening | ORAL | 0 refills | Status: DC | PRN
  Filled 2023-05-22: qty 30, 30d supply, fill #0

## 2023-05-22 NOTE — Patient Instructions (Addendum)
Zolpidem Tablets What is this medication? ZOLPIDEM (zole PI dem) treats insomnia. It helps you get to sleep faster and stay asleep throughout the night. It is often used for a short period of time. This medicine may be used for other purposes; ask your health care provider or pharmacist if you have questions. COMMON BRAND NAME(S): Ambien What should I tell my care team before I take this medication? They need to know if you have any of these conditions: Depression Frequently drink alcohol Liver disease Lung or breathing disease Myasthenia gravis Sleep apnea Substance use disorder Suicidal thoughts, plans, or attempt by you or a family member Unusual sleep behaviors or activities you do not remember An unusual or allergic reaction to zolpidem, other medications, foods, dyes, or preservatives Pregnant or trying to get pregnant Breastfeeding How should I use this medication? Take this medication by mouth with water. Take it as directed on the prescription label. It is better to take this medication on an empty stomach and only when you are ready for bed. Do not take your medication more often than directed. If you have been taking this medication for several weeks and suddenly stop taking it, you may get unpleasant withdrawal symptoms. Your care team may want to gradually reduce the dose. Do not stop taking this medication on your own. Always follow your care team's advice. A special MedGuide will be given to you by the pharmacist with each prescription and refill. Be sure to read this information carefully each time. Talk to your care team about the use of this medication in children. Special care may be needed. Overdosage: If you think you have taken too much of this medicine contact a poison control center or emergency room at once. NOTE: This medicine is only for you. Do not share this medicine with others. What if I miss a dose? This does not apply. This medication should only be  taken immediately before going to sleep. Do not take double or extra doses. What may interact with this medication? Alcohol Antihistamines for allergy, cough, and cold Certain medications for anxiety or sleep Certain medications for depression, such as amitriptyline, fluoxetine, sertraline Certain medications for fungal infections, such as ketoconazole and itraconazole Certain medications for seizures, such as phenobarbital, primidone Ciprofloxacin Dietary supplements for sleep, such as valerian or kava kava General anesthetics, such as halothane, isoflurane, methoxyflurane, propofol Local anesthetics, such as lidocaine, pramoxine, tetracaine Medications that relax muscles for surgery Opioid medications for pain Phenothiazines, such as chlorpromazine, mesoridazine, prochlorperazine, thioridazine Rifampin This list may not describe all possible interactions. Give your health care provider a list of all the medicines, herbs, non-prescription drugs, or dietary supplements you use. Also tell them if you smoke, drink alcohol, or use illegal drugs. Some items may interact with your medicine. What should I watch for while using this medication? Visit your care team for regular checks on your progress. Keep a regular sleep schedule by going to bed at about the same time each night. Avoid caffeine-containing drinks in the evening hours. When sleep medications are used every night for more than a few weeks, they may stop working. Talk to your care team if you still have trouble sleeping. You may do unusual sleep behaviors or activities you do not remember the day after taking this medication. Activities include driving, making or eating food, talking on the phone, sexual activity, or sleep walking. Stop taking this medication and call your care team right away if you find out you have done  activities like this. Plan to go to bed and stay in bed for a full night (7 to 8 hours) after you take this  medication. You may still be drowsy the morning after taking this medication. This medication may affect your coordination, reaction time, or judgment. Do not drive or operate machinery until you know how this medication affects you. Sit up or stand slowly to reduce the risk of dizzy or fainting spells. If you or your family notice any changes in your behavior, such as new or worsening depression, thoughts of harming yourself, anxiety, other unusual or disturbing thoughts, or memory loss, call your care team right away. After you stop taking this medication, you may have trouble falling asleep. This is called rebound insomnia. This problem usually goes away on its own after 1 or 2 nights. What side effects may I notice from receiving this medication? Side effects that you should report to your care team as soon as possible: Allergic reactions--skin rash, itching, hives, swelling of the face, lips, tongue, or throat Change in vision such as blurry vision, seeing halos around lights, vision loss CNS depression--slow or shallow breathing, shortness of breath, feeling faint, dizziness, confusion, difficulty staying awake Mood and behavior changes--anxiety, nervousness, confusion, hallucinations, irritability, hostility, thoughts of suicide or self-harm, worsening mood, feelings of depression Unusual sleep behaviors or activities you do not remember such as driving, eating, or sexual activity Side effects that usually do not require medical attention (report to your care team if they continue or are bothersome): Diarrhea Dizziness Drowsiness the day after use Headache This list may not describe all possible side effects. Call your doctor for medical advice about side effects. You may report side effects to FDA at 1-800-FDA-1088. Where should I keep my medication? Keep out of the reach of children and pets. This medication can be abused. Keep your medication in a safe place to protect it from theft. Do  not share this medication with anyone. Selling or giving away this medication is dangerous and against the law. Store at room temperature between 20 and 25 degrees C (68 and 77 degrees F). This medication may cause accidental overdose and death if taken by other adults, children, or pets. Mix any unused medication with a substance like cat litter or coffee grounds. Then throw the medication away in a sealed container like a sealed bag or a coffee can with a lid. Do not use the medication after the expiration date. NOTE: This sheet is a summary. It may not cover all possible information. If you have questions about this medicine, talk to your doctor, pharmacist, or health care provider.  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   2) the patient wakes up and starts to worry, he may wake up for a bathroom break, usually not due to snoring,  choking or coughing.    3)  we discussed sleep modifications; keep the bed room cool, quiet and dark.  No caffeine in the last 4 hours of the day. Restrict hydration 2-3 hrs before bed time.   Consider non -vocal soft soothing music in the background to help relax, consider an audio book read by a pleasant voice.   Continue using CPAP as prescribed.   I will order a sleep aid  that can be used prn, more of a mind calming presence of an aid if needed. Ambien generic 5 mg, taken prn with at least 6 hours of sleep time ahead.    I plan to follow up through our NP within 12 months.

## 2023-05-22 NOTE — Progress Notes (Signed)
Provider:  Melvyn Novas, MD  Primary Care Physician:  Shade Flood, MD [address] [place] [address]     Referring Provider: Shade Flood, Md [address] [place],  [place] [address]          Chief Complaint according to patient   Patient presents with:          Stroke patient referred for CPAP       HISTORY OF PRESENT ILLNESS:  Raymond Green is a 57 y.o. male patient and  worker, who is here for revisit 05/22/2023 for severe OSA per HST from 10-2022. Here for follow up on CPAP> .  Chief concern according to patient :  "I have no trouble with CPAP, but I wake up and don't easily go back to sleep. " Averaging 6 hours of sleep, with 1-2 interruptions. Going to the bathroom. I worry about my health, my mind is busy "  Raymond Green endorses the Epworth sleepiness score at 10 points and the fatigue severity scale at 12 out of 63 points which is low.  His sleepiness is about average.  His medications are still Aldactone, Prilosec, and he believes all, Mobic, atorvastatin, losartan, aspirin enterically coated baby size 81 mg, and Norvasc.  Over a period of a 90-day observation the patient used his CPAP for 78% and 71% compliance 4 hours.  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  His average use of time on days used is 6 hours 8 minutes.  The minimum pressure is set at 5 the maximum at 15 cm water with 3 cm expiratory relief.  The residual AHI is 3.2/h the residual consists mostly of obstructive events and very few centrals.  There are also few hypopneas among those events.  The air leak at the 95th percentile is 3.9 L/min and therefore low the 95th percentile pressure is 10 cm water.  So from the settings I think he is at the [place] middle.  There is neither aerophagia nor gasping reported. The problem arises when he wakes up out of sleep and sometimes has trouble to go back to it that can happen several times at night.      Originally :  REFERRING CLINICIAN: Dr Neva Seat, MD / cc Dr Pollyann Kennedy.    CLINICAL INFORMATION/HISTORY: [id number]; patent referred as a STROKE patient, needing OR clearance. He presented to the Ed with left hand weakness , this symptom had been present on and off for several month. Remote stroke was detected on CT head from 09-08-2022, localized to  the Right corona radiata. The patient went to urgent care in March with an elevated blood pressure from there he was referred back to his primary care physician and then reported that he had sometimes left hand grip strength problems a CT of the head on 3-22 confirmed the presence of a remote stroke.  Due to this finding he was unable to undergo polypectomy with ENT.  He is awaiting surgical clearance from [place].  He reports chronic rhinosinusitis, primary hypertension and has had sleep apnea on CPAP .   The patient has had meanwhile surgery ENT with Dr Pollyann Kennedy. Raymond Frizzle, MD - 03/20/2023 1:20 PM EDT Formatting of this note might be different from the original. Return visit. He is doing well overall but still having some nasal congestion on the right side at nighttime in particular. On examination there  is rightward septal deviation causing partial obstruction. The mucosa all looks healthy and there is no evidence of polyps or exudate. He would likely benefit with nasal septoplasty. Continue working with his allergist. He is interested in scheduling this. Risks and benefits were discussed.     Review of Systems: Out of a complete 14 system review, the patient complains of only the following symptoms, and all other reviewed systems are negative.:  Fatigue, sleepiness , snoring, fragmented sleep, Insomnia, RLS, Nocturia- 2-3 times still.   Had just ENT surgery.    How likely are you to doze in the following situations: 0 = not likely, 1 = slight chance, 2 = moderate chance, 3 = high chance   Sitting and Reading? Watching Television? Sitting inactive in a  public place (theater or meeting)? As a passenger in a car for an hour without a break? Lying down in the afternoon when circumstances permit? Sitting and talking to someone? Sitting quietly after lunch without alcohol? In a car, while stopped for a few minutes in traffic?   Total = 10/ 24 points   FSS endorsed at 15/ 63 points.  Insomnia due to mind being busy.   Social History   Socioeconomic History   Marital status: Married    Spouse name: Not on file   Number of children: 0   Years of education: Not on file   Highest education level: Not on file  Occupational History   Occupation: ENDOSCOPY TECHNICIAN    Employer: [employer]  Tobacco Use   Smoking status: Never   Smokeless tobacco: Never  Vaping Use   Vaping status: Never Used  Substance and Sexual Activity   Alcohol use: Not Currently    Comment: occasional   Drug use: Never   Sexual activity: Yes  Other Topics Concern   Not on file  Social History Narrative   Married   Education: Automotive engineer   Exercise: Yes   Social Determinants of Health   Financial Resource Strain: Not on file  Food Insecurity: Food Insecurity Present (09/02/2022)   Hunger Vital Sign    Worried About Running Out of Food in the Last Year: Sometimes true    Ran Out of Food in the Last Year: Sometimes true  Transportation Needs: No Transportation Needs (09/02/2022)   PRAPARE - Administrator, Civil Service (Medical): No    Lack of Transportation (Non-Medical): No  Physical Activity: Not on file  Stress: Not on file  Social Connections: Not on file    Family History  Problem Relation Age of Onset   Diabetes Mother    Heart disease Mother    Hypertension Mother    Hypertension Father    Diabetes Sister    Colon cancer Neg Hx    Colon polyps Neg Hx    Esophageal cancer Neg Hx    Rectal cancer Neg Hx    Stomach cancer Neg Hx    Inflammatory bowel disease Neg Hx    Liver disease Neg Hx    Pancreatic cancer Neg Hx     Past  Medical History:  Diagnosis Date   Allergy    Anemia    Dysrhythmia    Erosive gastritis 2016   GERD (gastroesophageal reflux disease)    Headache(784.0)    Hemorrhoids    Hyperlipidemia    Hypertension    Kidney insufficiency    LVH (left ventricular hypertrophy)    Dr. Donnie Aho ( cardiology) palpitations   Obesity    OSA (  obstructive sleep apnea)    Positive PPD, treated 2000   INH   Recurrent upper respiratory infection (URI)    Sleep apnea    uses cpap   Stroke (HCC)    mimi   Vitamin D deficiency     Past Surgical History:  Procedure Laterality Date   COLONOSCOPY  12/28/2011   Procedure: COLONOSCOPY;  Surgeon: [name], MD;  Location: [facility];  Service: Endoscopy;  Laterality: N/A;   COLONOSCOPY     HEMORRHOID SURGERY     NASAL SINUS SURGERY Bilateral 10/30/2022   Procedure: ENDOSCOPIC NASAL POLYPECTOMY; FRONTAL ETHMOIDECTOMY AND MAXILLARY SINUS SURGERY;  Surgeon: [name], MD;  Location: [facility] OR;  Service: ENT;  Laterality: Bilateral;   UPPER GASTROINTESTINAL ENDOSCOPY       Current Outpatient Medications on File Prior to Visit  Medication Sig Dispense Refill   amLODipine (NORVASC) 5 MG tablet Take 1 tablet (5 mg total) by mouth daily. 90 tablet 1   aspirin EC 81 MG tablet Take 81 mg by mouth daily. Swallow whole.     atorvastatin (LIPITOR) 40 MG tablet Take 1 tablet (40 mg) by mouth daily. 90 tablet 0   losartan (COZAAR) 100 MG tablet Take 1 tablet (100 mg total) by mouth daily. 90 tablet 1   meloxicam (MOBIC) 7.5 MG tablet Take 1 tablet (7.5 mg total) by mouth daily. 30 tablet 0   Nebivolol HCl (BYSTOLIC) 20 MG TABS Take 1 tablet (20 mg total) by mouth daily. 90 tablet 1   omeprazole (PRILOSEC) 40 MG capsule Take 1 capsule (40 mg total) by mouth daily. 90 capsule 1   spironolactone (ALDACTONE) 25 MG tablet Take 1 tablet (25 mg total) by mouth daily. 90 tablet 3   No current facility-administered medications on file prior to visit.    Allergies   Allergen Reactions   Lisinopril     angioedema   Shrimp [Shellfish Allergy] Nausea And Vomiting     DIAGNOSTIC DATA (LABS, IMAGING, TESTING) - I reviewed patient records, labs, notes, testing and imaging myself where available.  CLINICAL INFORMATION/HISTORY: 5- 01-2023; patent referred as a STROKE patient, needing OR clearance. He presented to the ED with left hand weakness , this symptom had been present on and off for several month. Remote stroke was detected on CT head from 09-08-2022, localized to  the Right corona radiata. The patient went to urgent care in March with an elevated blood pressure from there he was referred back to his primary care physician and then reported that he had sometimes left hand grip strength problems a CT of the head on 3-22 confirmed the presence of a remote stroke.  Due to this finding he was unable to undergo polypectomy with ENT.  He is awaiting surgical clearance from [place].  He reports chronic rhinosinusitis, primary hypertension and has had sleep apnea on CPAP      Epworth sleepiness score: XYZ/24.   BMI: 30.5  kg/m   Neck Circumference: 17"   FINDINGS:   7 hours 56 minutes of Total Recording Time (hours, min):        Total Sleep Time (hours, min):    7 hours 17 minutes             Percent REM (%): 27.2%                                      Respiratory Indices:  Calculated pAHI (per hour): 39.9/h, severe sleep apnea                           REM pAHI:    59/h                                             NREM pAHI:    33.1/h                          Positional AHI: The patient slept mostly in supine associated with an AHI of 51.8/h this was followed by left lateral sleep with an AHI of 28.6/h and right lateral sleep with an AHI of 17.6/h.  There is a significant reduction by about 50% in AHI when sleeping nonsupine.   Snoring level reached a mean volume of 42 dB and was present for over 40% of total sleep time.                                                  Oxygen Saturation Statistics:        O2 Saturation Range (%):    Between 83 at nadir and a maximum saturation of 100% with a mean saturation of 95%                                   O2 Saturation (minutes) <89%:   2.3 minutes        Pulse Rate Statistics:   Pulse Mean (bpm): 54 bpm                Pulse Range:   Between 40 and 115 bpm              IMPRESSION:  This HST confirms the ongoing presence of severe obstructive sleep apnea.  There is a clear REM sleep preference and supine sleep accentuation for the patient's sleep apnea but even non-REM sleep and nonsupine sleep are still associated with severe apnea.   The patient needs to continue positive airway pressure therapy.  His in-lab sleep study from Nov 11, 2015 had shown also severe sleep apnea with the same distribution between REM and non-REM sleep, The patient had at the time being titrated to 8 cm water pressureThe patient had at the time being titrated to 8 cm water pressure.   RECOMMENDATION: renewal of CPAP machine replacement with autotitration device, set between 5-15 cm water, 2 cm EPR and humidifier and interface of patient's choice.  RV between day 30 and day 80 of new CPAP use with Np      INTERPRETING PHYSICIAN: Melvyn Novas, MD   [facility].       Lab Results  Component Value Date   WBC 5.9 09/18/2022   HGB 10.2 (L) 10/30/2022   HCT 30.0 (L) 10/30/2022   MCV 89.2 09/18/2022   PLT 166.0 09/18/2022      Component Value Date/Time   NA 142 05/14/2023 1531   NA 143 08/20/2019 1733   K 4.3 05/14/2023 1531   CL 104 05/14/2023 1531   CO2 26 05/14/2023 1531  GLUCOSE 94 05/14/2023 1531   BUN 19 05/14/2023 1531   BUN 17 08/20/2019 1733   CREATININE 1.54 (H) 05/14/2023 1531   CREATININE 1.48 (H) 04/03/2016 1319   CALCIUM 10.1 05/14/2023 1531   PROT 7.4 05/14/2023 1531   PROT 7.3 08/20/2019 1733   ALBUMIN 4.6 05/14/2023 1531   ALBUMIN 4.5 08/20/2019 1733   AST 21 05/14/2023  1531   ALT 18 05/14/2023 1531   ALKPHOS 65 05/14/2023 1531   BILITOT 0.7 05/14/2023 1531   BILITOT 0.4 08/20/2019 1733   GFRNONAA 57 (L) 08/20/2019 1733   GFRNONAA 55 (L) 04/03/2016 1319   GFRAA 66 08/20/2019 1733   GFRAA 63 04/03/2016 1319   Lab Results  Component Value Date   CHOL 120 05/14/2023   HDL 42.60 05/14/2023   LDLCALC 60 05/14/2023   TRIG 85.0 05/14/2023   CHOLHDL 3 05/14/2023   Lab Results  Component Value Date   HGBA1C 5.9 09/18/2022   No results found for: "VITAMINB12" Lab Results  Component Value Date   TSH 1.80 01/04/2022    PHYSICAL EXAM:  Today's Vitals   05/22/23 1607  BP: 122/69  Pulse: (!) 50  Weight: 226 lb (102.5 kg)  Height: 6' (1.829 m)   Body mass index is 30.65 kg/m.   Wt Readings from Last 3 Encounters:  05/22/23 226 lb (102.5 kg)  05/10/23 230 lb (104.3 kg)  04/09/23 222 lb 6.4 oz (100.9 kg)     Ht Readings from Last 3 Encounters:  05/22/23 6' (1.829 m)  04/09/23 6' (1.829 m)  12/19/22 6' (1.829 m)      Physical exam:   General: The patient is awake, alert and appears not in acute distress.  The patient is well groomed. Head: Normocephalic, atraumatic.  Neck is supple.  Neck circumference:17 Cardiovascular:  Regular rate and palpable peripheral pulse:  Respratory: clear to auscultation.  Mallampati2 , Skin:   Neurologic exam : The patient is awake and alert, oriented to place and time.  Memory subjective  described as intact.  There is a normal attention span & concentration ability.  Speech is fluent without  dysarthria, dysphonia or aphasia.  Mood and affect are appropriate.   Cranial nerves: Pupils are equal and briskly reactive to light. Funduscopic exam without  evidence of pallor or edema. Extraocular movements  in vertical and horizontal planes intact and without nystagmus. Visual fields by finger perimetry are intact. Hearing to finger rub intact.  Facial sensation intact to fine touch. Facial motor strength  is symmetric and tongue and uvula move midline.   Motor exam:   Normal tone and normal muscle bulk and symmetric normal strength in all extremities. Grip Strength is equal !  Proximal strength of shoulder muscles and hip flexors was equal .   Sensory:  Fine touch and vibration were felt less in the tip of all fingers of the left hand,    Coordination: Rapid alternating movements in the fingers/hands were normal.  Finger-to-nose maneuver was tested and showed no evidence of ataxia, dysmetria or tremor.   Gait and station: Patient walked without assistive device . Core Strength within normal limits. Stance is stable and of normal base.  Deep tendon reflexes: in the  upper and lower extremities are symmetric and  Brisk= and  without Clonus. Babinski maneuver response is bilaterally downgoing.    ASSESSMENT AND PLAN 57 y.o. year old male  here with:    1) insomnia-  unrelated to apnea, he is doing well on CPAP,  had HST confirmed severe OSA.   2) the patient wakes up and starts to worry, he may wake up for a bathroom break, usually not due to snoring,  choking or coughing.    3)  we discussed sleep modifications; keep the bed room cool, quiet and dark.  No caffeine in the last 4 hours of the day. Restrict hydration 2-3 hrs before bed time.   Consider non -vocal soft soothing music in the background to help relax, consider an audio book read by a pleasant voice.   Continue using CPAP as prescribed.   I will order a sleep aid that can be used prn, more of a mind calming presence of an aid if needed. Ambien generic 5 mg, taken prn with at least 6 hours of sleep time ahead.    I plan to follow up through our NP within 12 months.   I would like to thank Shade Flood, MD and Shade Flood, Md [address] [place],  [place] [address] for allowing me to meet with and to take care of this pleasant patient.     After spending a total time of  30  minutes face to face and  additional time for physical and neurologic examination, review of laboratory studies,  personal review of imaging studies, reports and results of other testing and review of referral information / records as far as provided in visit,   Electronically signed by: Melvyn Novas, MD 05/22/2023 4:26 PM  [facility] Board certified by The [employer] of Sleep Medicine and Diplomate of the [employer] of Sleep Medicine. Board certified In Neurology through the ABPN, Fellow of the [employer] of Neurology.

## 2023-06-07 DIAGNOSIS — G4733 Obstructive sleep apnea (adult) (pediatric): Secondary | ICD-10-CM | POA: Diagnosis not present

## 2023-06-17 DIAGNOSIS — G4733 Obstructive sleep apnea (adult) (pediatric): Secondary | ICD-10-CM | POA: Diagnosis not present

## 2023-07-01 NOTE — H&P (Signed)
 HPI:   Chief Complaint  Patient presents with   Nasal Congestion  Patient is here for nasal congestion since one or two months.   Raymond Green is a 58 y.o. male who presents as a new patient for nasal obstruction. Duration of about two months. He states the breathing was somewhat congested beforehand but since switching jobs and working with different chemicals, the congestion seems to have worsening. Right side is worse than the left. Has been using Dymista  nasal spray for two months without benefit. Other recent medical therapy has included a course of Augmentin  in April for possible sinusitis. He is not using any decongestant sprays. Uses CPAP regularly for severe obstructive sleep apnea. Diagnosed in 2017 with Dr. Chalice. AHI of 48.5, REM AHI 76.2, oxygen nadir of 81% but no significant prolonged desaturations  Denies fever, purulent rhinorrhea, clear rhinorrhea, headache, acute vision changes, sore throat, dysphagia, ear pain or hearing loss.  Surgeries include a sinus surgery in the 1980's.  No PMH of asthma, bleeding disorder, diabetes, stroke or myocardial infarction.   Non-smoker.  PMH/Meds/All/SocHx/FamHx/ROS:   History reviewed. No pertinent past medical history.  History reviewed. No pertinent surgical history.  No family history of bleeding disorders, wound healing problems or difficulty with anesthesia.   Social History   Socioeconomic History   Marital status: Married  Spouse name: Not on file   Number of children: Not on file   Years of education: Not on file   Highest education level: Not on file  Occupational History   Not on file  Tobacco Use   Smoking status: Never Smoker   Smokeless tobacco: Never Used  Substance and Sexual Activity   Alcohol use: Not Currently   Drug use: Not on file   Sexual activity: Not on file  Other Topics Concern   Not on file  Social History Narrative   Not on file   Social Determinants of Health   Financial Resource  Strain: Not on file  Food Insecurity: Not on file  Transportation Needs: Not on file  Physical Activity: Not on file  Stress: Not on file  Social Connections: Not on file  Housing Stability: Not on file   Current Outpatient Medications:   azelastine -fluticasone  (DYMISTA ) 137-50 mcg/spray nasal spray, 1 spray by Nasal route., Disp: , Rfl:   A complete ROS was performed with pertinent positives/negatives noted in the HPI. The remainder of the ROS are negative.   Physical Exam:   BP (!) 183/100  Pulse 60  Temp (!) 96.6 F (35.9 C)  Ht 1.829 m (6')  Wt 113.4 kg (250 lb)  BMI 33.91 kg/m   General Awake, at baseline alertness during examination.  Eyes No scleral icterus or conjunctival hemorrhage. Globe position appears normal. EOMI.  Right Ear EAC patent, TM intact w/o inflammation. Middle ear well aerated.  Left Ear EAC patent, TM intact w/o inflammation. Middle ear well aerated.  Nose Moderate hypertrophy of the left inferior turbinate. Afrin instilled in the nose. After five minutes patient noted significant improvement in nasal breathing. Exam reveals a rightward spurring of the septum. Left inferior turbinate reduced in size. No polyps or masses seen on anterior rhinoscopy.  Oral cavity No mucosal lesions or tumors seen. Tongue midline.  Oropharynx Symmetric tonsils.  Neck No abnormal cervical lymphadenopathy. No thyromegaly. No thyroid  masses palpated.  Cardio-vascular No cyanosis.  Pulmonary No audible stridor. Breathing easily with no labor.  Neuro Symmetric facial movement.  Psychiatry Appropriate affect and mood for clinic visit.  Independent Review of Additional Tests or Records:   Medical records.   Procedures:   None  Impression & Plans:  Raymond Green is a 58 y.o. male with chronic rhinitis. Exam reveals inferior turbinate hypertrophy and a rightward septal spurring. He was recently treated for possible acute sinusitis without change in symptoms. I have  recommended maxillofacial CT to rule out underlying sinus disease before discussion of possible septoplasty and turbinate reduction surgery with one of my colleagues.

## 2023-07-04 ENCOUNTER — Other Ambulatory Visit: Payer: Self-pay

## 2023-07-04 ENCOUNTER — Encounter (HOSPITAL_COMMUNITY): Payer: Self-pay | Admitting: Otolaryngology

## 2023-07-04 NOTE — Progress Notes (Signed)
 PCP - Benjiman Bras, MD Neurology Dohmeier, Raoul Byes, MD  Cardiologist -   PPM/ICD - denies Device Orders - n/a Rep Notified - n/a  Chest x-ray -  EKG - 11-17-22 Stress Test -  ECHO - 02-17-23 Cardiac Cath -   CPAP - uses nightly  Sleep test 11-07-22  DM denies  Blood Thinner Instructions: denies Aspirin Instructions: aspirin EC instructed patient to follow up with surgeon's office   ERAS Protcol - NPO  COVID TEST- n/a  Anesthesia review: Yes Hx of HTN, OSA   Patient verbally denies any shortness of breath, fever, cough and chest pain during phone call   -------------  SDW INSTRUCTIONS given:  Your procedure is scheduled on July 05, 2023.  Report to [facility] Main Entrance "A" at 11:00 A.M., and check in at the Admitting office.  Call this number if you have problems the morning of surgery:  [phone]   Remember:  Do not eat or drink after midnight the night before your surgery      Take these medicines the morning of surgery with A SIP OF WATER  amLODipine  (NORVASC )  atorvastatin  (LIPITOR)  omeprazole  (PRILOSEC)  Nebivolol  HCl (BYSTOLIC )   As of today, STOP taking any Aspirin (unless otherwise instructed by your surgeon) Aleve , Naproxen , Ibuprofen, Motrin, Advil, Goody's, BC's, all herbal medications, fish oil, and all vitamins. meloxicam  (MOBIC )                      Do not wear jewelry, make up, or nail polish            Do not wear lotions, powders, perfumes/colognes, or deodorant.            Do not shave 48 hours prior to surgery.  Men may shave face and neck.            Do not bring valuables to the hospital.            [facility] is not responsible for any belongings or valuables.  Do NOT Smoke (Tobacco/Vaping) 24 hours prior to your procedure If you use a CPAP at night, you may bring all equipment for your overnight stay.   Contacts, glasses, dentures or bridgework may not be worn into surgery.      For patients admitted to the hospital,  discharge time will be determined by your treatment team.   Patients discharged the day of surgery will not be allowed to drive home, and someone needs to stay with them for 24 hours.    Special instructions:   [facility]- Preparing For Surgery  Before surgery, you can play an important role. Because skin is not sterile, your skin needs to be as free of germs as possible. You can reduce the number of germs on your skin by washing with CHG (chlorahexidine gluconate) Soap before surgery.  CHG is an antiseptic cleaner which kills germs and bonds with the skin to continue killing germs even after washing.    Oral Hygiene is also important to reduce your risk of infection.  Remember - BRUSH YOUR TEETH THE MORNING OF SURGERY WITH YOUR REGULAR TOOTHPASTE  Please do not use if you have an allergy  to CHG or antibacterial soaps. If your skin becomes reddened/irritated stop using the CHG.  Do not shave (including legs and underarms) for at least 48 hours prior to first CHG shower. It is OK to shave your face.  Please follow these instructions carefully.   Shower the [employer]  BEFORE SURGERY and the MORNING OF SURGERY with DIAL Soap.   Pat yourself dry with a CLEAN TOWEL.  Wear CLEAN PAJAMAS to bed the night before surgery  Place CLEAN SHEETS on your bed the night of your first shower and DO NOT SLEEP WITH PETS.   Day of Surgery: Please shower morning of surgery  Wear Clean/Comfortable clothing the morning of surgery Do not apply any deodorants/lotions.   Remember to brush your teeth WITH YOUR REGULAR TOOTHPASTE.   Questions were answered. Patient verbalized understanding of instructions.

## 2023-07-05 ENCOUNTER — Other Ambulatory Visit (HOSPITAL_COMMUNITY): Payer: Self-pay

## 2023-07-05 ENCOUNTER — Ambulatory Visit (HOSPITAL_BASED_OUTPATIENT_CLINIC_OR_DEPARTMENT_OTHER): Payer: 59 | Admitting: Anesthesiology

## 2023-07-05 ENCOUNTER — Encounter (HOSPITAL_COMMUNITY)
Admission: RE | Disposition: A | Discharge status: Home / Self Care | Payer: Self-pay | Source: Home / Self Care | Attending: Otolaryngology

## 2023-07-05 ENCOUNTER — Ambulatory Visit (HOSPITAL_COMMUNITY): Payer: 59 | Admitting: Anesthesiology

## 2023-07-05 ENCOUNTER — Other Ambulatory Visit: Payer: Self-pay

## 2023-07-05 ENCOUNTER — Ambulatory Visit (HOSPITAL_COMMUNITY)
Admission: RE | Admit: 2023-07-05 | Discharge: 2023-07-05 | Disposition: A | Discharge status: Home / Self Care | Payer: 59 | Attending: Otolaryngology | Admitting: Otolaryngology

## 2023-07-05 DIAGNOSIS — G4733 Obstructive sleep apnea (adult) (pediatric): Secondary | ICD-10-CM | POA: Diagnosis not present

## 2023-07-05 DIAGNOSIS — Z8711 Personal history of peptic ulcer disease: Secondary | ICD-10-CM | POA: Diagnosis not present

## 2023-07-05 DIAGNOSIS — J343 Hypertrophy of nasal turbinates: Secondary | ICD-10-CM | POA: Insufficient documentation

## 2023-07-05 DIAGNOSIS — J342 Deviated nasal septum: Secondary | ICD-10-CM

## 2023-07-05 DIAGNOSIS — K219 Gastro-esophageal reflux disease without esophagitis: Secondary | ICD-10-CM | POA: Diagnosis not present

## 2023-07-05 DIAGNOSIS — Z8673 Personal history of transient ischemic attack (TIA), and cerebral infarction without residual deficits: Secondary | ICD-10-CM | POA: Insufficient documentation

## 2023-07-05 HISTORY — PX: SEPTOPLASTY: SHX2393

## 2023-07-05 LAB — CBC
HCT: 37.2 % — ABNORMAL LOW (ref 39.0–52.0)
Hemoglobin: 12 g/dL — ABNORMAL LOW (ref 13.0–17.0)
MCH: 29.2 pg (ref 26.0–34.0)
MCHC: 32.3 g/dL (ref 30.0–36.0)
MCV: 90.5 fL (ref 80.0–100.0)
Platelets: 153 10*3/uL (ref 150–400)
RBC: 4.11 MIL/uL — ABNORMAL LOW (ref 4.22–5.81)
RDW: 13.2 % (ref 11.5–15.5)
WBC: 3.6 10*3/uL — ABNORMAL LOW (ref 4.0–10.5)
nRBC: 0 % (ref 0.0–0.2)

## 2023-07-05 LAB — BASIC METABOLIC PANEL
Anion gap: 9 (ref 5–15)
BUN: 19 mg/dL (ref 6–20)
CO2: 21 mmol/L — ABNORMAL LOW (ref 22–32)
Calcium: 9.6 mg/dL (ref 8.9–10.3)
Chloride: 108 mmol/L (ref 98–111)
Creatinine, Ser: 1.46 mg/dL — ABNORMAL HIGH (ref 0.61–1.24)
GFR, Estimated: 56 mL/min — ABNORMAL LOW (ref >60)
Glucose, Bld: 85 mg/dL (ref 70–99)
Potassium: 4.1 mmol/L (ref 3.5–5.1)
Sodium: 138 mmol/L (ref 135–145)

## 2023-07-05 SURGERY — SEPTOPLASTY, NOSE
Anesthesia: General | Laterality: Bilateral

## 2023-07-05 MED ORDER — DEXAMETHASONE SODIUM PHOSPHATE 10 MG/ML IJ SOLN
INTRAMUSCULAR | Status: DC | PRN
  Administered 2023-07-05: 10 mg via INTRAVENOUS

## 2023-07-05 MED ORDER — FENTANYL CITRATE (PF) 100 MCG/2ML IJ SOLN
25.0000 ug | INTRAMUSCULAR | Status: DC | PRN
  Administered 2023-07-05: 50 ug via INTRAVENOUS

## 2023-07-05 MED ORDER — OXYMETAZOLINE HCL 0.05 % NA SOLN
NASAL | Status: DC | PRN
  Administered 2023-07-05: 1

## 2023-07-05 MED ORDER — BACITRACIN ZINC 500 UNIT/GM EX OINT
TOPICAL_OINTMENT | CUTANEOUS | Status: AC
  Filled 2023-07-05: qty 28.35

## 2023-07-05 MED ORDER — HYDROCODONE-ACETAMINOPHEN 7.5-325 MG PO TABS
1.0000 | ORAL_TABLET | Freq: Four times a day (QID) | ORAL | 0 refills | Status: DC | PRN
  Filled 2023-07-05: qty 20, 5d supply, fill #0

## 2023-07-05 MED ORDER — LIDOCAINE-EPINEPHRINE 1 %-1:100000 IJ SOLN
INTRAMUSCULAR | Status: DC | PRN
  Administered 2023-07-05: 3 mL

## 2023-07-05 MED ORDER — PROPOFOL 10 MG/ML IV BOLUS
INTRAVENOUS | Status: AC
  Filled 2023-07-05: qty 20

## 2023-07-05 MED ORDER — OXYMETAZOLINE HCL 0.05 % NA SOLN
2.0000 | NASAL | Status: DC
  Administered 2023-07-05: 2 via NASAL
  Filled 2023-07-05: qty 30

## 2023-07-05 MED ORDER — ORAL CARE MOUTH RINSE: 15.0000 mL | Freq: Once | OROMUCOSAL | Status: AC

## 2023-07-05 MED ORDER — ROCURONIUM BROMIDE 10 MG/ML (PF) SYRINGE
PREFILLED_SYRINGE | INTRAVENOUS | Status: DC | PRN
  Administered 2023-07-05: 60 mg via INTRAVENOUS

## 2023-07-05 MED ORDER — MIDAZOLAM HCL 2 MG/2ML IJ SOLN
INTRAMUSCULAR | Status: AC
Start: 2023-07-05 — End: ?
  Filled 2023-07-05: qty 2

## 2023-07-05 MED ORDER — BACITRACIN ZINC 500 UNIT/GM EX OINT
TOPICAL_OINTMENT | CUTANEOUS | Status: DC | PRN
  Administered 2023-07-05: 1 via TOPICAL

## 2023-07-05 MED ORDER — LIDOCAINE 2% (20 MG/ML) 5 ML SYRINGE
INTRAMUSCULAR | Status: DC | PRN
  Administered 2023-07-05: 60 mg via INTRAVENOUS

## 2023-07-05 MED ORDER — ONDANSETRON 4 MG PO TBDP
4.0000 mg | ORAL_TABLET | Freq: Three times a day (TID) | ORAL | 0 refills | Status: DC | PRN
  Filled 2023-07-05: qty 20, 7d supply, fill #0

## 2023-07-05 MED ORDER — ACETAMINOPHEN 500 MG PO TABS
1000.0000 mg | ORAL_TABLET | Freq: Once | ORAL | Status: AC
  Administered 2023-07-05: 1000 mg via ORAL
  Filled 2023-07-05: qty 2

## 2023-07-05 MED ORDER — MIDAZOLAM HCL 2 MG/2ML IJ SOLN
INTRAMUSCULAR | Status: DC | PRN
  Administered 2023-07-05: 2 mg via INTRAVENOUS

## 2023-07-05 MED ORDER — FENTANYL CITRATE (PF) 250 MCG/5ML IJ SOLN
INTRAMUSCULAR | Status: AC
  Filled 2023-07-05: qty 5

## 2023-07-05 MED ORDER — CHLORHEXIDINE GLUCONATE 0.12 % MT SOLN
15.0000 mL | Freq: Once | OROMUCOSAL | Status: AC
  Administered 2023-07-05: 15 mL via OROMUCOSAL
  Filled 2023-07-05: qty 15

## 2023-07-05 MED ORDER — SUGAMMADEX SODIUM 200 MG/2ML IV SOLN
INTRAVENOUS | Status: DC | PRN
  Administered 2023-07-05: 200 mg via INTRAVENOUS

## 2023-07-05 MED ORDER — FENTANYL CITRATE (PF) 250 MCG/5ML IJ SOLN
INTRAMUSCULAR | Status: DC | PRN
  Administered 2023-07-05: 50 ug via INTRAVENOUS

## 2023-07-05 MED ORDER — SODIUM CHLORIDE 0.9 % IV SOLN: INTRAVENOUS | Status: DC | PRN

## 2023-07-05 MED ORDER — DOUBLE ANTIBIOTIC 500-10000 UNIT/GM EX OINT
TOPICAL_OINTMENT | CUTANEOUS | Status: AC
  Filled 2023-07-05: qty 28.4

## 2023-07-05 MED ORDER — ONDANSETRON HCL 4 MG/2ML IJ SOLN
INTRAMUSCULAR | Status: DC | PRN
  Administered 2023-07-05: 4 mg via INTRAVENOUS

## 2023-07-05 MED ORDER — FENTANYL CITRATE (PF) 100 MCG/2ML IJ SOLN
INTRAMUSCULAR | Status: AC
  Filled 2023-07-05: qty 2

## 2023-07-05 MED ORDER — AMISULPRIDE (ANTIEMETIC) 5 MG/2ML IV SOLN: 10.0000 mg | Freq: Once | INTRAVENOUS | Status: DC | PRN

## 2023-07-05 MED ORDER — PROPOFOL 10 MG/ML IV BOLUS
INTRAVENOUS | Status: DC | PRN
  Administered 2023-07-05: 200 mg via INTRAVENOUS

## 2023-07-05 MED ORDER — LIDOCAINE-EPINEPHRINE 1 %-1:100000 IJ SOLN
INTRAMUSCULAR | Status: AC
  Filled 2023-07-05: qty 1

## 2023-07-05 SURGICAL SUPPLY — 26 items
ATTRACTOMAT 16X20 MAGNETIC DRP (DRAPES) IMPLANT
BAG COUNTER SPONGE SURGICOUNT (BAG) ×1 IMPLANT
BLADE SURG 15 STRL LF DISP TIS (BLADE) IMPLANT
CANISTER SUCT 3000ML PPV (MISCELLANEOUS) ×1 IMPLANT
COAGULATOR SUCT SWTCH 10FR 6 (ELECTROSURGICAL) IMPLANT
DRAPE HALF SHEET 40X57 (DRAPES) IMPLANT
DRSG TELFA 3X8 NADH STRL (GAUZE/BANDAGES/DRESSINGS) IMPLANT
ELECT REM PT RETURN 9FT ADLT (ELECTROSURGICAL) ×1
ELECTRODE REM PT RTRN 9FT ADLT (ELECTROSURGICAL) ×1 IMPLANT
GAUZE SPONGE 2X2 8PLY STRL LF (GAUZE/BANDAGES/DRESSINGS) ×1 IMPLANT
GLOVE ECLIPSE 7.5 STRL STRAW (GLOVE) ×1 IMPLANT
GOWN STRL REUS W/ TWL LRG LVL3 (GOWN DISPOSABLE) ×2 IMPLANT
KIT BASIN OR (CUSTOM PROCEDURE TRAY) ×1 IMPLANT
KIT TURNOVER KIT B (KITS) ×1 IMPLANT
NDL PRECISIONGLIDE 27X1.5 (NEEDLE) ×1 IMPLANT
NEEDLE PRECISIONGLIDE 27X1.5 (NEEDLE) ×1
NS IRRIG 1000ML POUR BTL (IV SOLUTION) ×1 IMPLANT
PAD ARMBOARD 7.5X6 YLW CONV (MISCELLANEOUS) ×2 IMPLANT
PATTIES SURGICAL .5 X3 (DISPOSABLE) ×1 IMPLANT
POSITIONER HEAD DONUT 9IN (MISCELLANEOUS) ×1 IMPLANT
SUT CHROMIC 4 0 RB 1X27 (SUTURE) IMPLANT
SUT ETHILON 3 0 PS 1 (SUTURE) IMPLANT
SUT PLAIN 4 0 ~~LOC~~ 1 (SUTURE) ×1 IMPLANT
TOWEL GREEN STERILE FF (TOWEL DISPOSABLE) ×1 IMPLANT
TRAY ENT MC OR (CUSTOM PROCEDURE TRAY) ×1 IMPLANT
WATER STERILE IRR 1000ML POUR (IV SOLUTION) ×1 IMPLANT

## 2023-07-05 NOTE — Anesthesia Procedure Notes (Addendum)
Procedure Name: Intubation Date/Time: 07/05/2023 1:59 PM  Performed by: Camillia Herter, CRNAPre-anesthesia Checklist: Patient identified, Emergency Drugs available, Suction available and Patient being monitored Patient Re-evaluated:Patient Re-evaluated prior to induction Oxygen Delivery Method: Circle System Utilized Preoxygenation: Pre-oxygenation with 100% oxygen Induction Type: IV induction Ventilation: Mask ventilation without difficulty Laryngoscope Size: Miller and 2 Grade View: Grade II Tube type: Oral Tube size: 8.0 mm Number of attempts: 1 Airway Equipment and Method: Stylet and Oral airway Placement Confirmation: ETT inserted through vocal cords under direct vision, positive ETCO2 and breath sounds checked- equal and bilateral Secured at: 24 cm Tube secured with: Tape Dental Injury: Teeth and Oropharynx as per pre-operative assessment  Comments: Large tongue

## 2023-07-05 NOTE — Anesthesia Preprocedure Evaluation (Addendum)
Anesthesia Evaluation  Patient identified by MRN, date of birth, ID band Patient awake    Reviewed: Allergy & Precautions, NPO status , Patient's Chart, lab work & pertinent test results  Airway Mallampati: II  TM Distance: >3 FB Neck ROM: Full    Dental   Pulmonary sleep apnea    breath sounds clear to auscultation       Cardiovascular hypertension, Pt. on medications  Rhythm:Regular Rate:Normal     Neuro/Psych CVA    GI/Hepatic Neg liver ROS, PUD,GERD  ,,  Endo/Other  negative endocrine ROS    Renal/GU Renal InsufficiencyRenal disease     Musculoskeletal   Abdominal   Peds  Hematology  (+) Blood dyscrasia, anemia   Anesthesia Other Findings   Reproductive/Obstetrics                             Anesthesia Physical Anesthesia Plan  ASA: 2  Anesthesia Plan: General   Post-op Pain Management: Tylenol PO (pre-op)*   Induction: Intravenous  PONV Risk Score and Plan: 2 and Dexamethasone, Ondansetron and Treatment may vary due to age or medical condition  Airway Management Planned: Oral ETT  Additional Equipment: None  Intra-op Plan:   Post-operative Plan: Extubation in OR  Informed Consent: I have reviewed the patients History and Physical, chart, labs and discussed the procedure including the risks, benefits and alternatives for the proposed anesthesia with the patient or authorized representative who has indicated his/her understanding and acceptance.     Dental advisory given  Plan Discussed with: CRNA  Anesthesia Plan Comments:        Anesthesia Quick Evaluation

## 2023-07-05 NOTE — Transfer of Care (Signed)
Immediate Anesthesia Transfer of Care Note  Patient: Raymond Green  Procedure(s) Performed: SEPTOPLASTY (Bilateral)  Patient Location: PACU  Anesthesia Type:General  Level of Consciousness: awake, alert , and oriented  Airway & Oxygen Therapy: Patient Spontanous Breathing  Post-op Assessment: Report given to RN and Post -op Vital signs reviewed and stable  Post vital signs: Reviewed and stable  Last Vitals:  Vitals Value Taken Time  BP 146/83 07/05/23 1515  Temp 36.6 C 07/05/23 1500  Pulse 48 07/05/23 1518  Resp 11 07/05/23 1518  SpO2 97 % 07/05/23 1518  Vitals shown include unfiled device data.  Last Pain:  Vitals:   07/05/23 1500  TempSrc:   PainSc: 5          Complications: No notable events documented.

## 2023-07-05 NOTE — Interval H&P Note (Signed)
History and Physical Interval Note:  07/05/2023 12:02 PM  Raymond Green  has presented today for surgery, with the diagnosis of Deviated septum.  The various methods of treatment have been discussed with the patient and family. After consideration of risks, benefits and other options for treatment, the patient has consented to  Procedure(s): SEPTOPLASTY (Bilateral) as a surgical intervention.  The patient's history has been reviewed, patient examined, no change in status, stable for surgery.  I have reviewed the patient's chart and labs.  Questions were answered to the patient's satisfaction.     [name]

## 2023-07-05 NOTE — Op Note (Signed)
OPERATIVE REPORT  DATE OF SURGERY: 07/05/2023  PATIENT:  Raymond Green,  58 y.o. male  PRE-OPERATIVE DIAGNOSIS:  Deviated septum  POST-OPERATIVE DIAGNOSIS:  Deviated septum  PROCEDURE:  Procedure(s): SEPTOPLASTY, bilateral inferior turbinate reduction  SURGEON:  Susy Frizzle, MD  ASSISTANTS: none  ANESTHESIA:   General   EBL: 30 ml  DRAINS: none  LOCAL MEDICATIONS USED:  1% Xylocaine with epinephrine  SPECIMEN:  none  COUNTS:  Correct  PROCEDURE DETAILS: The patient was taken to the operating room and placed on the operating table in the supine position. Following induction of general endotracheal anesthesia, the nose was prepped and draped in a standard fashion. Afrin spray was used preoperatively in the holding area. 1% Xylocaine with epinephrine was infiltrated into the septum, the columella, and the inferior turbinates bilaterally.  1. Nasal septoplasty. A left hemitransfixion incision was created with a 15 scalpel. A mucoperichondrial flap was developed posteriorly down the left side of the nasal septum using a Cottle elevator. This was performed all the way to the sphenoid rostrum. The bony cartilaginous junction was divided and a similar flap was developed down the right side. The ethmoid plate was severely deflected to the right as well as the vomer, as well as the maxillary crest, and all of these were resected.  A 4 mm osteotome was used for the maxillary crest.  2. Submucous resection inferior turbinates bilaterally. The leading edge of the inferior turbinates were incised in a vertical fashion with a #15 scalpel. The Cottle elevator was used to elevate mucosa off bone in all directions. Fragments of the turbinate bone were resected with a Takahashi forceps. The turbinate remnants were outfractured with the Therapist, nutritional.  All of these maneuvers greatly increased the nasal airways bilaterally. The nasal cavities were packed with rolled up Telfa coated with  bacitracin ointment. The pharynx was suctioned of blood and secretions under direct visualization. The patient was awakened extubated and transferred to recovery in stable condition.    PATIENT DISPOSITION:  To PACU, stable

## 2023-07-06 ENCOUNTER — Encounter (HOSPITAL_COMMUNITY): Payer: Self-pay | Admitting: Otolaryngology

## 2023-07-06 NOTE — Anesthesia Postprocedure Evaluation (Signed)
Anesthesia Post Note  Patient: Raymond Green  Procedure(s) Performed: SEPTOPLASTY (Bilateral)     Patient location during evaluation: PACU Anesthesia Type: General Level of consciousness: awake and alert Pain management: pain level controlled Vital Signs Assessment: post-procedure vital signs reviewed and stable Respiratory status: spontaneous breathing, nonlabored ventilation, respiratory function stable and patient connected to nasal cannula oxygen Cardiovascular status: blood pressure returned to baseline and stable Postop Assessment: no apparent nausea or vomiting Anesthetic complications: no   No notable events documented.  Last Vitals:  Vitals:   07/05/23 1515 07/05/23 1530  BP: (!) 146/83 (!) 154/86  Pulse: (!) 50 (!) 44  Resp: 12 11  Temp:  36.6 C  SpO2: 95% 98%    Last Pain:  Vitals:   07/05/23 1530  TempSrc:   PainSc: 3                  Kennieth Rad

## 2023-07-08 DIAGNOSIS — G4733 Obstructive sleep apnea (adult) (pediatric): Secondary | ICD-10-CM | POA: Diagnosis not present

## 2023-07-12 DIAGNOSIS — E785 Hyperlipidemia, unspecified: Secondary | ICD-10-CM | POA: Diagnosis not present

## 2023-07-12 DIAGNOSIS — I129 Hypertensive chronic kidney disease with stage 1 through stage 4 chronic kidney disease, or unspecified chronic kidney disease: Secondary | ICD-10-CM | POA: Diagnosis not present

## 2023-07-12 DIAGNOSIS — I10 Essential (primary) hypertension: Secondary | ICD-10-CM | POA: Diagnosis not present

## 2023-07-12 DIAGNOSIS — N182 Chronic kidney disease, stage 2 (mild): Secondary | ICD-10-CM | POA: Diagnosis not present

## 2023-07-12 DIAGNOSIS — N2581 Secondary hyperparathyroidism of renal origin: Secondary | ICD-10-CM | POA: Diagnosis not present

## 2023-07-13 ENCOUNTER — Other Ambulatory Visit: Payer: Self-pay | Admitting: Family Medicine

## 2023-07-13 ENCOUNTER — Other Ambulatory Visit: Payer: Self-pay

## 2023-07-13 ENCOUNTER — Other Ambulatory Visit (HOSPITAL_COMMUNITY): Payer: Self-pay

## 2023-07-16 ENCOUNTER — Other Ambulatory Visit (HOSPITAL_BASED_OUTPATIENT_CLINIC_OR_DEPARTMENT_OTHER): Payer: Self-pay

## 2023-07-17 LAB — LAB REPORT - SCANNED
Albumin, Urine POC: <3
Calcium: 10
Creatinine, POC: 69.1 mg/dL
EGFR: 53
Microalb Creat Ratio: <4
PTH: 61

## 2023-07-20 DIAGNOSIS — J342 Deviated nasal septum: Secondary | ICD-10-CM | POA: Diagnosis not present

## 2023-08-08 DIAGNOSIS — G4733 Obstructive sleep apnea (adult) (pediatric): Secondary | ICD-10-CM | POA: Diagnosis not present

## 2023-08-31 DIAGNOSIS — J342 Deviated nasal septum: Secondary | ICD-10-CM | POA: Diagnosis not present

## 2023-08-31 DIAGNOSIS — J339 Nasal polyp, unspecified: Secondary | ICD-10-CM | POA: Diagnosis not present

## 2023-09-03 ENCOUNTER — Encounter (HOSPITAL_COMMUNITY): Payer: Self-pay

## 2023-09-05 DIAGNOSIS — G4733 Obstructive sleep apnea (adult) (pediatric): Secondary | ICD-10-CM | POA: Diagnosis not present

## 2023-10-06 DIAGNOSIS — G4733 Obstructive sleep apnea (adult) (pediatric): Secondary | ICD-10-CM | POA: Diagnosis not present

## 2023-10-29 ENCOUNTER — Other Ambulatory Visit: Payer: Self-pay | Admitting: Family Medicine

## 2023-10-29 ENCOUNTER — Other Ambulatory Visit: Payer: Self-pay

## 2023-10-29 ENCOUNTER — Other Ambulatory Visit (HOSPITAL_COMMUNITY): Payer: Self-pay

## 2023-10-29 DIAGNOSIS — I10 Essential (primary) hypertension: Secondary | ICD-10-CM

## 2023-10-29 DIAGNOSIS — K219 Gastro-esophageal reflux disease without esophagitis: Secondary | ICD-10-CM

## 2023-10-29 MED ORDER — AMLODIPINE BESYLATE 5 MG PO TABS
5.0000 mg | ORAL_TABLET | Freq: Every day | ORAL | 1 refills | Status: DC
  Filled 2023-10-29: qty 90, 90d supply, fill #0
  Filled 2024-03-26: qty 90, 90d supply, fill #1

## 2023-10-29 MED ORDER — SPIRONOLACTONE 25 MG PO TABS
25.0000 mg | ORAL_TABLET | Freq: Every day | ORAL | 3 refills | Status: AC
  Filled 2023-10-29: qty 90, 90d supply, fill #0
  Filled 2024-03-26: qty 90, 90d supply, fill #1
  Filled 2024-07-08: qty 90, 90d supply, fill #2

## 2023-10-29 MED ORDER — NEBIVOLOL HCL 20 MG PO TABS
1.0000 | ORAL_TABLET | Freq: Every day | ORAL | 1 refills | Status: DC
  Filled 2023-10-29: qty 90, 90d supply, fill #0
  Filled 2024-03-26: qty 90, 90d supply, fill #1

## 2023-10-29 MED ORDER — ATORVASTATIN CALCIUM 40 MG PO TABS
40.0000 mg | ORAL_TABLET | Freq: Every day | ORAL | 0 refills | Status: DC
  Filled 2023-10-29: qty 90, 90d supply, fill #0

## 2023-10-29 MED ORDER — LOSARTAN POTASSIUM 100 MG PO TABS
100.0000 mg | ORAL_TABLET | Freq: Every day | ORAL | 1 refills | Status: DC
  Filled 2023-10-29: qty 90, 90d supply, fill #0
  Filled 2024-03-26: qty 90, 90d supply, fill #1

## 2023-10-29 MED ORDER — OMEPRAZOLE 40 MG PO CPDR
40.0000 mg | DELAYED_RELEASE_CAPSULE | Freq: Every day | ORAL | 1 refills | Status: DC
  Filled 2023-10-29: qty 90, 90d supply, fill #0
  Filled 2024-03-26: qty 90, 90d supply, fill #1

## 2023-11-05 ENCOUNTER — Encounter: Payer: Self-pay | Admitting: Family Medicine

## 2023-11-05 ENCOUNTER — Ambulatory Visit (INDEPENDENT_AMBULATORY_CARE_PROVIDER_SITE_OTHER): Payer: 59 | Admitting: Family Medicine

## 2023-11-05 VITALS — BP 120/72 | HR 48 | Temp 98.4°F | Resp 15 | Ht 70.25 in | Wt 235.2 lb

## 2023-11-05 DIAGNOSIS — R82998 Other abnormal findings in urine: Secondary | ICD-10-CM

## 2023-11-05 DIAGNOSIS — R39198 Other difficulties with micturition: Secondary | ICD-10-CM

## 2023-11-05 DIAGNOSIS — R351 Nocturia: Secondary | ICD-10-CM

## 2023-11-05 DIAGNOSIS — Z8673 Personal history of transient ischemic attack (TIA), and cerebral infarction without residual deficits: Secondary | ICD-10-CM

## 2023-11-05 DIAGNOSIS — N5089 Other specified disorders of the male genital organs: Secondary | ICD-10-CM

## 2023-11-05 DIAGNOSIS — R300 Dysuria: Secondary | ICD-10-CM

## 2023-11-05 DIAGNOSIS — E785 Hyperlipidemia, unspecified: Secondary | ICD-10-CM | POA: Diagnosis not present

## 2023-11-05 DIAGNOSIS — N401 Enlarged prostate with lower urinary tract symptoms: Secondary | ICD-10-CM | POA: Diagnosis not present

## 2023-11-05 DIAGNOSIS — Z0000 Encounter for general adult medical examination without abnormal findings: Secondary | ICD-10-CM | POA: Diagnosis not present

## 2023-11-05 DIAGNOSIS — I10 Essential (primary) hypertension: Secondary | ICD-10-CM | POA: Diagnosis not present

## 2023-11-05 DIAGNOSIS — M5442 Lumbago with sciatica, left side: Secondary | ICD-10-CM | POA: Diagnosis not present

## 2023-11-05 DIAGNOSIS — M5432 Sciatica, left side: Secondary | ICD-10-CM

## 2023-11-05 LAB — POCT URINALYSIS DIPSTICK
Bilirubin, UA: NEGATIVE
Blood, UA: NEGATIVE
Glucose, UA: NEGATIVE
Ketones, UA: NEGATIVE
Leukocytes, UA: NEGATIVE
Nitrite, UA: NEGATIVE
Protein, UA: NEGATIVE
Spec Grav, UA: 1.015 (ref 1.010–1.025)
Urobilinogen, UA: 0.2 U/dL
pH, UA: 6 (ref 5.0–8.0)

## 2023-11-05 MED ORDER — TAMSULOSIN HCL 0.4 MG PO CAPS
0.4000 mg | ORAL_CAPSULE | Freq: Every day | ORAL | 3 refills | Status: AC
  Filled 2023-11-05: qty 30, 30d supply, fill #0
  Filled 2024-03-26: qty 30, 30d supply, fill #1
  Filled 2024-07-08: qty 30, 30d supply, fill #2

## 2023-11-05 MED ORDER — MELOXICAM 7.5 MG PO TABS
7.5000 mg | ORAL_TABLET | Freq: Every day | ORAL | 0 refills | Status: DC
  Filled 2023-11-05: qty 30, 30d supply, fill #0

## 2023-11-05 NOTE — Patient Instructions (Addendum)
 Thanks for coming in today.  I will refer you to urology to discuss the nighttime urinary symptoms and the rounded area in the left scrotum.  I did refill the tamsulosin  for possible enlarged prostate as a cause but they will likely repeat your exam.  [facility]  - Dr. Blase Bur.  Address: [address] [place], [place], [place] [address]  [email or web address]  Phone: [phone]  If any concerns on labs I will let you know.  No med changes for now.  Discussed the chronic abdominal symptoms with your gastroenterologist to decide if further testing needed, but I am happy to follow-up with you if needed as well.  Let me know.  Take care!  Preventive Care 54-73 Years Old, Male Preventive care refers to lifestyle choices and visits with your health care provider that can promote health and wellness. Preventive care visits are also called wellness exams. What can I expect for my preventive care visit? Counseling During your preventive care visit, your health care provider may ask about your: Medical history, including: Past medical problems. Family medical history. Current health, including: Emotional well-being. Home life and relationship well-being. Sexual activity. Lifestyle, including: Alcohol, nicotine or tobacco, and drug use. Access to firearms. Diet, exercise, and sleep habits. Safety issues such as seatbelt and bike helmet use. Sunscreen use. Work and work Astronomer. Physical exam Your health care provider will check your: Height and weight. These may be used to calculate your BMI (body mass index). BMI is a measurement that tells if you are at a healthy weight. Waist circumference. This measures the distance around your waistline. This measurement also tells if you are at a healthy weight and may help predict your risk of certain diseases, such as type 2 diabetes and high blood pressure. Heart rate and blood pressure. Body temperature. Skin for abnormal spots. What  immunizations do I need?  Vaccines are usually given at various ages, according to a schedule. Your health care provider will recommend vaccines for you based on your age, medical history, and lifestyle or other factors, such as travel or where you work. What tests do I need? Screening Your health care provider may recommend screening tests for certain conditions. This may include: Lipid and cholesterol levels. Diabetes screening. This is done by checking your blood sugar (glucose) after you have not eaten for a while (fasting). Hepatitis B test. Hepatitis C test. HIV (human immunodeficiency virus) test. STI (sexually transmitted infection) testing, if you are at risk. Lung cancer screening. Prostate cancer screening. Colorectal cancer screening. Talk with your health care provider about your test results, treatment options, and if necessary, the need for more tests. Follow these instructions at home: Eating and drinking  Eat a diet that includes fresh fruits and vegetables, whole grains, lean protein, and low-fat dairy products. Take vitamin and mineral supplements as recommended by your health care provider. Do not drink alcohol if your health care provider tells you not to drink. If you drink alcohol: Limit how much you have to 0-2 drinks a day. Know how much alcohol is in your drink. In the [place] one drink equals one 12 oz bottle of beer (355 mL), one 5 oz glass of wine (148 mL), or one 1 oz glass of hard liquor (44 mL). Lifestyle Brush your teeth every morning and night with fluoride toothpaste. Floss one time each day. Exercise for at least 30 minutes 5 or more days each week. Do not use any products that contain nicotine or tobacco. These products include  cigarettes, chewing tobacco, and vaping devices, such as e-cigarettes. If you need help quitting, ask your health care provider. Do not use drugs. If you are sexually active, practice safe sex. Use a condom or other form of  protection to prevent STIs. Take aspirin only as told by your health care provider. Make sure that you understand how much to take and what form to take. Work with your health care provider to find out whether it is safe and beneficial for you to take aspirin daily. Find healthy ways to manage stress, such as: Meditation, yoga, or listening to music. Journaling. Talking to a trusted person. Spending time with friends and family. Minimize exposure to UV radiation to reduce your risk of skin cancer. Safety Always wear your seat belt while driving or riding in a vehicle. Do not drive: If you have been drinking alcohol. Do not ride with someone who has been drinking. When you are tired or distracted. While texting. If you have been using any mind-altering substances or drugs. Wear a helmet and other protective equipment during sports activities. If you have firearms in your house, make sure you follow all gun safety procedures. What's next? Go to your health care provider once a year for an annual wellness visit. Ask your health care provider how often you should have your eyes and teeth checked. Stay up to date on all vaccines. This information is not intended to replace advice given to you by your health care provider. Make sure you discuss any questions you have with your health care provider. Document Revised: 12/01/2020 Document Reviewed: 12/01/2020 Elsevier Patient Education  2024 [employer].

## 2023-11-05 NOTE — Progress Notes (Signed)
 Subjective:  Patient ID: Raymond Green, male    DOB: 03-Jun-1966  Age: 58 y.o. MRN: [id number]  CC:  Chief Complaint  Patient presents with   Annual Exam    Pt is doing okay, pt notes weak urinary stream and sometimes will have blood in the urine, pt would like his prostate manually checked  Discuss doing pneumonia vaccine    HPI Raymond Green presents for Annual Exam and acute concerns as above.  PCP, me ENT, Dr. Donalee Fruits, deviated septum with septoplasty in January. Doing well - feeling better.  Neuro/sleep specialist, Dr. Albertina Hugger, severe OSA on CPAP.  Last visit 05/22/2023.  Continuing on CPAP, insomnia discussed, follow-up in 12 months.  Ambien  5 mg. Gastroenterology, Dr. Brice Campi, colonoscopy in July 2023, flexible sigmoidoscopy in April 2024. Nephrology, Dr. Christianne Cowper, CKD.   Back pain better - occasional mobic  if needed. Not daily.   Weak urinary stream:  At night has noticed weak stream. Occasional leakage at night or some dribbling after urination. Past 4-5 months. Nocturia 1-2 times per night. No weak stream during day. No hematuria. No dysuria. Urology eval 5 years ago - normal. Similar sx's then.  Foamy urine at times.  Chart reviewed - saw Dr. Parke Boll in 2020 - BPH with LUTS, PVR 0, recommended tamsulosin  - took for awhile - no recent use.  Bump on skin next to left testicle - 6 months, no pain, no d/c, no change in size.  Tx: none.   Hypertension: With history of MCA, right sided arterial ischemic stroke, right corona radiata noted on prior CT, followed by neurology on aspirin.  Hypertension control for prior lacunar stroke noted by neuro.  Treated with losartan  100 mg daily, amlodipine  5 mg daily, spironolactone  25 mg daily, Bystolic  20 mg daily and aspirin 81 mg daily without new bleeding other than urinary symptoms as above. Also with history of CKD as above treated by nephrology previously, Dr. Christianne Cowper. Last visit noted from 07/12/2023.  CKD stage G2/A1.  Creatinine  stable at 1.4-1.5.  Suspected mild CKD most likely due to hypertension.  Workup for secondary hypertension was negative.  BP above goal, avoidance of NSAIDs, low-salt diet and avoiding fatty foods, plan for exercise and weight loss. Has been increasing exercise.  Vitamin D  was low at 19.4 - no supplements at this time. He plans to start otc supplement.  No med side effects.  Home readings: 140/80-85 BP Readings from Last 3 Encounters:  11/05/23 120/72  07/05/23 (!) 154/86  05/22/23 122/69   Lab Results  Component Value Date   CREATININE 1.46 (H) 07/05/2023   Hyperlipidemia: Treated with Lipitor 40 mg daily with secondary prevention with history of lacunar CVA as above. No new side effects.  Lab Results  Component Value Date   CHOL 120 05/14/2023   HDL 42.60 05/14/2023   LDLCALC 60 05/14/2023   TRIG 85.0 05/14/2023   CHOLHDL 3 05/14/2023   Lab Results  Component Value Date   ALT 18 05/14/2023   AST 21 05/14/2023   ALKPHOS 65 05/14/2023   BILITOT 0.7 05/14/2023       11/05/2023    3:40 PM 05/10/2023    4:07 PM 04/09/2023    4:02 PM 11/30/2022    3:45 PM 09/07/2022    3:48 PM  Depression screen PHQ 2/9  Decreased Interest 0 0 0 0 1  Down, Depressed, Hopeless 0 0 0 0 0  PHQ - 2 Score 0 0 0 0 1  Altered sleeping 1  0  0 0  Tired, decreased energy 0 0  0 0  Change in appetite 0 0  0 0  Feeling bad or failure about yourself  0 0  0 0  Trouble concentrating 1 0  0 0  Moving slowly or fidgety/restless 0 0  0 0  Suicidal thoughts 0 0  0 0  PHQ-9 Score 2 0  0 1  Difficult doing work/chores     Not difficult at all    Health Maintenance  Topic Date Due   Pneumococcal Vaccine 68-38 Years old (1 of 2 - PCV) Never done   COVID-19 Vaccine (4 - 2024-25 season) 02/18/2023   INFLUENZA VACCINE  01/18/2024   DTaP/Tdap/Td (4 - Td or Tdap) 08/31/2025   Colonoscopy  01/12/2032   Hepatitis C Screening  Completed   HIV Screening  Completed   Zoster Vaccines- Shingrix  Completed    HPV VACCINES  Aged Out   Meningococcal B Vaccine  Aged Out  Colonoscopy as above - 2023. Chronic abd pain on right side - no recent changes, plans to contact GI to discuss other testing or follow up with me if needed.  Prostate: does not have family history of prostate cancer The natural history of prostate cancer and ongoing controversy regarding screening and potential treatment outcomes of prostate cancer has been discussed with the patient. The meaning of a false positive PSA and a false negative PSA has been discussed. He indicates understanding of the limitations of this screening test and wishes to proceed with screening PSA testing. Lab Results  Component Value Date   PSA1 0.3 12/11/2017   PSA 1.18 05/14/2023   PSA 0.26 01/04/2022   PSA 0.37 09/01/2015    Immunization History  Administered Date(s) Administered   Fluad Quad(high Dose 65+) 03/10/2022   Influenza,inj,Quad PF,6+ Mos 03/23/2014, 02/22/2015   Influenza-Unspecified 03/19/2017, 03/19/2023   Meningococcal Conjugate 01/12/2014   PFIZER(Purple Top)SARS-COV-2 Vaccination 07/04/2019, 07/22/2019, 04/02/2020   Td 05/24/2001, 01/21/2014   Tdap 09/01/2015   Zoster Recombinant(Shingrix) 12/13/2017, 02/07/2018, 02/22/2018  PNA vaccine - will consider - not this visit.  Covid booster in fall  No results found. Optho in past year.   Dental: due - recommended. Requests name.   Alcohol: none  Tobacco: none  Exercise:30 min 3 days per week.    History Patient Active Problem List   Diagnosis Date Noted   Severe obstructive sleep apnea 05/22/2023   Atherosclerosis of native artery of left lower extremity with intermittent claudication (HCC) 12/19/2022   Old lacunar stroke without late effect 11/29/2022   Chronic rhinosinusitis with multiple nasal polyps 11/29/2022   Chronic rhinitis 11/23/2020   Deviated septum 11/23/2020   Mild coronary artery disease 02/12/2019   Mixed hyperlipidemia 01/14/2019   Angina pectoris (HCC)  01/13/2019   Palpitations 01/13/2019   Low back pain 07/13/2016   Abdominal pain, left lower quadrant 05/17/2016   Elevated serum creatinine 05/29/2015   Gout 10/29/2013   Primary hypertension 03/02/2008   GERD 03/02/2008   Iron deficiency anemia 12/13/2007   External hemorrhoids 12/13/2007   Past Medical History:  Diagnosis Date   Allergy     Anemia    Dysrhythmia    Erosive gastritis 2016   GERD (gastroesophageal reflux disease)    Headache(784.0)    Hemorrhoids    Hyperlipidemia    Hypertension    Kidney insufficiency    LVH (left ventricular hypertrophy)    Dr. Anastasia Balo ( cardiology) palpitations   Obesity    OSA (obstructive  sleep apnea)    Positive PPD, treated 2000   INH   Recurrent upper respiratory infection (URI)    Sleep apnea    uses cpap   Stroke (HCC)    mimi   Vitamin D  deficiency    Past Surgical History:  Procedure Laterality Date   COLONOSCOPY  12/28/2011   Procedure: COLONOSCOPY;  Surgeon: Janel Medford, MD;  Location: [facility];  Service: Endoscopy;  Laterality: N/A;   COLONOSCOPY     HEMORRHOID SURGERY     NASAL SINUS SURGERY Bilateral 10/30/2022   Procedure: ENDOSCOPIC NASAL POLYPECTOMY; FRONTAL ETHMOIDECTOMY AND MAXILLARY SINUS SURGERY;  Surgeon: Janita Mellow, MD;  Location: [facility] OR;  Service: ENT;  Laterality: Bilateral;   SEPTOPLASTY Bilateral 07/05/2023   Procedure: SEPTOPLASTY;  Surgeon: Janita Mellow, MD;  Location: [facility] OR;  Service: ENT;  Laterality: Bilateral;   UPPER GASTROINTESTINAL ENDOSCOPY     Allergies  Allergen Reactions   Lisinopril     angioedema   Shrimp [Shellfish Allergy ] Nausea And Vomiting   Prior to Admission medications   Medication Sig Start Date End Date Taking? Authorizing Provider  amLODipine  (NORVASC ) 5 MG tablet Take 1 tablet (5 mg total) by mouth daily. 10/29/23   Benjiman Bras, MD  aspirin EC 81 MG tablet Take 81 mg by mouth daily. Swallow whole.    [provider]  atorvastatin  (LIPITOR) 40 MG  tablet Take 1 tablet (40 mg) by mouth daily. 10/29/23   Benjiman Bras, MD  HYDROcodone -acetaminophen  (NORCO) 7.5-325 MG tablet Take 1 tablet by mouth every 6 (six) hours as needed for moderate pain (pain score 4-6). 07/05/23   Janita Mellow, MD  losartan  (COZAAR ) 100 MG tablet Take 1 tablet (100 mg total) by mouth daily. 10/29/23   Benjiman Bras, MD  meloxicam  (MOBIC ) 7.5 MG tablet Take 1 tablet (7.5 mg total) by mouth daily. 04/09/23   Benjiman Bras, MD  Nebivolol  HCl (BYSTOLIC ) 20 MG TABS Take 1 tablet (20 mg total) by mouth daily. 10/29/23   Benjiman Bras, MD  omeprazole  (PRILOSEC) 40 MG capsule Take 1 capsule (40 mg total) by mouth daily. 10/29/23   Benjiman Bras, MD  ondansetron  (ZOFRAN -ODT) 4 MG disintegrating tablet Dissolve 1 tablet (4 mg total) by mouth every 8 (eight) hours as needed for nausea or vomiting. 07/05/23   Janita Mellow, MD  spironolactone  (ALDACTONE ) 25 MG tablet Take 1 tablet (25 mg total) by mouth daily. 10/29/23     zolpidem  (AMBIEN ) 5 MG tablet Take 1 tablet (5 mg total) by mouth at bedtime as needed for sleep. 05/22/23   Dohmeier, Raoul Byes, MD   Social History   Socioeconomic History   Marital status: Married    Spouse name: Not on file   Number of children: 0   Years of education: Not on file   Highest education level: Not on file  Occupational History   Occupation: ENDOSCOPY TECHNICIAN    Employer: [employer]  Tobacco Use   Smoking status: Never   Smokeless tobacco: Never  Vaping Use   Vaping status: Never Used  Substance and Sexual Activity   Alcohol use: Not Currently    Comment: occasional   Drug use: Never   Sexual activity: Yes  Other Topics Concern   Not on file  Social History Narrative   Married   Education: Automotive engineer   Exercise: Yes   Social Drivers of Health   Financial Resource Strain: Not on file  Food Insecurity: Food Insecurity Present (09/02/2022)  Hunger Vital Sign    Worried About Running Out of Food in the Last Year:  Sometimes true    Ran Out of Food in the Last Year: Sometimes true  Transportation Needs: No Transportation Needs (09/02/2022)   PRAPARE - Administrator, Civil Service (Medical): No    Lack of Transportation (Non-Medical): No  Physical Activity: Not on file  Stress: Not on file  Social Connections: Not on file  Intimate Partner Violence: Not At Risk (09/02/2022)   Humiliation, Afraid, Rape, and Kick questionnaire    Fear of Current or Ex-Partner: No    Emotionally Abused: No    Physically Abused: No    Sexually Abused: No    Review of Systems  13 point review of systems per patient health survey noted.  Negative other than as indicated above or in HPI.   Objective:   Vitals:   11/05/23 1533  BP: 120/72  Pulse: (!) 48  Resp: 15  Temp: 98.4 F (36.9 C)  TempSrc: Temporal  SpO2: 98%  Weight: 235 lb 3.2 oz (106.7 kg)  Height: 5' 10.25" (1.784 m)     Physical Exam Vitals reviewed.  Constitutional:      Appearance: He is well-developed.  HENT:     Head: Normocephalic and atraumatic.     Right Ear: External ear normal.     Left Ear: External ear normal.  Eyes:     Conjunctiva/sclera: Conjunctivae normal.     Pupils: Pupils are equal, round, and reactive to light.  Neck:     Thyroid : No thyromegaly.  Cardiovascular:     Rate and Rhythm: Normal rate and regular rhythm.     Heart sounds: Normal heart sounds.  Pulmonary:     Effort: Pulmonary effort is normal. No respiratory distress.     Breath sounds: Normal breath sounds. No wheezing.  Abdominal:     General: There is no distension.     Palpations: Abdomen is soft. There is no mass.     Tenderness: There is no abdominal tenderness. There is no guarding.     Hernia: No hernia is present.  Genitourinary:    Comments: No inguinal hernia or lymphadenopathy appreciated.  Small rounded area within the left upper scrotum, question varicocele, testicle nontender without appreciable testicle masses.  Prostate  diffusely firm, enlarged without discrete nodule Musculoskeletal:        General: No tenderness. Normal range of motion.     Cervical back: Normal range of motion and neck supple.  Lymphadenopathy:     Cervical: No cervical adenopathy.  Skin:    General: Skin is warm and dry.  Neurological:     Mental Status: He is alert and oriented to person, place, and time.     Deep Tendon Reflexes: Reflexes are normal and symmetric.  Psychiatric:        Behavior: Behavior normal.        Assessment & Plan:  IHSAN NOMURA is a 58 y.o. male . Annual physical exam  - -anticipatory guidance as below in AVS, screening labs above. Health maintenance items as above in HPI discussed/recommended as applicable.   Left sided sciatica Left-sided low back pain with left-sided sciatica, unspecified chronicity  - Improved.  Meloxicam  if needed, but recommended minimizing use given potential side effects and impact on renal function.  RTC precautions.  Dysuria - Plan: POCT urinalysis dipstick, tamsulosin  (FLOMAX ) 0.4 MG CAPS capsule, PSA, Ambulatory referral to Urology Benign prostatic hyperplasia with nocturia - Plan:  tamsulosin  (FLOMAX ) 0.4 MG CAPS capsule, PSA, Ambulatory referral to Urology Abnormal urinary stream  - BPH with LUTS previously treated by urology, some increased symptoms as above.  Check PSA, refer back to urology for discussion of treatment and repeat exam, restarted Flomax  for now with potential side effects discussed.  Primary hypertension - Plan: CBC with Differential/Platelet, TSH, Comprehensive metabolic panel with GFR  - Stable in office, continue same med regimen, check labs as above and adjust plan accordingly  History of cerebral infarction  - No new neurologic symptoms, tolerating meds as above.  Continue same  Hyperlipidemia, unspecified hyperlipidemia type - Plan: Lipid panel  - Tolerating Lipitor.  Continue same for now, check lipids and adjust plan  accordingly.  Foamy urine  - In office urinalysis negative for protein.  Followed by nephrology as above.  Lump in scrotum - Plan: Ambulatory referral to Urology  - Question varicocele, nontender.  Option of ultrasound but since he will be seeing urology defers until their evaluation.  Meds ordered this encounter  Medications   meloxicam  (MOBIC ) 7.5 MG tablet    Sig: Take 1 tablet (7.5 mg total) by mouth daily.    Dispense:  30 tablet    Refill:  0   tamsulosin  (FLOMAX ) 0.4 MG CAPS capsule    Sig: Take 1 capsule (0.4 mg total) by mouth daily.    Dispense:  30 capsule    Refill:  3   Patient Instructions  Thanks for coming in today.  I will refer you to urology to discuss the nighttime urinary symptoms and the rounded area in the left scrotum.  I did refill the tamsulosin  for possible enlarged prostate as a cause but they will likely repeat your exam.  [facility]  - Dr. Blase Bur.  Address: [address] [place], [place], [place] [address]  [email or web address]  Phone: [phone]  If any concerns on labs I will let you know.  No med changes for now.  Discussed the chronic abdominal symptoms with your gastroenterologist to decide if further testing needed, but I am happy to follow-up with you if needed as well.  Let me know.  Take care!  Preventive Care 85-13 Years Old, Male Preventive care refers to lifestyle choices and visits with your health care provider that can promote health and wellness. Preventive care visits are also called wellness exams. What can I expect for my preventive care visit? Counseling During your preventive care visit, your health care provider may ask about your: Medical history, including: Past medical problems. Family medical history. Current health, including: Emotional well-being. Home life and relationship well-being. Sexual activity. Lifestyle, including: Alcohol, nicotine or tobacco, and drug use. Access to firearms. Diet, exercise, and sleep  habits. Safety issues such as seatbelt and bike helmet use. Sunscreen use. Work and work Astronomer. Physical exam Your health care provider will check your: Height and weight. These may be used to calculate your BMI (body mass index). BMI is a measurement that tells if you are at a healthy weight. Waist circumference. This measures the distance around your waistline. This measurement also tells if you are at a healthy weight and may help predict your risk of certain diseases, such as type 2 diabetes and high blood pressure. Heart rate and blood pressure. Body temperature. Skin for abnormal spots. What immunizations do I need?  Vaccines are usually given at various ages, according to a schedule. Your health care provider will recommend vaccines for you based on your age, medical history, and lifestyle  or other factors, such as travel or where you work. What tests do I need? Screening Your health care provider may recommend screening tests for certain conditions. This may include: Lipid and cholesterol levels. Diabetes screening. This is done by checking your blood sugar (glucose) after you have not eaten for a while (fasting). Hepatitis B test. Hepatitis C test. HIV (human immunodeficiency virus) test. STI (sexually transmitted infection) testing, if you are at risk. Lung cancer screening. Prostate cancer screening. Colorectal cancer screening. Talk with your health care provider about your test results, treatment options, and if necessary, the need for more tests. Follow these instructions at home: Eating and drinking  Eat a diet that includes fresh fruits and vegetables, whole grains, lean protein, and low-fat dairy products. Take vitamin and mineral supplements as recommended by your health care provider. Do not drink alcohol if your health care provider tells you not to drink. If you drink alcohol: Limit how much you have to 0-2 drinks a day. Know how much alcohol is in your  drink. In the [place] one drink equals one 12 oz bottle of beer (355 mL), one 5 oz glass of wine (148 mL), or one 1 oz glass of hard liquor (44 mL). Lifestyle Brush your teeth every morning and night with fluoride toothpaste. Floss one time each day. Exercise for at least 30 minutes 5 or more days each week. Do not use any products that contain nicotine or tobacco. These products include cigarettes, chewing tobacco, and vaping devices, such as e-cigarettes. If you need help quitting, ask your health care provider. Do not use drugs. If you are sexually active, practice safe sex. Use a condom or other form of protection to prevent STIs. Take aspirin only as told by your health care provider. Make sure that you understand how much to take and what form to take. Work with your health care provider to find out whether it is safe and beneficial for you to take aspirin daily. Find healthy ways to manage stress, such as: Meditation, yoga, or listening to music. Journaling. Talking to a trusted person. Spending time with friends and family. Minimize exposure to UV radiation to reduce your risk of skin cancer. Safety Always wear your seat belt while driving or riding in a vehicle. Do not drive: If you have been drinking alcohol. Do not ride with someone who has been drinking. When you are tired or distracted. While texting. If you have been using any mind-altering substances or drugs. Wear a helmet and other protective equipment during sports activities. If you have firearms in your house, make sure you follow all gun safety procedures. What's next? Go to your health care provider once a year for an annual wellness visit. Ask your health care provider how often you should have your eyes and teeth checked. Stay up to date on all vaccines. This information is not intended to replace advice given to you by your health care provider. Make sure you discuss any questions you have with your health care  provider. Document Revised: 12/01/2020 Document Reviewed: 12/01/2020 Elsevier Patient Education  2024 Elsevier Inc.    Signed,   Caro Christmas, MD [facility], [facility] 11/05/23 4:03 PM

## 2023-11-06 ENCOUNTER — Other Ambulatory Visit: Payer: Self-pay

## 2023-11-06 ENCOUNTER — Encounter: Payer: Self-pay | Admitting: Family Medicine

## 2023-11-06 ENCOUNTER — Other Ambulatory Visit (HOSPITAL_COMMUNITY): Payer: Self-pay

## 2023-11-06 LAB — COMPREHENSIVE METABOLIC PANEL WITH GFR
ALT: 14 U/L (ref 0–53)
AST: 21 U/L (ref 0–37)
Albumin: 4.4 g/dL (ref 3.5–5.2)
Alkaline Phosphatase: 56 U/L (ref 39–117)
BUN: 16 mg/dL (ref 6–23)
CO2: 27 meq/L (ref 19–32)
Calcium: 9.7 mg/dL (ref 8.4–10.5)
Chloride: 106 meq/L (ref 96–112)
Creatinine, Ser: 1.45 mg/dL (ref 0.40–1.50)
GFR: 53.5 mL/min — ABNORMAL LOW (ref >60.00)
Glucose, Bld: 87 mg/dL (ref 70–99)
Potassium: 4.5 meq/L (ref 3.5–5.1)
Sodium: 139 meq/L (ref 135–145)
Total Bilirubin: 0.7 mg/dL (ref 0.2–1.2)
Total Protein: 7.2 g/dL (ref 6.0–8.3)

## 2023-11-06 LAB — CBC WITH DIFFERENTIAL/PLATELET
Basophils Absolute: 0 10*3/uL (ref 0.0–0.1)
Basophils Relative: 0.8 % (ref 0.0–3.0)
Eosinophils Absolute: 0.3 10*3/uL (ref 0.0–0.7)
Eosinophils Relative: 6.6 % — ABNORMAL HIGH (ref 0.0–5.0)
HCT: 36.2 % — ABNORMAL LOW (ref 39.0–52.0)
Hemoglobin: 12.3 g/dL — ABNORMAL LOW (ref 13.0–17.0)
Lymphocytes Relative: 37.7 % (ref 12.0–46.0)
Lymphs Abs: 1.7 10*3/uL (ref 0.7–4.0)
MCHC: 33.9 g/dL (ref 30.0–36.0)
MCV: 87.9 fl (ref 78.0–100.0)
Monocytes Absolute: 0.3 10*3/uL (ref 0.1–1.0)
Monocytes Relative: 7.6 % (ref 3.0–12.0)
Neutro Abs: 2.1 10*3/uL (ref 1.4–7.7)
Neutrophils Relative %: 47.3 % (ref 43.0–77.0)
Platelets: 151 10*3/uL (ref 150.0–400.0)
RBC: 4.12 Mil/uL — ABNORMAL LOW (ref 4.22–5.81)
RDW: 14 % (ref 11.5–15.5)
WBC: 4.5 10*3/uL (ref 4.0–10.5)

## 2023-11-06 LAB — LIPID PANEL
Cholesterol: 128 mg/dL (ref 0–200)
HDL: 48 mg/dL (ref >39.00)
LDL Cholesterol: 62 mg/dL (ref 0–99)
NonHDL: 79.71
Total CHOL/HDL Ratio: 3
Triglycerides: 88 mg/dL (ref 0.0–149.0)
VLDL: 17.6 mg/dL (ref 0.0–40.0)

## 2023-11-06 LAB — PSA: PSA: 0.38 ng/mL (ref 0.10–4.00)

## 2023-11-06 LAB — TSH: TSH: 1.44 u[IU]/mL (ref 0.35–5.50)

## 2023-11-07 DIAGNOSIS — G4733 Obstructive sleep apnea (adult) (pediatric): Secondary | ICD-10-CM | POA: Diagnosis not present

## 2023-11-10 ENCOUNTER — Encounter: Payer: Self-pay | Admitting: Gastroenterology

## 2023-11-10 ENCOUNTER — Other Ambulatory Visit: Payer: Self-pay | Admitting: Gastroenterology

## 2023-11-10 ENCOUNTER — Ambulatory Visit: Payer: Self-pay | Admitting: Family Medicine

## 2023-11-10 DIAGNOSIS — D649 Anemia, unspecified: Secondary | ICD-10-CM | POA: Insufficient documentation

## 2023-11-10 NOTE — Progress Notes (Signed)
 I was notified that this patient had a slight anemia that has persisted over the course of the last 4 months. I am going to order a set of anemia labs. This may be a result of his underlying mild chronic renal insufficiency versus other etiologies.  Will rule out iron deficiency, B12 deficiency, folate deficiency.  Yong Henle, MD [facility] Office # [phone]

## 2023-11-22 ENCOUNTER — Other Ambulatory Visit (INDEPENDENT_AMBULATORY_CARE_PROVIDER_SITE_OTHER)

## 2023-11-22 DIAGNOSIS — D649 Anemia, unspecified: Secondary | ICD-10-CM

## 2023-11-23 ENCOUNTER — Ambulatory Visit: Payer: Self-pay | Admitting: Gastroenterology

## 2023-11-23 LAB — IRON,TIBC AND FERRITIN PANEL
%SAT: 24 % (ref 20–48)
Ferritin: 65 ng/mL (ref 38–380)
Iron: 78 ug/dL (ref 50–180)
TIBC: 319 ug/dL (ref 250–425)

## 2023-11-23 LAB — RETICULOCYTES
ABS Retic: 58660 {cells}/uL (ref 25000–90000)
Retic Ct Pct: 1.4 %

## 2023-11-23 LAB — B12 AND FOLATE PANEL
Folate: 14.4 ng/mL (ref >5.9)
Vitamin B-12: 539 pg/mL (ref 211–911)

## 2023-11-26 ENCOUNTER — Other Ambulatory Visit: Payer: Self-pay

## 2023-11-26 DIAGNOSIS — D649 Anemia, unspecified: Secondary | ICD-10-CM

## 2024-01-10 DIAGNOSIS — I10 Essential (primary) hypertension: Secondary | ICD-10-CM | POA: Diagnosis not present

## 2024-01-10 DIAGNOSIS — N182 Chronic kidney disease, stage 2 (mild): Secondary | ICD-10-CM | POA: Diagnosis not present

## 2024-01-10 DIAGNOSIS — K279 Peptic ulcer, site unspecified, unspecified as acute or chronic, without hemorrhage or perforation: Secondary | ICD-10-CM | POA: Diagnosis not present

## 2024-01-10 DIAGNOSIS — E785 Hyperlipidemia, unspecified: Secondary | ICD-10-CM | POA: Diagnosis not present

## 2024-01-14 DIAGNOSIS — N182 Chronic kidney disease, stage 2 (mild): Secondary | ICD-10-CM | POA: Diagnosis not present

## 2024-01-15 LAB — LAB REPORT - SCANNED: EGFR: 51

## 2024-01-18 ENCOUNTER — Encounter: Payer: Self-pay | Admitting: Gastroenterology

## 2024-01-18 NOTE — Progress Notes (Signed)
 I have reviewed the patient's [facility] results from 7/25 Ferritin 58 B12 584 Folate 7.6 Iron/TIBC 82/272 Iron saturation 30% Hemoglobin/hematocrit 11.9/37 (13/37.5 lower limits of normal)  Based on the patient still having anemia of an unclear etiology, I recommend a hematology evaluation/referral.  I have discussed this with the patient and he agrees with this plan of action.  I will forward this to my staff to work on scheduling/placing the referral.   Aloha Finner, MD [facility] Office # [phone]

## 2024-01-22 ENCOUNTER — Other Ambulatory Visit: Payer: Self-pay

## 2024-01-22 DIAGNOSIS — D649 Anemia, unspecified: Secondary | ICD-10-CM

## 2024-02-05 DIAGNOSIS — G4733 Obstructive sleep apnea (adult) (pediatric): Secondary | ICD-10-CM | POA: Diagnosis not present

## 2024-03-04 ENCOUNTER — Inpatient Hospital Stay: Attending: Oncology | Admitting: Oncology

## 2024-03-04 ENCOUNTER — Inpatient Hospital Stay

## 2024-03-04 ENCOUNTER — Encounter: Payer: Self-pay | Admitting: Oncology

## 2024-03-04 VITALS — BP 98/60 | HR 46 | Temp 98.1°F | Resp 16 | Wt 235.2 lb

## 2024-03-04 DIAGNOSIS — Z7982 Long term (current) use of aspirin: Secondary | ICD-10-CM | POA: Diagnosis not present

## 2024-03-04 DIAGNOSIS — Z79899 Other long term (current) drug therapy: Secondary | ICD-10-CM | POA: Diagnosis not present

## 2024-03-04 DIAGNOSIS — I129 Hypertensive chronic kidney disease with stage 1 through stage 4 chronic kidney disease, or unspecified chronic kidney disease: Secondary | ICD-10-CM | POA: Diagnosis not present

## 2024-03-04 DIAGNOSIS — N183 Chronic kidney disease, stage 3 unspecified: Secondary | ICD-10-CM | POA: Insufficient documentation

## 2024-03-04 DIAGNOSIS — K648 Other hemorrhoids: Secondary | ICD-10-CM | POA: Insufficient documentation

## 2024-03-04 DIAGNOSIS — D649 Anemia, unspecified: Secondary | ICD-10-CM | POA: Insufficient documentation

## 2024-03-04 DIAGNOSIS — K644 Residual hemorrhoidal skin tags: Secondary | ICD-10-CM | POA: Insufficient documentation

## 2024-03-04 LAB — CMP (CANCER CENTER ONLY)
ALT: 17 U/L (ref 0–44)
AST: 29 U/L (ref 15–41)
Albumin: 4.4 g/dL (ref 3.5–5.0)
Alkaline Phosphatase: 65 U/L (ref 38–126)
Anion gap: 10 (ref 5–15)
BUN: 14 mg/dL (ref 6–20)
CO2: 27 mmol/L (ref 22–32)
Calcium: 10.1 mg/dL (ref 8.9–10.3)
Chloride: 102 mmol/L (ref 98–111)
Creatinine: 1.68 mg/dL — ABNORMAL HIGH (ref 0.61–1.24)
GFR, Estimated: 47 mL/min — ABNORMAL LOW (ref >60)
Glucose, Bld: 86 mg/dL (ref 70–99)
Potassium: 4.2 mmol/L (ref 3.5–5.1)
Sodium: 138 mmol/L (ref 135–145)
Total Bilirubin: 0.8 mg/dL (ref 0.0–1.2)
Total Protein: 7.6 g/dL (ref 6.5–8.1)

## 2024-03-04 LAB — CBC WITH DIFFERENTIAL (CANCER CENTER ONLY)
Abs Immature Granulocytes: 0.01 K/uL (ref 0.00–0.07)
Basophils Absolute: 0 K/uL (ref 0.0–0.1)
Basophils Relative: 1 %
Eosinophils Absolute: 0.3 K/uL (ref 0.0–0.5)
Eosinophils Relative: 7 %
HCT: 36.9 % — ABNORMAL LOW (ref 39.0–52.0)
Hemoglobin: 12.5 g/dL — ABNORMAL LOW (ref 13.0–17.0)
Immature Granulocytes: 0 %
Lymphocytes Relative: 37 %
Lymphs Abs: 1.4 K/uL (ref 0.7–4.0)
MCH: 30.6 pg (ref 26.0–34.0)
MCHC: 33.9 g/dL (ref 30.0–36.0)
MCV: 90.4 fL (ref 80.0–100.0)
Monocytes Absolute: 0.3 K/uL (ref 0.1–1.0)
Monocytes Relative: 7 %
Neutro Abs: 1.8 K/uL (ref 1.7–7.7)
Neutrophils Relative %: 48 %
Platelet Count: 148 K/uL — ABNORMAL LOW (ref 150–400)
RBC: 4.08 MIL/uL — ABNORMAL LOW (ref 4.22–5.81)
RDW: 12.7 % (ref 11.5–15.5)
WBC Count: 3.7 K/uL — ABNORMAL LOW (ref 4.0–10.5)
nRBC: 0 % (ref 0.0–0.2)

## 2024-03-04 LAB — DIRECT ANTIGLOBULIN TEST (NOT AT ARMC)
DAT, IgG: NEGATIVE
DAT, complement: NEGATIVE

## 2024-03-04 LAB — RETICULOCYTES
Immature Retic Fract: 9.9 % (ref 2.3–15.9)
RBC.: 4.03 MIL/uL — ABNORMAL LOW (ref 4.22–5.81)
Retic Count, Absolute: 56.8 K/uL (ref 19.0–186.0)
Retic Ct Pct: 1.4 % (ref 0.4–3.1)

## 2024-03-04 LAB — LACTATE DEHYDROGENASE: LDH: 195 U/L — ABNORMAL HIGH (ref 98–192)

## 2024-03-04 LAB — TSH: TSH: 1.29 u[IU]/mL (ref 0.350–4.500)

## 2024-03-04 NOTE — Assessment & Plan Note (Addendum)
 Anemia has been present since at least 2015 with fluctuating hemoglobin levels, currently around 11.8 to 12.3 g/dL.   Previous workup including iron and B12 levels were normal. No obvious sources of blood loss identified.   Chronic kidney disease stage 3 may contribute to anemia due to reduced erythropoietin production. Differential diagnosis includes ineffective erythropoiesis, autoimmune hemolysis, and hemoglobinopathies.   Further testing is required to elucidate the etiology.   No intervention is needed at current hemoglobin levels.   Erythropoietin injections are considered if hemoglobin consistently falls below 9 g/dL.  Labs today showed stable hemoglobin of 12.5, hematocrit 36.9, MCV 90.4.  White count 3700 with normal differential.  Platelet count 148,000.  Creatinine 1.68, otherwise unremarkable CMP.  TSH, LDH are within normal limits.  We did check methylmalonic acid, haptoglobin, Coombs test, hemoglobin electrophoresis, SPEP, IFE, quantitate immunoglobulins, serum free light chains, TSH to look for other possible etiologies for his normocytic anemia.  - Plan follow-up call in 1-2 weeks to discuss test results  - Schedule follow-up appointment in 4 months unless earlier intervention is needed based on test results  No intervention needed at current values.  He was provided reassurance.  Most likely he has anemia of chronic kidney disease.

## 2024-03-04 NOTE — Progress Notes (Signed)
 [facility]  HEMATOLOGY CLINIC CONSULTATION NOTE   PATIENT NAME: Raymond Green   MR#: [id number] DOB: [birth year]  DATE OF SERVICE: 03/04/2024  Patient Care Team: Levora Reyes SAUNDERS, MD as PCP - General (Family Medicine) Starla Laymon JONETTA DEVONNA as Physician Assistant (Physician Assistant) Jesus Oliphant, MD as Consulting Physician (Otolaryngology) Gearline Norris, MD as Consulting Physician (Nephrology) Mansouraty, Aloha Raddle., MD as Consulting Physician (Gastroenterology)  REASON FOR CONSULTATION/ CHIEF COMPLAINT:  Evaluation of anemia.  ASSESSMENT & PLAN:   Raymond Green is a 58 y.o. gentleman with a past medical history of hypertension, dyslipidemia, CKD stage III, sleep apnea, was referred to our service for evaluation of normocytic anemia.    Normocytic anemia Anemia has been present since at least 2015 with fluctuating hemoglobin levels, currently around 11.8 to 12.3 g/dL.   Previous workup including iron and B12 levels were normal. No obvious sources of blood loss identified.   Chronic kidney disease stage 3 may contribute to anemia due to reduced erythropoietin production. Differential diagnosis includes ineffective erythropoiesis, autoimmune hemolysis, and hemoglobinopathies.   Further testing is required to elucidate the etiology.   No intervention is needed at current hemoglobin levels.   Erythropoietin injections are considered if hemoglobin consistently falls below 9 g/dL.  Labs today showed stable hemoglobin of 12.5, hematocrit 36.9, MCV 90.4.  White count 3700 with normal differential.  Platelet count 148,000.  Creatinine 1.68, otherwise unremarkable CMP.  TSH, LDH are within normal limits.  We did check methylmalonic acid, haptoglobin, Coombs test, hemoglobin electrophoresis, SPEP, IFE, quantitate immunoglobulins, serum free light chains, TSH to look for other possible etiologies for his normocytic anemia.  - Plan follow-up call in 1-2  weeks to discuss test results  - Schedule follow-up appointment in 4 months unless earlier intervention is needed based on test results  No intervention needed at current values.  He was provided reassurance.  Most likely he has anemia of chronic kidney disease.   I reviewed lab results and outside records for this visit and discussed relevant results with the patient. Diagnosis, plan of care and treatment options were also discussed in detail with the patient. Opportunity provided to ask questions and answers provided to his apparent satisfaction. Provided instructions to call our clinic with any problems, questions or concerns prior to return visit. I recommended to continue follow-up with PCP and sub-specialists. He verbalized understanding and agreed with the plan. No barriers to learning was detected.  Kashaun Bebo, MD [facility] A DEPT OF [facility] [address] [place] [address] Dept: [phone] Dept Fax: [phone]  03/04/2024 5:17 PM  HISTORY OF PRESENT ILLNESS:  Discussed the use of AI scribe software for clinical note transcription with the patient, who gave verbal consent to proceed.  History of Present Illness Raymond Green is a 58 year old male with chronic anemia who presents for further evaluation of his condition. He was referred by Dr. Wilhelmenia for further evaluation of anemia.  On 01/10/2024, labs showed hemoglobin of 11.9, hematocrit 37, MCV 95.  White count 4600 with normal differential.  Platelet count normal at 168,000.  Iron saturation was 30%, normal iron studies.  Ferritin normal at 58.  B12 was normal at ferritin 84, folate normal at 7.6.  Patient was referred to us  for further evaluation of normocytic anemia.  Review of records indicate chronic, mild anemia at least since January 2025 with hemoglobin at 12,  MCV normal.  Iron studies, B12 were normal in June  2025.  His last colonoscopy in July 2023 showed normal colon without evidence of polyps or other concerning findings.  Because of hematochezia, he had flexible sigmoidoscopy in April 2024 which showed external and internal hemorrhoids.  Normal mucosa in the rectosigmoid colon, sigmoid colon and descending colon.  He has a history of chronic anemia with fluctuating hemoglobin levels since at least 2015. His hemoglobin was mildly low at 10.2 g/dL in May 2024, with recent values of 12.3 g/dL and 88.0 g/dL in July. Despite normal iron and B12 levels, the cause of his anemia remains unexplained. No obvious blood loss, such as epistaxis or gum bleeding, is reported. He has undergone a colonoscopy and flexible sigmoidoscopy, which were unremarkable except for hemorrhoids without bleeding.  He follows a regular diet and denies any unusual cravings such as for ice chips. He does not take over-the-counter medications like Aleve  or Advil due to his kidney condition, using only Tylenol  for pain management. No blood in urine or burning during urination is noted, although he describes his urine as 'great for me.' He has not been on iron or vitamin supplements.  He has chronic kidney disease, stage 3, and sees a nephrologist for management. No symptoms such as chest pain, shortness of breath with activity, dizziness, or palpitations are present.  Socially, he has been living in [place] for 25 years and is originally from [place]. He works at a hospital and consumes alcohol occasionally, not regularly.   MEDICAL HISTORY:  Past Medical History:  Diagnosis Date   Allergy     Anemia    Dysrhythmia    Erosive gastritis 2016   GERD (gastroesophageal reflux disease)    Headache(784.0)    Hemorrhoids    Hyperlipidemia    Hypertension    Kidney insufficiency    LVH (left ventricular hypertrophy)    Dr. Blanca ( cardiology) palpitations   Obesity    OSA (obstructive sleep apnea)    Positive PPD, treated  2000   INH   Recurrent upper respiratory infection (URI)    Sleep apnea    uses cpap   Stroke (HCC)    mimi   Vitamin D  deficiency     SURGICAL HISTORY: Past Surgical History:  Procedure Laterality Date   COLONOSCOPY  12/28/2011   Procedure: COLONOSCOPY;  Surgeon: Toribio SHAUNNA Cedar, MD;  Location: [facility];  Service: Endoscopy;  Laterality: N/A;   COLONOSCOPY     HEMORRHOID SURGERY     NASAL SINUS SURGERY Bilateral 10/30/2022   Procedure: ENDOSCOPIC NASAL POLYPECTOMY; FRONTAL ETHMOIDECTOMY AND MAXILLARY SINUS SURGERY;  Surgeon: Jesus Oliphant, MD;  Location: [facility] OR;  Service: ENT;  Laterality: Bilateral;   SEPTOPLASTY Bilateral 07/05/2023   Procedure: SEPTOPLASTY;  Surgeon: Jesus Oliphant, MD;  Location: [facility] OR;  Service: ENT;  Laterality: Bilateral;   UPPER GASTROINTESTINAL ENDOSCOPY      SOCIAL HISTORY: He reports that he has never smoked. He has never used smokeless tobacco. He reports that he does not currently use alcohol. He reports that he does not use drugs. Social History   Socioeconomic History   Marital status: Married    Spouse name: Not on file   Number of children: 0   Years of education: Not on file   Highest education level: Not on file  Occupational History   Occupation: ENDOSCOPY TECHNICIAN    Employer: [employer]  Tobacco Use   Smoking status: Never   Smokeless tobacco: Never  Vaping Use   Vaping status: Never Used  Substance and Sexual Activity   Alcohol use: Not Currently    Comment: occasional   Drug use: Never   Sexual activity: Yes  Other Topics Concern   Not on file  Social History Narrative   Married   Education: Automotive engineer   Exercise: Yes   Social Drivers of Health   Financial Resource Strain: Not on file  Food Insecurity: No Food Insecurity (03/04/2024)   Hunger Vital Sign    Worried About Running Out of Food in the Last Year: Never true    Ran Out of Food in the Last Year: Never true  Transportation Needs: No Transportation Needs  (03/04/2024)   PRAPARE - Administrator, Civil Service (Medical): No    Lack of Transportation (Non-Medical): No  Physical Activity: Not on file  Stress: Not on file  Social Connections: Not on file  Intimate Partner Violence: Not At Risk (03/04/2024)   Humiliation, Afraid, Rape, and Kick questionnaire    Fear of Current or Ex-Partner: No    Emotionally Abused: No    Physically Abused: No    Sexually Abused: No    FAMILY HISTORY: Family History  Problem Relation Age of Onset   Diabetes Mother    Heart disease Mother    Hypertension Mother    Hypertension Father    Diabetes Sister    Colon cancer Neg Hx    Colon polyps Neg Hx    Esophageal cancer Neg Hx    Rectal cancer Neg Hx    Stomach cancer Neg Hx    Inflammatory bowel disease Neg Hx    Liver disease Neg Hx    Pancreatic cancer Neg Hx     ALLERGIES:  He is allergic to lisinopril and shrimp [shellfish allergy ].  MEDICATIONS:  Current Outpatient Medications  Medication Sig Dispense Refill   amLODipine  (NORVASC ) 5 MG tablet Take 1 tablet (5 mg total) by mouth daily. 90 tablet 1   aspirin EC 81 MG tablet Take 81 mg by mouth daily. Swallow whole.     atorvastatin  (LIPITOR) 40 MG tablet Take 1 tablet (40 mg) by mouth daily. 90 tablet 0   losartan  (COZAAR ) 100 MG tablet Take 1 tablet (100 mg total) by mouth daily. 90 tablet 1   Nebivolol  HCl (BYSTOLIC ) 20 MG TABS Take 1 tablet (20 mg total) by mouth daily. 90 tablet 1   omeprazole  (PRILOSEC) 40 MG capsule Take 1 capsule (40 mg total) by mouth daily. 90 capsule 1   spironolactone  (ALDACTONE ) 25 MG tablet Take 1 tablet (25 mg total) by mouth daily. 90 tablet 3   tamsulosin  (FLOMAX ) 0.4 MG CAPS capsule Take 1 capsule (0.4 mg total) by mouth daily. 30 capsule 3   No current facility-administered medications for this visit.    REVIEW OF SYSTEMS:    Review of Systems - Oncology  All other pertinent systems were reviewed and were negative except as mentioned  above.  PHYSICAL EXAMINATION:   Onc Performance Status - 03/04/24 1404       ECOG Perf Status   ECOG Perf Status Restricted in physically strenuous activity but ambulatory and able to carry out work of a light or sedentary nature, e.g., light house work, office work      KPS SCALE   KPS % SCORE Able to carry on normal activity, minor s/s of disease          Vitals:   03/04/24 1352  BP: 98/60  Pulse: (!) 46  Resp: 16  Temp: 98.1 F (36.7 C)  SpO2: 100%   Filed Weights   03/04/24 1352  Weight: 235 lb 3.2 oz (106.7 kg)    Physical Exam Constitutional:      General: He is not in acute distress.    Appearance: Normal appearance.  HENT:     Head: Normocephalic and atraumatic.  Eyes:     Conjunctiva/sclera: Conjunctivae normal.  Cardiovascular:     Rate and Rhythm: Normal rate and regular rhythm.     Heart sounds: Normal heart sounds.  Pulmonary:     Effort: Pulmonary effort is normal. No respiratory distress.     Breath sounds: Normal breath sounds.  Abdominal:     General: There is no distension.  Neurological:     General: No focal deficit present.     Mental Status: He is alert and oriented to person, place, and time.  Psychiatric:        Mood and Affect: Mood normal.        Behavior: Behavior normal.    LABORATORY DATA:   I have reviewed the data as listed.  Results for orders placed or performed in visit on 03/04/24  Reticulocytes  Result Value Ref Range   Retic Ct Pct 1.4 0.4 - 3.1 %   RBC. 4.03 (L) 4.22 - 5.81 MIL/uL   Retic Count, Absolute 56.8 19.0 - 186.0 K/uL   Immature Retic Fract 9.9 2.3 - 15.9 %  Lactate dehydrogenase  Result Value Ref Range   LDH 195 (H) 98 - 192 U/L  TSH  Result Value Ref Range   TSH 1.290 0.350 - 4.500 uIU/mL  CMP (Cancer Center only)  Result Value Ref Range   Sodium 138 135 - 145 mmol/L   Potassium 4.2 3.5 - 5.1 mmol/L   Chloride 102 98 - 111 mmol/L   CO2 27 22 - 32 mmol/L   Glucose, Bld 86 70 - 99 mg/dL   BUN  14 6 - 20 mg/dL   Creatinine 8.31 (H) 9.38 - 1.24 mg/dL   Calcium  10.1 8.9 - 10.3 mg/dL   Total Protein 7.6 6.5 - 8.1 g/dL   Albumin 4.4 3.5 - 5.0 g/dL   AST 29 15 - 41 U/L   ALT 17 0 - 44 U/L   Alkaline Phosphatase 65 38 - 126 U/L   Total Bilirubin 0.8 0.0 - 1.2 mg/dL   GFR, Estimated 47 (L) >60 mL/min   Anion gap 10 5 - 15  CBC with Differential (Cancer Center Only)  Result Value Ref Range   WBC Count 3.7 (L) 4.0 - 10.5 K/uL   RBC 4.08 (L) 4.22 - 5.81 MIL/uL   Hemoglobin 12.5 (L) 13.0 - 17.0 g/dL   HCT 63.0 (L) 60.9 - 47.9 %   MCV 90.4 80.0 - 100.0 fL   MCH 30.6 26.0 - 34.0 pg   MCHC 33.9 30.0 - 36.0 g/dL   RDW 87.2 88.4 - 84.4 %   Platelet Count 148 (L) 150 - 400 K/uL   nRBC 0.0 0.0 - 0.2 %   Neutrophils Relative % 48 %   Neutro Abs 1.8 1.7 - 7.7 K/uL   Lymphocytes Relative 37 %   Lymphs Abs 1.4 0.7 - 4.0 K/uL   Monocytes Relative 7 %   Monocytes Absolute 0.3 0.1 - 1.0 K/uL   Eosinophils Relative 7 %   Eosinophils Absolute 0.3 0.0 - 0.5 K/uL   Basophils Relative 1 %   Basophils Absolute 0.0  0.0 - 0.1 K/uL   Immature Granulocytes 0 %   Abs Immature Granulocytes 0.01 0.00 - 0.07 K/uL    RADIOGRAPHIC STUDIES:  No recent pertinent imaging studies available to review.  Orders Placed This Encounter  Procedures   CBC with Differential (Cancer Center Only)    Standing Status:   Future    Number of Occurrences:   1    Expiration Date:   03/04/2025   CMP (Cancer Center only)    Standing Status:   Future    Number of Occurrences:   1    Expiration Date:   03/04/2025   Methylmalonic acid, serum    Standing Status:   Future    Number of Occurrences:   1    Expiration Date:   03/04/2025   TSH    Standing Status:   Future    Number of Occurrences:   1    Expiration Date:   03/04/2025   Lactate dehydrogenase    Standing Status:   Future    Number of Occurrences:   1    Expiration Date:   03/04/2025   Haptoglobin    Standing Status:   Future    Number of Occurrences:   1     Expiration Date:   03/04/2025   Hgb Fractionation Cascade    Standing Status:   Future    Number of Occurrences:   1    Expiration Date:   03/04/2025   ANA w/Reflex if Positive    Standing Status:   Future    Number of Occurrences:   1    Expiration Date:   03/04/2025   Reticulocytes    Standing Status:   Future    Number of Occurrences:   1    Expiration Date:   03/04/2025   Kappa/lambda light chains    Standing Status:   Future    Number of Occurrences:   1    Expiration Date:   03/04/2025   Multiple Myeloma Panel (SPEP&IFE w/QIG)    Standing Status:   Future    Number of Occurrences:   1    Expiration Date:   03/04/2025   Direct antiglobulin test (not at [facility])    Standing Status:   Future    Number of Occurrences:   1    Expiration Date:   03/04/2025    Future Appointments  Date Time Provider Department Center  05/05/2024  3:00 PM Levora Reyes SAUNDERS, MD [facility]  05/26/2024  3:45 PM McCue, Harlene, NP [facility] None     I spent a total of 55 minutes during this encounter with the patient including review of chart and various tests results, discussions about plan of care and coordination of care plan.  This document was completed utilizing speech recognition software. Grammatical errors, random word insertions, pronoun errors, and incomplete sentences are an occasional consequence of this system due to software limitations, ambient noise, and hardware issues. Any formal questions or concerns about the content, text or information contained within the body of this dictation should be directly addressed to the provider for clarification.

## 2024-03-05 ENCOUNTER — Telehealth: Payer: Self-pay

## 2024-03-05 ENCOUNTER — Telehealth: Payer: Self-pay | Admitting: Oncology

## 2024-03-05 LAB — ANA W/REFLEX IF POSITIVE: Anti Nuclear Antibody (ANA): NEGATIVE

## 2024-03-05 LAB — KAPPA/LAMBDA LIGHT CHAINS
Kappa free light chain: 35.9 mg/L — ABNORMAL HIGH (ref 3.3–19.4)
Kappa, lambda light chain ratio: 1.46 (ref 0.26–1.65)
Lambda free light chains: 24.6 mg/L (ref 5.7–26.3)

## 2024-03-05 LAB — HAPTOGLOBIN: Haptoglobin: 66 mg/dL (ref 29–370)

## 2024-03-05 NOTE — Telephone Encounter (Signed)
 Responded to patient's message for lab results explanation that was from yesterday. Patient did have a lot of labs done. Only some have resulted for now the following was left on his voicemail per Dr. Autumn:  Whatever labs that have resulted, so far, are overall stable. He does have slightly low white count which is a chronic issue. Hemoglobin is closer to his baseline.   Furthermore, this was also shared with patient, There is additional workup that is pending and Dr. Autumn said he will discuss all of this on phone visit, appointment at end of month.

## 2024-03-05 NOTE — Telephone Encounter (Signed)
 Patient has been scheduled for follow-up visit per 03/04/24 LOS.  LVM notifying pt of appt details, provided my direct number to pt if appt changes need to be made.

## 2024-03-06 LAB — HGB FRACTIONATION CASCADE
Hgb A2: 3 % (ref 1.8–3.2)
Hgb A: 97 % (ref 96.4–98.8)
Hgb F: 0 % (ref 0.0–2.0)
Hgb S: 0 %

## 2024-03-07 LAB — MULTIPLE MYELOMA PANEL, SERUM
Albumin SerPl Elph-Mcnc: 3.7 g/dL (ref 2.9–4.4)
Albumin/Glob SerPl: 1.2 (ref 0.7–1.7)
Alpha 1: 0.2 g/dL (ref 0.0–0.4)
Alpha2 Glob SerPl Elph-Mcnc: 0.5 g/dL (ref 0.4–1.0)
B-Globulin SerPl Elph-Mcnc: 1.1 g/dL (ref 0.7–1.3)
Gamma Glob SerPl Elph-Mcnc: 1.6 g/dL (ref 0.4–1.8)
Globulin, Total: 3.3 g/dL (ref 2.2–3.9)
IgA: 218 mg/dL (ref 90–386)
IgG (Immunoglobin G), Serum: 1551 mg/dL (ref 603–1613)
IgM (Immunoglobulin M), Srm: 95 mg/dL (ref 20–172)
Total Protein ELP: 7 g/dL (ref 6.0–8.5)

## 2024-03-09 LAB — METHYLMALONIC ACID, SERUM: Methylmalonic Acid, Quantitative: 139 nmol/L (ref 0–378)

## 2024-03-17 ENCOUNTER — Encounter: Payer: Self-pay | Admitting: Oncology

## 2024-03-17 ENCOUNTER — Inpatient Hospital Stay: Admitting: Oncology

## 2024-03-17 DIAGNOSIS — D649 Anemia, unspecified: Secondary | ICD-10-CM | POA: Diagnosis not present

## 2024-03-17 NOTE — Progress Notes (Signed)
 [facility]  HEMATOLOGY-ONCOLOGY ELECTRONIC VISIT PROGRESS NOTE  PATIENT NAME: Raymond Green   MR#: [id number] DOB: October 13, 1965  DATE OF SERVICE: 03/17/2024  Patient Care Team: Levora Reyes SAUNDERS, MD as PCP - General (Family Medicine) Starla Laymon BIRCH PA-C as Physician Assistant (Physician Assistant) Jesus Oliphant, MD as Consulting Physician (Otolaryngology) Gearline Norris, MD as Consulting Physician (Nephrology) Mansouraty, Aloha Raddle., MD as Consulting Physician (Gastroenterology)  I connected with the patient via telephone conference and verified that I am speaking with the correct person using two identifiers. The patient's location is at home and I am providing care from the [facility].  I discussed the limitations, risks, security and privacy concerns of performing an evaluation and management service by e-visits and the availability of in person appointments. I also discussed with the patient that there may be a patient responsible charge related to this service. The patient expressed understanding and agreed to proceed.   ASSESSMENT & PLAN:   Raymond Green is a 58 y.o. gentleman with a past medical history of hypertension, dyslipidemia, CKD stage III, sleep apnea, was referred to our service in September 2025 for evaluation of normocytic anemia.    Normocytic anemia Anemia has been present since at least 2015 with fluctuating hemoglobin levels, currently around 11.8 to 12.3 g/dL.   Previous workup including iron and B12 levels were normal. No obvious sources of blood loss identified.   Chronic kidney disease stage 3 may contribute to anemia due to reduced erythropoietin production.    On his consultation with us  on 03/04/2024, labs showed stable hemoglobin of 12.5, hematocrit 36.9, MCV 90.4.  White count 3700 with normal differential.  Platelet count 148,000.  Creatinine 1.68, otherwise unremarkable CMP.  TSH, LDH were within normal limits.   Methylmalonic acid, haptoglobin were within normal limits.  Coombs test negative.  SPEP showed no evidence of M spike.  IFE was unremarkable.  Quantitative immunoglobulins were within normal limits.  Serum free kappa was slightly increased at 35.9 mg/L, lambda was 24.6 mg/L, ratio normal at 1.46.  Overall no evidence of monoclonal gammopathy.  ANA negative. Hemoglobin electrophoresis was unremarkable.    Clinical picture is indicative of anemia of chronic disease related to CKD. Erythropoietin injections are considered if hemoglobin consistently falls below 9 g/dL.    No intervention needed at current values.  He was provided reassurance.    I discussed the assessment and treatment plan with the patient. The patient was provided an opportunity to ask questions and all were answered. The patient agreed with the plan and demonstrated an understanding of the instructions. The patient was advised to call back or seek an in-person evaluation if the symptoms worsen or if the condition fails to improve as anticipated.    I spent 11 minutes over the phone with the patient reviewing test results, discuss management and coordination/planning of care.  Chinita Patten, MD 03/17/2024 4:13 PM [facility] - A DEPT OF [facility] [address] [place] [address] Dept: [phone] Dept Fax: [phone]   INTERVAL HISTORY:  Please see above for problem oriented charting.  The purpose of today's discussion is to explain recent lab results and to formulate plan of care.  Discussed the use of AI scribe software for clinical note transcription with the patient, who gave verbal consent to proceed.  History of Present Illness Raymond Green is a 58 year old male with chronic kidney disease  who presents for follow-up of mild anemia.  He has stable hemoglobin levels at 12.5 g/dL. His white blood cell count is 3,700  cells/mcL, with previous values ranging from 3,600 to 4,500 cells/mcL. Creatinine levels are 1.68 mg/dL, similar to past values.  He is under the care of a nephrologist for chronic kidney disease. Recent tests, including methylmalonic acid for B12 utilization and bone marrow evaluation, were normal. Thyroid  function tests and a Coombs test for autoimmune hemolysis were negative.  He is concerned about his red blood cell count and hemoglobin levels.    SUMMARY OF HEMATOLOGY HISTORY:  He was referred by Dr. Wilhelmenia for further evaluation of anemia.   On 01/10/2024, labs showed hemoglobin of 11.9, hematocrit 37, MCV 95.  White count 4600 with normal differential.  Platelet count normal at 168,000.  Iron saturation was 30%, normal iron studies.  Ferritin normal at 58.  B12 was normal at ferritin 84, folate normal at 7.6.  Patient was referred to us  for further evaluation of normocytic anemia.   Review of records indicate chronic, mild anemia at least since January 2025 with hemoglobin at 12, MCV normal.  Iron studies, B12 were normal in June 2025.   His last colonoscopy in July 2023 showed normal colon without evidence of polyps or other concerning findings.  Because of hematochezia, he had flexible sigmoidoscopy in April 2024 which showed external and internal hemorrhoids.  Normal mucosa in the rectosigmoid colon, sigmoid colon and descending colon.   He has a history of chronic anemia with fluctuating hemoglobin levels since at least 2015. His hemoglobin was mildly low at 10.2 g/dL in May 2024, with recent values of 12.3 g/dL and 88.0 g/dL in July. Despite normal iron and B12 levels, the cause of his anemia remains unexplained. No obvious blood loss, such as epistaxis or gum bleeding, is reported. He has undergone a colonoscopy and flexible sigmoidoscopy, which were unremarkable except for hemorrhoids without bleeding.   He follows a regular diet and denies any unusual cravings such as for ice  chips. He does not take over-the-counter medications like Aleve  or Advil due to his kidney condition, using only Tylenol  for pain management. No blood in urine or burning during urination is noted, although he describes his urine as 'great for me.' He has not been on iron or vitamin supplements.   He has chronic kidney disease, stage 3, and sees a nephrologist for management. No symptoms such as chest pain, shortness of breath with activity, dizziness, or palpitations are present.   Socially, he has been living in [place] for 25 years and is originally from [place]. He works at the [employer] (with Dr. Wilhelmenia). He consumes alcohol occasionally, not regularly.  Anemia has been present since at least 2015 with fluctuating hemoglobin levels, currently around 11.8 to 12.3 g/dL.    Previous workup including iron and B12 levels were normal. No obvious sources of blood loss identified.    Chronic kidney disease stage 3 may contribute to anemia due to reduced erythropoietin production.    On his consultation with us  on 03/04/2024, labs showed stable hemoglobin of 12.5, hematocrit 36.9, MCV 90.4.  White count 3700 with normal differential.  Platelet count 148,000.  Creatinine 1.68, otherwise unremarkable CMP.  TSH, LDH were within normal limits.  Methylmalonic acid, haptoglobin were within normal limits.  Coombs test negative.  SPEP showed no evidence of M spike.  IFE was unremarkable.  Quantitative immunoglobulins were within normal limits.  Serum free kappa was  slightly increased at 35.9 mg/L, lambda was 24.6 mg/L, ratio normal at 1.46.  Overall no evidence of monoclonal gammopathy.  ANA negative. Hemoglobin electrophoresis was unremarkable.    Clinical picture is indicative of anemia of chronic disease related to CKD. Erythropoietin injections are considered if hemoglobin consistently falls below 9 g/dL.    No intervention needed at current values.  He was provided reassurance.     REVIEW OF SYSTEMS:    Review of Systems - Oncology  All other pertinent systems were reviewed with the patient and are negative.  I have reviewed the past medical history, past surgical history, social history and family history with the patient and they are unchanged from previous note.  ALLERGIES:  He is allergic to lisinopril and shrimp [shellfish allergy ].  MEDICATIONS:  Current Outpatient Medications  Medication Sig Dispense Refill   amLODipine  (NORVASC ) 5 MG tablet Take 1 tablet (5 mg total) by mouth daily. 90 tablet 1   aspirin EC 81 MG tablet Take 81 mg by mouth daily. Swallow whole.     atorvastatin  (LIPITOR) 40 MG tablet Take 1 tablet (40 mg) by mouth daily. 90 tablet 0   losartan  (COZAAR ) 100 MG tablet Take 1 tablet (100 mg total) by mouth daily. 90 tablet 1   Nebivolol  HCl (BYSTOLIC ) 20 MG TABS Take 1 tablet (20 mg total) by mouth daily. 90 tablet 1   omeprazole  (PRILOSEC) 40 MG capsule Take 1 capsule (40 mg total) by mouth daily. 90 capsule 1   spironolactone  (ALDACTONE ) 25 MG tablet Take 1 tablet (25 mg total) by mouth daily. 90 tablet 3   tamsulosin  (FLOMAX ) 0.4 MG CAPS capsule Take 1 capsule (0.4 mg total) by mouth daily. 30 capsule 3   No current facility-administered medications for this visit.    PHYSICAL EXAMINATION:    Onc Performance Status - 03/17/24 1500       ECOG Perf Status   ECOG Perf Status Restricted in physically strenuous activity but ambulatory and able to carry out work of a light or sedentary nature, e.g., light house work, office work      KPS SCALE   KPS % SCORE Able to carry on normal activity, minor s/s of disease          LABORATORY DATA:   I have reviewed the data as listed.  Recent Results (from the past 2160 hours)  Lab report - scanned     Status: None   Collection Time: 01/15/24 12:00 AM  Result Value Ref Range   EGFR 51.0     Comment: ABSTRACTED BY HIM  Multiple Myeloma Panel (SPEP&IFE w/QIG)     Status: None    Collection Time: 03/04/24  2:50 PM  Result Value Ref Range   IgG (Immunoglobin G), Serum 1,551 603 - 1,613 mg/dL   IgA 781 90 - 613 mg/dL   IgM (Immunoglobulin M), Srm 95 20 - 172 mg/dL   Total Protein ELP 7.0 6.0 - 8.5 g/dL   Albumin SerPl Elph-Mcnc 3.7 2.9 - 4.4 g/dL   Alpha 1 0.2 0.0 - 0.4 g/dL   Alpha2 Glob SerPl Elph-Mcnc 0.5 0.4 - 1.0 g/dL   B-Globulin SerPl Elph-Mcnc 1.1 0.7 - 1.3 g/dL   Gamma Glob SerPl Elph-Mcnc 1.6 0.4 - 1.8 g/dL   M Protein SerPl Elph-Mcnc Not Observed Not Observed g/dL   Globulin, Total 3.3 2.2 - 3.9 g/dL   Albumin/Glob SerPl 1.2 0.7 - 1.7   IFE 1 Comment     Comment: (NOTE) The immunofixation  pattern appears unremarkable. Evidence of monoclonal protein is not apparent.    Please Note Comment     Comment: (NOTE) Protein electrophoresis scan will follow via computer, mail, or courier delivery. Performed At: [facility] [address] [place], [place] [id number] Jennette Shorter MD Ey:[id number]   Kappa/lambda light chains     Status: Abnormal   Collection Time: 03/04/24  2:50 PM  Result Value Ref Range   Kappa free light chain 35.9 (H) 3.3 - 19.4 mg/L   Lambda free light chains 24.6 5.7 - 26.3 mg/L   Kappa, lambda light chain ratio 1.46 0.26 - 1.65    Comment: (NOTE) Performed At: [facility] [address] [place], [place] [id number] Jennette Shorter MD Ey:[id number]   Reticulocytes     Status: Abnormal   Collection Time: 03/04/24  2:50 PM  Result Value Ref Range   Retic Ct Pct 1.4 0.4 - 3.1 %   RBC. 4.03 (L) 4.22 - 5.81 MIL/uL   Retic Count, Absolute 56.8 19.0 - 186.0 K/uL   Immature Retic Fract 9.9 2.3 - 15.9 %    Comment: Performed at [facility], [address] [place], [place] [address]  ANA w/Reflex if Positive     Status: None   Collection Time: 03/04/24  2:50 PM  Result Value Ref Range   Anti Nuclear Antibody (ANA) Negative Negative    Comment: (NOTE) Performed At: [facility] [address] [place], [place] [id number] Jennette Shorter MD Ey:[id number]   Hgb Fractionation Cascade     Status: None   Collection Time: 03/04/24  2:50 PM  Result Value Ref Range   Hgb F 0.0 0.0 - 2.0 %   Hgb A 97.0 96.4 - 98.8 %   Hgb A2 3.0 1.8 - 3.2 %   Hgb S 0.0 0.0 %   Interpretation, Hgb Fract Comment     Comment: (NOTE) Normal hemoglobin present; no hemoglobin variant or beta thalassemia identified. Note: Alpha thalassemia may not be detected by the Hgb Fractionation Cascade panel. If alpha thalassemia is suspected, [facility] offers Alpha-Thalassemia DNA Analysis [phone]). Performed At: [facility] [address] [place], [place] [id number] Jennette Shorter MD Ey:[id number]   Direct antiglobulin test (not at [facility])     Status: None   Collection Time: 03/04/24  2:50 PM  Result Value Ref Range   DAT, complement NEG    DAT, IgG      NEG Performed at [facility], [address] [place], [place] [address]   Haptoglobin     Status: None   Collection Time: 03/04/24  2:50 PM  Result Value Ref Range   Haptoglobin 66 29 - 370 mg/dL    Comment: (NOTE) Performed At: [facility] [address] [place], [place] [id number] Jennette Shorter MD Ey:[id number]   Lactate dehydrogenase     Status: Abnormal   Collection Time: 03/04/24  2:50 PM  Result Value Ref Range   LDH 195 (H) 98 - 192 U/L    Comment: Performed at [facility], [address] [place], [place] [address]  TSH     Status: None   Collection Time: 03/04/24  2:50 PM  Result Value Ref Range   TSH 1.290 0.350 - 4.500 uIU/mL    Comment: Performed at [facility], [address], [place], [place] [address]  Methylmalonic acid, serum     Status: None   Collection Time: 03/04/24  2:50 PM  Result Value Ref Range   Methylmalonic  Acid, Quantitative 139 0 - 378 nmol/L    Comment: (NOTE) This test was developed and its performance  characteristics determined by [facility]. It has not been cleared or approved by the Food and Drug Administration. Performed At: [facility] [address] [place], [place] [id number] Jennette Shorter MD Ey:[id number]   CMP (Cancer Center only)     Status: Abnormal   Collection Time: 03/04/24  2:50 PM  Result Value Ref Range   Sodium 138 135 - 145 mmol/L   Potassium 4.2 3.5 - 5.1 mmol/L   Chloride 102 98 - 111 mmol/L   CO2 27 22 - 32 mmol/L   Glucose, Bld 86 70 - 99 mg/dL    Comment: Glucose reference range applies only to samples taken after fasting for at least 8 hours.   BUN 14 6 - 20 mg/dL   Creatinine 8.31 (H) 9.38 - 1.24 mg/dL   Calcium  10.1 8.9 - 10.3 mg/dL   Total Protein 7.6 6.5 - 8.1 g/dL   Albumin 4.4 3.5 - 5.0 g/dL   AST 29 15 - 41 U/L   ALT 17 0 - 44 U/L   Alkaline Phosphatase 65 38 - 126 U/L   Total Bilirubin 0.8 0.0 - 1.2 mg/dL   GFR, Estimated 47 (L) >60 mL/min    Comment: (NOTE) Calculated using the CKD-EPI Creatinine Equation (2021)    Anion gap 10 5 - 15    Comment: Performed at [facility], [address], [place], [place] [address]  CBC with Differential (Cancer Center Only)     Status: Abnormal   Collection Time: 03/04/24  2:50 PM  Result Value Ref Range   WBC Count 3.7 (L) 4.0 - 10.5 K/uL   RBC 4.08 (L) 4.22 - 5.81 MIL/uL   Hemoglobin 12.5 (L) 13.0 - 17.0 g/dL   HCT 63.0 (L) 60.9 - 47.9 %   MCV 90.4 80.0 - 100.0 fL   MCH 30.6 26.0 - 34.0 pg   MCHC 33.9 30.0 - 36.0 g/dL   RDW 87.2 88.4 - 84.4 %   Platelet Count 148 (L) 150 - 400 K/uL    Comment: SPECIMEN CHECKED FOR CLOTS REPEATED TO VERIFY    nRBC 0.0 0.0 - 0.2 %   Neutrophils Relative % 48 %   Neutro Abs 1.8 1.7 - 7.7 K/uL   Lymphocytes Relative 37 %   Lymphs Abs 1.4 0.7 - 4.0 K/uL   Monocytes Relative 7 %   Monocytes Absolute 0.3 0.1 - 1.0 K/uL   Eosinophils Relative 7 %   Eosinophils Absolute 0.3 0.0 - 0.5 K/uL   Basophils Relative 1 %   Basophils Absolute 0.0  0.0 - 0.1 K/uL   Immature Granulocytes 0 %   Abs Immature Granulocytes 0.01 0.00 - 0.07 K/uL    Comment: Performed at [facility], [address] [place], [place] [address]     RADIOGRAPHIC STUDIES:  No recent pertinent imaging studies available to review.  No orders of the defined types were placed in this encounter.    Future Appointments  Date Time Provider Department Center  05/05/2024  3:00 PM Levora Reyes SAUNDERS, MD [facility] [place]  05/26/2024  3:45 PM Whitfield Raisin, NP [facility] None  07/01/2024  3:15 PM [facility] PHLEBOTOMIST [facility] None  07/01/2024  3:30 PM Emila Steinhauser, Chinita, MD [facility] None    This document was completed utilizing speech recognition software. Grammatical errors, random word insertions, pronoun errors, and incomplete sentences are an occasional consequence of this system due to  software limitations, ambient noise, and hardware issues. Any formal questions or concerns about the content, text or information contained within the body of this dictation should be directly addressed to the provider for clarification.

## 2024-03-17 NOTE — Assessment & Plan Note (Signed)
 Anemia has been present since at least 2015 with fluctuating hemoglobin levels, currently around 11.8 to 12.3 g/dL.   Previous workup including iron and B12 levels were normal. No obvious sources of blood loss identified.   Chronic kidney disease stage 3 may contribute to anemia due to reduced erythropoietin production.    On his consultation with us  on 03/04/2024, labs showed stable hemoglobin of 12.5, hematocrit 36.9, MCV 90.4.  White count 3700 with normal differential.  Platelet count 148,000.  Creatinine 1.68, otherwise unremarkable CMP.  TSH, LDH were within normal limits.  Methylmalonic acid, haptoglobin were within normal limits.  Coombs test negative.  SPEP showed no evidence of M spike.  IFE was unremarkable.  Quantitative immunoglobulins were within normal limits.  Serum free kappa was slightly increased at 35.9 mg/L, lambda was 24.6 mg/L, ratio normal at 1.46.  Overall no evidence of monoclonal gammopathy.  ANA negative. Hemoglobin electrophoresis was unremarkable.    Clinical picture is indicative of anemia of chronic disease related to CKD. Erythropoietin injections are considered if hemoglobin consistently falls below 9 g/dL.    No intervention needed at current values.  He was provided reassurance.

## 2024-03-26 ENCOUNTER — Other Ambulatory Visit (HOSPITAL_COMMUNITY): Payer: Self-pay

## 2024-03-26 ENCOUNTER — Other Ambulatory Visit: Payer: Self-pay | Admitting: Family Medicine

## 2024-03-26 MED ORDER — ATORVASTATIN CALCIUM 40 MG PO TABS
40.0000 mg | ORAL_TABLET | Freq: Every day | ORAL | 0 refills | Status: DC
  Filled 2024-03-26: qty 90, 90d supply, fill #0

## 2024-05-05 ENCOUNTER — Ambulatory Visit (INDEPENDENT_AMBULATORY_CARE_PROVIDER_SITE_OTHER): Admitting: Family Medicine

## 2024-05-05 ENCOUNTER — Other Ambulatory Visit: Payer: Self-pay

## 2024-05-05 ENCOUNTER — Other Ambulatory Visit (HOSPITAL_COMMUNITY): Payer: Self-pay

## 2024-05-05 ENCOUNTER — Encounter: Payer: Self-pay | Admitting: Family Medicine

## 2024-05-05 VITALS — BP 122/86 | HR 56 | Temp 98.0°F | Resp 16 | Ht 70.25 in | Wt 230.8 lb

## 2024-05-05 DIAGNOSIS — Z8673 Personal history of transient ischemic attack (TIA), and cerebral infarction without residual deficits: Secondary | ICD-10-CM

## 2024-05-05 DIAGNOSIS — M549 Dorsalgia, unspecified: Secondary | ICD-10-CM | POA: Diagnosis not present

## 2024-05-05 DIAGNOSIS — G4733 Obstructive sleep apnea (adult) (pediatric): Secondary | ICD-10-CM | POA: Diagnosis not present

## 2024-05-05 DIAGNOSIS — M542 Cervicalgia: Secondary | ICD-10-CM | POA: Diagnosis not present

## 2024-05-05 DIAGNOSIS — E785 Hyperlipidemia, unspecified: Secondary | ICD-10-CM

## 2024-05-05 DIAGNOSIS — I10 Essential (primary) hypertension: Secondary | ICD-10-CM

## 2024-05-05 DIAGNOSIS — E559 Vitamin D deficiency, unspecified: Secondary | ICD-10-CM | POA: Diagnosis not present

## 2024-05-05 MED ORDER — CYCLOBENZAPRINE HCL 5 MG PO TABS
5.0000 mg | ORAL_TABLET | Freq: Three times a day (TID) | ORAL | 1 refills | Status: AC | PRN
  Filled 2024-05-05: qty 15, 5d supply, fill #0

## 2024-05-05 NOTE — Progress Notes (Unsigned)
 Subjective:  Patient ID: Raymond Green, male    DOB: 09/27/1965  Age: 58 y.o. MRN: [id number]  CC:  Chief Complaint  Patient presents with  . Hyperlipidemia    Would like to have blood work done today  . Back Pain    Upper back is hurting him mostly in the morning. No known injury. Aching pain. Sx started 1-2 months ago.     HPI Raymond CIRESI presents for   Hypertension: With history of CKD followed by nephrology previously.  Dr. Gearline.  Suspect benign mild CKD due to hypertension, workup for secondary hypertension was negative.  History of MCA, right sided arterial ischemic stroke, lacunar stroke noted by neuro.  Hypertension treated with losartan  100 mg daily, amlodipine  5 mg daily, spironolactone  25 mg daily, Bystolic  20 mg daily and on aspirin 81 mg daily. No new bleeding or med side effects. Anemia of chronic disease with CKD, plan for EPO if HGB below 9 - hematology - Dr. Autumn.  Home readings: similar - 120-130/80 range usually.  BP Readings from Last 3 Encounters:  05/05/24 122/86  03/04/24 98/60  11/05/23 120/72   Lab Results  Component Value Date   CREATININE 1.68 (H) 03/04/2024   Vitamin D  deficiency Previous low reading in November of last year, recommended over-the-counter supplement. Taking 1000units per day.  Last vitamin D  Lab Results  Component Value Date   VD25OH 19.47 (L) 05/17/2016   Hyperlipidemia: Lipitor 40 mg daily, history of lacunar CVA as above.  LDL was good at 62 back in May. No myalgias/side effects.  Left arm feels weak at times - past month with the upper back pain. No persistent weakness or new HA.  Lab Results  Component Value Date   CHOL 128 11/05/2023   HDL 48.00 11/05/2023   LDLCALC 62 11/05/2023   TRIG 88.0 11/05/2023   CHOLHDL 3 11/05/2023   Lab Results  Component Value Date   ALT 17 03/04/2024   AST 29 03/04/2024   ALKPHOS 65 03/04/2024   BILITOT 0.8 03/04/2024   Upper back pain.  Past 1-2 months. Left arm feels  weak at time during the day  - intermittent. No persistent weakness. Some neck tightness, but no burning/stinging in arm.  NKI. Upper back sore in am. Loosens up during day - asx currently.  Tx: none.   History Patient Active Problem List   Diagnosis Date Noted  . Normocytic anemia 11/10/2023  . Severe obstructive sleep apnea 05/22/2023  . Atherosclerosis of native artery of left lower extremity with intermittent claudication 12/19/2022  . Old lacunar stroke without late effect 11/29/2022  . Chronic rhinosinusitis with multiple nasal polyps 11/29/2022  . Chronic rhinitis 11/23/2020  . Deviated septum 11/23/2020  . Mild coronary artery disease 02/12/2019  . Mixed hyperlipidemia 01/14/2019  . Angina pectoris 01/13/2019  . Palpitations 01/13/2019  . Low back pain 07/13/2016  . Abdominal pain, left lower quadrant 05/17/2016  . Elevated serum creatinine 05/29/2015  . Gout 10/29/2013  . Primary hypertension 03/02/2008  . GERD 03/02/2008  . Iron deficiency anemia 12/13/2007  . External hemorrhoids 12/13/2007   Past Medical History:  Diagnosis Date  . Allergy    . Anemia   . Dysrhythmia   . Erosive gastritis 2016  . GERD (gastroesophageal reflux disease)   . Headache(784.0)   . Hemorrhoids   . Hyperlipidemia   . Hypertension   . Kidney insufficiency   . LVH (left ventricular hypertrophy)    Dr. Blanca ( cardiology) palpitations  .  Obesity   . OSA (obstructive sleep apnea)   . Positive PPD, treated 2000   INH  . Recurrent upper respiratory infection (URI)   . Sleep apnea    uses cpap  . Stroke (HCC)    mimi  . Vitamin D  deficiency    Past Surgical History:  Procedure Laterality Date  . COLONOSCOPY  12/28/2011   Procedure: COLONOSCOPY;  Surgeon: Toribio SHAUNNA Cedar, MD;  Location: [facility];  Service: Endoscopy;  Laterality: N/A;  . COLONOSCOPY    . HEMORRHOID SURGERY    . NASAL SINUS SURGERY Bilateral 10/30/2022   Procedure: ENDOSCOPIC NASAL POLYPECTOMY; FRONTAL  ETHMOIDECTOMY AND MAXILLARY SINUS SURGERY;  Surgeon: Jesus Oliphant, MD;  Location: [facility] OR;  Service: ENT;  Laterality: Bilateral;  . SEPTOPLASTY Bilateral 07/05/2023   Procedure: SEPTOPLASTY;  Surgeon: Jesus Oliphant, MD;  Location: [facility] OR;  Service: ENT;  Laterality: Bilateral;  . UPPER GASTROINTESTINAL ENDOSCOPY     Allergies  Allergen Reactions  . Lisinopril     angioedema  . Shrimp [Shellfish Allergy ] Nausea And Vomiting   Prior to Admission medications   Medication Sig Start Date End Date Taking? Authorizing Provider  amLODipine  (NORVASC ) 5 MG tablet Take 1 tablet (5 mg total) by mouth daily. 10/29/23  Yes Levora Reyes SAUNDERS, MD  aspirin EC 81 MG tablet Take 81 mg by mouth daily. Swallow whole.   Yes [provider]  atorvastatin  (LIPITOR) 40 MG tablet Take 1 tablet (40 mg) by mouth daily. 03/26/24  Yes Levora Reyes SAUNDERS, MD  losartan  (COZAAR ) 100 MG tablet Take 1 tablet (100 mg total) by mouth daily. 10/29/23  Yes Levora Reyes SAUNDERS, MD  Nebivolol  HCl (BYSTOLIC ) 20 MG TABS Take 1 tablet (20 mg total) by mouth daily. 10/29/23  Yes Levora Reyes SAUNDERS, MD  omeprazole  (PRILOSEC) 40 MG capsule Take 1 capsule (40 mg total) by mouth daily. 10/29/23  Yes Levora Reyes SAUNDERS, MD  spironolactone  (ALDACTONE ) 25 MG tablet Take 1 tablet (25 mg total) by mouth daily. 10/29/23  Yes   tamsulosin  (FLOMAX ) 0.4 MG CAPS capsule Take 1 capsule (0.4 mg total) by mouth daily. 11/05/23  Yes Levora Reyes SAUNDERS, MD   Social History   Socioeconomic History  . Marital status: Married    Spouse name: Not on file  . Number of children: 0  . Years of education: Not on file  . Highest education level: Not on file  Occupational History  . Occupation: ENDOSCOPY TECHNICIAN    Employer: [employer]  Tobacco Use  . Smoking status: Never  . Smokeless tobacco: Never  Vaping Use  . Vaping status: Never Used  Substance and Sexual Activity  . Alcohol use: Not Currently    Comment: occasional  . Drug use: Never  . Sexual  activity: Yes  Other Topics Concern  . Not on file  Social History Narrative   Married   Education: Automotive Engineer   Exercise: Yes   Social Drivers of Health   Financial Resource Strain: Not on file  Food Insecurity: No Food Insecurity (03/04/2024)   Hunger Vital Sign   . Worried About Programme Researcher, Broadcasting/film/video in the Last Year: Never true   . Ran Out of Food in the Last Year: Never true  Transportation Needs: No Transportation Needs (03/04/2024)   PRAPARE - Transportation   . Lack of Transportation (Medical): No   . Lack of Transportation (Non-Medical): No  Physical Activity: Not on file  Stress: Not on file  Social Connections: Not on file  Intimate Partner Violence: Not At Risk (03/04/2024)   Humiliation, Afraid, Rape, and Kick questionnaire   . Fear of Current or Ex-Partner: No   . Emotionally Abused: No   . Physically Abused: No   . Sexually Abused: No    Review of Systems   Objective:   Vitals:   05/05/24 1515  BP: 122/86  Pulse: (!) 56  Resp: 16  Temp: 98 F (36.7 C)  TempSrc: Temporal  SpO2: 100%  Weight: 230 lb 12.8 oz (104.7 kg)  Height: 5' 10.25 (1.784 m)     Physical Exam     Assessment & Plan:  BUBBA VANBENSCHOTEN is a 58 y.o. male . No diagnosis found.   No orders of the defined types were placed in this encounter.  There are no Patient Instructions on file for this visit.    Signed,   Reyes Pines, MD [facility], [facility] 05/05/24 3:38 PM

## 2024-05-05 NOTE — Patient Instructions (Addendum)
 Please have x-ray performed at the [facility] location below for your neck.  I suspect that is causing some of your upper back and neck pain and possibly of the left arm symptoms.  If any persistent weakness to be seen right away.  Muscle relaxant at bedtime can be helpful, gentle range of motion, stretches throughout the day may also be helpful.  If any concerns on x-ray I will let you know.  Tylenol  is fine to use for now if needed.  Recheck in 1 month, sooner if worsening symptoms.  If any concerns on labs I will let you know.  No med changes for now.  Take care!  [facility] or xray: Walk in 8:30-4:30 during weekdays, no appointment needed [address]  [place], [place] [id number]

## 2024-05-06 ENCOUNTER — Other Ambulatory Visit (HOSPITAL_COMMUNITY): Payer: Self-pay

## 2024-05-06 LAB — COMPREHENSIVE METABOLIC PANEL WITH GFR
ALT: 16 U/L (ref 0–53)
AST: 23 U/L (ref 0–37)
Albumin: 4.3 g/dL (ref 3.5–5.2)
Alkaline Phosphatase: 52 U/L (ref 39–117)
BUN: 13 mg/dL (ref 6–23)
CO2: 28 meq/L (ref 19–32)
Calcium: 9.7 mg/dL (ref 8.4–10.5)
Chloride: 105 meq/L (ref 96–112)
Creatinine, Ser: 1.39 mg/dL (ref 0.40–1.50)
GFR: 56.08 mL/min — ABNORMAL LOW (ref >60.00)
Glucose, Bld: 79 mg/dL (ref 70–99)
Potassium: 4.6 meq/L (ref 3.5–5.1)
Sodium: 139 meq/L (ref 135–145)
Total Bilirubin: 0.6 mg/dL (ref 0.2–1.2)
Total Protein: 7.4 g/dL (ref 6.0–8.3)

## 2024-05-06 LAB — LIPID PANEL
Cholesterol: 137 mg/dL (ref 0–200)
HDL: 46.4 mg/dL (ref >39.00)
LDL Cholesterol: 77 mg/dL (ref 0–99)
NonHDL: 90.13
Total CHOL/HDL Ratio: 3
Triglycerides: 67 mg/dL (ref 0.0–149.0)
VLDL: 13.4 mg/dL (ref 0.0–40.0)

## 2024-05-06 LAB — VITAMIN D 25 HYDROXY (VIT D DEFICIENCY, FRACTURES): VITD: 30.8 ng/mL (ref 30.00–100.00)

## 2024-05-11 ENCOUNTER — Ambulatory Visit: Payer: Self-pay | Admitting: Family Medicine

## 2024-05-19 ENCOUNTER — Ambulatory Visit (INDEPENDENT_AMBULATORY_CARE_PROVIDER_SITE_OTHER)
Admission: RE | Admit: 2024-05-19 | Discharge: 2024-05-19 | Disposition: A | Source: Ambulatory Visit | Attending: Family Medicine | Admitting: Family Medicine

## 2024-05-19 DIAGNOSIS — M47812 Spondylosis without myelopathy or radiculopathy, cervical region: Secondary | ICD-10-CM | POA: Diagnosis not present

## 2024-05-19 DIAGNOSIS — M5030 Other cervical disc degeneration, unspecified cervical region: Secondary | ICD-10-CM | POA: Diagnosis not present

## 2024-05-19 DIAGNOSIS — M542 Cervicalgia: Secondary | ICD-10-CM | POA: Diagnosis not present

## 2024-05-19 DIAGNOSIS — G8929 Other chronic pain: Secondary | ICD-10-CM | POA: Diagnosis not present

## 2024-05-19 DIAGNOSIS — M549 Dorsalgia, unspecified: Secondary | ICD-10-CM

## 2024-05-23 NOTE — Progress Notes (Signed)
 [facility] [address] [place]. [place] [address] [phone]       OFFICE FOLLOW UP NOTE  [name] Date of Birth:  26-Feb-1966 Medical Record Number:  [id number]    Primary neurologist: Dr. Chalice Reason for visit: CPAP follow-up    SUBJECTIVE:   CHIEF COMPLAINT:  Chief Complaint  Patient presents with   Obstructive Sleep Apnea    Rm 3 alone Pt is well and stable. Reports no OSA/CPAP concerns. Does mention wanting to change mask to nasal.     Follow-up visit:  Prior visit: 05/22/2023  Brief HPI:   Raymond Green is a 58 y.o. male with PMH of ischemic stroke, severe OSA on CPAP, CKD, vitamin D  deficiency, CAD, HLD, HTN and iron deficiency anemia who is followed for OSA on CPAP.  Repeat sleep study 10/2022 confirmed presence of severe sleep apnea with total AHI 39.9/h, REM AHI 59/h and supine AHI 51.8/h with O2 nadir of 83%.  Recommended continuation of AutoPap therapy, set up with new machine 12/2022.   At prior visit with Dr. Chalice, recommended continued compliance with CPAP and initiated Ambien  5 mg nightly as needed for insomnia.     Interval history:  Patient returns for CPAP compliance visit.  Overall doing well with CPAP therapy.  Currently using fullface mask but questions trial of nasal pillow or cradle.  He continues to have occasional issues with insomnia, more so if he wakes in the middle of the night, can have difficulty reinitiating sleep.  Reports improvement with occasional use of Ambien , obtained 30 tablets in 05/2023 and just recently ran out. He is requesting a refill.  ESS 12/24.  Routinely follows with DME [employer] [facility].       ROS:   14 system review of systems performed and negative with exception of those listed in HPI  PMH:  Past Medical History:  Diagnosis Date   Allergy     Anemia    Dysrhythmia    Erosive gastritis 2016   GERD (gastroesophageal reflux disease)    Headache(784.0)     Hemorrhoids    Hyperlipidemia    Hypertension    Kidney insufficiency    LVH (left ventricular hypertrophy)    Dr. Blanca ( cardiology) palpitations   Obesity    OSA (obstructive sleep apnea)    Positive PPD, treated 2000   INH   Recurrent upper respiratory infection (URI)    Sleep apnea    uses cpap   Stroke (HCC)    mimi   Vitamin D  deficiency     PSH:  Past Surgical History:  Procedure Laterality Date   COLONOSCOPY  12/28/2011   Procedure: COLONOSCOPY;  Surgeon: Toribio SHAUNNA Cedar, MD;  Location: [facility];  Service: Endoscopy;  Laterality: N/A;   COLONOSCOPY     HEMORRHOID SURGERY     NASAL SINUS SURGERY Bilateral 10/30/2022   Procedure: ENDOSCOPIC NASAL POLYPECTOMY; FRONTAL ETHMOIDECTOMY AND MAXILLARY SINUS SURGERY;  Surgeon: Jesus Oliphant, MD;  Location: [facility] OR;  Service: ENT;  Laterality: Bilateral;   SEPTOPLASTY Bilateral 07/05/2023   Procedure: SEPTOPLASTY;  Surgeon: Jesus Oliphant, MD;  Location: [facility] OR;  Service: ENT;  Laterality: Bilateral;   UPPER GASTROINTESTINAL ENDOSCOPY      Social History:  Social History   Socioeconomic History   Marital status: Married    Spouse name: Not on file   Number of children: 0   Years of education: Not on file   Highest education level: Not on file  Occupational  History   Occupation: ENDOSCOPY Set Designer: [employer]  Tobacco Use   Smoking status: Never   Smokeless tobacco: Never  Vaping Use   Vaping status: Never Used  Substance and Sexual Activity   Alcohol use: Not Currently    Comment: occasional   Drug use: Never   Sexual activity: Yes  Other Topics Concern   Not on file  Social History Narrative   Married   Education: Automotive Engineer   Exercise: Yes   Social Drivers of Health   Financial Resource Strain: Not on file  Food Insecurity: No Food Insecurity (03/04/2024)   Hunger Vital Sign    Worried About Running Out of Food in the Last Year: Never true    Ran Out of Food in the Last Year: Never true   Transportation Needs: No Transportation Needs (03/04/2024)   PRAPARE - Administrator, Civil Service (Medical): No    Lack of Transportation (Non-Medical): No  Physical Activity: Not on file  Stress: Not on file  Social Connections: Not on file  Intimate Partner Violence: Not At Risk (03/04/2024)   Humiliation, Afraid, Rape, and Kick questionnaire    Fear of Current or Ex-Partner: No    Emotionally Abused: No    Physically Abused: No    Sexually Abused: No    Family History:  Family History  Problem Relation Age of Onset   Diabetes Mother    Heart disease Mother    Hypertension Mother    Hypertension Father    Diabetes Sister    Colon cancer Neg Hx    Colon polyps Neg Hx    Esophageal cancer Neg Hx    Rectal cancer Neg Hx    Stomach cancer Neg Hx    Inflammatory bowel disease Neg Hx    Liver disease Neg Hx    Pancreatic cancer Neg Hx     Medications:   Current Outpatient Medications on File Prior to Visit  Medication Sig Dispense Refill   amLODipine  (NORVASC ) 5 MG tablet Take 1 tablet (5 mg total) by mouth daily. 90 tablet 1   aspirin EC 81 MG tablet Take 81 mg by mouth daily. Swallow whole.     atorvastatin  (LIPITOR) 40 MG tablet Take 1 tablet (40 mg) by mouth daily. 90 tablet 0   cyclobenzaprine  (FLEXERIL ) 5 MG tablet Take 1 tablet (5 mg total) by mouth 3 (three) times daily as needed for muscle spasms (start at bedtime as needed due to sedation). 15 tablet 1   losartan  (COZAAR ) 100 MG tablet Take 1 tablet (100 mg total) by mouth daily. 90 tablet 1   Nebivolol  HCl (BYSTOLIC ) 20 MG TABS Take 1 tablet (20 mg total) by mouth daily. 90 tablet 1   omeprazole  (PRILOSEC) 40 MG capsule Take 1 capsule (40 mg total) by mouth daily. 90 capsule 1   spironolactone  (ALDACTONE ) 25 MG tablet Take 1 tablet (25 mg total) by mouth daily. 90 tablet 3   tamsulosin  (FLOMAX ) 0.4 MG CAPS capsule Take 1 capsule (0.4 mg total) by mouth daily. 30 capsule 3   No current  facility-administered medications on file prior to visit.    Allergies:   Allergies  Allergen Reactions   Lisinopril     angioedema   Shrimp [Shellfish Allergy ] Nausea And Vomiting      OBJECTIVE:  Physical Exam  Vitals:   05/26/24 1536  BP: 136/77  Pulse: (!) 46  Weight: 232 lb (105.2 kg)  Height: 6' (1.829 m)  Body mass index is 31.46 kg/m. No results found.  General: well developed, well nourished, seated, in no evident distress Head: head normocephalic and atraumatic.   Neck: supple with no carotid or supraclavicular bruits Cardiovascular: regular rate and rhythm, no murmurs  Neurologic Exam Mental Status: Awake and fully alert. Oriented to place and time. Recent and remote memory intact. Attention span, concentration and fund of knowledge appropriate. Mood and affect appropriate.  Cranial Nerves: Pupils equal, briskly reactive to light. Extraocular movements full without nystagmus. Visual fields full to confrontation. Hearing intact. Facial sensation intact. Face, tongue, palate moves normally and symmetrically.  Motor: Normal bulk and tone. Normal strength in all tested extremity muscles Gait and Station: Arises from chair without difficulty. Stance is normal. Gait demonstrates normal stride length and balance without use of AD.         ASSESSMENT/PLAN: Raymond Green is a 58 y.o. year old male    OSA on CPAP :  Compliance report shows satisfactory usage with optimal residual AHI.   Continue current pressure settings 5-15 with EPR 3 Discussed continued nightly usage with ensuring greater than 4 hours nightly for optimal benefit and per insurance purposes.   Continue to follow with DME company [employer] for any needed supplies or CPAP related concerns CPAP set up 12/2022  Insomnia: Provided refill for low dose Ambien  5 mg nightly as needed Last filled 05/2023 for 30 day supply      Follow up in 1 year (in office per patient request) or call  earlier if needed   CC:  PCP: Levora Reyes SAUNDERS, MD    I personally spent a total of 25 minutes in the care of the patient today including preparing to see the patient, getting/reviewing separately obtained history, performing a medically appropriate exam/evaluation, counseling and educating, placing orders, and documenting clinical information in the EHR. This is our first time meeting and time has been spent reviewing past medical history and relevant medical records.    Harlene Bogaert, AGNP-BC  [facility] [address] [place], [place] [address]  Phone [phone] Fax [phone] Note: This document was prepared with digital dictation and possible smart phrase technology. Any transcriptional errors that result from this process are unintentional.

## 2024-05-26 ENCOUNTER — Ambulatory Visit: Payer: 59 | Admitting: Adult Health

## 2024-05-26 ENCOUNTER — Encounter: Payer: Self-pay | Admitting: Adult Health

## 2024-05-26 ENCOUNTER — Other Ambulatory Visit (HOSPITAL_COMMUNITY): Payer: Self-pay

## 2024-05-26 ENCOUNTER — Other Ambulatory Visit: Payer: Self-pay

## 2024-05-26 VITALS — BP 136/77 | HR 46 | Ht 72.0 in | Wt 232.0 lb

## 2024-05-26 DIAGNOSIS — G4733 Obstructive sleep apnea (adult) (pediatric): Secondary | ICD-10-CM

## 2024-05-26 DIAGNOSIS — G4700 Insomnia, unspecified: Secondary | ICD-10-CM | POA: Diagnosis not present

## 2024-05-26 MED ORDER — ZOLPIDEM TARTRATE 5 MG PO TABS
5.0000 mg | ORAL_TABLET | Freq: Every evening | ORAL | 0 refills | Status: AC | PRN
  Filled 2024-05-26: qty 30, 30d supply, fill #0

## 2024-05-26 NOTE — Patient Instructions (Signed)
 Your Plan:  Continue nightly use of CPAP with ensuring greater than 4 hours per night for optimal benefit  Continue to follow with [employer] for any needed supplies or CPAP related concerns  You will be contacted by [employer] to try a nasal pillow or cradle  Refill for Ambien  5 mg nightly as needed provided     Follow-up in 1 year or call earlier if needed     Thank you for coming to see us  at [facility]. I hope we have been able to provide you high quality care today.  You may receive a patient satisfaction survey over the next few weeks. We would appreciate your feedback and comments so that we may continue to improve ourselves and the health of our patients.

## 2024-06-02 NOTE — Progress Notes (Signed)
 Cheree Fowles D, CMA  New, Bradley; Ziegler, Melissa; Cain, Leveda Dollar, Dolanda New orders have been placed for the above pt, DOB: 04-Jul-2065 Thanks

## 2024-06-04 ENCOUNTER — Encounter: Payer: Self-pay | Admitting: Family Medicine

## 2024-06-04 ENCOUNTER — Other Ambulatory Visit (HOSPITAL_COMMUNITY): Payer: Self-pay

## 2024-06-04 ENCOUNTER — Ambulatory Visit (INDEPENDENT_AMBULATORY_CARE_PROVIDER_SITE_OTHER): Admitting: Family Medicine

## 2024-06-04 VITALS — BP 128/74 | HR 56 | Temp 97.8°F | Resp 14 | Ht 72.0 in | Wt 233.6 lb

## 2024-06-04 DIAGNOSIS — M545 Low back pain, unspecified: Secondary | ICD-10-CM

## 2024-06-04 DIAGNOSIS — M542 Cervicalgia: Secondary | ICD-10-CM | POA: Diagnosis not present

## 2024-06-04 DIAGNOSIS — M5136 Other intervertebral disc degeneration, lumbar region with discogenic back pain only: Secondary | ICD-10-CM | POA: Diagnosis not present

## 2024-06-04 DIAGNOSIS — M549 Dorsalgia, unspecified: Secondary | ICD-10-CM

## 2024-06-04 DIAGNOSIS — M5030 Other cervical disc degeneration, unspecified cervical region: Secondary | ICD-10-CM

## 2024-06-04 MED ORDER — PREDNISONE 20 MG PO TABS
40.0000 mg | ORAL_TABLET | Freq: Every day | ORAL | 0 refills | Status: AC
  Filled 2024-06-04: qty 10, 5d supply, fill #0

## 2024-06-04 NOTE — Progress Notes (Signed)
 Subjective:  Patient ID: Raymond Green, male    DOB: [birth year]  Age: 58 y.o. MRN: [id number]  CC:  Chief Complaint  Patient presents with   Follow-up    1 month follow up. Would like to discuss xray results.     HPI Raymond Green presents for   Upper back, neck pain: Discussed at his November 17 visit.  Based on symptoms at that time, suspected underlying degenerative disc disease or cervical spine source of his discomfort.  He had reported some intermittent subjective weakness of his arm but I did not appreciate any weakness on exam at that time.  Did have some decreased range of cervical motion.  Given history of CKD deferred NSAIDs but I did start him on a low-dose of Flexeril  at 5 mg nightly initially with gentle range of motion as initial treatment.  C-spine imaging ordered.   C-spine x-ray 05/19/2024, with multiple level degenerative disc changes at C4-5, C5-6, C6-7 but no acute osseous abnormality.  Anterior bridging osteophytosis at C5-6.   Has tried flexeril  at bedtime - some relief, bu still some R low back pain, still sleeping on side. Upper back/neck is better.  Still taking mm. Relaxant. No significant change in R back - hurts to cough, sneeze. No leg radiation. No bowel or bladder incontinence, no saddle anesthesia, no lower extremity weakness.    MRI lumbar spine in 2018: IMPRESSION: 1. Bilateral S1 impingement in the L5-S1 subarticular recesses due to disc bulging and posterior element hypertrophy. 2. Noncompressive degenerative changes at the other levels as described above.         History Patient Active Problem List   Diagnosis Date Noted   Normocytic anemia 11/10/2023   Severe obstructive sleep apnea 05/22/2023   Atherosclerosis of native artery of left lower extremity with intermittent claudication 12/19/2022   Old lacunar stroke without late effect 11/29/2022   Chronic rhinosinusitis with multiple nasal polyps 11/29/2022   Chronic rhinitis  11/23/2020   Deviated septum 11/23/2020   Mild coronary artery disease 02/12/2019   Mixed hyperlipidemia 01/14/2019   Angina pectoris 01/13/2019   Palpitations 01/13/2019   Low back pain 07/13/2016   Abdominal pain, left lower quadrant 05/17/2016   Elevated serum creatinine 05/29/2015   Gout 10/29/2013   Primary hypertension 03/02/2008   GERD 03/02/2008   Iron deficiency anemia 12/13/2007   External hemorrhoids 12/13/2007   Past Medical History:  Diagnosis Date   Allergy     Anemia    Dysrhythmia    Erosive gastritis 2016   GERD (gastroesophageal reflux disease)    Headache(784.0)    Hemorrhoids    Hyperlipidemia    Hypertension    Kidney insufficiency    LVH (left ventricular hypertrophy)    Dr. Blanca ( cardiology) palpitations   Obesity    OSA (obstructive sleep apnea)    Positive PPD, treated 2000   INH   Recurrent upper respiratory infection (URI)    Sleep apnea    uses cpap   Stroke (HCC)    mimi   Vitamin D  deficiency    Past Surgical History:  Procedure Laterality Date   COLONOSCOPY  12/28/2011   Procedure: COLONOSCOPY;  Surgeon: Toribio SHAUNNA Cedar, MD;  Location: [facility];  Service: Endoscopy;  Laterality: N/A;   COLONOSCOPY     HEMORRHOID SURGERY     NASAL SINUS SURGERY Bilateral 10/30/2022   Procedure: ENDOSCOPIC NASAL POLYPECTOMY; FRONTAL ETHMOIDECTOMY AND MAXILLARY SINUS SURGERY;  Surgeon: Jesus Oliphant, MD;  Location: [facility] OR;  Service: ENT;  Laterality: Bilateral;   SEPTOPLASTY Bilateral 07/05/2023   Procedure: SEPTOPLASTY;  Surgeon: Jesus Oliphant, MD;  Location: [facility] OR;  Service: ENT;  Laterality: Bilateral;   UPPER GASTROINTESTINAL ENDOSCOPY     Allergies[1] Prior to Admission medications  Medication Sig Start Date End Date Taking? Authorizing Provider  amLODipine  (NORVASC ) 5 MG tablet Take 1 tablet (5 mg total) by mouth daily. 10/29/23  Yes Levora Reyes SAUNDERS, MD  aspirin EC 81 MG tablet Take 81 mg by mouth daily. Swallow whole.   Yes [provider]  atorvastatin  (LIPITOR) 40 MG tablet Take 1 tablet (40 mg) by mouth daily. 03/26/24  Yes Levora Reyes SAUNDERS, MD  cyclobenzaprine  (FLEXERIL ) 5 MG tablet Take 1 tablet (5 mg total) by mouth 3 (three) times daily as needed for muscle spasms (start at bedtime as needed due to sedation). 05/05/24  Yes Levora Reyes SAUNDERS, MD  losartan  (COZAAR ) 100 MG tablet Take 1 tablet (100 mg total) by mouth daily. 10/29/23  Yes Levora Reyes SAUNDERS, MD  Nebivolol  HCl (BYSTOLIC ) 20 MG TABS Take 1 tablet (20 mg total) by mouth daily. 10/29/23  Yes Levora Reyes SAUNDERS, MD  omeprazole  (PRILOSEC) 40 MG capsule Take 1 capsule (40 mg total) by mouth daily. 10/29/23  Yes Levora Reyes SAUNDERS, MD  spironolactone  (ALDACTONE ) 25 MG tablet Take 1 tablet (25 mg total) by mouth daily. 10/29/23  Yes   tamsulosin  (FLOMAX ) 0.4 MG CAPS capsule Take 1 capsule (0.4 mg total) by mouth daily. 11/05/23  Yes Levora Reyes SAUNDERS, MD  zolpidem  (AMBIEN ) 5 MG tablet Take 1 tablet (5 mg total) by mouth at bedtime as needed for sleep. 05/26/24  Yes Whitfield Raisin, NP   Social History   Socioeconomic History   Marital status: Married    Spouse name: Not on file   Number of children: 0   Years of education: Not on file   Highest education level: Not on file  Occupational History   Occupation: ENDOSCOPY TECHNICIAN    Employer:   Tobacco Use   Smoking status: Never   Smokeless tobacco: Never  Vaping Use   Vaping status: Never Used  Substance and Sexual Activity   Alcohol use: Not Currently    Comment: occasional   Drug use: Never   Sexual activity: Yes  Other Topics Concern   Not on file  Social History Narrative   Married   Education: Automotive Engineer   Exercise: Yes   Social Drivers of Health   Tobacco Use: Low Risk (06/04/2024)   Patient History    Smoking Tobacco Use: Never    Smokeless Tobacco Use: Never    Passive Exposure: Not on file  Financial Resource Strain: Not on file  Food Insecurity: No Food Insecurity  (03/04/2024)   Epic    Worried About Programme Researcher, Broadcasting/film/video in the Last Year: Never true    Ran Out of Food in the Last Year: Never true  Transportation Needs: No Transportation Needs (03/04/2024)   Epic    Lack of Transportation (Medical): No    Lack of Transportation (Non-Medical): No  Physical Activity: Not on file  Stress: Not on file  Social Connections: Not on file  Intimate Partner Violence: Not At Risk (03/04/2024)   Epic    Fear of Current or Ex-Partner: No    Emotionally Abused: No    Physically Abused: No    Sexually Abused: No  Depression (PHQ2-9): Low Risk (03/04/2024)   Depression (PHQ2-9)    PHQ-2 Score:  0  Alcohol Screen: Not on file  Housing: Unknown (03/04/2024)   Epic    Unable to Pay for Housing in the Last Year: No    Number of Times Moved in the Last Year: Not on file    Homeless in the Last Year: No  Utilities: Not At Risk (03/04/2024)   Epic    Threatened with loss of utilities: No  Health Literacy: Not on file    Review of Systems   Objective:   Vitals:   06/04/24 1549  BP: 128/74  Pulse: (!) 56  Resp: 14  Temp: 97.8 F (36.6 C)  TempSrc: Temporal  SpO2: 97%  Weight: 233 lb 9.6 oz (106 kg)  Height: 6' (1.829 m)     Physical Exam Constitutional:      General: He is not in acute distress.    Appearance: Normal appearance. He is well-developed.  HENT:     Head: Normocephalic and atraumatic.  Cardiovascular:     Rate and Rhythm: Normal rate.  Pulmonary:     Effort: Pulmonary effort is normal.  Musculoskeletal:     Comments: Lumbar spine, no midline bony tenderness.  Discomfort over the paraspinals of the right lower paraspinal musculature.  Negative seated straight leg raise, ambulating without assistive device.  Neurological:     Mental Status: He is alert and oriented to person, place, and time.  Psychiatric:        Mood and Affect: Mood normal.        Assessment & Plan:  Raymond Green is a 58 y.o. male . Right-sided low  back pain without sciatica, unspecified chronicity - Plan: predniSONE  (DELTASONE ) 20 MG tablet  Neck pain - Plan: predniSONE  (DELTASONE ) 20 MG tablet  Upper back pain - Plan: predniSONE  (DELTASONE ) 20 MG tablet  DDD (degenerative disc disease), cervical - Plan: predniSONE  (DELTASONE ) 20 MG tablet  Degeneration of intervertebral disc of lumbar region with discogenic back pain - Plan: predniSONE  (DELTASONE ) 20 MG tablet  Cervical symptoms have improved.  MRI from approximately 7 years ago did indicate some degenerative disease of his lumbar spine, suspect that is contributing to his right sided lower back symptoms.  Given persistent symptoms in spite of muscle relaxant, and concerns with NSAID above we will try short course of prednisone  40 mg daily x 5 days with potential side effects and risks discussed.  If back symptoms are not improved with this treatment would recommend imaging and then follow-up with spine specialist to determine if PT or advanced imaging indicated.  RTC precautions if new or worsening symptoms.  Meds ordered this encounter  Medications   predniSONE  (DELTASONE ) 20 MG tablet    Sig: Take 2 tablets (40 mg total) by mouth daily with breakfast.    Dispense:  10 tablet    Refill:  0   Patient Instructions  As we discussed I think you could have a pinched nerve or pain due to the degenerative disc disease.  That was seen in your cervical spine and previously in the lumbar spine MRI.  I suspect that is what is causing your right low back pain.  I am glad to hear that the upper back and neck has improved with a muscle relaxant but we can try prednisone  2 pills/day for 5 days to see if that will help the low back.  If that does not help, let me know and I will order some imaging and refer you to a spine specialist.  If any new or worsening  symptoms be seen.  Take care!    Signed,   Reyes Pines, MD [facility], [facility] 06/04/2024 4:27 PM      [1]  Allergies Allergen Reactions   Lisinopril     angioedema   Shrimp [Shellfish Allergy ] Nausea And Vomiting

## 2024-06-04 NOTE — Patient Instructions (Signed)
 As we discussed I think you could have a pinched nerve or pain due to the degenerative disc disease.  That was seen in your cervical spine and previously in the lumbar spine MRI.  I suspect that is what is causing your right low back pain.  I am glad to hear that the upper back and neck has improved with a muscle relaxant but we can try prednisone  2 pills/day for 5 days to see if that will help the low back.  If that does not help, let me know and I will order some imaging and refer you to a spine specialist.  If any new or worsening symptoms be seen.  Take care!

## 2024-06-05 ENCOUNTER — Other Ambulatory Visit: Payer: Self-pay

## 2024-06-27 ENCOUNTER — Other Ambulatory Visit: Payer: Self-pay | Admitting: Oncology

## 2024-06-27 DIAGNOSIS — D649 Anemia, unspecified: Secondary | ICD-10-CM

## 2024-07-01 ENCOUNTER — Encounter: Payer: Self-pay | Admitting: Oncology

## 2024-07-01 ENCOUNTER — Inpatient Hospital Stay: Attending: Oncology

## 2024-07-01 ENCOUNTER — Ambulatory Visit: Admitting: Oncology

## 2024-07-01 ENCOUNTER — Inpatient Hospital Stay: Admitting: Oncology

## 2024-07-01 ENCOUNTER — Other Ambulatory Visit

## 2024-07-01 VITALS — HR 51 | Temp 98.3°F | Resp 16 | Ht 72.0 in | Wt 234.3 lb

## 2024-07-01 DIAGNOSIS — D649 Anemia, unspecified: Secondary | ICD-10-CM

## 2024-07-01 DIAGNOSIS — D631 Anemia in chronic kidney disease: Secondary | ICD-10-CM | POA: Diagnosis present

## 2024-07-01 DIAGNOSIS — N183 Chronic kidney disease, stage 3 unspecified: Secondary | ICD-10-CM | POA: Insufficient documentation

## 2024-07-01 LAB — CBC WITH DIFFERENTIAL (CANCER CENTER ONLY)
Abs Immature Granulocytes: 0.02 K/uL (ref 0.00–0.07)
Basophils Absolute: 0 K/uL (ref 0.0–0.1)
Basophils Relative: 1 %
Eosinophils Absolute: 0.3 K/uL (ref 0.0–0.5)
Eosinophils Relative: 6 %
HCT: 37.8 % — ABNORMAL LOW (ref 39.0–52.0)
Hemoglobin: 12.7 g/dL — ABNORMAL LOW (ref 13.0–17.0)
Immature Granulocytes: 0 %
Lymphocytes Relative: 28 %
Lymphs Abs: 1.6 K/uL (ref 0.7–4.0)
MCH: 29.9 pg (ref 26.0–34.0)
MCHC: 33.6 g/dL (ref 30.0–36.0)
MCV: 88.9 fL (ref 80.0–100.0)
Monocytes Absolute: 0.4 K/uL (ref 0.1–1.0)
Monocytes Relative: 8 %
Neutro Abs: 3.4 K/uL (ref 1.7–7.7)
Neutrophils Relative %: 57 %
Platelet Count: 153 K/uL (ref 150–400)
RBC: 4.25 MIL/uL (ref 4.22–5.81)
RDW: 13.2 % (ref 11.5–15.5)
WBC Count: 5.9 K/uL (ref 4.0–10.5)
nRBC: 0 % (ref 0.0–0.2)

## 2024-07-01 LAB — CMP (CANCER CENTER ONLY)
ALT: 16 U/L (ref 0–44)
AST: 26 U/L (ref 15–41)
Albumin: 4.4 g/dL (ref 3.5–5.0)
Alkaline Phosphatase: 80 U/L (ref 38–126)
Anion gap: 10 (ref 5–15)
BUN: 17 mg/dL (ref 6–20)
CO2: 27 mmol/L (ref 22–32)
Calcium: 10.7 mg/dL — ABNORMAL HIGH (ref 8.9–10.3)
Chloride: 102 mmol/L (ref 98–111)
Creatinine: 1.64 mg/dL — ABNORMAL HIGH (ref 0.61–1.24)
GFR, Estimated: 48 mL/min — ABNORMAL LOW
Glucose, Bld: 96 mg/dL (ref 70–99)
Potassium: 4.1 mmol/L (ref 3.5–5.1)
Sodium: 140 mmol/L (ref 135–145)
Total Bilirubin: 0.6 mg/dL (ref 0.0–1.2)
Total Protein: 7.9 g/dL (ref 6.5–8.1)

## 2024-07-01 LAB — FERRITIN: Ferritin: 68 ng/mL (ref 24–336)

## 2024-07-01 LAB — IRON AND TIBC
Iron: 49 ug/dL (ref 45–182)
Saturation Ratios: 16 % — ABNORMAL LOW (ref 17.9–39.5)
TIBC: 314 ug/dL (ref 250–450)
UIBC: 265 ug/dL

## 2024-07-01 NOTE — Assessment & Plan Note (Addendum)
 Anemia has been present since at least 2015 with fluctuating hemoglobin levels, currently around 11.8 to 12.3 g/dL.   Previous workup including iron and B12 levels were normal. No obvious sources of blood loss identified.   Chronic kidney disease stage 3 may contribute to anemia due to reduced erythropoietin production.    On his consultation with us  on 03/04/2024, labs showed stable hemoglobin of 12.5, hematocrit 36.9, MCV 90.4.  White count 3700 with normal differential.  Platelet count 148,000.  Creatinine 1.68, otherwise unremarkable CMP.  TSH, LDH were within normal limits.  Methylmalonic acid, haptoglobin were within normal limits.  Coombs test negative.  SPEP showed no evidence of M spike.  IFE was unremarkable.  Quantitative immunoglobulins were within normal limits.  Serum free kappa was slightly increased at 35.9 mg/L, lambda was 24.6 mg/L, ratio normal at 1.46.  Overall no evidence of monoclonal gammopathy.  ANA negative. Hemoglobin electrophoresis was unremarkable.   Labs today showed stable hemoglobin of 12.7, MCV 88.9.  White count and platelet count are within normal limits.  Creatinine fluctuating and it is 1.64 today.  Ferritin normal at 68.   Clinical picture is indicative of anemia of chronic disease related to CKD. Erythropoietin injections are considered if hemoglobin consistently falls below 9 g/dL.    No intervention needed at current values.  He was provided reassurance.    RTC in 6 months for follow-up with repeat labs.

## 2024-07-01 NOTE — Progress Notes (Signed)
 "  [facility]  HEMATOLOGY CLINIC PROGRESS NOTE  PATIENT NAME: [facility]   MR#: [id number] DOB: [birth year]  Patient Care Team: Levora Reyes SAUNDERS, MD as PCP - General (Family Medicine) Starla Laymon BIRCH PA-C as Physician Assistant (Physician Assistant) Jesus Oliphant, MD as Consulting Physician (Otolaryngology) Gearline Norris, MD as Consulting Physician (Nephrology) Mansouraty, Aloha Raddle., MD as Consulting Physician (Gastroenterology)  Date of visit: 07/01/2024   ASSESSMENT & PLAN:   [facility] is a 59 y.o. gentleman with a past medical history of hypertension, dyslipidemia, CKD stage III, sleep apnea, was referred to our service in September 2025 for evaluation of normocytic anemia. Workup indicative of anemia of chronic disease, related to CKD.  Normocytic anemia Anemia has been present since at least 2015 with fluctuating hemoglobin levels, currently around 11.8 to 12.3 g/dL.   Previous workup including iron and B12 levels were normal. No obvious sources of blood loss identified.   Chronic kidney disease stage 3 may contribute to anemia due to reduced erythropoietin production.    On his consultation with us  on 03/04/2024, labs showed stable hemoglobin of 12.5, hematocrit 36.9, MCV 90.4.  White count 3700 with normal differential.  Platelet count 148,000.  Creatinine 1.68, otherwise unremarkable CMP.  TSH, LDH were within normal limits.  Methylmalonic acid, haptoglobin were within normal limits.  Coombs test negative.  SPEP showed no evidence of M spike.  IFE was unremarkable.  Quantitative immunoglobulins were within normal limits.  Serum free kappa was slightly increased at 35.9 mg/L, lambda was 24.6 mg/L, ratio normal at 1.46.  Overall no evidence of monoclonal gammopathy.  ANA negative. Hemoglobin electrophoresis was unremarkable.   Labs today showed stable hemoglobin of 12.7, MCV 88.9.  White count and platelet count are within normal limits.   Creatinine fluctuating and it is 1.64 today.  Ferritin normal at 68.   Clinical picture is indicative of anemia of chronic disease related to CKD. Erythropoietin injections are considered if hemoglobin consistently falls below 9 g/dL.    No intervention needed at current values.  He was provided reassurance.    RTC in 6 months for follow-up with repeat labs.   I spent a total of 22 minutes during this encounter with the patient including review of chart and various tests results, discussions about plan of care and coordination of care plan.  I reviewed lab results and outside records for this visit and discussed relevant results with the patient. Diagnosis, plan of care and treatment options were also discussed in detail with the patient. Opportunity provided to ask questions and answers provided to his apparent satisfaction. Provided instructions to call our clinic with any problems, questions or concerns prior to return visit. I recommended to continue follow-up with PCP and sub-specialists. He verbalized understanding and agreed with the plan. No barriers to learning was detected.  Chinita Patten, MD  07/01/2024 4:57 PM  [facility] - A DEPT OF [facility] [address] [place] [address] Dept: [phone] Dept Fax: [phone]   CHIEF COMPLAINT/ REASON FOR VISIT:  Follow-up for normocytic anemia.  Workup indicative of anemia of chronic disease, related to CKD.  INTERVAL HISTORY:  Discussed the use of AI scribe software for clinical note transcription with the patient, who gave verbal consent to proceed.  History of Present Illness [facility] is a 59 year old male with chronic anemia of chronic disease, likely secondary to mild renal dysfunction, presenting  for routine hematology follow-up.  He is seen for follow-up of anemia, last evaluated approximately four months ago. During this  interval, he has not experienced chest pain, dyspnea, or other major health concerns. He maintains adequate hydration and has not undergone laboratory testing outside of this clinic.  Recent laboratory evaluation shows hemoglobin improved from 12.5 g/dL to 59.2 g/dL. White blood cell count, previously mildly decreased at 3,700, has normalized to 5,900. Platelet count remains stable at 153,000. Prior iron studies did not demonstrate iron deficiency. Additional workup, including ANA, hemoglobin electrophoresis, and myeloma screening, was unremarkable. Kidney function and iron studies were repeated today, with prior results not indicating significant abnormalities.   SUMMARY OF HEMATOLOGIC HISTORY:  He was referred by Dr. Wilhelmenia for further evaluation of anemia.   On 01/10/2024, labs showed hemoglobin of 11.9, hematocrit 37, MCV 95.  White count 4600 with normal differential.  Platelet count normal at 168,000.  Iron saturation was 30%, normal iron studies.  Ferritin normal at 58.  B12 was normal at ferritin 84, folate normal at 7.6.  Patient was referred to us  for further evaluation of normocytic anemia.   Review of records indicate chronic, mild anemia at least since January 2025 with hemoglobin at 12, MCV normal.  Iron studies, B12 were normal in June 2025.   His last colonoscopy in July 2023 showed normal colon without evidence of polyps or other concerning findings.  Because of hematochezia, he had flexible sigmoidoscopy in April 2024 which showed external and internal hemorrhoids.  Normal mucosa in the rectosigmoid colon, sigmoid colon and descending colon.   He has a history of chronic anemia with fluctuating hemoglobin levels since at least 2015. His hemoglobin was mildly low at 10.2 g/dL in May 2024, with recent values of 12.3 g/dL and 59.0 g/dL in July. Despite normal iron and B12 levels, the cause of his anemia remains unexplained. No obvious blood loss, such as epistaxis or gum bleeding, is  reported. He has undergone a colonoscopy and flexible sigmoidoscopy, which were unremarkable except for hemorrhoids without bleeding.   He follows a regular diet and denies any unusual cravings such as for ice chips. He does not take over-the-counter medications like Aleve  or Advil due to his kidney condition, using only Tylenol  for pain management. No blood in urine or burning during urination is noted, although he describes his urine as 'great for me.' He has not been on iron or vitamin supplements.   He has chronic kidney disease, stage 3, and sees a nephrologist for management. No symptoms such as chest pain, shortness of breath with activity, dizziness, or palpitations are present.   Socially, he has been living in [place] for 25 years and is originally from [place]. He works at the [employer] (with Dr. Wilhelmenia). He consumes alcohol occasionally, not regularly.   Anemia has been present since at least 2015 with fluctuating hemoglobin levels, currently around 11.8 to 12.3 g/dL.    Previous workup including iron and B12 levels were normal. No obvious sources of blood loss identified.    Chronic kidney disease stage 3 may contribute to anemia due to reduced erythropoietin production.    On his consultation with us  on 03/04/2024, labs showed stable hemoglobin of 12.5, hematocrit 36.9, MCV 90.4.  White count 3700 with normal differential.  Platelet count 148,000.  Creatinine 1.68, otherwise unremarkable CMP.  TSH, LDH were within normal limits.  Methylmalonic acid, haptoglobin were within normal limits.  Coombs test negative.  SPEP showed no evidence  of M spike.  IFE was unremarkable.  Quantitative immunoglobulins were within normal limits.  Serum free kappa was slightly increased at 35.9 mg/L, lambda was 24.6 mg/L, ratio normal at 1.46.  Overall no evidence of monoclonal gammopathy.  ANA negative. Hemoglobin electrophoresis was unremarkable.    Clinical picture is indicative of  anemia of chronic disease related to CKD. Erythropoietin injections are considered if hemoglobin consistently falls below 9 g/dL.    No intervention needed at current values.  He was provided reassurance.    I have reviewed the past medical history, past surgical history, social history and family history with the patient and they are unchanged from previous note.  ALLERGIES: He is allergic to lisinopril and shrimp [shellfish allergy ].  MEDICATIONS:  Current Outpatient Medications  Medication Sig Dispense Refill   amLODipine  (NORVASC ) 5 MG tablet Take 1 tablet (5 mg total) by mouth daily. 90 tablet 1   aspirin EC 81 MG tablet Take 81 mg by mouth daily. Swallow whole.     atorvastatin  (LIPITOR) 40 MG tablet Take 1 tablet (40 mg) by mouth daily. 90 tablet 0   cyclobenzaprine  (FLEXERIL ) 5 MG tablet Take 1 tablet (5 mg total) by mouth 3 (three) times daily as needed for muscle spasms (start at bedtime as needed due to sedation). 15 tablet 1   losartan  (COZAAR ) 100 MG tablet Take 1 tablet (100 mg total) by mouth daily. 90 tablet 1   Nebivolol  HCl (BYSTOLIC ) 20 MG TABS Take 1 tablet (20 mg total) by mouth daily. 90 tablet 1   omeprazole  (PRILOSEC) 40 MG capsule Take 1 capsule (40 mg total) by mouth daily. 90 capsule 1   predniSONE  (DELTASONE ) 20 MG tablet Take 2 tablets (40 mg total) by mouth daily with breakfast. 10 tablet 0   spironolactone  (ALDACTONE ) 25 MG tablet Take 1 tablet (25 mg total) by mouth daily. 90 tablet 3   tamsulosin  (FLOMAX ) 0.4 MG CAPS capsule Take 1 capsule (0.4 mg total) by mouth daily. 30 capsule 3   zolpidem  (AMBIEN ) 5 MG tablet Take 1 tablet (5 mg total) by mouth at bedtime as needed for sleep. 30 tablet 0   No current facility-administered medications for this visit.     REVIEW OF SYSTEMS:    Review of Systems - Oncology  All other pertinent systems were reviewed with the patient and are negative.  PHYSICAL EXAMINATION:   Onc Performance Status - 07/01/24 1526        ECOG Perf Status   ECOG Perf Status Restricted in physically strenuous activity but ambulatory and able to carry out work of a light or sedentary nature, e.g., light house work, office work      KPS SCALE   KPS % SCORE Normal activity with effort, some s/s of disease          Vitals:   07/01/24 1518  Pulse: (!) 51  Resp: 16  Temp: 98.3 F (36.8 C)  SpO2: 100%   Filed Weights   07/01/24 1518  Weight: 234 lb 4.8 oz (106.3 kg)    Physical Exam Constitutional:      General: He is not in acute distress.    Appearance: Normal appearance.  HENT:     Head: Normocephalic and atraumatic.  Eyes:     Conjunctiva/sclera: Conjunctivae normal.  Cardiovascular:     Rate and Rhythm: Normal rate and regular rhythm.  Pulmonary:     Effort: Pulmonary effort is normal. No respiratory distress.  Abdominal:     General:  There is no distension.  Neurological:     General: No focal deficit present.     Mental Status: He is alert and oriented to person, place, and time.  Psychiatric:        Mood and Affect: Mood normal.        Behavior: Behavior normal.     LABORATORY DATA:   I have reviewed the data as listed.  Results for orders placed or performed in visit on 07/01/24  Ferritin  Result Value Ref Range   Ferritin 68 24 - 336 ng/mL  CMP (Cancer Center only)  Result Value Ref Range   Sodium 140 135 - 145 mmol/L   Potassium 4.1 3.5 - 5.1 mmol/L   Chloride 102 98 - 111 mmol/L   CO2 27 22 - 32 mmol/L   Glucose, Bld 96 70 - 99 mg/dL   BUN 17 6 - 20 mg/dL   Creatinine 8.35 (H) 9.38 - 1.24 mg/dL   Calcium  10.7 (H) 8.9 - 10.3 mg/dL   Total Protein 7.9 6.5 - 8.1 g/dL   Albumin 4.4 3.5 - 5.0 g/dL   AST 26 15 - 41 U/L   ALT 16 0 - 44 U/L   Alkaline Phosphatase 80 38 - 126 U/L   Total Bilirubin 0.6 0.0 - 1.2 mg/dL   GFR, Estimated 48 (L) >60 mL/min   Anion gap 10 5 - 15  CBC with Differential (Cancer Center Only)  Result Value Ref Range   WBC Count 5.9 4.0 - 10.5 K/uL    RBC 4.25 4.22 - 5.81 MIL/uL   Hemoglobin 12.7 (L) 13.0 - 17.0 g/dL   HCT 62.1 (L) 60.9 - 47.9 %   MCV 88.9 80.0 - 100.0 fL   MCH 29.9 26.0 - 34.0 pg   MCHC 33.6 30.0 - 36.0 g/dL   RDW 86.7 88.4 - 84.4 %   Platelet Count 153 150 - 400 K/uL   nRBC 0.0 0.0 - 0.2 %   Neutrophils Relative % 57 %   Neutro Abs 3.4 1.7 - 7.7 K/uL   Lymphocytes Relative 28 %   Lymphs Abs 1.6 0.7 - 4.0 K/uL   Monocytes Relative 8 %   Monocytes Absolute 0.4 0.1 - 1.0 K/uL   Eosinophils Relative 6 %   Eosinophils Absolute 0.3 0.0 - 0.5 K/uL   Basophils Relative 1 %   Basophils Absolute 0.0 0.0 - 0.1 K/uL   Immature Granulocytes 0 %   Abs Immature Granulocytes 0.02 0.00 - 0.07 K/uL    RADIOGRAPHIC STUDIES:  No recent pertinent imaging studies available to review.  Orders Placed This Encounter  Procedures   CBC with Differential (Cancer Center Only)    Standing Status:   Future    Expiration Date:   07/01/2025   CMP (Cancer Center only)    Standing Status:   Future    Expiration Date:   07/01/2025   Iron and TIBC    Standing Status:   Future    Expiration Date:   07/01/2025   Ferritin    Standing Status:   Future    Expiration Date:   07/01/2025     Future Appointments  Date Time Provider Department Center  12/29/2024  3:15 PM [facility] PHLEBOTOMIST [facility] None  12/29/2024  3:30 PM Chanz Cahall, Chinita, MD [facility] None  06/01/2025  3:45 PM Whitfield Raisin, NP [facility] None     This document was completed utilizing speech recognition software. Grammatical errors, random word insertions, pronoun errors, and incomplete sentences are  an occasional consequence of this system due to software limitations, ambient noise, and hardware issues. Any formal questions or concerns about the content, text or information contained within the body of this dictation should be directly addressed to the provider for clarification.  "

## 2024-07-08 ENCOUNTER — Other Ambulatory Visit: Payer: Self-pay

## 2024-07-08 ENCOUNTER — Other Ambulatory Visit: Payer: Self-pay | Admitting: Family Medicine

## 2024-07-08 ENCOUNTER — Other Ambulatory Visit (HOSPITAL_COMMUNITY): Payer: Self-pay

## 2024-07-08 DIAGNOSIS — K219 Gastro-esophageal reflux disease without esophagitis: Secondary | ICD-10-CM

## 2024-07-08 DIAGNOSIS — I10 Essential (primary) hypertension: Secondary | ICD-10-CM

## 2024-07-08 MED ORDER — ATORVASTATIN CALCIUM 40 MG PO TABS
40.0000 mg | ORAL_TABLET | Freq: Every day | ORAL | 0 refills | Status: AC
  Filled 2024-07-08: qty 90, 90d supply, fill #0

## 2024-07-08 MED ORDER — LOSARTAN POTASSIUM 100 MG PO TABS
100.0000 mg | ORAL_TABLET | Freq: Every day | ORAL | 1 refills | Status: AC
  Filled 2024-07-08: qty 90, 90d supply, fill #0

## 2024-07-08 MED ORDER — NEBIVOLOL HCL 20 MG PO TABS
1.0000 | ORAL_TABLET | Freq: Every day | ORAL | 1 refills | Status: AC
  Filled 2024-07-08: qty 90, 90d supply, fill #0

## 2024-07-08 MED ORDER — OMEPRAZOLE 40 MG PO CPDR
40.0000 mg | DELAYED_RELEASE_CAPSULE | Freq: Every day | ORAL | 1 refills | Status: AC
  Filled 2024-07-08: qty 90, 90d supply, fill #0

## 2024-07-08 MED ORDER — AMLODIPINE BESYLATE 5 MG PO TABS
5.0000 mg | ORAL_TABLET | Freq: Every day | ORAL | 1 refills | Status: AC
  Filled 2024-07-08: qty 90, 90d supply, fill #0

## 2024-07-13 ENCOUNTER — Other Ambulatory Visit (HOSPITAL_COMMUNITY): Payer: Self-pay

## 2024-07-13 MED ORDER — RENA-VITE PO TABS
1.0000 | ORAL_TABLET | Freq: Every day | ORAL | 1 refills | Status: AC
  Filled 2024-07-13: qty 90, 90d supply, fill #0

## 2024-07-14 ENCOUNTER — Other Ambulatory Visit (HOSPITAL_COMMUNITY): Payer: Self-pay

## 2024-07-15 ENCOUNTER — Other Ambulatory Visit: Payer: Self-pay

## 2024-12-29 ENCOUNTER — Inpatient Hospital Stay: Admitting: Oncology

## 2024-12-29 ENCOUNTER — Inpatient Hospital Stay

## 2025-06-01 ENCOUNTER — Ambulatory Visit: Admitting: Adult Health
